# Patient Record
Sex: Female | Born: 1970 | Race: Black or African American | Hispanic: No | Marital: Married | Smoking: Former smoker
Health system: Southern US, Community
[De-identification: ages and names within clinical notes are randomized; demographics above are authoritative.]

## PROBLEM LIST (undated history)

## (undated) ENCOUNTER — Encounter

## (undated) ENCOUNTER — Ambulatory Visit

## (undated) ENCOUNTER — Encounter: Attending: Infectious Disease | Primary: Infectious Disease

## (undated) ENCOUNTER — Ambulatory Visit: Payer: MEDICARE

## (undated) ENCOUNTER — Ambulatory Visit: Payer: Medicare (Managed Care) | Attending: Registered" | Primary: Registered"

## (undated) ENCOUNTER — Encounter: Attending: Registered" | Primary: Registered"

## (undated) ENCOUNTER — Encounter
Attending: Pharmacist Clinician (PhC)/ Clinical Pharmacy Specialist | Primary: Pharmacist Clinician (PhC)/ Clinical Pharmacy Specialist

## (undated) ENCOUNTER — Ambulatory Visit: Payer: Medicare (Managed Care)

## (undated) ENCOUNTER — Telehealth

## (undated) ENCOUNTER — Encounter: Payer: Medicare (Managed Care) | Attending: Neurology | Primary: Neurology

## (undated) ENCOUNTER — Ambulatory Visit: Payer: BLUE CROSS/BLUE SHIELD

## (undated) ENCOUNTER — Inpatient Hospital Stay: Payer: Medicare (Managed Care)

## (undated) ENCOUNTER — Encounter: Attending: Neurology | Primary: Neurology

## (undated) ENCOUNTER — Ambulatory Visit: Payer: Medicare (Managed Care) | Attending: Sports Medicine | Primary: Sports Medicine

## (undated) ENCOUNTER — Ambulatory Visit: Attending: Sports Medicine | Primary: Sports Medicine

## (undated) ENCOUNTER — Ambulatory Visit: Payer: Medicare (Managed Care) | Attending: Infectious Disease | Primary: Infectious Disease

## (undated) ENCOUNTER — Ambulatory Visit: Payer: BLUE CROSS/BLUE SHIELD | Attending: Family Medicine | Primary: Family Medicine

## (undated) ENCOUNTER — Encounter: Attending: Sports Medicine | Primary: Sports Medicine

## (undated) ENCOUNTER — Ambulatory Visit
Payer: Medicare (Managed Care) | Attending: Pharmacist Clinician (PhC)/ Clinical Pharmacy Specialist | Primary: Pharmacist Clinician (PhC)/ Clinical Pharmacy Specialist

## (undated) ENCOUNTER — Telehealth: Attending: Registered" | Primary: Registered"

## (undated) ENCOUNTER — Ambulatory Visit: Payer: BLUE CROSS/BLUE SHIELD | Attending: Family | Primary: Family

## (undated) ENCOUNTER — Ambulatory Visit: Attending: Family | Primary: Family

## (undated) DIAGNOSIS — Z21 Asymptomatic human immunodeficiency virus [HIV] infection status: Secondary | ICD-10-CM

## (undated) DIAGNOSIS — K859 Acute pancreatitis without necrosis or infection, unspecified: Secondary | ICD-10-CM

## (undated) DIAGNOSIS — F319 Bipolar disorder, unspecified: Secondary | ICD-10-CM

## (undated) DIAGNOSIS — C801 Malignant (primary) neoplasm, unspecified: Secondary | ICD-10-CM

## (undated) DIAGNOSIS — J302 Other seasonal allergic rhinitis: Secondary | ICD-10-CM

## (undated) DIAGNOSIS — F419 Anxiety disorder, unspecified: Secondary | ICD-10-CM

## (undated) DIAGNOSIS — K219 Gastro-esophageal reflux disease without esophagitis: Secondary | ICD-10-CM

## (undated) DIAGNOSIS — R519 Headache, unspecified: Secondary | ICD-10-CM

## (undated) DIAGNOSIS — M1990 Unspecified osteoarthritis, unspecified site: Secondary | ICD-10-CM

## (undated) DIAGNOSIS — D649 Anemia, unspecified: Secondary | ICD-10-CM

## (undated) DIAGNOSIS — K759 Inflammatory liver disease, unspecified: Secondary | ICD-10-CM

## (undated) DIAGNOSIS — G709 Myoneural disorder, unspecified: Secondary | ICD-10-CM

## (undated) DIAGNOSIS — G43909 Migraine, unspecified, not intractable, without status migrainosus: Secondary | ICD-10-CM

## (undated) DIAGNOSIS — R0600 Dyspnea, unspecified: Secondary | ICD-10-CM

## (undated) DIAGNOSIS — L309 Dermatitis, unspecified: Secondary | ICD-10-CM

## (undated) DIAGNOSIS — R51 Headache: Secondary | ICD-10-CM

## (undated) DIAGNOSIS — N809 Endometriosis, unspecified: Secondary | ICD-10-CM

## (undated) DIAGNOSIS — D219 Benign neoplasm of connective and other soft tissue, unspecified: Secondary | ICD-10-CM

## (undated) DIAGNOSIS — F329 Major depressive disorder, single episode, unspecified: Secondary | ICD-10-CM

## (undated) DIAGNOSIS — F32A Depression, unspecified: Secondary | ICD-10-CM

## (undated) DIAGNOSIS — J45909 Unspecified asthma, uncomplicated: Secondary | ICD-10-CM

## (undated) DIAGNOSIS — I10 Essential (primary) hypertension: Secondary | ICD-10-CM

## (undated) DIAGNOSIS — B20 Human immunodeficiency virus [HIV] disease: Secondary | ICD-10-CM

## (undated) HISTORY — PX: DILATION AND CURETTAGE OF UTERUS: SHX78

## (undated) HISTORY — DX: Dermatitis, unspecified: L30.9

## (undated) HISTORY — PX: OTHER SURGICAL HISTORY: SHX169

## (undated) HISTORY — PX: ABDOMINAL HYSTERECTOMY: SHX81

## (undated) HISTORY — DX: Asymptomatic human immunodeficiency virus (hiv) infection status: Z21

## (undated) HISTORY — PX: HAND SURGERY: SHX662

## (undated) HISTORY — PX: WISDOM TOOTH EXTRACTION: SHX21

## (undated) HISTORY — PX: MOUTH SURGERY: SHX715

## (undated) HISTORY — PX: TONSILLECTOMY: SUR1361

## (undated) HISTORY — DX: Human immunodeficiency virus (HIV) disease: B20

## (undated) SURGERY — Surgical Case
Anesthesia: *Unknown

---

## 2007-04-27 DIAGNOSIS — K759 Inflammatory liver disease, unspecified: Secondary | ICD-10-CM

## 2007-04-27 HISTORY — DX: Inflammatory liver disease, unspecified: K75.9

## 2011-03-10 DIAGNOSIS — B1920 Unspecified viral hepatitis C without hepatic coma: Secondary | ICD-10-CM | POA: Insufficient documentation

## 2011-03-10 DIAGNOSIS — B20 Human immunodeficiency virus [HIV] disease: Secondary | ICD-10-CM | POA: Insufficient documentation

## 2012-04-13 DIAGNOSIS — F319 Bipolar disorder, unspecified: Secondary | ICD-10-CM | POA: Insufficient documentation

## 2012-04-13 DIAGNOSIS — N809 Endometriosis, unspecified: Secondary | ICD-10-CM | POA: Insufficient documentation

## 2012-04-13 DIAGNOSIS — G43909 Migraine, unspecified, not intractable, without status migrainosus: Secondary | ICD-10-CM | POA: Insufficient documentation

## 2014-03-12 DIAGNOSIS — N939 Abnormal uterine and vaginal bleeding, unspecified: Secondary | ICD-10-CM | POA: Insufficient documentation

## 2014-03-25 DIAGNOSIS — N711 Chronic inflammatory disease of uterus: Secondary | ICD-10-CM | POA: Insufficient documentation

## 2014-03-25 DIAGNOSIS — D500 Iron deficiency anemia secondary to blood loss (chronic): Secondary | ICD-10-CM | POA: Insufficient documentation

## 2014-11-12 ENCOUNTER — Encounter: Payer: Self-pay | Admitting: Medical Oncology

## 2014-11-12 ENCOUNTER — Emergency Department
Admission: EM | Admit: 2014-11-12 | Discharge: 2014-11-12 | Disposition: A | Discharge status: Home / Self Care | Payer: Medicaid Other | Attending: Emergency Medicine | Admitting: Emergency Medicine

## 2014-11-12 DIAGNOSIS — D649 Anemia, unspecified: Secondary | ICD-10-CM | POA: Diagnosis not present

## 2014-11-12 DIAGNOSIS — Z3202 Encounter for pregnancy test, result negative: Secondary | ICD-10-CM | POA: Diagnosis not present

## 2014-11-12 DIAGNOSIS — N946 Dysmenorrhea, unspecified: Secondary | ICD-10-CM | POA: Insufficient documentation

## 2014-11-12 DIAGNOSIS — Z720 Tobacco use: Secondary | ICD-10-CM | POA: Insufficient documentation

## 2014-11-12 DIAGNOSIS — N938 Other specified abnormal uterine and vaginal bleeding: Secondary | ICD-10-CM | POA: Diagnosis present

## 2014-11-12 DIAGNOSIS — N390 Urinary tract infection, site not specified: Secondary | ICD-10-CM | POA: Insufficient documentation

## 2014-11-12 HISTORY — DX: Benign neoplasm of connective and other soft tissue, unspecified: D21.9

## 2014-11-12 LAB — URINALYSIS COMPLETE WITH MICROSCOPIC (ARMC ONLY)
Bacteria, UA: NONE SEEN
Specific Gravity, Urine: 1.008 (ref 1.005–1.030)
Squamous Epithelial / LPF: NONE SEEN
pH: 0 — ABNORMAL LOW (ref 5.0–8.0)

## 2014-11-12 LAB — COMPREHENSIVE METABOLIC PANEL
ALT: 42 U/L (ref 14–54)
AST: 69 U/L — ABNORMAL HIGH (ref 15–41)
Albumin: 3.6 g/dL (ref 3.5–5.0)
Alkaline Phosphatase: 60 U/L (ref 38–126)
Anion gap: 5 (ref 5–15)
BUN: 9 mg/dL (ref 6–20)
CO2: 26 mmol/L (ref 22–32)
Calcium: 9.1 mg/dL (ref 8.9–10.3)
Chloride: 105 mmol/L (ref 101–111)
Creatinine, Ser: 0.81 mg/dL (ref 0.44–1.00)
GFR calc Af Amer: >60 mL/min (ref >60)
GFR calc non Af Amer: >60 mL/min (ref >60)
Glucose, Bld: 98 mg/dL (ref 65–99)
Potassium: 3.5 mmol/L (ref 3.5–5.1)
Sodium: 136 mmol/L (ref 135–145)
Total Bilirubin: 0.4 mg/dL (ref 0.3–1.2)
Total Protein: 7.9 g/dL (ref 6.5–8.1)

## 2014-11-12 LAB — CBC WITH DIFFERENTIAL/PLATELET
Basophils Absolute: 0 10*3/uL (ref 0–0.1)
Basophils Relative: 1 %
Eosinophils Absolute: 0 10*3/uL (ref 0–0.7)
Eosinophils Relative: 1 %
HCT: 29.1 % — ABNORMAL LOW (ref 35.0–47.0)
Hemoglobin: 9 g/dL — ABNORMAL LOW (ref 12.0–16.0)
Lymphocytes Relative: 34 %
Lymphs Abs: 1.2 10*3/uL (ref 1.0–3.6)
MCH: 22.2 pg — ABNORMAL LOW (ref 26.0–34.0)
MCHC: 31 g/dL — ABNORMAL LOW (ref 32.0–36.0)
MCV: 71.5 fL — ABNORMAL LOW (ref 80.0–100.0)
Monocytes Absolute: 0.3 10*3/uL (ref 0.2–0.9)
Monocytes Relative: 10 %
Neutro Abs: 1.9 10*3/uL (ref 1.4–6.5)
Neutrophils Relative %: 54 %
Platelets: 174 10*3/uL (ref 150–440)
RBC: 4.08 MIL/uL (ref 3.80–5.20)
RDW: 16.7 % — ABNORMAL HIGH (ref 11.5–14.5)
WBC: 3.4 10*3/uL — ABNORMAL LOW (ref 3.6–11.0)

## 2014-11-12 LAB — PREGNANCY, URINE: Preg Test, Ur: NEGATIVE

## 2014-11-12 MED ORDER — MORPHINE SULFATE 4 MG/ML IJ SOLN
4.0000 mg | Freq: Once | INTRAMUSCULAR | Status: AC
  Administered 2014-11-12: 4 mg via INTRAVENOUS
  Filled 2014-11-12: qty 1

## 2014-11-12 MED ORDER — TRAMADOL HCL 50 MG PO TABS: 50.0000 mg | ORAL_TABLET | Freq: Four times a day (QID) | ORAL | Status: DC | PRN

## 2014-11-12 MED ORDER — SODIUM CHLORIDE 0.9 % IV SOLN
Freq: Once | INTRAVENOUS | Status: AC
  Administered 2014-11-12: 11:00:00 via INTRAVENOUS

## 2014-11-12 MED ORDER — KETOROLAC TROMETHAMINE 30 MG/ML IJ SOLN
30.0000 mg | Freq: Once | INTRAMUSCULAR | Status: AC
  Administered 2014-11-12: 30 mg via INTRAVENOUS
  Filled 2014-11-12: qty 1

## 2014-11-12 MED ORDER — SULFAMETHOXAZOLE-TRIMETHOPRIM 800-160 MG PO TABS: 1.0000 | ORAL_TABLET | Freq: Two times a day (BID) | ORAL | Status: DC

## 2014-11-12 MED ORDER — FERROUS SULFATE DRIED ER 160 (50 FE) MG PO TBCR: 160.0000 mg | EXTENDED_RELEASE_TABLET | Freq: Every day | ORAL | Status: DC

## 2014-11-12 MED ORDER — MEDROXYPROGESTERONE ACETATE 10 MG PO TABS: 10.0000 mg | ORAL_TABLET | Freq: Every day | ORAL | Status: DC

## 2014-11-12 NOTE — Discharge Instructions (Signed)
Urinary Tract Infection Urinary tract infections (UTIs) can develop anywhere along your urinary tract. Your urinary tract is your body's drainage system for removing wastes and extra water. Your urinary tract includes two kidneys, two ureters, a bladder, and a urethra. Your kidneys are a pair of bean-shaped organs. Each kidney is about the size of your fist. They are located below your ribs, one on each side of your spine. CAUSES Infections are caused by microbes, which are microscopic organisms, including fungi, viruses, and bacteria. These organisms are so small that they can only be seen through a microscope. Bacteria are the microbes that most commonly cause UTIs. SYMPTOMS  Symptoms of UTIs may vary by age and gender of the patient and by the location of the infection. Symptoms in young women typically include a frequent and intense urge to urinate and a painful, burning feeling in the bladder or urethra during urination. Older women and men are more likely to be tired, shaky, and weak and have muscle aches and abdominal pain. A fever may mean the infection is in your kidneys. Other symptoms of a kidney infection include pain in your back or sides below the ribs, nausea, and vomiting. DIAGNOSIS To diagnose a UTI, your caregiver will ask you about your symptoms. Your caregiver also will ask to provide a urine sample. The urine sample will be tested for bacteria and white blood cells. White blood cells are made by your body to help fight infection. TREATMENT  Typically, UTIs can be treated with medication. Because most UTIs are caused by a bacterial infection, they usually can be treated with the use of antibiotics. The choice of antibiotic and length of treatment depend on your symptoms and the type of bacteria causing your infection. HOME CARE INSTRUCTIONS  If you were prescribed antibiotics, take them exactly as your caregiver instructs you. Finish the medication even if you feel better after you  have only taken some of the medication.  Drink enough water and fluids to keep your urine clear or pale yellow.  Avoid caffeine, tea, and carbonated beverages. They tend to irritate your bladder.  Empty your bladder often. Avoid holding urine for long periods of time.  Empty your bladder before and after sexual intercourse.  After a bowel movement, women should cleanse from front to back. Use each tissue only once. SEEK MEDICAL CARE IF:   You have back pain.  You develop a fever.  Your symptoms do not begin to resolve within 3 days. SEEK IMMEDIATE MEDICAL CARE IF:   You have severe back pain or lower abdominal pain.  You develop chills.  You have nausea or vomiting.  You have continued burning or discomfort with urination. MAKE SURE YOU:   Understand these instructions.  Will watch your condition.  Will get help right away if you are not doing well or get worse. Document Released: 01/20/2005 Document Revised: 10/12/2011 Document Reviewed: 05/21/2011 [facility] Patient Information 2015 [place], [place]. This information is not intended to replace advice given to you by your health care provider. Make sure you discuss any questions you have with your health care provider. Anemia, Nonspecific Anemia is a condition in which the concentration of red blood cells or hemoglobin in the blood is below normal. Hemoglobin is a substance in red blood cells that carries oxygen to the tissues of the body. Anemia results in not enough oxygen reaching these tissues.  CAUSES  Common causes of anemia include:   Excessive bleeding. Bleeding may be internal or external. This  includes excessive bleeding from periods (in women) or from the intestine.   Poor nutrition.   Chronic kidney, thyroid, and liver disease.  Bone marrow disorders that decrease red blood cell production.  Cancer and treatments for cancer.  HIV, AIDS, and their treatments.  Spleen problems that increase red blood  cell destruction.  Blood disorders.  Excess destruction of red blood cells due to infection, medicines, and autoimmune disorders. SIGNS AND SYMPTOMS   Minor weakness.   Dizziness.   Headache.  Palpitations.   Shortness of breath, especially with exercise.   Paleness.  Cold sensitivity.  Indigestion.  Nausea.  Difficulty sleeping.  Difficulty concentrating. Symptoms may occur suddenly or they may develop slowly.  DIAGNOSIS  Additional blood tests are often needed. These help your health care provider determine the best treatment. Your health care provider will check your stool for blood and look for other causes of blood loss.  TREATMENT  Treatment varies depending on the cause of the anemia. Treatment can include:   Supplements of iron, vitamin J67, or folic acid.   Hormone medicines.   A blood transfusion. This may be needed if blood loss is severe.   Hospitalization. This may be needed if there is significant continual blood loss.   Dietary changes.  Spleen removal. HOME CARE INSTRUCTIONS Keep all follow-up appointments. It often takes many weeks to correct anemia, and having your health care provider check on your condition and your response to treatment is very important. SEEK IMMEDIATE MEDICAL CARE IF:   You develop extreme weakness, shortness of breath, or chest pain.   You become dizzy or have trouble concentrating.  You develop heavy vaginal bleeding.   You develop a rash.   You have bloody or black, tarry stools.   You faint.   You vomit up blood.   You vomit repeatedly.   You have abdominal pain.  You have a fever or persistent symptoms for more than 2-3 days.   You have a fever and your symptoms suddenly get worse.   You are dehydrated.  MAKE SURE YOU:  Understand these instructions.  Will watch your condition.  Will get help right away if you are not doing well or get worse. Document Released: 05/20/2004  Document Revised: 12/13/2012 Document Reviewed: 10/06/2012 [facility] Patient Information 2015 [place], [place]. This information is not intended to replace advice given to you by your health care provider. Make sure you discuss any questions you have with your health care provider. Dysmenorrhea Menstrual cramps (dysmenorrhea) are caused by the muscles of the uterus tightening (contracting) during a menstrual period. For some women, this discomfort is merely bothersome. For others, dysmenorrhea can be severe enough to interfere with everyday activities for a few days each month. Primary dysmenorrhea is menstrual cramps that last a couple of days when you start having menstrual periods or soon after. This often begins after a teenager starts having her period. As a woman gets older or has a baby, the cramps will usually lessen or disappear. Secondary dysmenorrhea begins later in life, lasts longer, and the pain may be stronger than primary dysmenorrhea. The pain may start before the period and last a few days after the period.  CAUSES  Dysmenorrhea is usually caused by an underlying problem, such as:  The tissue lining the uterus grows outside of the uterus in other areas of the body (endometriosis).  The endometrial tissue, which normally lines the uterus, is found in or grows into the muscular walls of the uterus (adenomyosis).  The pelvic blood vessels are engorged with blood just before the menstrual period (pelvic congestive syndrome).  Overgrowth of cells (polyps) in the lining of the uterus or cervix.  Falling down of the uterus (prolapse) because of loose or stretched ligaments.  Depression.  Bladder problems, infection, or inflammation.  Problems with the intestine, a tumor, or irritable bowel syndrome.  Cancer of the female organs or bladder.  A severely tipped uterus.  A very tight opening or closed cervix.  Noncancerous tumors of the uterus (fibroids).  Pelvic inflammatory  disease (PID).  Pelvic scarring (adhesions) from a previous surgery.  Ovarian cyst.  An intrauterine device (IUD) used for birth control. RISK FACTORS You may be at greater risk of dysmenorrhea if:  You are younger than age 57.  You started puberty early.  You have irregular or heavy bleeding.  You have never given birth.  You have a family history of this problem.  You are a smoker. SIGNS AND SYMPTOMS   Cramping or throbbing pain in your lower abdomen.  Headaches.  Lower back pain.  Nausea or vomiting.  Diarrhea.  Sweating or dizziness.  Loose stools. DIAGNOSIS  A diagnosis is based on your history, symptoms, physical exam, diagnostic tests, or procedures. Diagnostic tests or procedures may include:  Blood tests.  Ultrasonography.  An examination of the lining of the uterus (dilation and curettage, D&C).  An examination inside your abdomen or pelvis with a scope (laparoscopy).  X-rays.  CT scan.  MRI.  An examination inside the bladder with a scope (cystoscopy).  An examination inside the intestine or stomach with a scope (colonoscopy, gastroscopy). TREATMENT  Treatment depends on the cause of the dysmenorrhea. Treatment may include:  Pain medicine prescribed by your health care provider.  Birth control pills or an IUD with progesterone hormone in it.  Hormone replacement therapy.  Nonsteroidal anti-inflammatory drugs (NSAIDs). These may help stop the production of prostaglandins.  Surgery to remove adhesions, endometriosis, ovarian cyst, or fibroids.  Removal of the uterus (hysterectomy).  Progesterone shots to stop the menstrual period.  Cutting the nerves on the sacrum that go to the female organs (presacral neurectomy).  Electric current to the sacral nerves (sacral nerve stimulation).  Antidepressant medicine.  Psychiatric therapy, counseling, or group therapy.  Exercise and physical therapy.  Meditation and yoga  therapy.  Acupuncture. HOME CARE INSTRUCTIONS   Only take over-the-counter or prescription medicines as directed by your health care provider.  Place a heating pad or hot water bottle on your lower back or abdomen. Do not sleep with the heating pad.  Use aerobic exercises, walking, swimming, biking, and other exercises to help lessen the cramping.  Massage to the lower back or abdomen may help.  Stop smoking.  Avoid alcohol and caffeine. SEEK MEDICAL CARE IF:   Your pain does not get better with medicine.  You have pain with sexual intercourse.  Your pain increases and is not controlled with medicines.  You have abnormal vaginal bleeding with your period.  You develop nausea or vomiting with your period that is not controlled with medicine. SEEK IMMEDIATE MEDICAL CARE IF:  You pass out.  Document Released: 04/12/2005 Document Revised: 12/13/2012 Document Reviewed: 09/28/2012 [facility] Patient Information 2015 [place], [place]. This information is not intended to replace advice given to you by your health care provider. Make sure you discuss any questions you have with your health care provider.

## 2014-11-12 NOTE — ED Notes (Signed)
Pt ambulatory to triage with reports that she has a history of fibroids, 2 weeks ago pt began having lower abd cramping and vaginal bleeding that has continued to worsen.

## 2014-11-12 NOTE — ED Provider Notes (Signed)
[facility] Emergency Department Provider Note     Time seen: ----------------------------------------- 9:37 AM on 11/12/2014 -----------------------------------------    I have reviewed the triage vital signs and the nursing notes.   HISTORY  Chief Complaint Abdominal Pain and Vaginal Bleeding    HPI Teresa [name] is a 44 y.o. female who presents to ER for heavy vaginal bleeding and cramping for the last 11 days. Patient reports he doesn't history of uterine fibroids, every few months has severe cramping and bleeding. She was post of a hysterectomy but she lost her insurance. She reports she is anemic and is taking iron, denies any fevers chills other complaints. She presents today due to worsening pain in the pelvic area, worse on the right.   Past Medical History  Diagnosis Date  . Fibroids     There are no active problems to display for this patient.   Past Surgical History  Procedure Laterality Date  . Cesarean section      Allergies Naproxen sodium  Social History History  Substance Use Topics  . Smoking status: Current Every Day Smoker  . Smokeless tobacco: Not on file  . Alcohol Use: Yes     Comment: occ    Review of Systems Constitutional: Negative for fever. Eyes: Negative for visual changes. ENT: Negative for sore throat. Cardiovascular: Negative for chest pain. Respiratory: Negative for shortness of breath. Gastrointestinal: Positive for pelvic cramping Genitourinary: Negative for dysuria. Positive for heavy vaginal bleeding Musculoskeletal: Negative for back pain. Skin: Negative for rash. Neurological: Negative for headaches, focal weakness or numbness.  10-point ROS otherwise negative.  ____________________________________________   PHYSICAL EXAM:  VITAL SIGNS: ED Triage Vitals  Enc Vitals Group     BP 11/12/14 0932 136/87 mmHg     Pulse Rate 11/12/14 0932 84     Resp 11/12/14 0932 18     Temp 11/12/14 0932  97.8 F (36.6 C)     Temp Source 11/12/14 0932 Oral     SpO2 11/12/14 0932 99 %     Weight 11/12/14 0932 150 lb (68.04 kg)     Height 11/12/14 0932 5\' 6"  (1.676 m)     Head Cir --      Peak Flow --      Pain Score 11/12/14 0933 8     Pain Loc --      Pain Edu? --      Excl. in [place]? --     Constitutional: Alert and oriented. Mild distress due to pain Eyes: Conjunctivae are normal. PERRL. Normal extraocular movements. ENT   Head: Normocephalic and atraumatic.   Nose: No congestion/rhinnorhea.   Mouth/Throat: Mucous membranes are moist.   Neck: No stridor. Hematological/Lymphatic/Immunilogical: No cervical lymphadenopathy. Cardiovascular: Normal rate, regular rhythm. Normal and symmetric distal pulses are present in all extremities. No murmurs, rubs, or gallops. Respiratory: Normal respiratory effort without tachypnea nor retractions. Breath sounds are clear and equal bilaterally. No wheezes/rales/rhonchi. Gastrointestinal: Soft and nontender. No distention. No abdominal bruits. There is no CVA tenderness. Musculoskeletal: Nontender with normal range of motion in all extremities. No joint effusions.  No lower extremity tenderness nor edema. Neurologic:  Normal speech and language. No gross focal neurologic deficits are appreciated. Speech is normal. No gait instability. Skin:  Skin is warm, dry and intact. No rash noted. Psychiatric: Mood and affect are normal. Speech and behavior are normal. Patient exhibits appropriate insight and judgment.  ED COURSE:  Pertinent labs & imaging results that were available during my care  of the patient were reviewed by me and considered in my medical decision making (see chart for details). Patient with dysmenorrhea, likely secondary to uterine fibroids. She'll receive IV pain medicine, fluids and reevaluation. ____________________________________________    LABS (pertinent positives/negatives)  Labs Reviewed  CBC WITH  DIFFERENTIAL/PLATELET - Abnormal; Notable for the following:    WBC 3.4 (*)    Hemoglobin 9.0 (*)    HCT 29.1 (*)    MCV 71.5 (*)    MCH 22.2 (*)    MCHC 31.0 (*)    RDW 16.7 (*)    All other components within normal limits  COMPREHENSIVE METABOLIC PANEL - Abnormal; Notable for the following:    AST 69 (*)    All other components within normal limits  URINALYSIS COMPLETEWITH MICROSCOPIC ([facility] ONLY) - Abnormal; Notable for the following:    Color, Urine RED (*)    APPearance CLOUDY (*)    Glucose, UA   (*)    Value: TEST NOT REPORTED DUE TO COLOR INTERFERENCE OF URINE PIGMENT   Bilirubin Urine   (*)    Value: TEST NOT REPORTED DUE TO COLOR INTERFERENCE OF URINE PIGMENT   Ketones, ur   (*)    Value: TEST NOT REPORTED DUE TO COLOR INTERFERENCE OF URINE PIGMENT   Hgb urine dipstick   (*)    Value: TEST NOT REPORTED DUE TO COLOR INTERFERENCE OF URINE PIGMENT   pH 0.0 (*)    Protein, ur   (*)    Value: TEST NOT REPORTED DUE TO COLOR INTERFERENCE OF URINE PIGMENT   Nitrite   (*)    Value: TEST NOT REPORTED DUE TO COLOR INTERFERENCE OF URINE PIGMENT   Leukocytes, UA   (*)    Value: TEST NOT REPORTED DUE TO COLOR INTERFERENCE OF URINE PIGMENT   All other components within normal limits  PREGNANCY, URINE  HCG, QUANTITATIVE, PREGNANCY   ____________________________________________  FINAL ASSESSMENT AND PLAN  Dysmenorrhea, anemia, UTI  Plan: Patient with labs and imaging as dictated above. Patient is feeling better, she does have evidence of a mild UTI. She's can be discharged with slow iron, Septra for 3 days and Provera. She is encouraged to return for worsening or worrisome symptoms.   Earleen Newport, MD   Earleen Newport, MD 11/12/14 [phone]

## 2014-11-12 NOTE — ED Notes (Signed)
C/o low abd pain vag bleeding past several days, worse now

## 2015-04-27 DIAGNOSIS — I10 Essential (primary) hypertension: Secondary | ICD-10-CM

## 2015-04-27 HISTORY — DX: Essential (primary) hypertension: I10

## 2015-05-15 ENCOUNTER — Emergency Department (HOSPITAL_COMMUNITY): Payer: Medicaid Other

## 2015-05-15 ENCOUNTER — Encounter (HOSPITAL_COMMUNITY): Payer: Self-pay | Admitting: Emergency Medicine

## 2015-05-15 ENCOUNTER — Emergency Department (HOSPITAL_COMMUNITY)
Admission: EM | Admit: 2015-05-15 | Discharge: 2015-05-15 | Disposition: A | Discharge status: Home / Self Care | Payer: Medicaid Other | Attending: Emergency Medicine | Admitting: Emergency Medicine

## 2015-05-15 DIAGNOSIS — Z3202 Encounter for pregnancy test, result negative: Secondary | ICD-10-CM | POA: Diagnosis not present

## 2015-05-15 DIAGNOSIS — N939 Abnormal uterine and vaginal bleeding, unspecified: Secondary | ICD-10-CM

## 2015-05-15 DIAGNOSIS — N809 Endometriosis, unspecified: Secondary | ICD-10-CM | POA: Insufficient documentation

## 2015-05-15 DIAGNOSIS — Z79899 Other long term (current) drug therapy: Secondary | ICD-10-CM | POA: Diagnosis not present

## 2015-05-15 DIAGNOSIS — F17200 Nicotine dependence, unspecified, uncomplicated: Secondary | ICD-10-CM | POA: Insufficient documentation

## 2015-05-15 DIAGNOSIS — D259 Leiomyoma of uterus, unspecified: Secondary | ICD-10-CM | POA: Insufficient documentation

## 2015-05-15 DIAGNOSIS — R109 Unspecified abdominal pain: Secondary | ICD-10-CM | POA: Diagnosis present

## 2015-05-15 HISTORY — DX: Endometriosis, unspecified: N80.9

## 2015-05-15 LAB — CBC WITH DIFFERENTIAL/PLATELET
Basophils Absolute: 0 10*3/uL (ref 0.0–0.1)
Basophils Relative: 2 %
Eosinophils Absolute: 0 10*3/uL (ref 0.0–0.7)
Eosinophils Relative: 1 %
HCT: 33.6 % — ABNORMAL LOW (ref 36.0–46.0)
Hemoglobin: 10.5 g/dL — ABNORMAL LOW (ref 12.0–15.0)
Lymphocytes Relative: 59 %
Lymphs Abs: 1.5 10*3/uL (ref 0.7–4.0)
MCH: 21.9 pg — ABNORMAL LOW (ref 26.0–34.0)
MCHC: 31.3 g/dL (ref 30.0–36.0)
MCV: 70.1 fL — ABNORMAL LOW (ref 78.0–100.0)
Monocytes Absolute: 0.2 10*3/uL (ref 0.1–1.0)
Monocytes Relative: 10 %
Neutro Abs: 0.7 10*3/uL — ABNORMAL LOW (ref 1.7–7.7)
Neutrophils Relative %: 28 %
Platelets: 200 10*3/uL (ref 150–400)
RBC: 4.79 MIL/uL (ref 3.87–5.11)
RDW: 22.5 % — ABNORMAL HIGH (ref 11.5–15.5)
WBC: 2.4 10*3/uL — ABNORMAL LOW (ref 4.0–10.5)

## 2015-05-15 LAB — WET PREP, GENITAL
Sperm: NONE SEEN
Trich, Wet Prep: NONE SEEN
Yeast Wet Prep HPF POC: NONE SEEN

## 2015-05-15 LAB — BASIC METABOLIC PANEL
Anion gap: 7 (ref 5–15)
BUN: <5 mg/dL — ABNORMAL LOW (ref 6–20)
CO2: 24 mmol/L (ref 22–32)
Calcium: 8.7 mg/dL — ABNORMAL LOW (ref 8.9–10.3)
Chloride: 108 mmol/L (ref 101–111)
Creatinine, Ser: 0.67 mg/dL (ref 0.44–1.00)
GFR calc Af Amer: >60 mL/min (ref >60)
GFR calc non Af Amer: >60 mL/min (ref >60)
Glucose, Bld: 98 mg/dL (ref 65–99)
Potassium: 3.8 mmol/L (ref 3.5–5.1)
Sodium: 139 mmol/L (ref 135–145)

## 2015-05-15 LAB — URINALYSIS, ROUTINE W REFLEX MICROSCOPIC
Bilirubin Urine: NEGATIVE
Glucose, UA: NEGATIVE mg/dL
Ketones, ur: NEGATIVE mg/dL
Leukocytes, UA: NEGATIVE
Nitrite: NEGATIVE
Protein, ur: NEGATIVE mg/dL
Specific Gravity, Urine: 1.014 (ref 1.005–1.030)
pH: 6.5 (ref 5.0–8.0)

## 2015-05-15 LAB — URINE MICROSCOPIC-ADD ON: RBC / HPF: NONE SEEN RBC/hpf (ref 0–5)

## 2015-05-15 LAB — I-STAT BETA HCG BLOOD, ED (MC, WL, AP ONLY): I-stat hCG, quantitative: <5 m[IU]/mL (ref <5)

## 2015-05-15 LAB — PATHOLOGIST SMEAR REVIEW

## 2015-05-15 MED ORDER — OXYCODONE-ACETAMINOPHEN 5-325 MG PO TABS
ORAL_TABLET | ORAL | Status: AC
  Filled 2015-05-15: qty 1

## 2015-05-15 MED ORDER — IBUPROFEN 600 MG PO TABS: 600.0000 mg | ORAL_TABLET | Freq: Three times a day (TID) | ORAL | Status: DC | PRN

## 2015-05-15 MED ORDER — OXYCODONE-ACETAMINOPHEN 5-325 MG PO TABS
1.0000 | ORAL_TABLET | Freq: Once | ORAL | Status: AC
  Administered 2015-05-15: 1 via ORAL

## 2015-05-15 NOTE — ED Notes (Signed)
[place] offered, pt refused.

## 2015-05-15 NOTE — ED Notes (Signed)
Pt sts lower abd pain with cramping; pt sts hx of same due to endometriosis; pt sts diarrhea today as well

## 2015-05-15 NOTE — Discharge Instructions (Signed)

## 2015-05-15 NOTE — ED Notes (Signed)
Pelvic exam performed by Dr. Tomi Bamberger and Lavella Lemons - EMT assisted

## 2015-05-15 NOTE — ED Provider Notes (Signed)
CSN: [id number]     Arrival date & time 05/15/15  0805 History   First MD Initiated Contact with Patient 05/15/15 [phone]     Chief Complaint  Patient presents with  . Abdominal Pain    HPI Pt has a history of endometriosis.  She started her menstrual period 11 days ago.  This morning she started passing heavy clots and experienced increased pain and cramping.  She had a fever 11 days ago none since.  No vomiting.  No trouble urinating.  Some nausea.  Appetite has not been as good the last few days.    She has sen a GYN doctor in the past for this but she recently moved to this area and does not have a GYN.  No prior appendectomy or cholecystectomy. Past Medical History  Diagnosis Date  . Fibroids   . Endometriosis    Past Surgical History  Procedure Laterality Date  . Cesarean section     History reviewed. No pertinent family history. Social History  Substance Use Topics  . Smoking status: Current Every Day Smoker  . Smokeless tobacco: None  . Alcohol Use: Yes     Comment: occ   OB History    No data available    No PCP in this area Review of Systems  All other systems reviewed and are negative.     Allergies  Naproxen sodium  Home Medications   Prior to Admission medications   Medication Sig Start Date End Date Taking? Authorizing Provider  ferrous sulfate (EQL SLOW RELEASE IRON) 160 (50 FE) MG TBCR SR tablet Take 1 tablet (160 mg total) by mouth daily. 11/12/14  Yes Earleen Newport, MD  ibuprofen (ADVIL,MOTRIN) 600 MG tablet Take 1 tablet (600 mg total) by mouth every 8 (eight) hours as needed. 05/15/15   Dorie Rank, MD   BP 162/103 mmHg  Pulse 63  Temp(Src) 98.1 F (36.7 C) (Oral)  Resp 20  SpO2 99% Physical Exam  Constitutional: She appears well-developed and well-nourished. No distress.  HENT:  Head: Normocephalic and atraumatic.  Right Ear: External ear normal.  Left Ear: External ear normal.  Eyes: Conjunctivae are normal. Right eye exhibits no  discharge. Left eye exhibits no discharge. No scleral icterus.  Neck: Neck supple. No tracheal deviation present.  Cardiovascular: Normal rate, regular rhythm and intact distal pulses.   Pulmonary/Chest: Effort normal and breath sounds normal. No stridor. No respiratory distress. She has no wheezes. She has no rales.  Abdominal: Soft. Bowel sounds are normal. She exhibits no distension. There is no tenderness. There is no rebound and no guarding.  Genitourinary: Uterus is enlarged and tender. Cervix exhibits no motion tenderness. Left adnexum displays mass and tenderness. There is bleeding in the vagina. No signs of injury around the vagina.  Musculoskeletal: She exhibits no edema or tenderness.  Neurological: She is alert. She has normal strength. No cranial nerve deficit (no facial droop, extraocular movements intact, no slurred speech) or sensory deficit. She exhibits normal muscle tone. She displays no seizure activity. Coordination normal.  Skin: Skin is warm and dry. No rash noted.  Psychiatric: She has a normal mood and affect.  Nursing note and vitals reviewed.   ED Course  Procedures (including critical care time) Labs Review Labs Reviewed  WET PREP, GENITAL - Abnormal; Notable for the following:    Clue Cells Wet Prep HPF POC PRESENT (*)    WBC, Wet Prep HPF POC MODERATE (*)    All other components within  normal limits  CBC WITH DIFFERENTIAL/PLATELET - Abnormal; Notable for the following:    WBC 2.4 (*)    Hemoglobin 10.5 (*)    HCT 33.6 (*)    MCV 70.1 (*)    MCH 21.9 (*)    RDW 22.5 (*)    Neutro Abs 0.7 (*)    All other components within normal limits  BASIC METABOLIC PANEL - Abnormal; Notable for the following:    BUN <5 (*)    Calcium 8.7 (*)    All other components within normal limits  URINALYSIS, ROUTINE W REFLEX MICROSCOPIC (NOT AT [facility]) - Abnormal; Notable for the following:    APPearance CLOUDY (*)    Hgb urine dipstick LARGE (*)    All other components  within normal limits  URINE MICROSCOPIC-ADD ON - Abnormal; Notable for the following:    Squamous Epithelial / LPF 0-5 (*)    Bacteria, UA FEW (*)    All other components within normal limits  RPR  HIV ANTIBODY (ROUTINE TESTING)  I-STAT BETA HCG BLOOD, ED ([facility], [facility], [facility] ONLY)  GC/CHLAMYDIA PROBE AMP ([place]) NOT AT [facility]    Imaging Review US Transvaginal Non-ob  05/15/2015  CLINICAL DATA:  Mid pelvic pain for the past 2 days with heavy vaginal bleeding. History of endometriosis. EXAM: TRANSABDOMINAL AND TRANSVAGINAL ULTRASOUND OF PELVIS TECHNIQUE: Both transabdominal and transvaginal ultrasound examinations of the pelvis were performed. Transabdominal technique was performed for global imaging of the pelvis including uterus, ovaries, adnexal regions, and pelvic cul-de-sac. It was necessary to proceed with endovaginal exam following the transabdominal exam to visualize the uterus and ovaries in better detail. COMPARISON:  None FINDINGS: Uterus Measurements: 9.4 x 6.9 x 6.4 cm. The uterus is heterogeneous with mildly lobulated contours. There is at least 1 distinct, poorly visualized mass, measuring 1.9 x 1.6 x 1.6 cm in the posterior aspect of the myometrium, without a submucosal component. There are probable other, poorly defined masses in the uterus. Endometrium Thickness: 14.3 mm.  Mildly diffusely hypoechoic. Right ovary Measurements: 2.6 x 2.0 x 1.7 cm. 1.5 x 1.5 x 1.4 cm cyst containing mildly thickened internal septations no associated Doppler images are included. Left ovary Measurements: 3.2 x 1.8 x 1.6 cm. Possible 1.4 x 1.3 x 0.9 cm oval, solid appearing mass without internal blood flow with color Doppler. Other findings Small amount of free peritoneal fluid. IMPRESSION: 1. Fibroid uterus with no visible submucosal fibroids. 2. Prominent, hypoechoic endometrium. This could be related to the current vaginal bleeding or endometrial polyp. 3. 1.4 x 1.3 x 0.9 cm left ovarian solid mass or nodular  component of the ovary. An endometrioma is a possibility. This could be further evaluated with a repeat pelvic ultrasound in 3 months. 4. 1.5 cm mildly complicated right ovarian cyst. This is most likely a hemorrhagic cyst or endometrioma. 5. Small amount of free peritoneal fluid. Electronically Signed   By: Claudie Revering M.D.   On: 05/15/2015 12:39   US Pelvis Complete  05/15/2015  CLINICAL DATA:  Mid pelvic pain for the past 2 days with heavy vaginal bleeding. History of endometriosis. EXAM: TRANSABDOMINAL AND TRANSVAGINAL ULTRASOUND OF PELVIS TECHNIQUE: Both transabdominal and transvaginal ultrasound examinations of the pelvis were performed. Transabdominal technique was performed for global imaging of the pelvis including uterus, ovaries, adnexal regions, and pelvic cul-de-sac. It was necessary to proceed with endovaginal exam following the transabdominal exam to visualize the uterus and ovaries in better detail. COMPARISON:  None FINDINGS: Uterus Measurements: 9.4 x  6.9 x 6.4 cm. The uterus is heterogeneous with mildly lobulated contours. There is at least 1 distinct, poorly visualized mass, measuring 1.9 x 1.6 x 1.6 cm in the posterior aspect of the myometrium, without a submucosal component. There are probable other, poorly defined masses in the uterus. Endometrium Thickness: 14.3 mm.  Mildly diffusely hypoechoic. Right ovary Measurements: 2.6 x 2.0 x 1.7 cm. 1.5 x 1.5 x 1.4 cm cyst containing mildly thickened internal septations no associated Doppler images are included. Left ovary Measurements: 3.2 x 1.8 x 1.6 cm. Possible 1.4 x 1.3 x 0.9 cm oval, solid appearing mass without internal blood flow with color Doppler. Other findings Small amount of free peritoneal fluid. IMPRESSION: 1. Fibroid uterus with no visible submucosal fibroids. 2. Prominent, hypoechoic endometrium. This could be related to the current vaginal bleeding or endometrial polyp. 3. 1.4 x 1.3 x 0.9 cm left ovarian solid mass or nodular  component of the ovary. An endometrioma is a possibility. This could be further evaluated with a repeat pelvic ultrasound in 3 months. 4. 1.5 cm mildly complicated right ovarian cyst. This is most likely a hemorrhagic cyst or endometrioma. 5. Small amount of free peritoneal fluid. Electronically Signed   By: Claudie Revering M.D.   On: 05/15/2015 12:39   I have personally reviewed and evaluated these images and lab results as part of my medical decision-making.   EKG Interpretation None      MDM   Final diagnoses:  Endometriosis  Uterine leiomyoma, unspecified location   Stable anemia.  Electrolytes normal.  Clue cells anc WBC on wet prep however pt is asymptomatic, no odor or discharge.  Doubt bacterial vaginosis.  Pelvic cx sent off for analysis but I suspect her sx are related to the ovarian mass and fibroids.  The patient has been told in the past to consider surgery. Patient has never done because of financial and insurance reasons. I think she is stable to follow up with a gynecologist. I will give her a referral to the [facility] clinic. Patient will be prescribed NSAIDs. Naprosyn allergy but she can take ibuprofen.    Dorie Rank, MD 05/15/15 [phone]

## 2015-05-16 LAB — HIV 1/2 AB DIFFERENTIATION
HIV 1 Ab: POSITIVE — AB
HIV 2 Ab: NEGATIVE

## 2015-05-16 LAB — GC/CHLAMYDIA PROBE AMP (~~LOC~~) NOT AT ARMC
Chlamydia: NEGATIVE
Neisseria Gonorrhea: NEGATIVE

## 2015-05-16 LAB — HIV ANTIBODY (ROUTINE TESTING W REFLEX)

## 2015-05-16 LAB — RPR: RPR Ser Ql: NONREACTIVE

## 2015-05-19 ENCOUNTER — Telehealth: Payer: Self-pay | Admitting: Infectious Diseases

## 2015-05-19 DIAGNOSIS — B20 Human immunodeficiency virus [HIV] disease: Secondary | ICD-10-CM

## 2015-05-19 DIAGNOSIS — Z113 Encounter for screening for infections with a predominantly sexual mode of transmission: Secondary | ICD-10-CM

## 2015-05-19 DIAGNOSIS — Z79899 Other long term (current) drug therapy: Secondary | ICD-10-CM

## 2015-05-19 NOTE — Telephone Encounter (Signed)
Called pt and asked her to come to to [facility] for repeat testing.  She will come 1-24

## 2015-05-20 ENCOUNTER — Ambulatory Visit: Payer: Medicaid Other

## 2015-05-23 ENCOUNTER — Ambulatory Visit: Payer: Medicaid Other | Admitting: Infectious Diseases

## 2015-05-23 ENCOUNTER — Telehealth: Payer: Self-pay | Admitting: *Deleted

## 2015-05-23 NOTE — Telephone Encounter (Signed)
Per Dr Johnnye Sima information given to DIS as the patient No Showed her visit and needs to be contacted for care.

## 2015-10-31 ENCOUNTER — Encounter (HOSPITAL_COMMUNITY): Payer: Self-pay | Admitting: Emergency Medicine

## 2015-10-31 ENCOUNTER — Emergency Department (HOSPITAL_COMMUNITY)
Admission: EM | Admit: 2015-10-31 | Discharge: 2015-10-31 | Disposition: A | Discharge status: Home / Self Care | Payer: Medicaid Other | Attending: Emergency Medicine | Admitting: Emergency Medicine

## 2015-10-31 DIAGNOSIS — Z7951 Long term (current) use of inhaled steroids: Secondary | ICD-10-CM | POA: Insufficient documentation

## 2015-10-31 DIAGNOSIS — K859 Acute pancreatitis without necrosis or infection, unspecified: Secondary | ICD-10-CM | POA: Diagnosis not present

## 2015-10-31 DIAGNOSIS — N938 Other specified abnormal uterine and vaginal bleeding: Secondary | ICD-10-CM

## 2015-10-31 DIAGNOSIS — F17210 Nicotine dependence, cigarettes, uncomplicated: Secondary | ICD-10-CM | POA: Insufficient documentation

## 2015-10-31 DIAGNOSIS — N939 Abnormal uterine and vaginal bleeding, unspecified: Secondary | ICD-10-CM | POA: Diagnosis present

## 2015-10-31 DIAGNOSIS — Z79899 Other long term (current) drug therapy: Secondary | ICD-10-CM | POA: Diagnosis not present

## 2015-10-31 LAB — CBC
HCT: 31.3 % — ABNORMAL LOW (ref 36.0–46.0)
Hemoglobin: 9.7 g/dL — ABNORMAL LOW (ref 12.0–15.0)
MCH: 20.3 pg — ABNORMAL LOW (ref 26.0–34.0)
MCHC: 31 g/dL (ref 30.0–36.0)
MCV: 65.5 fL — ABNORMAL LOW (ref 78.0–100.0)
Platelets: 203 10*3/uL (ref 150–400)
RBC: 4.78 MIL/uL (ref 3.87–5.11)
RDW: 18.6 % — ABNORMAL HIGH (ref 11.5–15.5)
WBC: 2.4 10*3/uL — ABNORMAL LOW (ref 4.0–10.5)

## 2015-10-31 LAB — BASIC METABOLIC PANEL
Anion gap: 6 (ref 5–15)
BUN: 7 mg/dL (ref 6–20)
CO2: 24 mmol/L (ref 22–32)
Calcium: 8.3 mg/dL — ABNORMAL LOW (ref 8.9–10.3)
Chloride: 106 mmol/L (ref 101–111)
Creatinine, Ser: 0.77 mg/dL (ref 0.44–1.00)
GFR calc Af Amer: >60 mL/min (ref >60)
GFR calc non Af Amer: >60 mL/min (ref >60)
Glucose, Bld: 104 mg/dL — ABNORMAL HIGH (ref 65–99)
Potassium: 3.8 mmol/L (ref 3.5–5.1)
Sodium: 136 mmol/L (ref 135–145)

## 2015-10-31 LAB — WET PREP, GENITAL
Sperm: NONE SEEN
Trich, Wet Prep: NONE SEEN
Yeast Wet Prep HPF POC: NONE SEEN

## 2015-10-31 LAB — I-STAT BETA HCG BLOOD, ED (MC, WL, AP ONLY): I-stat hCG, quantitative: <5 m[IU]/mL (ref <5)

## 2015-10-31 LAB — URINALYSIS, ROUTINE W REFLEX MICROSCOPIC
Bilirubin Urine: NEGATIVE
Glucose, UA: NEGATIVE mg/dL
Ketones, ur: NEGATIVE mg/dL
Nitrite: NEGATIVE
Protein, ur: 100 mg/dL — AB
Specific Gravity, Urine: 1.021 (ref 1.005–1.030)
pH: 6 (ref 5.0–8.0)

## 2015-10-31 LAB — LIPASE, BLOOD: Lipase: 105 U/L — ABNORMAL HIGH (ref 11–51)

## 2015-10-31 LAB — URINE MICROSCOPIC-ADD ON

## 2015-10-31 LAB — CBG MONITORING, ED: Glucose-Capillary: 97 mg/dL (ref 65–99)

## 2015-10-31 MED ORDER — SODIUM CHLORIDE 0.9 % IV BOLUS (SEPSIS)
1000.0000 mL | Freq: Once | INTRAVENOUS | Status: AC
  Administered 2015-10-31: 1000 mL via INTRAVENOUS

## 2015-10-31 MED ORDER — ONDANSETRON 4 MG PO TBDP: ORAL_TABLET | ORAL | Status: DC

## 2015-10-31 MED ORDER — KETOROLAC TROMETHAMINE 30 MG/ML IJ SOLN
30.0000 mg | Freq: Once | INTRAMUSCULAR | Status: AC
  Administered 2015-10-31: 30 mg via INTRAVENOUS
  Filled 2015-10-31: qty 1

## 2015-10-31 MED ORDER — ACETAMINOPHEN 500 MG PO TABS
1000.0000 mg | ORAL_TABLET | Freq: Once | ORAL | Status: AC
  Administered 2015-10-31: 1000 mg via ORAL
  Filled 2015-10-31: qty 2

## 2015-10-31 MED ORDER — MORPHINE SULFATE 15 MG PO TABS: 15.0000 mg | ORAL_TABLET | ORAL | Status: DC | PRN

## 2015-10-31 NOTE — Discharge Instructions (Signed)

## 2015-10-31 NOTE — ED Provider Notes (Signed)
CSN: [id number]     Arrival date & time 10/31/15  0915 History   First MD Initiated Contact with Patient 10/31/15 [phone]     Chief Complaint  Patient presents with  . Dizziness  . Abdominal Cramping     (Consider location/radiation/quality/duration/timing/severity/associated sxs/prior Treatment) Patient is a 45 y.o. female presenting with dizziness and cramps. The history is provided by the patient.  Dizziness Quality:  Lightheadedness Onset quality:  Sudden Duration:  2 hours Timing:  Constant Progression:  Partially resolved Chronicity:  New Relieved by:  Nothing Worsened by:  Nothing Ineffective treatments:  None tried Associated symptoms: no chest pain, no headaches, no nausea, no palpitations, no shortness of breath and no vomiting   Abdominal Cramping Pertinent negatives include no chest pain, no headaches and no shortness of breath.    45 yo F With a chief complaint of vaginal bleeding. Patient is on her current mental cycle however feels that it's worse than her previous. Patient feels that she is feeling about 9 pads a day. Today felt bad and went to the bathroom and vomited after which felt a bit lightheaded like she might pass out. She then decided to come to the emergency department. Took some over-the-counter medicines morning that she is unsure of the exact formulation. This felt a little more tired than normal. Has a history of endometriosis but has not sought further therapy due to insurance reasons.  Past Medical History  Diagnosis Date  . Fibroids   . Endometriosis    Past Surgical History  Procedure Laterality Date  . Cesarean section     No family history on file. Social History  Substance Use Topics  . Smoking status: Current Every Day Smoker -- 0.25 packs/day    Types: Cigarettes  . Smokeless tobacco: None  . Alcohol Use: Yes   OB History    No data available     Review of Systems  Constitutional: Negative for fever and chills.  HENT: Negative  for congestion and rhinorrhea.   Eyes: Negative for redness and visual disturbance.  Respiratory: Negative for shortness of breath and wheezing.   Cardiovascular: Negative for chest pain and palpitations.  Gastrointestinal: Negative for nausea and vomiting.  Genitourinary: Positive for vaginal bleeding and pelvic pain. Negative for dysuria and urgency.  Musculoskeletal: Negative for myalgias and arthralgias.  Skin: Negative for pallor and wound.  Neurological: Positive for light-headedness. Negative for dizziness and headaches.      Allergies  Naproxen sodium  Home Medications   Prior to Admission medications   Medication Sig Start Date End Date Taking? Authorizing Provider  Acetaminophen (PAIN RELIEF PO) Take 2-4 tablets by mouth every 6 (six) hours as needed (pain).   Yes Historical Provider, MD  cetirizine (ZYRTEC) 10 MG tablet Take 10 mg by mouth daily as needed for allergies.   Yes Historical Provider, MD  ferrous sulfate (EQL SLOW RELEASE IRON) 160 (50 FE) MG TBCR SR tablet Take 1 tablet (160 mg total) by mouth daily. 11/12/14  Yes Earleen Newport, MD  fluticasone (FLONASE) 50 MCG/ACT nasal spray Place 1 spray into both nostrils daily as needed for allergies or rhinitis.   Yes Historical Provider, MD  guaiFENesin (MUCINEX) 600 MG 12 hr tablet Take 1,200 mg by mouth 2 (two) times daily as needed for cough or to loosen phlegm.   Yes Historical Provider, MD  ibuprofen (ADVIL,MOTRIN) 200 MG tablet Take 200-1,000 mg by mouth every 6 (six) hours as needed for moderate pain.   Yes  Historical Provider, MD  ibuprofen (ADVIL,MOTRIN) 600 MG tablet Take 1 tablet (600 mg total) by mouth every 8 (eight) hours as needed. Patient not taking: Reported on 10/31/2015 05/15/15   Dorie Rank, MD  morphine (MSIR) 15 MG tablet Take 1 tablet (15 mg total) by mouth every 4 (four) hours as needed for severe pain. 10/31/15   Deno Etienne, DO  ondansetron (ZOFRAN ODT) 4 MG disintegrating tablet 4mg  ODT q4 hours prn  nausea/vomit 10/31/15   Deno Etienne, DO   BP 169/92 mmHg  Pulse 78  Temp(Src) 97.8 F (36.6 C) (Oral)  Resp 16  SpO2 100%  LMP 10/30/2015 Physical Exam  Constitutional: She is oriented to person, place, and time. She appears well-developed and well-nourished. No distress.  HENT:  Head: Normocephalic and atraumatic.  Eyes: EOM are normal. Pupils are equal, round, and reactive to light.  Neck: Normal range of motion. Neck supple.  Cardiovascular: Normal rate and regular rhythm.  Exam reveals no gallop and no friction rub.   No murmur heard. Pulmonary/Chest: Effort normal. She has no wheezes. She has no rales.  Abdominal: Soft. She exhibits no distension. There is no tenderness. There is no rebound and no guarding.  Genitourinary: Right adnexum displays no mass and no fullness. Left adnexum displays no mass, no tenderness and no fullness.  Musculoskeletal: She exhibits no edema or tenderness.  Neurological: She is alert and oriented to person, place, and time.  Skin: Skin is warm and dry. She is not diaphoretic.  Psychiatric: She has a normal mood and affect. Her behavior is normal.  Nursing note and vitals reviewed.   ED Course  Procedures (including critical care time) Labs Review Labs Reviewed  WET PREP, GENITAL - Abnormal; Notable for the following:    Clue Cells Wet Prep HPF POC PRESENT (*)    WBC, Wet Prep HPF POC MODERATE (*)    All other components within normal limits  BASIC METABOLIC PANEL - Abnormal; Notable for the following:    Glucose, Bld 104 (*)    Calcium 8.3 (*)    All other components within normal limits  CBC - Abnormal; Notable for the following:    WBC 2.4 (*)    Hemoglobin 9.7 (*)    HCT 31.3 (*)    MCV 65.5 (*)    MCH 20.3 (*)    RDW 18.6 (*)    All other components within normal limits  URINALYSIS, ROUTINE W REFLEX MICROSCOPIC (NOT AT [facility]) - Abnormal; Notable for the following:    Color, Urine RED (*)    APPearance CLOUDY (*)    Hgb urine dipstick  LARGE (*)    Protein, ur 100 (*)    Leukocytes, UA SMALL (*)    All other components within normal limits  LIPASE, BLOOD - Abnormal; Notable for the following:    Lipase 105 (*)    All other components within normal limits  URINE MICROSCOPIC-ADD ON - Abnormal; Notable for the following:    Squamous Epithelial / LPF 0-5 (*)    Bacteria, UA FEW (*)    All other components within normal limits  CBG MONITORING, ED  I-STAT BETA HCG BLOOD, ED ([facility], [facility], [facility] ONLY)  GC/CHLAMYDIA PROBE AMP ([place]) NOT AT [facility]    Imaging Review No results found. I have personally reviewed and evaluated these images and lab results as part of my medical decision-making.   EKG Interpretation   Date/Time:  Friday October 31 2015 09:23:45 EDT Ventricular Rate:  29  PR Interval:    QRS Duration: 97 QT Interval:  433 QTC Calculation: 471 R Axis:   36 Text Interpretation:  Sinus rhythm No old tracing to compare Confirmed by  Daun Rens MD, DANIEL [id number]) on 10/31/2015 10:28:01 AM      MDM   Final diagnoses:  Dysfunctional uterine bleeding  Acute pancreatitis, unspecified pancreatitis type    45 yo F with a chief complaint of vaginal bleeding and pelvic cramping. She feels this is worse than her normal menstrual cycle. Had one episode of vomiting today that made her feel lightheaded like she may pass out. Will give IV fluids NSAIDs pelvic exam.  Feeling much better on reassessment, tolerating PO.  Patient with pancreatitis, discussed inpatient vs outpatient therapy, will d/c.  PCP follow up.  12:27 PM:  I have discussed the diagnosis/risks/treatment options with the patient and family and believe the pt to be eligible for discharge home to follow-up with PCP. We also discussed returning to the ED immediately if new or worsening sx occur. We discussed the sx which are most concerning (e.g., sudden worsening pain, fever, inability to tolerate by mouth) that necessitate immediate return. Medications administered to  the patient during their visit and any new prescriptions provided to the patient are listed below.  Medications given during this visit Medications  sodium chloride 0.9 % bolus 1,000 mL (0 mLs Intravenous Stopped 10/31/15 1139)  ketorolac (TORADOL) 30 MG/ML injection 30 mg (30 mg Intravenous Given 10/31/15 1004)  acetaminophen (TYLENOL) tablet 1,000 mg (1,000 mg Oral Given 10/31/15 1004)    Discharge Medication List as of 10/31/2015 11:16 AM    START taking these medications   Details  morphine (MSIR) 15 MG tablet Take 1 tablet (15 mg total) by mouth every 4 (four) hours as needed for severe pain., Starting 10/31/2015, Until Discontinued, Print    ondansetron (ZOFRAN ODT) 4 MG disintegrating tablet 4mg  ODT q4 hours prn nausea/vomit, Print        The patient appears reasonably screen and/or stabilized for discharge and I doubt any other medical condition or other [facility] requiring further screening, evaluation, or treatment in the ED at this time prior to discharge.    Deno Etienne, DO 10/31/15 1227

## 2015-10-31 NOTE — ED Notes (Signed)
Pt complaint of dizziness onset 0830 and abdominal cramping. Pt reports symptoms consistent with menstrual cycle but worse today.

## 2015-11-03 LAB — GC/CHLAMYDIA PROBE AMP (~~LOC~~) NOT AT ARMC
Chlamydia: NEGATIVE
Neisseria Gonorrhea: NEGATIVE

## 2015-11-24 ENCOUNTER — Other Ambulatory Visit: Payer: Self-pay | Admitting: *Deleted

## 2015-11-24 DIAGNOSIS — Z1231 Encounter for screening mammogram for malignant neoplasm of breast: Secondary | ICD-10-CM

## 2015-12-01 ENCOUNTER — Ambulatory Visit
Admission: RE | Admit: 2015-12-01 | Discharge: 2015-12-01 | Disposition: A | Discharge status: Home / Self Care | Payer: Medicaid Other | Source: Ambulatory Visit | Attending: Family Medicine | Admitting: Family Medicine

## 2015-12-01 DIAGNOSIS — Z1231 Encounter for screening mammogram for malignant neoplasm of breast: Secondary | ICD-10-CM

## 2015-12-02 ENCOUNTER — Other Ambulatory Visit: Payer: Self-pay | Admitting: Family Medicine

## 2015-12-02 DIAGNOSIS — R928 Other abnormal and inconclusive findings on diagnostic imaging of breast: Secondary | ICD-10-CM

## 2015-12-09 ENCOUNTER — Ambulatory Visit
Admission: RE | Admit: 2015-12-09 | Discharge: 2015-12-09 | Disposition: A | Discharge status: Home / Self Care | Payer: Medicaid Other | Source: Ambulatory Visit | Attending: Family Medicine | Admitting: Family Medicine

## 2015-12-09 DIAGNOSIS — R928 Other abnormal and inconclusive findings on diagnostic imaging of breast: Secondary | ICD-10-CM

## 2016-02-21 ENCOUNTER — Emergency Department (HOSPITAL_COMMUNITY): Payer: Medicaid Other

## 2016-02-21 ENCOUNTER — Encounter (HOSPITAL_COMMUNITY): Payer: Self-pay | Admitting: *Deleted

## 2016-02-21 ENCOUNTER — Emergency Department (HOSPITAL_COMMUNITY)
Admission: EM | Admit: 2016-02-21 | Discharge: 2016-02-21 | Disposition: A | Discharge status: Home / Self Care | Payer: Medicaid Other | Attending: Emergency Medicine | Admitting: Emergency Medicine

## 2016-02-21 DIAGNOSIS — N939 Abnormal uterine and vaginal bleeding, unspecified: Secondary | ICD-10-CM

## 2016-02-21 DIAGNOSIS — N809 Endometriosis, unspecified: Secondary | ICD-10-CM | POA: Diagnosis not present

## 2016-02-21 DIAGNOSIS — F17210 Nicotine dependence, cigarettes, uncomplicated: Secondary | ICD-10-CM | POA: Insufficient documentation

## 2016-02-21 DIAGNOSIS — R102 Pelvic and perineal pain: Secondary | ICD-10-CM

## 2016-02-21 DIAGNOSIS — R109 Unspecified abdominal pain: Secondary | ICD-10-CM | POA: Diagnosis present

## 2016-02-21 LAB — CBC WITH DIFFERENTIAL/PLATELET
Basophils Absolute: 0 10*3/uL (ref 0.0–0.1)
Basophils Relative: 0 %
Eosinophils Absolute: 0 10*3/uL (ref 0.0–0.7)
Eosinophils Relative: 0 %
HCT: 37.3 % (ref 36.0–46.0)
Hemoglobin: 11.8 g/dL — ABNORMAL LOW (ref 12.0–15.0)
Lymphocytes Relative: 36 %
Lymphs Abs: 1.1 10*3/uL (ref 0.7–4.0)
MCH: 23.3 pg — ABNORMAL LOW (ref 26.0–34.0)
MCHC: 31.6 g/dL (ref 30.0–36.0)
MCV: 73.7 fL — ABNORMAL LOW (ref 78.0–100.0)
Monocytes Absolute: 0.2 10*3/uL (ref 0.1–1.0)
Monocytes Relative: 7 %
Neutro Abs: 1.8 10*3/uL (ref 1.7–7.7)
Neutrophils Relative %: 57 %
Platelets: 209 10*3/uL (ref 150–400)
RBC: 5.06 MIL/uL (ref 3.87–5.11)
RDW: 22.6 % — ABNORMAL HIGH (ref 11.5–15.5)
WBC: 3.1 10*3/uL — ABNORMAL LOW (ref 4.0–10.5)

## 2016-02-21 LAB — COMPREHENSIVE METABOLIC PANEL
ALT: 50 U/L (ref 14–54)
AST: 62 U/L — ABNORMAL HIGH (ref 15–41)
Albumin: 3.6 g/dL (ref 3.5–5.0)
Alkaline Phosphatase: 61 U/L (ref 38–126)
Anion gap: 8 (ref 5–15)
BUN: 8 mg/dL (ref 6–20)
CO2: 27 mmol/L (ref 22–32)
Calcium: 9.8 mg/dL (ref 8.9–10.3)
Chloride: 102 mmol/L (ref 101–111)
Creatinine, Ser: 0.88 mg/dL (ref 0.44–1.00)
GFR calc Af Amer: >60 mL/min (ref >60)
GFR calc non Af Amer: >60 mL/min (ref >60)
Glucose, Bld: 123 mg/dL — ABNORMAL HIGH (ref 65–99)
Potassium: 3.6 mmol/L (ref 3.5–5.1)
Sodium: 137 mmol/L (ref 135–145)
Total Bilirubin: 0.6 mg/dL (ref 0.3–1.2)
Total Protein: 8.1 g/dL (ref 6.5–8.1)

## 2016-02-21 LAB — I-STAT BETA HCG BLOOD, ED (MC, WL, AP ONLY): I-stat hCG, quantitative: <5 m[IU]/mL (ref <5)

## 2016-02-21 MED ORDER — HYDROMORPHONE HCL 2 MG/ML IJ SOLN
1.0000 mg | Freq: Once | INTRAMUSCULAR | Status: AC
  Administered 2016-02-21: 1 mg via INTRAVENOUS
  Filled 2016-02-21: qty 1

## 2016-02-21 MED ORDER — OXYCODONE-ACETAMINOPHEN 5-325 MG PO TABS: 2.0000 | ORAL_TABLET | ORAL | 0 refills | Status: DC | PRN

## 2016-02-21 MED ORDER — ONDANSETRON HCL 4 MG PO TABS: 4.0000 mg | ORAL_TABLET | Freq: Four times a day (QID) | ORAL | 0 refills | Status: DC

## 2016-02-21 NOTE — ED Triage Notes (Addendum)
Patient states she has had 2nd period this month.  She states she is having abd pain but is worse.  States the pain is very 5 min and feels like it is cramping.  States it lasts 1 min.  Patient was to see her GYN but had to change the appointment.  Patient is bending over in pain.  Patient states she is having to change her pad every 45 min due to heavy bleeding.

## 2016-02-21 NOTE — ED Notes (Signed)
Patient transported to Ultrasound 

## 2016-02-21 NOTE — ED Provider Notes (Signed)
[place] DEPT Provider Note   CSN: [id number] Arrival date & time: 02/21/16  1258   History   Chief Complaint Chief Complaint  Patient presents with  . Abdominal Pain  . Vaginal Bleeding    2nd period.       HPI Teresa [name] is a 45 y.o. female.  HPI     45 year old female presents today with complaints of abdominal pain. Patient reports history of the same in the past. She was seen by OB/GYN in [facility] with plan for total hysterectomy. She reports that she did not follow through with this. She notes most recent episode of vaginal bleeding started 2 weeks ago, passing large clots than normal. She reports this feels like her normal period but is having more severe abdominal cramps. She denies any history of bleeding disorders, or any abnormal bruising or bleeding now other than the vaginal bleeding. Patient reports this is due to her endometriosis. She has been placed on hormones several years ago for this. Patient reports she has already consult at [facility] family OB/GYN and has a follow-up appointment on November 6. Patient denies any dizziness, or signs of significant blood loss.   Past Medical History:  Diagnosis Date  . Endometriosis   . Fibroids     There are no active problems to display for this patient.   Past Surgical History:  Procedure Laterality Date  . CESAREAN SECTION      OB History    No data available       Home Medications    Prior to Admission medications   Medication Sig Start Date End Date Taking? Authorizing Provider  Acetaminophen (PAIN RELIEF PO) Take 2-4 tablets by mouth every 6 (six) hours as needed (pain).    Historical Provider, MD  cetirizine (ZYRTEC) 10 MG tablet Take 10 mg by mouth daily as needed for allergies.    Historical Provider, MD  ferrous sulfate (EQL SLOW RELEASE IRON) 160 (50 FE) MG TBCR SR tablet Take 1 tablet (160 mg total) by mouth daily. 11/12/14   Earleen Newport, MD  fluticasone (FLONASE) 50 MCG/ACT nasal  spray Place 1 spray into both nostrils daily as needed for allergies or rhinitis.    Historical Provider, MD  guaiFENesin (MUCINEX) 600 MG 12 hr tablet Take 1,200 mg by mouth 2 (two) times daily as needed for cough or to loosen phlegm.    Historical Provider, MD  ibuprofen (ADVIL,MOTRIN) 200 MG tablet Take 200-1,000 mg by mouth every 6 (six) hours as needed for moderate pain.    Historical Provider, MD  ibuprofen (ADVIL,MOTRIN) 600 MG tablet Take 1 tablet (600 mg total) by mouth every 8 (eight) hours as needed. Patient not taking: Reported on 10/31/2015 05/15/15   Dorie Rank, MD  morphine (MSIR) 15 MG tablet Take 1 tablet (15 mg total) by mouth every 4 (four) hours as needed for severe pain. 10/31/15   Deno Etienne, DO  ondansetron (ZOFRAN ODT) 4 MG disintegrating tablet 4mg  ODT q4 hours prn nausea/vomit 10/31/15   Deno Etienne, DO  ondansetron (ZOFRAN) 4 MG tablet Take 1 tablet (4 mg total) by mouth every 6 (six) hours. 02/21/16   Okey Regal, PA-C  oxyCODONE-acetaminophen (PERCOCET/ROXICET) 5-325 MG tablet Take 2 tablets by mouth every 4 (four) hours as needed for severe pain. 02/21/16   Okey Regal, PA-C    Family History No family history on file.  Social History Social History  Substance Use Topics  . Smoking status: Current Every Day Smoker  Packs/day: 0.25    Types: Cigarettes  . Smokeless tobacco: Never Used  . Alcohol use Yes     Allergies   Naproxen sodium   Review of Systems Review of Systems  All other systems reviewed and are negative.   Physical Exam Updated Vital Signs BP 154/87   Pulse 60   Temp 97.7 F (36.5 C) (Oral)   Resp (!) 32   Wt 69.1 kg   SpO2 100%   BMI 24.60 kg/m   Physical Exam  Constitutional: She is oriented to person, place, and time. She appears well-developed and well-nourished.  HENT:  Head: Normocephalic and atraumatic.  Eyes: Conjunctivae are normal. Pupils are equal, round, and reactive to light. Right eye exhibits no discharge. Left  eye exhibits no discharge. No scleral icterus.  Neck: Normal range of motion. No JVD present. No tracheal deviation present.  Pulmonary/Chest: Effort normal. No stridor.  Abdominal: Soft. She exhibits no distension.  Minor TTP of bilateral pelvis   Neurological: She is alert and oriented to person, place, and time. Coordination normal.  Skin: Skin is warm.  Psychiatric: She has a normal mood and affect. Her behavior is normal. Judgment and thought content normal.  Nursing note and vitals reviewed.    ED Treatments / Results  Labs (all labs ordered are listed, but only abnormal results are displayed) Labs Reviewed  CBC WITH DIFFERENTIAL/PLATELET - Abnormal; Notable for the following:       Result Value   WBC 3.1 (*)    Hemoglobin 11.8 (*)    MCV 73.7 (*)    MCH 23.3 (*)    RDW 22.6 (*)    All other components within normal limits  COMPREHENSIVE METABOLIC PANEL - Abnormal; Notable for the following:    Glucose, Bld 123 (*)    AST 62 (*)    All other components within normal limits  I-STAT BETA HCG BLOOD, ED ([facility], [facility], [facility] ONLY)    EKG  EKG Interpretation None       Radiology US Transvaginal Non-ob  Result Date: 02/21/2016 CLINICAL DATA:  Bilateral pelvic pain for 1 month. EXAM: TRANSABDOMINAL AND TRANSVAGINAL ULTRASOUND OF PELVIS DOPPLER ULTRASOUND OF OVARIES TECHNIQUE: Both transabdominal and transvaginal ultrasound examinations of the pelvis were performed. Transabdominal technique was performed for global imaging of the pelvis including uterus, ovaries, adnexal regions, and pelvic cul-de-sac. It was necessary to proceed with endovaginal exam following the transabdominal exam to visualize the endometrium and ovaries. Color and duplex Doppler ultrasound was utilized to evaluate blood flow to the ovaries. COMPARISON:  None. FINDINGS: Uterus Measurements: 11.1 x 6.4 x 6.6 cm. 1.9 x 2 x 2.4 cm hypoechoic uterine mass near the uterine fundus most consistent with a small uterine  fibroid. Endometrium Thickness: 16 mm.  No focal abnormality visualized. Right ovary Measurements: 2.8 x 1.7 x 2.3 cm. Normal appearance/no adnexal mass. Left ovary Measurements: 3.1 x 2.2 x 3.1 cm. 2.1 x 1.5 x 1.5 cm hypoechoic avascular left ovarian mass with echogenic material along the wall and a thin internal septation. Pulsed Doppler evaluation of both ovaries demonstrates normal low-resistance arterial and venous waveforms. Other findings Small amount of pelvic free fluid likely physiologic. IMPRESSION: 1. No ovarian torsion. 2. Complex cystic left ovarian mass without internal Doppler flow. Differential considerations include hemorrhagic cyst versus endometrioma. Short-interval follow up ultrasound in 6-12 weeks is recommended, preferably during the week following the patient's normal menses. 3. Small uterine fibroid measuring 2.1 x 1.5 x 1.5 cm. Electronically Signed  By: Kathreen Devoid   On: 02/21/2016 16:00   US Pelvis Complete  Result Date: 02/21/2016 CLINICAL DATA:  Bilateral pelvic pain for 1 month. EXAM: TRANSABDOMINAL AND TRANSVAGINAL ULTRASOUND OF PELVIS DOPPLER ULTRASOUND OF OVARIES TECHNIQUE: Both transabdominal and transvaginal ultrasound examinations of the pelvis were performed. Transabdominal technique was performed for global imaging of the pelvis including uterus, ovaries, adnexal regions, and pelvic cul-de-sac. It was necessary to proceed with endovaginal exam following the transabdominal exam to visualize the endometrium and ovaries. Color and duplex Doppler ultrasound was utilized to evaluate blood flow to the ovaries. COMPARISON:  None. FINDINGS: Uterus Measurements: 11.1 x 6.4 x 6.6 cm. 1.9 x 2 x 2.4 cm hypoechoic uterine mass near the uterine fundus most consistent with a small uterine fibroid. Endometrium Thickness: 16 mm.  No focal abnormality visualized. Right ovary Measurements: 2.8 x 1.7 x 2.3 cm. Normal appearance/no adnexal mass. Left ovary Measurements: 3.1 x 2.2 x 3.1 cm.  2.1 x 1.5 x 1.5 cm hypoechoic avascular left ovarian mass with echogenic material along the wall and a thin internal septation. Pulsed Doppler evaluation of both ovaries demonstrates normal low-resistance arterial and venous waveforms. Other findings Small amount of pelvic free fluid likely physiologic. IMPRESSION: 1. No ovarian torsion. 2. Complex cystic left ovarian mass without internal Doppler flow. Differential considerations include hemorrhagic cyst versus endometrioma. Short-interval follow up ultrasound in 6-12 weeks is recommended, preferably during the week following the patient's normal menses. 3. Small uterine fibroid measuring 2.1 x 1.5 x 1.5 cm. Electronically Signed   By: Kathreen Devoid   On: 02/21/2016 16:00   [place] Art/ven Flow Abd Pelv Doppler  Result Date: 02/21/2016 CLINICAL DATA:  Bilateral pelvic pain for 1 month. EXAM: TRANSABDOMINAL AND TRANSVAGINAL ULTRASOUND OF PELVIS DOPPLER ULTRASOUND OF OVARIES TECHNIQUE: Both transabdominal and transvaginal ultrasound examinations of the pelvis were performed. Transabdominal technique was performed for global imaging of the pelvis including uterus, ovaries, adnexal regions, and pelvic cul-de-sac. It was necessary to proceed with endovaginal exam following the transabdominal exam to visualize the endometrium and ovaries. Color and duplex Doppler ultrasound was utilized to evaluate blood flow to the ovaries. COMPARISON:  None. FINDINGS: Uterus Measurements: 11.1 x 6.4 x 6.6 cm. 1.9 x 2 x 2.4 cm hypoechoic uterine mass near the uterine fundus most consistent with a small uterine fibroid. Endometrium Thickness: 16 mm.  No focal abnormality visualized. Right ovary Measurements: 2.8 x 1.7 x 2.3 cm. Normal appearance/no adnexal mass. Left ovary Measurements: 3.1 x 2.2 x 3.1 cm. 2.1 x 1.5 x 1.5 cm hypoechoic avascular left ovarian mass with echogenic material along the wall and a thin internal septation. Pulsed Doppler evaluation of both ovaries demonstrates  normal low-resistance arterial and venous waveforms. Other findings Small amount of pelvic free fluid likely physiologic. IMPRESSION: 1. No ovarian torsion. 2. Complex cystic left ovarian mass without internal Doppler flow. Differential considerations include hemorrhagic cyst versus endometrioma. Short-interval follow up ultrasound in 6-12 weeks is recommended, preferably during the week following the patient's normal menses. 3. Small uterine fibroid measuring 2.1 x 1.5 x 1.5 cm. Electronically Signed   By: Kathreen Devoid   On: 02/21/2016 16:00    Procedures Procedures (including critical care time)  Medications Ordered in ED Medications  HYDROmorphone (DILAUDID) injection 1 mg (1 mg Intravenous Given 02/21/16 1452)     Initial Impression / Assessment and Plan / ED Course  I have reviewed the triage vital signs and the nursing notes.  Pertinent labs & imaging results that were  available during my care of the patient were reviewed by me and considered in my medical decision making (see chart for details).  Clinical Course    Final Clinical Impressions(s) / ED Diagnoses   Final diagnoses:  Pelvic pain  Endometriosis  Vaginal bleeding    Labs:  Imaging:  Consults:  Therapeutics:  Discharge Meds:   Assessment/Plan:  Patient presents with vaginal bleeding. Patient has a history of endometriosis, similar symptoms in the past. Patient's hemoglobin 11.8 and signs of vital sign abnormalities. Ultrasound shows ovarian mass requiring follow-up ultrasound. Patient's ultrasound results were shared with her. Patient reports this is identical to previous, no need for pelvic exam at this time, she would follow-up with OB/GYN. She is given strict return precautions, she verbalized understanding and agreement to today's plan had no further questions or concerns at time of discharge.   New Prescriptions Discharge Medication List as of 02/21/2016  4:23 PM    START taking these medications    Details  ondansetron (ZOFRAN) 4 MG tablet Take 1 tablet (4 mg total) by mouth every 6 (six) hours., Starting Sat 02/21/2016, Print    oxyCODONE-acetaminophen (PERCOCET/ROXICET) 5-325 MG tablet Take 2 tablets by mouth every 4 (four) hours as needed for severe pain., Starting Sat 02/21/2016, Print         [employer], PA-C 02/22/16 1543    Veryl Speak, MD 02/26/16 1318

## 2016-02-21 NOTE — Discharge Instructions (Signed)
Please read attached information. If you experience any new or worsening signs or symptoms please return to the emergency room for evaluation. Please follow-up with your primary care provider or specialist as discussed. Please use medication prescribed only as directed and discontinue taking if you have any concerning signs or symptoms.   °

## 2016-05-25 ENCOUNTER — Other Ambulatory Visit: Payer: Self-pay | Admitting: Family Medicine

## 2016-05-25 DIAGNOSIS — R921 Mammographic calcification found on diagnostic imaging of breast: Secondary | ICD-10-CM

## 2016-06-11 ENCOUNTER — Ambulatory Visit
Admission: RE | Admit: 2016-06-11 | Discharge: 2016-06-11 | Disposition: A | Discharge status: Home / Self Care | Payer: Medicaid Other | Source: Ambulatory Visit | Attending: Family Medicine | Admitting: Family Medicine

## 2016-06-11 DIAGNOSIS — R921 Mammographic calcification found on diagnostic imaging of breast: Secondary | ICD-10-CM

## 2016-06-18 ENCOUNTER — Emergency Department (HOSPITAL_COMMUNITY): Payer: Medicaid Other

## 2016-06-18 ENCOUNTER — Encounter (HOSPITAL_COMMUNITY): Payer: Self-pay

## 2016-06-18 ENCOUNTER — Emergency Department (HOSPITAL_COMMUNITY)
Admission: EM | Admit: 2016-06-18 | Discharge: 2016-06-18 | Disposition: A | Discharge status: Home / Self Care | Payer: Medicaid Other | Attending: Neurology | Admitting: Neurology

## 2016-06-18 DIAGNOSIS — N760 Acute vaginitis: Secondary | ICD-10-CM | POA: Insufficient documentation

## 2016-06-18 DIAGNOSIS — R1031 Right lower quadrant pain: Secondary | ICD-10-CM | POA: Diagnosis present

## 2016-06-18 DIAGNOSIS — F17210 Nicotine dependence, cigarettes, uncomplicated: Secondary | ICD-10-CM | POA: Insufficient documentation

## 2016-06-18 DIAGNOSIS — Z79899 Other long term (current) drug therapy: Secondary | ICD-10-CM | POA: Diagnosis not present

## 2016-06-18 DIAGNOSIS — J45909 Unspecified asthma, uncomplicated: Secondary | ICD-10-CM | POA: Insufficient documentation

## 2016-06-18 DIAGNOSIS — B9689 Other specified bacterial agents as the cause of diseases classified elsewhere: Secondary | ICD-10-CM | POA: Insufficient documentation

## 2016-06-18 DIAGNOSIS — R109 Unspecified abdominal pain: Secondary | ICD-10-CM

## 2016-06-18 HISTORY — DX: Unspecified asthma, uncomplicated: J45.909

## 2016-06-18 HISTORY — DX: Acute pancreatitis without necrosis or infection, unspecified: K85.90

## 2016-06-18 LAB — CBC WITH DIFFERENTIAL/PLATELET
Basophils Absolute: 0 10*3/uL (ref 0.0–0.1)
Basophils Relative: 0 %
Eosinophils Absolute: 0 10*3/uL (ref 0.0–0.7)
Eosinophils Relative: 0 %
HCT: 36.9 % (ref 36.0–46.0)
Hemoglobin: 12.3 g/dL (ref 12.0–15.0)
Lymphocytes Relative: 40 %
Lymphs Abs: 1 10*3/uL (ref 0.7–4.0)
MCH: 28.7 pg (ref 26.0–34.0)
MCHC: 33.3 g/dL (ref 30.0–36.0)
MCV: 86.2 fL (ref 78.0–100.0)
Monocytes Absolute: 0.3 10*3/uL (ref 0.1–1.0)
Monocytes Relative: 11 %
Neutro Abs: 1.2 10*3/uL — ABNORMAL LOW (ref 1.7–7.7)
Neutrophils Relative %: 49 %
Platelets: 181 10*3/uL (ref 150–400)
RBC: 4.28 MIL/uL (ref 3.87–5.11)
RDW: 12.6 % (ref 11.5–15.5)
WBC: 2.5 10*3/uL — ABNORMAL LOW (ref 4.0–10.5)

## 2016-06-18 LAB — URINALYSIS, ROUTINE W REFLEX MICROSCOPIC
Bilirubin Urine: NEGATIVE
Glucose, UA: NEGATIVE mg/dL
Hgb urine dipstick: NEGATIVE
Ketones, ur: NEGATIVE mg/dL
Leukocytes, UA: NEGATIVE
Nitrite: NEGATIVE
Protein, ur: NEGATIVE mg/dL
Specific Gravity, Urine: 1.005 (ref 1.005–1.030)
pH: 8 (ref 5.0–8.0)

## 2016-06-18 LAB — COMPREHENSIVE METABOLIC PANEL
ALT: 64 U/L — ABNORMAL HIGH (ref 14–54)
AST: 93 U/L — ABNORMAL HIGH (ref 15–41)
Albumin: 3.7 g/dL (ref 3.5–5.0)
Alkaline Phosphatase: 93 U/L (ref 38–126)
Anion gap: 11 (ref 5–15)
BUN: 10 mg/dL (ref 6–20)
CO2: 25 mmol/L (ref 22–32)
Calcium: 9.2 mg/dL (ref 8.9–10.3)
Chloride: 101 mmol/L (ref 101–111)
Creatinine, Ser: 0.77 mg/dL (ref 0.44–1.00)
GFR calc Af Amer: >60 mL/min (ref >60)
GFR calc non Af Amer: >60 mL/min (ref >60)
Glucose, Bld: 96 mg/dL (ref 65–99)
Potassium: 3.8 mmol/L (ref 3.5–5.1)
Sodium: 137 mmol/L (ref 135–145)
Total Bilirubin: 0.4 mg/dL (ref 0.3–1.2)
Total Protein: 8 g/dL (ref 6.5–8.1)

## 2016-06-18 LAB — WET PREP, GENITAL
Sperm: NONE SEEN
Trich, Wet Prep: NONE SEEN
Yeast Wet Prep HPF POC: NONE SEEN

## 2016-06-18 LAB — I-STAT CG4 LACTIC ACID, ED: Lactic Acid, Venous: 0.89 mmol/L (ref 0.5–1.9)

## 2016-06-18 LAB — LIPASE, BLOOD: Lipase: 26 U/L (ref 11–51)

## 2016-06-18 LAB — POC URINE PREG, ED: Preg Test, Ur: NEGATIVE

## 2016-06-18 MED ORDER — OXYCODONE HCL 5 MG PO TABS: 5.0000 mg | ORAL_TABLET | ORAL | 0 refills | Status: DC | PRN

## 2016-06-18 MED ORDER — SODIUM CHLORIDE 0.9 % IV BOLUS (SEPSIS)
1000.0000 mL | Freq: Once | INTRAVENOUS | Status: AC
  Administered 2016-06-18: 1000 mL via INTRAVENOUS

## 2016-06-18 MED ORDER — METRONIDAZOLE 500 MG PO TABS: 500.0000 mg | ORAL_TABLET | Freq: Two times a day (BID) | ORAL | 0 refills | Status: AC

## 2016-06-18 MED ORDER — ONDANSETRON HCL 4 MG/2ML IJ SOLN
4.0000 mg | Freq: Once | INTRAMUSCULAR | Status: AC
  Administered 2016-06-18: 4 mg via INTRAVENOUS
  Filled 2016-06-18: qty 2

## 2016-06-18 MED ORDER — ONDANSETRON HCL 4 MG PO TABS: 4.0000 mg | ORAL_TABLET | Freq: Three times a day (TID) | ORAL | 0 refills | Status: DC | PRN

## 2016-06-18 MED ORDER — HYDROMORPHONE HCL 2 MG/ML IJ SOLN
1.0000 mg | Freq: Once | INTRAMUSCULAR | Status: AC
  Administered 2016-06-18: 1 mg via INTRAVENOUS
  Filled 2016-06-18: qty 1

## 2016-06-18 MED ORDER — IOPAMIDOL (ISOVUE-300) INJECTION 61%
INTRAVENOUS | Status: AC
  Administered 2016-06-18: 100 mL
  Filled 2016-06-18: qty 100

## 2016-06-18 MED ORDER — METOCLOPRAMIDE HCL 5 MG/ML IJ SOLN
10.0000 mg | Freq: Once | INTRAMUSCULAR | Status: AC
  Administered 2016-06-18: 10 mg via INTRAVENOUS
  Filled 2016-06-18: qty 2

## 2016-06-18 MED ORDER — KETOROLAC TROMETHAMINE 30 MG/ML IJ SOLN: 30.0000 mg | Freq: Once | INTRAMUSCULAR | Status: DC

## 2016-06-18 MED ORDER — METRONIDAZOLE 500 MG PO TABS
500.0000 mg | ORAL_TABLET | Freq: Once | ORAL | Status: AC
  Administered 2016-06-18: 500 mg via ORAL
  Filled 2016-06-18: qty 1

## 2016-06-18 MED ORDER — LORAZEPAM 2 MG/ML IJ SOLN
1.0000 mg | Freq: Once | INTRAMUSCULAR | Status: AC
  Administered 2016-06-18: 1 mg via INTRAVENOUS
  Filled 2016-06-18: qty 1

## 2016-06-18 NOTE — ED Notes (Signed)
Pt states that she is feeling much better.

## 2016-06-18 NOTE — ED Notes (Signed)
abd pain rt side after having bm, got hot , denies dysuria or vag d/c, no n/v/d,  States has fribroids and cysts,  Has endo metriosis and were supossed to surgery but lost her  insurance

## 2016-06-18 NOTE — ED Notes (Signed)
Pt states feels so much better and that she slept,

## 2016-06-18 NOTE — ED Notes (Signed)
Pt was feeling good then she asked for something to drink and then felt worse

## 2016-06-18 NOTE — Discharge Instructions (Signed)
Please take your pain medicine and nausea medicine as needed for symptoms. Please schedule a follow-up appointment with the OB/GYN team for further management of your uterine and ovarian problems. If any symptoms return or worsen, please return to the nearest emergency department. He contacted if any of your results are positive from the pelvic swabs. Please stay hydrated.

## 2016-06-18 NOTE — ED Provider Notes (Signed)
[place] DEPT Provider Note   CSN: [id number] Arrival date & time: 06/18/16  [id number]     History   Chief Complaint Chief Complaint  Patient presents with  . Abdominal Pain    HPI Teresa [name] is a 46 y.o. female with a past medical history significant for pancreatitis, asthma, endometriosis, and uterine fibroids who presents with right lower quadrant abdominal pain. Patient reports that she had a slightly loose bowel movement this morning with no blood in it and, during the bowel movement, had gradual onset of right lower quadrant abdominal pain. She says this pain "feels different" prior episodes of endometriosis pain. Her prior endometriosis pain which usually in the center and across her lower abdomen. She describes his pain is located primarily in the right lower quadrant. She says that when she curls up in a ball, it makes it feel slightly better. She denies nausea or vomiting, constipation, and denies recent abdominal trauma. She denies fevers, chills, congestion, cough, rhinorrhea. Reports that she has no dysuria, hematuria, or other urinary symptoms.  Patient prescription for pain as greater than 10 out of 10 severity, nonradiating, and constant. She says it is a punching/sharp type pain. Palpation makes the pain worse.  Patient does report that her last menstrual period was 13 days ago on February 10 however, she reports that her period has "been acting up the last 2 months. She elaborates by saying it has been slightly irregular. She reports that she chronically has vaginal spotting but nothing has changed recently.  HPI  Past Medical History:  Diagnosis Date  . Asthma   . Endometriosis   . Fibroids   . Pancreatitis     There are no active problems to display for this patient.   Past Surgical History:  Procedure Laterality Date  . CESAREAN SECTION      OB History    No data available       Home Medications    Prior to Admission medications   Medication  Sig Start Date End Date Taking? Authorizing Provider  Acetaminophen (PAIN RELIEF PO) Take 2-4 tablets by mouth every 6 (six) hours as needed (pain).    Historical Provider, MD  cetirizine (ZYRTEC) 10 MG tablet Take 10 mg by mouth daily as needed for allergies.    Historical Provider, MD  ferrous sulfate (EQL SLOW RELEASE IRON) 160 (50 FE) MG TBCR SR tablet Take 1 tablet (160 mg total) by mouth daily. 11/12/14   Earleen Newport, MD  fluticasone (FLONASE) 50 MCG/ACT nasal spray Place 1 spray into both nostrils daily as needed for allergies or rhinitis.    Historical Provider, MD  guaiFENesin (MUCINEX) 600 MG 12 hr tablet Take 1,200 mg by mouth 2 (two) times daily as needed for cough or to loosen phlegm.    Historical Provider, MD  ibuprofen (ADVIL,MOTRIN) 200 MG tablet Take 200-1,000 mg by mouth every 6 (six) hours as needed for moderate pain.    Historical Provider, MD  ibuprofen (ADVIL,MOTRIN) 600 MG tablet Take 1 tablet (600 mg total) by mouth every 8 (eight) hours as needed. Patient not taking: Reported on 10/31/2015 05/15/15   Dorie Rank, MD  morphine (MSIR) 15 MG tablet Take 1 tablet (15 mg total) by mouth every 4 (four) hours as needed for severe pain. 10/31/15   Deno Etienne, DO  ondansetron (ZOFRAN ODT) 4 MG disintegrating tablet 4mg  ODT q4 hours prn nausea/vomit 10/31/15   Deno Etienne, DO  ondansetron (ZOFRAN) 4 MG tablet Take 1 tablet (  4 mg total) by mouth every 6 (six) hours. 02/21/16   Okey Regal, PA-C  oxyCODONE-acetaminophen (PERCOCET/ROXICET) 5-325 MG tablet Take 2 tablets by mouth every 4 (four) hours as needed for severe pain. 02/21/16   Okey Regal, PA-C    Family History Family History  Problem Relation Age of Onset  . Breast cancer Paternal Grandmother   . Breast cancer Cousin     Social History Social History  Substance Use Topics  . Smoking status: Current Every Day Smoker    Packs/day: 0.25    Types: Cigarettes  . Smokeless tobacco: Never Used  . Alcohol use Yes      Allergies   Naproxen sodium   Review of Systems Review of Systems  Constitutional: Negative for activity change, chills, diaphoresis, fatigue and fever.  HENT: Negative for congestion and rhinorrhea.   Eyes: Negative for visual disturbance.  Respiratory: Negative for cough, chest tightness, shortness of breath and stridor.   Cardiovascular: Negative for chest pain, palpitations and leg swelling.  Gastrointestinal: Positive for abdominal pain. Negative for abdominal distention, anal bleeding, blood in stool, constipation, diarrhea, nausea, rectal pain and vomiting.  Genitourinary: Positive for vaginal bleeding (chronic spotting that is the "same as always"). Negative for decreased urine volume, difficulty urinating, dysuria, flank pain, frequency, hematuria, menstrual problem, pelvic pain, urgency, vaginal discharge and vaginal pain.  Musculoskeletal: Negative for back pain and neck pain.  Skin: Negative for rash and wound.  Neurological: Negative for dizziness, weakness, light-headedness, numbness and headaches.  Psychiatric/Behavioral: Negative for agitation and confusion.  All other systems reviewed and are negative.    Physical Exam Updated Vital Signs BP (!) 156/103 (BP Location: Right Arm)   Pulse 73   Temp 98.2 F (36.8 C) (Oral)   Resp 18   Ht 5\' 7"  (1.702 m)   Wt 160 lb (72.6 kg)   LMP 06/05/2016   SpO2 100%   BMI 25.06 kg/m   Physical Exam  Constitutional: She is oriented to person, place, and time. She appears well-developed and well-nourished. No distress.  HENT:  Head: Normocephalic and atraumatic.  Right Ear: External ear normal.  Left Ear: External ear normal.  Nose: Nose normal.  Mouth/Throat: Oropharynx is clear and moist. No oropharyngeal exudate.  Eyes: Conjunctivae and EOM are normal. Pupils are equal, round, and reactive to light. No scleral icterus.  Neck: Normal range of motion. Neck supple.  Cardiovascular: Normal rate, regular rhythm,  normal heart sounds and intact distal pulses.   No murmur heard. Pulmonary/Chest: Effort normal. No stridor. No respiratory distress. She has no wheezes. She has no rales.  Abdominal: Bowel sounds are normal. She exhibits no distension. There is tenderness. There is no rebound.  Musculoskeletal: She exhibits no tenderness.  Neurological: She is alert and oriented to person, place, and time. She has normal reflexes. She exhibits normal muscle tone. Coordination normal.  Skin: Skin is warm. Capillary refill takes less than 2 seconds. No rash noted. She is not diaphoretic. No erythema.  Psychiatric: She has a normal mood and affect.  Nursing note and vitals reviewed.    ED Treatments / Results  Labs (all labs ordered are listed, but only abnormal results are displayed) Labs Reviewed  WET PREP, GENITAL - Abnormal; Notable for the following:       Result Value   Clue Cells Wet Prep HPF POC PRESENT (*)    WBC, Wet Prep HPF POC FEW (*)    All other components within normal limits  CBC WITH DIFFERENTIAL/PLATELET -  Abnormal; Notable for the following:    WBC 2.5 (*)    Neutro Abs 1.2 (*)    All other components within normal limits  COMPREHENSIVE METABOLIC PANEL - Abnormal; Notable for the following:    AST 93 (*)    ALT 64 (*)    All other components within normal limits  URINALYSIS, ROUTINE W REFLEX MICROSCOPIC - Abnormal; Notable for the following:    Color, Urine STRAW (*)    All other components within normal limits  LIPASE, BLOOD  I-STAT CG4 LACTIC ACID, ED  POC URINE PREG, ED  I-STAT CG4 LACTIC ACID, ED  GC/CHLAMYDIA PROBE AMP ([place]) NOT AT [facility]    EKG  EKG Interpretation None       Radiology US Transvaginal Non-ob  Result Date: 06/18/2016 CLINICAL DATA:  Pelvic pain for 1 day.  Endometriosis. EXAM: TRANSABDOMINAL AND TRANSVAGINAL ULTRASOUND OF PELVIS TECHNIQUE: Both transabdominal and transvaginal ultrasound examinations of the pelvis were performed.  Transabdominal technique was performed for global imaging of the pelvis including uterus, ovaries, adnexal regions, and pelvic cul-de-sac. It was necessary to proceed with endovaginal exam following the transabdominal exam to visualize the endometrium and ovaries. COMPARISON:  None FINDINGS: Uterus Measurements: 9.7 x 6.3 by 7.3 cm. Diffusely heterogeneous myometrial echotexture, without distinct fibroids. Endometrium Thickness: Indistinct endometrial-myometrial junction, with heterogeneous appearance of junctional zone and "venetian blind" appearance, suspicious for adenomyosis. Endometrial thickness could not be accurately measured. Right ovary Measurements: 4.5 x 2.3 x 2.7 cm. Normal appearance/no adnexal mass. Left ovary Measurements: 2.3 x 1.8 x 2.1 cm. Normal appearance of left ovary. 1.4 cm simple left ovarian or paraovarian cyst noted. Other findings No abnormal free fluid. IMPRESSION: Probable uterine adenomyosis.  No definite fibroids identified. 1.4 cm benign-appearing left ovarian or parovarian cyst. Electronically Signed   By: Earle Gell M.D.   On: 06/18/2016 17:58   US Pelvis Complete  Result Date: 06/18/2016 CLINICAL DATA:  Pelvic pain for 1 day.  Endometriosis. EXAM: TRANSABDOMINAL AND TRANSVAGINAL ULTRASOUND OF PELVIS TECHNIQUE: Both transabdominal and transvaginal ultrasound examinations of the pelvis were performed. Transabdominal technique was performed for global imaging of the pelvis including uterus, ovaries, adnexal regions, and pelvic cul-de-sac. It was necessary to proceed with endovaginal exam following the transabdominal exam to visualize the endometrium and ovaries. COMPARISON:  None FINDINGS: Uterus Measurements: 9.7 x 6.3 by 7.3 cm. Diffusely heterogeneous myometrial echotexture, without distinct fibroids. Endometrium Thickness: Indistinct endometrial-myometrial junction, with heterogeneous appearance of junctional zone and "venetian blind" appearance, suspicious for adenomyosis.  Endometrial thickness could not be accurately measured. Right ovary Measurements: 4.5 x 2.3 x 2.7 cm. Normal appearance/no adnexal mass. Left ovary Measurements: 2.3 x 1.8 x 2.1 cm. Normal appearance of left ovary. 1.4 cm simple left ovarian or paraovarian cyst noted. Other findings No abnormal free fluid. IMPRESSION: Probable uterine adenomyosis.  No definite fibroids identified. 1.4 cm benign-appearing left ovarian or parovarian cyst. Electronically Signed   By: Earle Gell M.D.   On: 06/18/2016 17:58   Ct Abdomen Pelvis W Contrast  Result Date: 06/18/2016 CLINICAL DATA:  46 year old female with history of right lower quadrant abdominal pain since 8:30 a.m. Pressure sensation in the pelvis. Nausea. History of endometriosis. EXAM: CT ABDOMEN AND PELVIS WITH CONTRAST TECHNIQUE: Multidetector CT imaging of the abdomen and pelvis was performed using the standard protocol following bolus administration of intravenous contrast. CONTRAST:  129mL ISOVUE-300 IOPAMIDOL (ISOVUE-300) INJECTION 61% COMPARISON:  No priors. FINDINGS: Lower chest: Areas of subsegmental atelectasis in the dependent portions of  the lower lobes bilaterally. Hepatobiliary: No cystic or solid hepatic lesions. No intra or extrahepatic biliary ductal dilatation. Gallbladder is normal in appearance. Pancreas: No pancreatic mass. No pancreatic ductal dilatation. No pancreatic or peripancreatic fluid or inflammatory changes. Spleen: Unremarkable. Adrenals/Urinary Tract: Bilateral kidneys and bilateral adrenal glands are normal in appearance. There is no hydroureteronephrosis. Urinary bladder is normal in appearance. Stomach/Bowel: The appearance of the stomach is normal. There is no pathologic dilatation of small bowel or colon. Normal appendix. Vascular/Lymphatic: Aortic atherosclerosis (mild), without evidence of aneurysm or dissection in the abdominal or pelvic vasculature. No lymphadenopathy noted in the abdomen or pelvis. Reproductive:  Incompletely visualized well-circumscribed low-attenuation lesion associated with the right site of the labia (image 64 of series 2), likely to represent a Bartholin's gland cyst. Uterus is retroflexed. Left ovary is unremarkable in appearance. Probable degenerating corpus luteum cyst in the right ovary. Other: Tiny umbilical hernia containing only omental fat. Trace volume of free fluid in the cul-de-sac, presumably physiologic in this young female patient. No larger volume of ascites. No pneumoperitoneum. Musculoskeletal: There are no aggressive appearing lytic or blastic lesions noted in the visualized portions of the skeleton. IMPRESSION: 1. No acute findings in the abdomen or pelvis to account for the patient's symptoms. 2. Specifically, the appendix is normal. 3. Right-sided Bartholin's gland cyst incompletely imaged. 4. Probable degenerating corpus luteum cyst incidentally noted in the right ovary. 5. Aortic atherosclerosis (mild). 6. Additional incidental findings, as above. Electronically Signed   By: Vinnie Langton M.D.   On: 06/18/2016 14:29    Procedures Procedures (including critical care time)  Medications Ordered in ED Medications  metroNIDAZOLE (FLAGYL) tablet 500 mg (not administered)  HYDROmorphone (DILAUDID) injection 1 mg (1 mg Intravenous Given 06/18/16 0957)  ondansetron (ZOFRAN) injection 4 mg (4 mg Intravenous Given 06/18/16 0956)  sodium chloride 0.9 % bolus 1,000 mL (0 mLs Intravenous Stopped 06/18/16 1606)  iopamidol (ISOVUE-300) 61 % injection (100 mLs  Contrast Given 06/18/16 1230)  HYDROmorphone (DILAUDID) injection 1 mg (1 mg Intravenous Given 06/18/16 1613)  ondansetron (ZOFRAN) injection 4 mg (4 mg Intravenous Given 06/18/16 1735)     Initial Impression / Assessment and Plan / ED Course  I have reviewed the triage vital signs and the nursing notes.  Pertinent labs & imaging results that were available during my care of the patient were reviewed by me and considered  in my medical decision making (see chart for details).     Teresa [name] is a 46 y.o. female with a past medical history significant for pancreatitis, asthma, endometriosis, and uterine fibroids who presents with right lower quadrant abdominal pain.  Straight and exam are seen above.   On exam, patient has severe tenderness in her right lower quadrant. Patient is tearful. She has no CVA tenderness or flank tenderness. Patient's lungs are clear. Patient has no abnormalities on leg exam. Patient had pain with any movement.   Based on patient's description that this is "different than her endometriosis pain" and affect she is pointing solely to her right lower quadrant as the area of discomfort, patient will have workup to rule out appendicitis initially. Patient pain is, nausea medicine, and fluids provided. Patient will have laboratory testing. Patient will have pre-and CT chest, urinalysis, and CT scan to look for appendicitis. If CT is negative, anticipate pelvic exam and possible ultrasound to further evaluate if this is related to ovarian cysts, fibroids, or endometriosis.  Patient's improved after pain medications. Patient had no evidence of  UTI, normal lactic acid, negative pregnancy test, CBC showed no anemia and no evidence of leukocytosis, CMP showed slightly elevated LFTs but otherwise no acute kidney injury or other electrode problems. Lipase not elevated.  CT of the abdomen and pelvis showed no acute findings aside from concern for possible corpus luteum cyst in right ovary. Appendix was normal.  Patient had return of pain after return from imaging. Patient given more pain medication and, due to continued pain, patient will have ultrasound to rule out ovarian torsion or other cysts and also have a pelvic exam to rule out PID or infection causing the pain.  Pelvic exam showed no cervical motion tenderness. No ovarian tenderness. No significant discharge. Wet prep showed evidence of BV  with clue cells. Patient given dose of Flagyl. Patient will be on to affected if the other swabs returned positive. Do not feel patient has PID given reassuring exam.  Patient's look ultrasound showed no evidence of abscess or torsion. Patient has ovarian cysts.   Patient will be discharged with prescription for pain medication, nausea medication, and Flagyl for BV. Patient given referral for OB/GYN follow-up for further management. Given strict return precautions for any new or worsening symptoms. Patient understood plan of care and was discharged in good condition.    Final Clinical Impressions(s) / ED Diagnoses   Final diagnoses:  Abdominal pain  Right lower quadrant abdominal pain  BV (bacterial vaginosis)    New Prescriptions New Prescriptions   METRONIDAZOLE (FLAGYL) 500 MG TABLET    Take 1 tablet (500 mg total) by mouth 2 (two) times daily.   ONDANSETRON (ZOFRAN) 4 MG TABLET    Take 1 tablet (4 mg total) by mouth every 8 (eight) hours as needed for nausea or vomiting.   OXYCODONE (ROXICODONE) 5 MG IMMEDIATE RELEASE TABLET    Take 1 tablet (5 mg total) by mouth every 4 (four) hours as needed for severe pain.    Clinical Impression: 1. Right lower quadrant abdominal pain   2. Abdominal pain   3. BV (bacterial vaginosis)     Disposition: Discharge  Condition: Good  I have discussed the results, Dx and Tx plan with the pt(& family if present). He/she/they expressed understanding and agree(s) with the plan. Discharge instructions discussed at great length. Strict return precautions discussed and pt &/or family have verbalized understanding of the instructions. No further questions at time of discharge.    New Prescriptions   METRONIDAZOLE (FLAGYL) 500 MG TABLET    Take 1 tablet (500 mg total) by mouth 2 (two) times daily.   ONDANSETRON (ZOFRAN) 4 MG TABLET    Take 1 tablet (4 mg total) by mouth every 8 (eight) hours as needed for nausea or vomiting.   OXYCODONE (ROXICODONE) 5  MG IMMEDIATE RELEASE TABLET    Take 1 tablet (5 mg total) by mouth every 4 (four) hours as needed for severe pain.    Follow Up: [facility] for [facility] [place] [phone] Schedule an appointment as soon as possible for a visit    [facility] [id number] [place] [phone]  If symptoms worsen     Courtney Paris, MD 06/18/16 [phone]

## 2016-06-18 NOTE — ED Triage Notes (Signed)
Pt. Reports having lower abdominal pain beginning this morning. Pt. Denies any n/v/d.  Pt. Denies any vaginal bleeding or vaginal discharge.     Pt. Is tearful.

## 2016-06-18 NOTE — ED Notes (Signed)
Pt transported to US pelvic

## 2016-06-18 NOTE — ED Notes (Signed)
Patient transported to CT 

## 2016-06-21 LAB — GC/CHLAMYDIA PROBE AMP (~~LOC~~) NOT AT ARMC
Chlamydia: NEGATIVE
Neisseria Gonorrhea: NEGATIVE

## 2016-07-07 ENCOUNTER — Ambulatory Visit (INDEPENDENT_AMBULATORY_CARE_PROVIDER_SITE_OTHER): Payer: Medicaid Other | Admitting: Family Medicine

## 2016-07-07 ENCOUNTER — Encounter: Payer: Self-pay | Admitting: Family Medicine

## 2016-07-07 DIAGNOSIS — N809 Endometriosis, unspecified: Secondary | ICD-10-CM

## 2016-07-07 DIAGNOSIS — N939 Abnormal uterine and vaginal bleeding, unspecified: Secondary | ICD-10-CM | POA: Diagnosis not present

## 2016-07-07 NOTE — Patient Instructions (Addendum)

## 2016-07-07 NOTE — Progress Notes (Signed)
Pt states that she was referred to [place] for endometriosis. She is not able to work when she is on her cycle.

## 2016-07-07 NOTE — Assessment & Plan Note (Signed)
She has not had a formal diagnosis with laparoscopy

## 2016-07-07 NOTE — Assessment & Plan Note (Addendum)
Discussed endometrial ablation, IUD etc, and other options short of hysterectomy. U/s appears to show adenomyosis. Risks of hysterectomy and 2 prior C-sections discussed. Needs EMB, but bleeding today, will return for biopsy in 2 wks.

## 2016-07-07 NOTE — Progress Notes (Signed)
   Subjective:    Patient ID: Teresa [name] is a 46 y.o. female presenting with Endometriosis and Ovarian Cyst  on 07/07/2016  HPI: Patient is referred here for treatment of endometriosis. She states found on CT. She is a [id number] with 2 prior C-sections. Noted to have fibroid prior to pregnancy. Notes that she has had increasingly heavy cycles. Scheduled for surgery with Dr. Idolina Primer for hysterectomy. Lost her insurance and did not have any surgery. She is on her cycle today. Notes that week prior to surgery, she has increasing pain, nausea. She bled 45 days last cycle. Cycles are usually 5-7 days and are heavy with clots. Notes she is bleeding through the biggest pads. Notes she is bleeding and missing work. Has tried OC's in the past but is no longer a candidate. She has tried Nexplanon and failed and stated initially she had failed IUD, but states it was the one in her arm. Reports recent pap and TFT's with PCP-[facility]. Is seeing ID for HIV.  Review of Systems  Constitutional: Negative for chills and fever.  Respiratory: Negative for shortness of breath.   Cardiovascular: Negative for chest pain.  Gastrointestinal: Negative for abdominal pain, nausea and vomiting.  Genitourinary: Negative for dysuria.  Skin: Negative for rash.      Objective:    BP (!) 133/98   Pulse 84   Ht 5\' 7"  (1.702 m)   Wt 160 lb (72.6 kg)   LMP 06/30/2016 (Exact Date)   BMI 25.06 kg/m  Physical Exam  Constitutional: She is oriented to person, place, and time. She appears well-developed and well-nourished. No distress.  HENT:  Head: Normocephalic and atraumatic.  Eyes: No scleral icterus.  Neck: Neck supple.  Cardiovascular: Normal rate.   Pulmonary/Chest: Effort normal.  Abdominal: Soft.  Neurological: She is alert and oriented to person, place, and time.  Skin: Skin is warm and dry.  Psychiatric: She has a normal mood and affect.   Ultrasound 06/18/16 Uterus Measurements:  9.7 x 6.3 by 7.3 cm. Diffusely heterogeneous myometrial echotexture, without distinct fibroids.  Endometrium Thickness: Indistinct endometrial-myometrial junction, with heterogeneous appearance of junctional zone and "venetian blind" appearance, suspicious for adenomyosis. Endometrial thickness could not be accurately measured.  Right ovary Measurements: 4.5 x 2.3 x 2.7 cm. Normal appearance/no adnexal mass.  Left ovary Measurements: 2.3 x 1.8 x 2.1 cm. Normal appearance of left ovary. 1.4 cm simple left ovarian or paraovarian cyst noted.  IMPRESSION: Probable uterine adenomyosis.  No definite fibroids identified.  1.4 cm benign-appearing left ovarian or parovarian cyst.     Assessment & Plan:   Problem List Items Addressed This Visit      Unprioritized   Abnormal uterine bleeding (AUB)    Discussed endometrial ablation, IUD etc, and other options short of hysterectomy. U/s appears to show adenomyosis. Risks of hysterectomy and 2 prior C-sections discussed. Needs EMB, but bleeding today, will return for biopsy in 2 wks.      Endometriosis    She has not had a formal diagnosis with laparoscopy        Needs ROI for pap, TSH Char[facility] care application   Total face-to-face time with patient: 30 minutes. Over 50% of encounter was spent on counseling and coordination of care.  Donnamae Jude 07/07/2016 1:39 PM

## 2016-07-21 ENCOUNTER — Ambulatory Visit (INDEPENDENT_AMBULATORY_CARE_PROVIDER_SITE_OTHER): Payer: Medicaid Other | Admitting: Family Medicine

## 2016-07-21 ENCOUNTER — Other Ambulatory Visit (HOSPITAL_COMMUNITY)
Admission: RE | Admit: 2016-07-21 | Discharge: 2016-07-21 | Disposition: A | Discharge status: Home / Self Care | Payer: Medicaid Other | Source: Ambulatory Visit | Attending: Family Medicine | Admitting: Family Medicine

## 2016-07-21 ENCOUNTER — Encounter: Payer: Self-pay | Admitting: *Deleted

## 2016-07-21 ENCOUNTER — Encounter: Payer: Self-pay | Admitting: Family Medicine

## 2016-07-21 VITALS — BP 138/105 | HR 76 | Ht 67.0 in | Wt 156.8 lb

## 2016-07-21 DIAGNOSIS — Z302 Encounter for sterilization: Secondary | ICD-10-CM | POA: Insufficient documentation

## 2016-07-21 DIAGNOSIS — Z21 Asymptomatic human immunodeficiency virus [HIV] infection status: Secondary | ICD-10-CM | POA: Diagnosis not present

## 2016-07-21 DIAGNOSIS — Z3202 Encounter for pregnancy test, result negative: Secondary | ICD-10-CM

## 2016-07-21 DIAGNOSIS — Z3009 Encounter for other general counseling and advice on contraception: Secondary | ICD-10-CM | POA: Diagnosis not present

## 2016-07-21 DIAGNOSIS — N939 Abnormal uterine and vaginal bleeding, unspecified: Secondary | ICD-10-CM

## 2016-07-21 DIAGNOSIS — B20 Human immunodeficiency virus [HIV] disease: Secondary | ICD-10-CM

## 2016-07-21 DIAGNOSIS — D500 Iron deficiency anemia secondary to blood loss (chronic): Secondary | ICD-10-CM

## 2016-07-21 LAB — POCT PREGNANCY, URINE: Preg Test, Ur: NEGATIVE

## 2016-07-21 MED ORDER — ABACAVIR-DOLUTEGRAVIR-LAMIVUD 600-50-300 MG PO TABS: 1.0000 | ORAL_TABLET | Freq: Every day | ORAL | Status: DC

## 2016-07-21 MED ORDER — MEGESTROL ACETATE 40 MG PO TABS: 40.0000 mg | ORAL_TABLET | Freq: Two times a day (BID) | ORAL | 3 refills | Status: DC

## 2016-07-21 NOTE — Progress Notes (Addendum)
   Subjective:    Patient ID: Teresa [name] is a 46 y.o. female presenting with Follow-up  on 07/21/2016  HPI: Patient is no longer bleeding. Has adenomyosis and 2 prior sections. Here for EMB due to heavy cycles.  Review of Systems  Constitutional: Negative for chills and fever.  Respiratory: Negative for shortness of breath.   Cardiovascular: Negative for chest pain.  Gastrointestinal: Negative for abdominal pain, nausea and vomiting.  Genitourinary: Negative for dysuria.  Skin: Negative for rash.      Objective:    BP (!) 138/105   Pulse 76   Ht 5\' 7"  (1.702 m)   Wt 156 lb 12.8 oz (71.1 kg)   LMP 07/18/2016 (Exact Date)   BMI 24.56 kg/m  Physical Exam  Constitutional: She is oriented to person, place, and time. She appears well-developed and well-nourished. No distress.  HENT:  Head: Normocephalic and atraumatic.  Eyes: No scleral icterus.  Neck: Neck supple.  Cardiovascular: Normal rate.   Pulmonary/Chest: Effort normal.  Abdominal: Soft.  Genitourinary:  Genitourinary Comments: BUS normal, vagina is pink and rugated, cervix is multiparous without lesion   Neurological: She is alert and oriented to person, place, and time.  Skin: Skin is warm and dry.  Psychiatric: She has a normal mood and affect.   Procedure: Patient given informed consent, signed copy in the chart, time out was performed. Appropriate time out taken. . The patient was placed in the lithotomy position and the cervix brought into view with sterile speculum.  Portio of cervix cleansed x 2 with betadine swabs.  A tenaculum was placed in the anterior lip of the cervix.  The uterus was sounded for depth of 9. A pipelle was introduced to into the uterus, suction created,  and an endometrial sample was obtained. All equipment was removed and accounted for.  The patient tolerated the procedure well.      Assessment & Plan:   Problem List Items Addressed This Visit      Unprioritized   Abnormal uterine  bleeding (AUB) - Primary    Place on Megace and desires endometrial ablation.      Relevant Medications   megestrol (MEGACE) 40 MG tablet   Other Relevant Orders   Surgical pathology   HIV disease ([place])   Relevant Medications   abacavir-dolutegravir-lamiVUDine (TRIUMEQ) 600-50-300 MG tablet   Iron deficiency anemia due to chronic blood loss   Sterilization consult    She has completed childbearing and with endometrial ablation needs BTL--sign papers and book for surgery.       Surgical treatment options discussed at length. She has tried and failed many medical options and desires surgical treatment. Risks include but are not limited to bleeding, infection, injury to surrounding structures, including bowel, bladder and ureters, blood clots, and death.  Likelihood of success is high.   Total face-to-face time with patient: 25 minutes. Over 50% of encounter was spent on counseling and coordination of care. Return in about 3 months (around 10/21/2016) for postop check.  Donnamae Jude 07/21/2016 11:17 AM

## 2016-07-21 NOTE — Assessment & Plan Note (Signed)
Place on Megace and desires endometrial ablation.

## 2016-07-21 NOTE — Patient Instructions (Signed)

## 2016-07-21 NOTE — Assessment & Plan Note (Signed)
She has completed childbearing and with endometrial ablation needs BTL--sign papers and book for surgery.

## 2016-07-21 NOTE — Addendum Note (Signed)
Addended by: Donnamae Jude on: 07/21/2016 04:08 PM   Modules accepted: Level of Service

## 2016-07-22 ENCOUNTER — Encounter (HOSPITAL_COMMUNITY): Payer: Self-pay

## 2016-07-27 ENCOUNTER — Telehealth: Payer: Self-pay | Admitting: *Deleted

## 2016-07-27 NOTE — Telephone Encounter (Signed)
Called pt and informed her of normal endometrial biopsy result.  Pt voiced understanding and had no questions.

## 2016-08-13 ENCOUNTER — Encounter (HOSPITAL_COMMUNITY)
Admission: RE | Admit: 2016-08-13 | Discharge: 2016-08-13 | Disposition: A | Discharge status: Home / Self Care | Payer: Medicaid Other | Source: Ambulatory Visit | Attending: Family Medicine | Admitting: Family Medicine

## 2016-08-13 ENCOUNTER — Encounter (HOSPITAL_COMMUNITY): Payer: Self-pay

## 2016-08-13 DIAGNOSIS — N939 Abnormal uterine and vaginal bleeding, unspecified: Secondary | ICD-10-CM | POA: Diagnosis not present

## 2016-08-13 DIAGNOSIS — D500 Iron deficiency anemia secondary to blood loss (chronic): Secondary | ICD-10-CM | POA: Diagnosis not present

## 2016-08-13 DIAGNOSIS — B20 Human immunodeficiency virus [HIV] disease: Secondary | ICD-10-CM | POA: Diagnosis not present

## 2016-08-13 DIAGNOSIS — Z01818 Encounter for other preprocedural examination: Secondary | ICD-10-CM | POA: Insufficient documentation

## 2016-08-13 HISTORY — DX: Anemia, unspecified: D64.9

## 2016-08-13 HISTORY — DX: Headache, unspecified: R51.9

## 2016-08-13 HISTORY — DX: Anxiety disorder, unspecified: F41.9

## 2016-08-13 HISTORY — DX: Other seasonal allergic rhinitis: J30.2

## 2016-08-13 HISTORY — DX: Major depressive disorder, single episode, unspecified: F32.9

## 2016-08-13 HISTORY — DX: Headache: R51

## 2016-08-13 HISTORY — DX: Inflammatory liver disease, unspecified: K75.9

## 2016-08-13 HISTORY — DX: Bipolar disorder, unspecified: F31.9

## 2016-08-13 HISTORY — DX: Depression, unspecified: F32.A

## 2016-08-13 HISTORY — DX: Gastro-esophageal reflux disease without esophagitis: K21.9

## 2016-08-13 LAB — CBC
HCT: 35.6 % — ABNORMAL LOW (ref 36.0–46.0)
Hemoglobin: 11.7 g/dL — ABNORMAL LOW (ref 12.0–15.0)
MCH: 28.1 pg (ref 26.0–34.0)
MCHC: 32.9 g/dL (ref 30.0–36.0)
MCV: 85.4 fL (ref 78.0–100.0)
Platelets: 213 10*3/uL (ref 150–400)
RBC: 4.17 MIL/uL (ref 3.87–5.11)
RDW: 13.4 % (ref 11.5–15.5)
WBC: 3.2 10*3/uL — ABNORMAL LOW (ref 4.0–10.5)

## 2016-08-13 NOTE — Patient Instructions (Addendum)
Your procedure is scheduled on:  Wednesday, May 2  Enter through the Main Entrance of [facility] at: 11:30 am  Pick up the phone at the desk and dial [phone].  Call this number if you have problems the morning of surgery:[phone].  Remember: Do NOT eat food after midnight Tuesday. Do NOT drink clear liquids after: 9 am Wednesday, day of surgery  Take these medicines the morning of surgery with a SIP OF WATER:  Triumeq, buspar, meloxicam, protonix.  May use flonase nasal spray and claritin if needed.   Bring albuterol inhaler with you on day of surgery.  Do Not smoke on day of surgery.  Do NOT wear jewelry (body piercing), metal hair clips/bobby pins, make-up, or nail polish. Do NOT wear lotions, powders, or perfumes.  You may wear deoderant. Do NOT shave for 48 hours prior to surgery. Do NOT bring valuables to the hospital.  . Have a responsible adult drive you home and stay with you for 24 hours after your procedure.  Home with Gerald St[employer] cell - [phone].

## 2016-08-23 ENCOUNTER — Telehealth (HOSPITAL_COMMUNITY): Payer: Self-pay

## 2016-08-23 ENCOUNTER — Telehealth: Payer: Self-pay | Admitting: General Practice

## 2016-08-23 NOTE — Telephone Encounter (Signed)
Called patient on 08/23/2016 @1238  to let her know that her surgery scheduled for 08/25/2016 has been cancelled due to invalid BTL papers. No answer left voicemail instructing patient to return my call at the office.

## 2016-08-23 NOTE — Telephone Encounter (Signed)
Called patient regarding incorrectly completed BTL papers. Patient verbalized understanding and states she will come tomorrow to sign papers. Patient asked why this wasn't caught sooner. Discussed with patient that a few days before surgery someone is usually looking at the financial aspect making sure everything is in place and they noticed it. Patient verbalized understanding and had no questions

## 2016-08-24 ENCOUNTER — Encounter (HOSPITAL_COMMUNITY): Payer: Self-pay

## 2016-09-28 NOTE — H&P (Signed)
Teresa [name] is an 46 y.o. [id number] female.   Chief Complaint: abnormal bleeding and desires sterilization HPI: Has h/o abnormal bleeding and has tried and failed Nexplanon and OC's. She desires definitive treatment with ablation. She has h/o C-section x 2. Needs sterilization with endometrial ablation.  Past Medical History:  Diagnosis Date  . Anemia   . Anxiety   . Asthma    uses inhaler 2-3 x week  . Bipolar disorder ([place])   . Depression   . Endometriosis   . Fibroids   . GERD (gastroesophageal reflux disease)   . Headache   . Hepatitis 2009   Hep C  . HIV infection ([place])   . Pancreatitis   . Seasonal allergies   . SVD (spontaneous vaginal delivery)    x 1    Past Surgical History:  Procedure Laterality Date  . CESAREAN SECTION     x2  . DILATION AND CURETTAGE OF UTERUS     x 2 - TAB  . HAND SURGERY Right    2 pins in wrist  . WISDOM TOOTH EXTRACTION      Family History  Problem Relation Age of Onset  . Breast cancer Paternal Grandmother   . Breast cancer Cousin    Social History:  reports that she has been smoking Cigarettes.  She has a 5.00 pack-year smoking history. She has never used smokeless tobacco. She reports that she drinks alcohol. She reports that she uses drugs, including Marijuana.  Allergies:  Allergies  Allergen Reactions  . Naproxen Sodium Hives    Can take ibuprofen without causing any hives    No current facility-administered medications on file prior to encounter.    Current Outpatient Prescriptions on File Prior to Encounter  Medication Sig Dispense Refill  . abacavir-dolutegravir-lamiVUDine (TRIUMEQ) 600-50-300 MG tablet Take 1 tablet by mouth daily. 30 tablet   . megestrol (MEGACE) 40 MG tablet Take 1 tablet (40 mg total) by mouth 2 (two) times daily. 60 tablet 3  . meloxicam (MOBIC) 7.5 MG tablet Take 7.5 mg by mouth 2 (two) times daily.    . Vitamin D, Ergocalciferol, (DRISDOL) 50000 units CAPS capsule Take 50,000 Units by mouth  every 7 (seven) days.    Marland Kitchen albuterol (PROAIR HFA) 108 (90 Base) MCG/ACT inhaler Inhale 2 puffs into the lungs every 6 (six) hours as needed for wheezing or shortness of breath.    . cetirizine (ZYRTEC) 10 MG tablet Take 10 mg by mouth daily as needed for allergies.    . fluticasone (FLONASE) 50 MCG/ACT nasal spray Place 1 spray into both nostrils daily as needed for allergies or rhinitis.    Marland Kitchen oxyCODONE (ROXICODONE) 5 MG immediate release tablet Take 1 tablet (5 mg total) by mouth every 4 (four) hours as needed for severe pain. (Patient not taking: Reported on 07/21/2016) 18 tablet 0  . pantoprazole (PROTONIX) 40 MG tablet Take 40 mg by mouth 2 (two) times daily.      A comprehensive review of systems was negative.  Height 5\' 5"  (1.651 m), weight 160 lb (72.6 kg). General appearance: alert, cooperative and appears stated age Head: Normocephalic, without obvious abnormality, atraumatic Neck: supple, symmetrical, trachea midline Lungs: normal effort Heart: regular rate and rhythm Abdomen: soft, non-tender; bowel sounds normal; no masses,  no organomegaly Extremities: Homans sign is negative, no sign of DVT Skin: Skin color, texture, turgor normal. No rashes or lesions Neurologic: Grossly normal   Lab Results  Component Value Date   WBC 3.2 (  L) 08/13/2016   HGB 11.7 (L) 08/13/2016   HCT 35.6 (L) 08/13/2016   MCV 85.4 08/13/2016   PLT 213 08/13/2016   Lab Results  Component Value Date   PREGTESTUR NEGATIVE 07/21/2016   HCG <5.0 02/21/2016     Assessment/Plan Principal Problem:   Abnormal uterine bleeding (AUB) Active Problems:   Encounter for sterilization  For laparoscopic BTL with endometrial ablation using HTA. Risks include but are not limited to bleeding, infection, injury to surrounding structures, including bowel, bladder and ureters, blood clots, and death.  Likelihood of success is high.    Donnamae Jude 09/28/2016, 9:22 AM

## 2016-09-30 NOTE — Patient Instructions (Addendum)
Your procedure is scheduled on:  Monday, October 11, 2016  Enter through the Main Entrance of [facility] at:  11:15 AM  Pick up the phone at the desk and dial [phone].  Call this number if you have problems the morning of surgery: [phone].  Remember: Do NOT eat food:  After Midnight Sunday  Do NOT drink clear liquids after:  8:30 AM day of surgery  Take these medicines the morning of surgery with a SIP OF WATER:  Triumeq, Buspirone, Protonix  Bring Asthma Inhaler day of surgery  Stop ALL herbal medications at this time  Do NOT smoke the day of surgery.  Do NOT wear jewelry (body piercing), metal hair clips/bobby pins, make-up, or nail polish. Do NOT wear lotions, powders, or perfumes.  You may wear deodorant. Do NOT shave for 48 hours prior to surgery. Do NOT bring valuables to the hospital. Contacts, dentures, or bridgework may not be worn into surgery.  Have a responsible adult drive you home and stay with you for 24 hours after your procedure  Bring a copy of your healthcare power of attorney and living will documents.

## 2016-10-04 ENCOUNTER — Telehealth: Payer: Self-pay | Admitting: General Practice

## 2016-10-04 ENCOUNTER — Encounter (HOSPITAL_COMMUNITY)
Admission: RE | Admit: 2016-10-04 | Discharge: 2016-10-04 | Disposition: A | Discharge status: Home / Self Care | Payer: Medicaid Other | Source: Ambulatory Visit | Attending: Family Medicine | Admitting: Family Medicine

## 2016-10-04 ENCOUNTER — Other Ambulatory Visit: Payer: Self-pay | Admitting: Family Medicine

## 2016-10-04 ENCOUNTER — Encounter (HOSPITAL_COMMUNITY): Payer: Self-pay

## 2016-10-04 DIAGNOSIS — B20 Human immunodeficiency virus [HIV] disease: Secondary | ICD-10-CM | POA: Diagnosis not present

## 2016-10-04 DIAGNOSIS — N809 Endometriosis, unspecified: Secondary | ICD-10-CM | POA: Insufficient documentation

## 2016-10-04 DIAGNOSIS — D500 Iron deficiency anemia secondary to blood loss (chronic): Secondary | ICD-10-CM | POA: Insufficient documentation

## 2016-10-04 DIAGNOSIS — Z01812 Encounter for preprocedural laboratory examination: Secondary | ICD-10-CM | POA: Insufficient documentation

## 2016-10-04 DIAGNOSIS — G43909 Migraine, unspecified, not intractable, without status migrainosus: Secondary | ICD-10-CM | POA: Insufficient documentation

## 2016-10-04 DIAGNOSIS — B1920 Unspecified viral hepatitis C without hepatic coma: Secondary | ICD-10-CM | POA: Insufficient documentation

## 2016-10-04 DIAGNOSIS — N939 Abnormal uterine and vaginal bleeding, unspecified: Secondary | ICD-10-CM | POA: Insufficient documentation

## 2016-10-04 HISTORY — DX: Unspecified osteoarthritis, unspecified site: M19.90

## 2016-10-04 LAB — CBC
HCT: 40.7 % (ref 36.0–46.0)
Hemoglobin: 13.6 g/dL (ref 12.0–15.0)
MCH: 27.4 pg (ref 26.0–34.0)
MCHC: 33.4 g/dL (ref 30.0–36.0)
MCV: 81.9 fL (ref 78.0–100.0)
Platelets: 188 10*3/uL (ref 150–400)
RBC: 4.97 MIL/uL (ref 3.87–5.11)
RDW: 13.8 % (ref 11.5–15.5)
WBC: 3.7 10*3/uL — ABNORMAL LOW (ref 4.0–10.5)

## 2016-10-04 LAB — TYPE AND SCREEN
ABO/RH(D): O POS
Antibody Screen: NEGATIVE

## 2016-10-04 LAB — ABO/RH: ABO/RH(D): O POS

## 2016-10-04 MED ORDER — HYDROCHLOROTHIAZIDE 25 MG PO TABS: 25.0000 mg | ORAL_TABLET | Freq: Every day | ORAL | 3 refills | Status: DC

## 2016-10-04 NOTE — Pre-Procedure Instructions (Signed)
Called Ms. Lilia Pro to make her aware that Dr. Kennon Rounds had called her in a prescription.

## 2016-10-04 NOTE — Pre-Procedure Instructions (Signed)
I sent Dr. Kennon Rounds an inbox message in reference to Ms. Morgan's elevated blood pressure.  Three unsuccessful attempts  to call pharmacy for medication reconciliation during pre op appointment.

## 2016-10-04 NOTE — Telephone Encounter (Signed)
Called patient and informed her of BP medication at pharmacy that she should pick up and start today per dr Kennon Rounds due to her BP being elevated at pre op appt. Patient verbalized understanding to all & had no questions

## 2016-10-04 NOTE — Progress Notes (Unsigned)
BP elevated needs to come down prior to surgery. Please call patient and let her know I have sent in an rx for her BP. She will need to f/u with PCP after surgery to see how it is working.

## 2016-10-05 NOTE — Progress Notes (Signed)
Spoke with patient informed her she would need to come down prior to having surgery 1-2 days before to make sure her blood pressure has come down.

## 2016-10-08 ENCOUNTER — Ambulatory Visit: Payer: Medicaid Other | Admitting: *Deleted

## 2016-10-08 VITALS — BP 170/113 | HR 68

## 2016-10-08 DIAGNOSIS — Z0130 Encounter for examination of blood pressure without abnormal findings: Secondary | ICD-10-CM

## 2016-10-08 MED ORDER — HYDROCHLOROTHIAZIDE 25 MG PO TABS: 50.0000 mg | ORAL_TABLET | Freq: Every day | ORAL | 3 refills | Status: DC

## 2016-10-08 NOTE — Progress Notes (Signed)
Pr presented for BP check as scheduled.  She stated that she has stopped smoking cigarettes x3 days. Pt is scheduled for surgery on 6/18.  BP checked twice and values reported to Dr. Hulan Fray. Per Dr. Hulan Fray, pt advised to increase dose of HCTZ to 50 mg once daily. She should arrive to hospital on 6/18 for scheduled surgery. Pt also informed that there is a possibility her surgery could get postponed and this will be decided after evaluation on 6/18.  Pt voiced understanding of all instructions and information given.

## 2016-10-11 ENCOUNTER — Ambulatory Visit (HOSPITAL_COMMUNITY): Payer: Medicaid Other | Admitting: Anesthesiology

## 2016-10-11 ENCOUNTER — Ambulatory Visit (HOSPITAL_COMMUNITY)
Admission: RE | Admit: 2016-10-11 | Discharge: 2016-10-11 | Disposition: A | Discharge status: Home / Self Care | Payer: Medicaid Other | Source: Ambulatory Visit | Attending: Family Medicine | Admitting: Family Medicine

## 2016-10-11 ENCOUNTER — Encounter (HOSPITAL_COMMUNITY): Payer: Self-pay

## 2016-10-11 ENCOUNTER — Encounter (HOSPITAL_COMMUNITY)
Admission: RE | Disposition: A | Discharge status: Home / Self Care | Payer: Self-pay | Source: Ambulatory Visit | Attending: Family Medicine

## 2016-10-11 DIAGNOSIS — Z79899 Other long term (current) drug therapy: Secondary | ICD-10-CM | POA: Diagnosis not present

## 2016-10-11 DIAGNOSIS — K219 Gastro-esophageal reflux disease without esophagitis: Secondary | ICD-10-CM | POA: Diagnosis not present

## 2016-10-11 DIAGNOSIS — D649 Anemia, unspecified: Secondary | ICD-10-CM | POA: Insufficient documentation

## 2016-10-11 DIAGNOSIS — F17210 Nicotine dependence, cigarettes, uncomplicated: Secondary | ICD-10-CM | POA: Insufficient documentation

## 2016-10-11 DIAGNOSIS — B1920 Unspecified viral hepatitis C without hepatic coma: Secondary | ICD-10-CM | POA: Diagnosis not present

## 2016-10-11 DIAGNOSIS — N939 Abnormal uterine and vaginal bleeding, unspecified: Secondary | ICD-10-CM | POA: Diagnosis present

## 2016-10-11 DIAGNOSIS — J45909 Unspecified asthma, uncomplicated: Secondary | ICD-10-CM | POA: Insufficient documentation

## 2016-10-11 DIAGNOSIS — Z641 Problems related to multiparity: Secondary | ICD-10-CM | POA: Diagnosis not present

## 2016-10-11 DIAGNOSIS — N920 Excessive and frequent menstruation with regular cycle: Secondary | ICD-10-CM | POA: Insufficient documentation

## 2016-10-11 DIAGNOSIS — F419 Anxiety disorder, unspecified: Secondary | ICD-10-CM | POA: Insufficient documentation

## 2016-10-11 DIAGNOSIS — Z302 Encounter for sterilization: Secondary | ICD-10-CM | POA: Insufficient documentation

## 2016-10-11 DIAGNOSIS — F319 Bipolar disorder, unspecified: Secondary | ICD-10-CM | POA: Insufficient documentation

## 2016-10-11 HISTORY — PX: LAPAROSCOPIC TUBAL LIGATION: SHX1937

## 2016-10-11 HISTORY — PX: HYSTEROSCOPY: SHX211

## 2016-10-11 LAB — PREGNANCY, URINE: Preg Test, Ur: NEGATIVE

## 2016-10-11 SURGERY — ABLATION, ENDOMETRIUM, HYSTEROSCOPIC
Anesthesia: General | Site: Vagina

## 2016-10-11 MED ORDER — LABETALOL HCL 5 MG/ML IV SOLN
INTRAVENOUS | Status: DC | PRN
  Administered 2016-10-11: 10 mg via INTRAVENOUS
  Administered 2016-10-11: 20 mg via INTRAVENOUS
  Administered 2016-10-11 (×3): 10 mg via INTRAVENOUS

## 2016-10-11 MED ORDER — LABETALOL HCL 5 MG/ML IV SOLN
INTRAVENOUS | Status: AC
  Filled 2016-10-11: qty 4

## 2016-10-11 MED ORDER — SODIUM CHLORIDE 0.9 % IR SOLN
Status: DC | PRN
  Administered 2016-10-11: 3000 mL

## 2016-10-11 MED ORDER — OXYCODONE HCL 5 MG PO TABS
5.0000 mg | ORAL_TABLET | Freq: Once | ORAL | Status: AC | PRN
  Administered 2016-10-11: 5 mg via ORAL

## 2016-10-11 MED ORDER — BUPIVACAINE-EPINEPHRINE 0.25% -1:200000 IJ SOLN
INTRAMUSCULAR | Status: DC | PRN
  Administered 2016-10-11: 20 mL

## 2016-10-11 MED ORDER — LACTATED RINGERS IV SOLN
INTRAVENOUS | Status: DC
  Administered 2016-10-11 (×2): via INTRAVENOUS

## 2016-10-11 MED ORDER — LIDOCAINE HCL (CARDIAC) 20 MG/ML IV SOLN
INTRAVENOUS | Status: DC | PRN
  Administered 2016-10-11: 50 mg via INTRAVENOUS

## 2016-10-11 MED ORDER — BUPIVACAINE HCL (PF) 0.25 % IJ SOLN
INTRAMUSCULAR | Status: AC
  Filled 2016-10-11: qty 30

## 2016-10-11 MED ORDER — FENTANYL CITRATE (PF) 250 MCG/5ML IJ SOLN
INTRAMUSCULAR | Status: AC
Start: 2016-10-11 — End: ?
  Filled 2016-10-11: qty 5

## 2016-10-11 MED ORDER — ROCURONIUM BROMIDE 100 MG/10ML IV SOLN
INTRAVENOUS | Status: DC | PRN
  Administered 2016-10-11: 20 mg via INTRAVENOUS
  Administered 2016-10-11: 30 mg via INTRAVENOUS

## 2016-10-11 MED ORDER — ACETAMINOPHEN 160 MG/5ML PO SOLN: 325.0000 mg | ORAL | Status: DC | PRN

## 2016-10-11 MED ORDER — BUPIVACAINE HCL (PF) 0.25 % IJ SOLN
INTRAMUSCULAR | Status: DC | PRN
  Administered 2016-10-11: 10 mL

## 2016-10-11 MED ORDER — FENTANYL CITRATE (PF) 100 MCG/2ML IJ SOLN
INTRAMUSCULAR | Status: DC | PRN
  Administered 2016-10-11 (×2): 100 ug via INTRAVENOUS
  Administered 2016-10-11: 150 ug via INTRAVENOUS

## 2016-10-11 MED ORDER — FENTANYL CITRATE (PF) 100 MCG/2ML IJ SOLN
25.0000 ug | INTRAMUSCULAR | Status: DC | PRN
  Administered 2016-10-11 (×2): 50 ug via INTRAVENOUS

## 2016-10-11 MED ORDER — ACETAMINOPHEN 325 MG PO TABS: 325.0000 mg | ORAL_TABLET | ORAL | Status: DC | PRN

## 2016-10-11 MED ORDER — MIDAZOLAM HCL 2 MG/2ML IJ SOLN
INTRAMUSCULAR | Status: AC
  Filled 2016-10-11: qty 2

## 2016-10-11 MED ORDER — MEPERIDINE HCL 25 MG/ML IJ SOLN: 6.2500 mg | INTRAMUSCULAR | Status: DC | PRN

## 2016-10-11 MED ORDER — DEXAMETHASONE SODIUM PHOSPHATE 4 MG/ML IJ SOLN
INTRAMUSCULAR | Status: AC
  Filled 2016-10-11: qty 1

## 2016-10-11 MED ORDER — SCOPOLAMINE 1 MG/3DAYS TD PT72
1.0000 | MEDICATED_PATCH | Freq: Once | TRANSDERMAL | Status: DC
  Administered 2016-10-11: 1.5 mg via TRANSDERMAL

## 2016-10-11 MED ORDER — ONDANSETRON HCL 4 MG/2ML IJ SOLN
INTRAMUSCULAR | Status: AC
  Filled 2016-10-11: qty 2

## 2016-10-11 MED ORDER — LIDOCAINE HCL (CARDIAC) 20 MG/ML IV SOLN
INTRAVENOUS | Status: AC
  Filled 2016-10-11: qty 5

## 2016-10-11 MED ORDER — PROPOFOL 10 MG/ML IV BOLUS
INTRAVENOUS | Status: DC | PRN
  Administered 2016-10-11: 200 mg via INTRAVENOUS

## 2016-10-11 MED ORDER — PROPOFOL 10 MG/ML IV BOLUS
INTRAVENOUS | Status: AC
  Filled 2016-10-11: qty 20

## 2016-10-11 MED ORDER — PROMETHAZINE HCL 25 MG/ML IJ SOLN
INTRAMUSCULAR | Status: AC
  Filled 2016-10-11: qty 1

## 2016-10-11 MED ORDER — OXYCODONE-ACETAMINOPHEN 5-325 MG PO TABS: 1.0000 | ORAL_TABLET | Freq: Four times a day (QID) | ORAL | 0 refills | Status: DC | PRN

## 2016-10-11 MED ORDER — SCOPOLAMINE 1 MG/3DAYS TD PT72
MEDICATED_PATCH | TRANSDERMAL | Status: AC
  Administered 2016-10-11: 1.5 mg via TRANSDERMAL
  Filled 2016-10-11: qty 1

## 2016-10-11 MED ORDER — HYDRALAZINE HCL 20 MG/ML IJ SOLN
INTRAMUSCULAR | Status: AC
  Filled 2016-10-11: qty 1

## 2016-10-11 MED ORDER — HYDRALAZINE HCL 20 MG/ML IJ SOLN
INTRAMUSCULAR | Status: DC | PRN
  Administered 2016-10-11: 5 mg via INTRAVENOUS

## 2016-10-11 MED ORDER — LABETALOL HCL 5 MG/ML IV SOLN
INTRAVENOUS | Status: AC
  Filled 2016-10-11: qty 8

## 2016-10-11 MED ORDER — SUGAMMADEX SODIUM 500 MG/5ML IV SOLN
INTRAVENOUS | Status: DC | PRN
Start: 2016-10-11 — End: 2016-10-11
  Administered 2016-10-11: 250 mg via INTRAVENOUS

## 2016-10-11 MED ORDER — OXYCODONE HCL 5 MG PO TABS
ORAL_TABLET | ORAL | Status: AC
  Filled 2016-10-11: qty 1

## 2016-10-11 MED ORDER — PROMETHAZINE HCL 25 MG/ML IJ SOLN
12.5000 mg | Freq: Once | INTRAMUSCULAR | Status: AC
  Administered 2016-10-11: 12.5 mg via INTRAVENOUS

## 2016-10-11 MED ORDER — OXYCODONE HCL 5 MG/5ML PO SOLN: 5.0000 mg | Freq: Once | ORAL | Status: AC | PRN

## 2016-10-11 MED ORDER — SUGAMMADEX SODIUM 500 MG/5ML IV SOLN
INTRAVENOUS | Status: AC
  Filled 2016-10-11: qty 5

## 2016-10-11 MED ORDER — DEXAMETHASONE SODIUM PHOSPHATE 10 MG/ML IJ SOLN
INTRAMUSCULAR | Status: DC | PRN
  Administered 2016-10-11: 4 mg via INTRAVENOUS

## 2016-10-11 MED ORDER — PANTOPRAZOLE SODIUM 40 MG PO TBEC: 40.0000 mg | DELAYED_RELEASE_TABLET | Freq: Two times a day (BID) | ORAL | 2 refills | Status: DC

## 2016-10-11 MED ORDER — ONDANSETRON HCL 4 MG/2ML IJ SOLN
INTRAMUSCULAR | Status: DC | PRN
  Administered 2016-10-11: 4 mg via INTRAVENOUS

## 2016-10-11 MED ORDER — ONDANSETRON HCL 4 MG/2ML IJ SOLN
4.0000 mg | Freq: Once | INTRAMUSCULAR | Status: AC | PRN
  Administered 2016-10-11: 4 mg via INTRAVENOUS

## 2016-10-11 MED ORDER — FENTANYL CITRATE (PF) 100 MCG/2ML IJ SOLN
INTRAMUSCULAR | Status: AC
  Filled 2016-10-11: qty 2

## 2016-10-11 MED ORDER — MIDAZOLAM HCL 2 MG/2ML IJ SOLN
INTRAMUSCULAR | Status: DC | PRN
  Administered 2016-10-11: 2 mg via INTRAVENOUS

## 2016-10-11 MED ORDER — BUPIVACAINE-EPINEPHRINE (PF) 0.25% -1:200000 IJ SOLN
INTRAMUSCULAR | Status: AC
  Filled 2016-10-11: qty 30

## 2016-10-11 MED ORDER — LACTATED RINGERS IV SOLN
INTRAVENOUS | Status: DC
  Administered 2016-10-11: 1000 mL via INTRAVENOUS

## 2016-10-11 SURGICAL SUPPLY — 27 items
CANISTER SUCT 3000ML PPV (MISCELLANEOUS) ×3 IMPLANT
CATH ROBINSON RED A/P 16FR (CATHETERS) ×3 IMPLANT
CLIP FILSHIE TUBAL LIGA STRL (Clip) ×9 IMPLANT
CLOTH BEACON ORANGE TIMEOUT ST (SAFETY) ×3 IMPLANT
CONTAINER PREFILL 10% NBF 60ML (FORM) ×6 IMPLANT
DRSG OPSITE POSTOP 3X4 (GAUZE/BANDAGES/DRESSINGS) ×3 IMPLANT
DURAPREP 26ML APPLICATOR (WOUND CARE) ×3 IMPLANT
GLOVE BIOGEL PI IND STRL 7.0 (GLOVE) ×6 IMPLANT
GLOVE BIOGEL PI INDICATOR 7.0 (GLOVE) ×3
GLOVE ECLIPSE 7.0 STRL STRAW (GLOVE) ×3 IMPLANT
GOWN STRL REUS W/TWL LRG LVL3 (GOWN DISPOSABLE) ×9 IMPLANT
PACK LAPAROSCOPY BASIN (CUSTOM PROCEDURE TRAY) ×3 IMPLANT
PACK TRENDGUARD 450 HYBRID PRO (MISCELLANEOUS) IMPLANT
PACK TRENDGUARD 600 HYBRD PROC (MISCELLANEOUS) IMPLANT
PACK VAGINAL MINOR WOMEN LF (CUSTOM PROCEDURE TRAY) ×3 IMPLANT
PAD OB MATERNITY 4.3X12.25 (PERSONAL CARE ITEMS) ×6 IMPLANT
PROTECTOR NERVE ULNAR (MISCELLANEOUS) ×6 IMPLANT
SET GENESYS HTA PROCERVA (MISCELLANEOUS) ×3 IMPLANT
SLEEVE XCEL OPT CAN 5 100 (ENDOMECHANICALS) ×3 IMPLANT
SUT VIC AB 3-0 X1 27 (SUTURE) ×3 IMPLANT
SUT VICRYL 0 UR6 27IN ABS (SUTURE) ×6 IMPLANT
TOWEL OR 17X24 6PK STRL BLUE (TOWEL DISPOSABLE) ×6 IMPLANT
TRENDGUARD 450 HYBRID PRO PACK (MISCELLANEOUS)
TRENDGUARD 600 HYBRID PROC PK (MISCELLANEOUS)
TROCAR BALLN 12MMX100 BLUNT (TROCAR) ×3 IMPLANT
TROCAR XCEL NON-BLD 5MMX100MML (ENDOMECHANICALS) ×3 IMPLANT
WARMER LAPAROSCOPE (MISCELLANEOUS) ×3 IMPLANT

## 2016-10-11 NOTE — Interval H&P Note (Signed)
History and Physical Interval Note:  10/11/2016 12:25 PM  [name]  has presented today for surgery, with the diagnosis of AUB Undesired Fertility  The various methods of treatment have been discussed with the patient and family. After consideration of risks, benefits and other options for treatment, the patient has consented to  Procedure(s) with comments: HYSTEROSCOPY WITH HYDROTHERMAL ABLATION (N/A) - [name] the HTA rep will be here.  Confirmed on 09/02/16.  LAPAROSCOPIC TUBAL LIGATION (Bilateral) as a surgical intervention .  The patient's history has been reviewed, patient examined, no change in status, stable for surgery.  I have reviewed the patient's chart and labs.  Questions were answered to the patient's satisfaction.  BP remains elevated and she quit smoking and she started HCTZ and she is having a lot salt.    Teresa [name]

## 2016-10-11 NOTE — Transfer of Care (Signed)
Immediate Anesthesia Transfer of Care Note  Patient: Teresa [name]  Procedure(s) Performed: Procedure(s) with comments: HYSTEROSCOPY WITH HYDROTHERMAL ABLATION (N/A) - [name] the HTA rep will be here.  Confirmed on 09/02/16.  LAPAROSCOPIC TUBAL LIGATION WITH FILSHIE CLIPS (Bilateral)  Patient Location: PACU  Anesthesia Type:General  Level of Consciousness: awake, alert  and oriented  Airway & Oxygen Therapy: Patient Spontanous Breathing and Patient connected to nasal cannula oxygen  Post-op Assessment: Report given to RN  Post vital signs: Reviewed Patient Hypertensive, given labetalol 20 mg IV and 5 mg hydralazine IV as per Dr. Jillyn Hidden. Last Vitals:  Vitals:   10/11/16 1115  BP: (!) 173/115  Pulse: 79  Resp: 16  Temp: 36.7 C    Last Pain: There were no vitals filed for this visit.    Patients Stated Pain Goal: 4 (02/89/02 2840)  Complications: No apparent anesthesia complications

## 2016-10-11 NOTE — Discharge Instructions (Addendum)
Laparoscopic Tubal Ligation, Care After Refer to this sheet in the next few weeks. These instructions provide you with information about caring for yourself after your procedure. Your health care provider may also give you more specific instructions. Your treatment has been planned according to current medical practices, but problems sometimes occur. Call your health care provider if you have any problems or questions after your procedure. What can I expect after the procedure? After the procedure, it is common to have:  A sore throat.  Discomfort in your shoulder.  Mild discomfort or cramping in your abdomen.  Gas pains.  Pain or soreness in the area where the surgical cut (incision) was made.  A bloated feeling.  Tiredness.  Nausea.  Vomiting.  Follow these instructions at home: Medicines  Take over-the-counter and prescription medicines only as told by your health care provider.  Do not take aspirin because it can cause bleeding.  Do not drive or operate heavy machinery while taking prescription pain medicine. Activity  Rest for the rest of the day.  Return to your normal activities as told by your health care provider. Ask your health care provider what activities are safe for you. Incision care   Follow instructions from your health care provider about how to take care of your incision. Make sure you: ? Wash your hands with soap and water before you change your bandage (dressing). If soap and water are not available, use hand sanitizer. ? Change your dressing as told by your health care provider. ? Leave stitches (sutures) in place. They may need to stay in place for 2 weeks or longer.  Check your incision area every day for signs of infection. Check for: ? More redness, swelling, or pain. ? More fluid or blood. ? Warmth. ? Pus or a bad smell. Other Instructions  Do not take baths, swim, or use a hot tub until your health care provider approves. You may take  showers.  Keep all follow-up visits as told by your health care provider. This is important.  Have someone help you with your daily household tasks for the first few days. Contact a health care provider if:  You have more redness, swelling, or pain around your incision.  Your incision feels warm to the touch.  You have pus or a bad smell coming from your incision.  The edges of your incision break open after the sutures have been removed.  Your pain does not improve after 2-3 days.  You have a rash.  You repeatedly become dizzy or light-headed.  Your pain medicine is not helping.  You are constipated. Get help right away if:  You have a fever.  You faint.  You have increasing pain in your abdomen.  You have severe pain in one or both of your shoulders.  You have fluid or blood coming from your sutures or from your vagina.  You have shortness of breath or difficulty breathing.  You have chest pain or leg pain.  You have ongoing nausea, vomiting, or diarrhea. This information is not intended to replace advice given to you by your health care provider. Make sure you discuss any questions you have with your health care provider. Document Released: 10/30/2004 Document Revised: 09/15/2015 Document Reviewed: 03/23/2015 Elsevier Interactive Patient Education  2018 [place]. Endometrial Ablation Endometrial ablation is a procedure that destroys the thin inner layer of the lining of the uterus (endometrium). This procedure may be done:  To stop heavy periods.  To stop bleeding that  is causing anemia.  To control irregular bleeding.  To treat bleeding caused by small tumors (fibroids) in the endometrium.  This procedure is often an alternative to major surgery, such as removal of the uterus and cervix (hysterectomy). As a result of this procedure:  You may not be able to have children. However, if you are premenopausal (you have not gone through menopause): ? You  may still have a small chance of getting pregnant. ? You will need to use a reliable method of birth control after the procedure to prevent pregnancy.  You may stop having a menstrual period, or you may have only a small amount of bleeding during your period. Menstruation may return several years after the procedure.  Tell a health care provider about:  Any allergies you have.  All medicines you are taking, including vitamins, herbs, eye drops, creams, and over-the-counter medicines.  Any problems you or family members have had with the use of anesthetic medicines.  Any blood disorders you have.  Any surgeries you have had.  Any medical conditions you have. What are the risks? Generally, this is a safe procedure. However, problems may occur, including:  A hole (perforation) in the uterus or bowel.  Infection of the uterus, bladder, or vagina.  Bleeding.  Damage to other structures or organs.  An air bubble in the lung (air embolus).  Problems with pregnancy after the procedure.  Failure of the procedure.  Decreased ability to diagnose cancer in the endometrium.  What happens before the procedure?  You will have tests of your endometrium to make sure there are no pre-cancerous cells or cancer cells present.  You may have an ultrasound of the uterus.  You may be given medicines to thin the endometrium.  Ask your health care provider about: ? Changing or stopping your regular medicines. This is especially important if you take diabetes medicines or blood thinners. ? Taking medicines such as aspirin and ibuprofen. These medicines can thin your blood. Do not take these medicines before your procedure if your doctor tells you not to.  Plan to have someone take you home from the hospital or clinic. What happens during the procedure?  You will lie on an exam table with your feet and legs supported as in a pelvic exam.  To lower your risk of infection: ? Your health care  team will wash or sanitize their hands and put on germ-free (sterile) gloves. ? Your genital area will be washed with soap.  An IV tube will be inserted into one of your veins.  You will be given a medicine to help you relax (sedative).  A surgical instrument with a light and camera (resectoscope) will be inserted into your vagina and moved into your uterus. This allows your surgeon to see inside your uterus.  Endometrial tissue will be removed using one of the following methods: ? Radiofrequency. This method uses a radiofrequency-alternating electric current to remove the endometrium. ? Cryotherapy. This method uses extreme cold to freeze the endometrium. ? Heated-free liquid. This method uses a heated saltwater (saline) solution to remove the endometrium. ? Microwave. This method uses high-energy microwaves to heat up the endometrium and remove it. ? Thermal balloon. This method involves inserting a catheter with a balloon tip into the uterus. The balloon tip is filled with heated fluid to remove the endometrium. The procedure may vary among health care providers and hospitals. What happens after the procedure?  Your blood pressure, heart rate, breathing rate, and blood oxygen  level will be monitored until the medicines you were given have worn off.  As tissue healing occurs, you may notice vaginal bleeding for 4-6 weeks after the procedure. You may also experience: ? Cramps. ? Thin, watery vaginal discharge that is light pink or brown in color. ? A need to urinate more frequently than usual. ? Nausea.  Do not drive for 24 hours if you were given a sedative.  Do not have sex or insert anything into your vagina until your health care provider approves. Summary  Endometrial ablation is done to treat the many causes of heavy menstrual bleeding.  The procedure may be done only after medications have been tried to control the bleeding.  Plan to have someone take you home from the  hospital or clinic. This information is not intended to replace advice given to you by your health care provider. Make sure you discuss any questions you have with your health care provider. Document Released: 02/20/2004 Document Revised: 04/29/2016 Document Reviewed: 04/29/2016 Elsevier Interactive Patient Education  2017 [employer] [facility] Instructions  Activity: Get plenty of rest for the remainder of the day. A responsible individual must stay with you for 24 hours following the procedure.  For the next 24 hours, DO NOT: -Drive a car -Paediatric nurse -Drink alcoholic beverages -Take any medication unless instructed by your physician -Make any legal decisions or sign important papers.  Meals: Start with liquid foods such as gelatin or soup. Progress to regular foods as tolerated. Avoid greasy, spicy, heavy foods. If nausea and/or vomiting occur, drink only clear liquids until the nausea and/or vomiting subsides. Call your physician if vomiting continues.  Special Instructions/Symptoms: Your throat may feel dry or sore from the anesthesia or the breathing tube placed in your throat during surgery. If this causes discomfort, gargle with warm salt water. The discomfort should disappear within 24 hours.  If you had a scopolamine patch placed behind your ear for the management of post- operative nausea and/or vomiting:  1. The medication in the patch is effective for 72 hours, after which it should be removed.  Wrap patch in a tissue and discard in the trash. Wash hands thoroughly with soap and water. 2. You may remove the patch earlier than 72 hours if you experience unpleasant side effects which may include dry mouth, dizziness or visual disturbances. 3. Avoid touching the patch. Wash your hands with soap and water after contact with the patch.

## 2016-10-11 NOTE — Op Note (Signed)
Preoperative diagnosis: Menorrhagia, Multiparity and Undesired fertility  Postoperative diagnosis: Same  Procedure: Laparoscopic BTL, Dilation and Curettage with HTA ablation  Surgeon: Darron Doom, MD  Anesthesia: Daphene Calamity, MD  Findings: Umbilical hernia above vertical midline incision, enlarged uterus, normal tubes and ovaries  Estimated blood loss: Minimal  Complications: None known  Specimens: Endometrial curettings  Reason for procedure: 46 y.o. [id number]  who has h/o multiple C-sections, heavy menstrual bleeding who has failed multiple other treatment modalities and who desires more definitive treatment. Has h/o anemia and that has improved with Megace. Patient has gained 15 pounds due to this medicine. Patient has maintained fertility and needs sterility. Patient counseled, r.e. Risks benefits of BTL, including permanency of procedure, risk of failure(1:100), increased risk of ectopic.  Patient verbalized understanding and desires to proceed.  Procedure: Patient was taken to the operating room was placed in dorsal lithotomy in [name]. She was prepped and draped in the usual sterile fashion. A timeout was performed. SCDs were in place. Red rubber catheter is used to drain bladder. Speculum was placed inside the vagina. The cervix was visualized and grasped anteriorly with an acorn tenaculum. A Hulka tenaculum was placed through the cervix for uterine manipulation. The single tooth tenaculum and speculum were removed from the vagina.  Attention was then turned to the abdomen. Four cc of 0.25% Marcaine was injected at the umbilicus. Two Allis clamps were used to tent up the skin of the umbilicus a vertical one half centimeter incision was made here. The fascia was incised with the knife  And the peritoneum was entered sharply with this incision. A purse string suture of 0-Vicryl on a UR-6 was placed in the fascia. A 10 mm Hassan trocar was placed through this incision and  a pneumoperitoneum was created. The patient was then placed in Trendelenburg. The uterus was identified.  The right and left tubes were identified and followed to their fimbriated ends.  Filshie clips placed across these at 1.5 cm from cornu bilaterally.  A second one placed on the right, due to inability to elevate the uterus and ensure proper placement. Pneumoperitoneum let down and instruments removed from abdomen. Previous mentioned purse string suture tied down.  Skin closed with 3-0 Vicryl on an X-1 in a subcuticular fashion.  Sterile dressing applied.  Attention turned to the vagina. The acorn and single tooth removed. A speculum was placed inside the vagina and the cervix visualized. The cervix was grasped anteriorly with a single-tooth tenaculum. 20 cc of quarter percent Marcaine were injected for paracervical block. The uterus sounded to 7 cm. Sequential dilation was done to a #23 dilator, and the HTA with hysteroscope was introduced into the uterine cavity. The cervix and endometrial lining appeared normal both ostia were seen there was some deformity of the cavity. A seal test was done and was passed. The uterine cavity was heated to between 80 and 90C and HTA was performed for 10 minutes. Cooling was then performed and the instrument removed. Sharp curettage was then performed and sample sent to pathology. All instrument, needle, and lap counts were correct x 2. The patient was awakened taken to recovery room in stable condition.   Donnamae Jude 10/11/2016 2:31 PM

## 2016-10-11 NOTE — Anesthesia Postprocedure Evaluation (Signed)
Anesthesia Post Note  Patient: Teresa [name]  Procedure(s) Performed: Procedure(s) (LRB): HYSTEROSCOPY WITH HYDROTHERMAL ABLATION (N/A) LAPAROSCOPIC TUBAL LIGATION WITH FILSHIE CLIPS (Bilateral)     Patient location during evaluation: PACU Anesthesia Type: General Level of consciousness: awake Pain management: pain level controlled Vital Signs Assessment: post-procedure vital signs reviewed and stable Respiratory status: spontaneous breathing Cardiovascular status: stable Postop Assessment: adequate PO intake Anesthetic complications: no    Last Vitals:  Vitals:   10/11/16 1600 10/11/16 1615  BP: (!) 198/109 (!) 180/113  Pulse: 64 63  Resp: 14 16  Temp:  36.6 C    Last Pain:  Vitals:   10/11/16 1615  PainSc: 5    Pain Goal: Patients Stated Pain Goal: 4 (10/11/16 1615)               [place]

## 2016-10-11 NOTE — Anesthesia Preprocedure Evaluation (Signed)
Anesthesia Evaluation  Patient identified by MRN, date of birth, ID band Patient awake    Reviewed: Allergy & Precautions, H&P , NPO status , Patient's Chart, lab work & pertinent test results, reviewed documented beta blocker date and time   Airway Mallampati: I  TM Distance: >3 FB Neck ROM: full    Dental no notable dental hx. (+) Teeth Intact   Pulmonary Current Smoker,  Used inhaler this am   breath sounds clear to auscultation       Cardiovascular hypertension, Pt. on medications Normal cardiovascular exam Rhythm:regular Rate:Normal     Neuro/Psych PSYCHIATRIC DISORDERS Anxiety Depression Bipolar Disorder negative psych ROS   GI/Hepatic GERD  Medicated and Controlled,  Endo/Other  negative endocrine ROS  Renal/GU negative Renal ROS     Musculoskeletal   Abdominal Normal abdominal exam  (+)   Peds  Hematology  (+) HIV,   Anesthesia Other Findings   Reproductive/Obstetrics negative OB ROS                             Anesthesia Physical Anesthesia Plan  ASA: III  Anesthesia Plan: General   Post-op Pain Management:    Induction: Intravenous  PONV Risk Score and Plan: 4 or greater and Ondansetron, Dexamethasone, Propofol, Midazolam and Scopolamine patch - Pre-op  Airway Management Planned: Oral ETT  Additional Equipment:   Intra-op Plan:   Post-operative Plan: Extubation in OR  Informed Consent: I have reviewed the patients History and Physical, chart, labs and discussed the procedure including the risks, benefits and alternatives for the proposed anesthesia with the patient or authorized representative who has indicated his/her understanding and acceptance.   Dental Advisory Given  Plan Discussed with: CRNA and Surgeon  Anesthesia Plan Comments:         Anesthesia Quick Evaluation

## 2016-10-11 NOTE — Anesthesia Procedure Notes (Signed)
Procedure Name: Intubation Date/Time: 10/11/2016 1:26 PM Performed by: Jonna Munro Pre-anesthesia Checklist: Patient identified, Emergency Drugs available, Suction available, Patient being monitored and Timeout performed Patient Re-evaluated:Patient Re-evaluated prior to inductionOxygen Delivery Method: Circle system utilized Preoxygenation: Pre-oxygenation with 100% oxygen Intubation Type: IV induction Ventilation: Mask ventilation without difficulty Laryngoscope Size: Mac and 3 Grade View: Grade I Tube type: Oral Number of attempts: 1 Airway Equipment and Method: Stylet Placement Confirmation: ETT inserted through vocal cords under direct vision,  positive ETCO2 and breath sounds checked- equal and bilateral Secured at: 22 cm Tube secured with: Tape Dental Injury: Teeth and Oropharynx as per pre-operative assessment

## 2016-10-12 ENCOUNTER — Encounter (HOSPITAL_COMMUNITY): Payer: Self-pay | Admitting: Family Medicine

## 2016-10-12 ENCOUNTER — Telehealth: Payer: Self-pay

## 2016-10-12 NOTE — Telephone Encounter (Signed)
Pt called and states that she had a procedure yesterday. The discharge instructions stated that she would have a clear discharge but she has noticed a bloody discharge. Would like a call back to know if she needs to be seen.

## 2016-10-13 NOTE — Telephone Encounter (Signed)
Called patient regarding her bleeding on yesterday. Patient stated she is doing better today and the bleeding has stop at this time.

## 2016-10-15 ENCOUNTER — Telehealth: Payer: Self-pay | Admitting: *Deleted

## 2016-10-15 NOTE — Telephone Encounter (Signed)
Patient left message, had procedure on the 18th, ran out her med, can we call in a refill or recommend something OTC for her? Please return call.

## 2016-10-19 NOTE — Telephone Encounter (Signed)
I have spoken with patient who stated she is feeling dizzy and her blood pressure has been running high. [facility] and it was elevated. After reviewing her chart I noticed her medications were increase however this is not working for patient. I advised patient to follow up with her pcp or [facility] regarding her symptoms. Patient voice understanding and plans to seek medical attention today.

## 2016-10-20 ENCOUNTER — Other Ambulatory Visit (HOSPITAL_COMMUNITY): Payer: Self-pay | Admitting: Family Medicine

## 2016-10-21 ENCOUNTER — Telehealth: Payer: Self-pay | Admitting: General Practice

## 2016-10-21 ENCOUNTER — Encounter: Payer: Self-pay | Admitting: Family Medicine

## 2016-10-21 MED ORDER — PANTOPRAZOLE SODIUM 40 MG PO TBEC: 40.0000 mg | DELAYED_RELEASE_TABLET | Freq: Two times a day (BID) | ORAL | 2 refills | Status: DC

## 2016-10-21 NOTE — Telephone Encounter (Signed)
Called patient regarding mychart message. Patient states she is still having LLQ pain. Asked patient if she was still having gas/constipation. Patient states she is. Patient states she has taken some miralax which has helped. Recommended she continue miralax & may also try gas-x or simethicone. Patient verbalized understanding & states her protonix was sent to the wrong pharamacy. Told patient I will correct that and send it to the correct pharmacy. Patient verbalized understanding. Asked patient about her incision. Patient states it has been a little red lately but she has been messing with it & putting peroxide on it to clean it. Told patient she doesn't need to do that, just keep it clean and dry. Patient verbalized understanding & had no questions

## 2016-10-22 ENCOUNTER — Encounter: Payer: Self-pay | Admitting: Family Medicine

## 2016-10-23 ENCOUNTER — Encounter: Payer: Self-pay | Admitting: Family Medicine

## 2016-11-04 ENCOUNTER — Ambulatory Visit: Payer: Medicaid Other | Admitting: Family Medicine

## 2016-11-22 ENCOUNTER — Ambulatory Visit (INDEPENDENT_AMBULATORY_CARE_PROVIDER_SITE_OTHER): Payer: Medicaid Other | Admitting: Family Medicine

## 2016-11-22 ENCOUNTER — Encounter: Payer: Self-pay | Admitting: Family Medicine

## 2016-11-22 ENCOUNTER — Ambulatory Visit (INDEPENDENT_AMBULATORY_CARE_PROVIDER_SITE_OTHER): Payer: Medicaid Other | Admitting: Clinical

## 2016-11-22 VITALS — BP 152/98 | HR 76 | Wt 164.5 lb

## 2016-11-22 DIAGNOSIS — F3112 Bipolar disorder, current episode manic without psychotic features, moderate: Secondary | ICD-10-CM

## 2016-11-22 DIAGNOSIS — N938 Other specified abnormal uterine and vaginal bleeding: Secondary | ICD-10-CM

## 2016-11-22 NOTE — BH Specialist Note (Signed)
Integrated Behavioral Health Initial Visit  MRN: [id number] Name: Teresa [name]   Session Start time: 12:05 Session End time: 12:30 Total time: 30 minutes  Type of Service: [place] Interpretor:No. Interpretor Name and Language: n/a   Warm Hand Off Completed.       SUBJECTIVE: SHAWNIKA [name] is a 46 y.o. female accompanied by patient. Patient was referred by Dr Kennon Rounds for anxiety related to . Patient reports the following symptoms/concerns: Pt states that her primary concern today is feeling anxious and irritable, restless,not getting quality sleep(waking up after two hours) after profuse sweating. Pt says she has coped in the past with a therapist; open to establishing psychiatry care locally, and learning coping strategy to use today, for current escalating anxiety. No SI, no HI.  Duration of problem: Years, most current concern in past month; Severity of problem: moderate  OBJECTIVE: Mood: Anxious and Irritable and Affect: Appropriate Risk of harm to self or others: No plan to harm self or others   LIFE CONTEXT: Family and Social: Lives with 86yo daughter; also has 35yo son. Supportive family School/Work: Currently unemployed Self-Care: Recognizes need for greater self-care; quit smoking and drinking alcohol for improved health, prior to surgery Life Changes: New insurance, unemployed, surgery  GOALS ADDRESSED: Patient will reduce symptoms of: anxiety, depression, mood instability and stress and increase knowledge and/or ability of: self-management skills and also: Increase healthy adjustment to current life circumstances   INTERVENTIONS: Mindfulness or Relaxation Training and Psychoeducation and/or Health Education  Standardized Assessments completed: GAD-7 and PHQ 9  ASSESSMENT: Patient currently experiencing Bipolar affective disorder, current episode manic, moderate. Patient may benefit from psychoeducation and brief therapeutic  interventions regarding coping with symptoms of anxiety and depression related to bipolar affective disorder, along with referral to psychiatry for [facility] med management.  PLAN: 1. Follow up with behavioral health clinician on : One week phone f/u by [facility] 2. Behavioral recommendations:  -Establish care with psychiatry, for [facility] med management and ongoing therapy([facility] [place], [place] clinics, or  [facility] [place] or [place] walk-in clinics) 3. Referral(s): [place] (In Clinic), Psychiatrist and Counselor 4. "From scale of 1-10, how likely are you to follow plan?": 8  Teresa [name], LCSWA    Depression screen [facility] 2/9 11/22/2016 07/21/2016  Decreased Interest 1 0  Down, Depressed, Hopeless 1 1  PHQ - 2 Score 2 1  Altered sleeping 2 2  Tired, decreased energy 2 2  Change in appetite 1 0  Feeling bad or failure about yourself  0 0  Trouble concentrating 1 0  Moving slowly or fidgety/restless 0 2  Suicidal thoughts 0 0  PHQ-9 Score 8 7   GAD 7 : Generalized Anxiety Score 11/22/2016 07/21/2016  Nervous, Anxious, on Edge 2 2  Control/stop worrying 2 1  Worry too much - different things 1 0  Trouble relaxing 2 2  Restless 1 0  Easily annoyed or irritable 2 2  Afraid - awful might happen 0 0  Total GAD 7 Score 10 7

## 2016-11-22 NOTE — Progress Notes (Signed)
Obstetrics and Gynecology Visit Return Patient Evaluation  Appointment Date: 11/22/2016    History of Present Illness:  Teresa [name] is a 46 y.o. presenting for s/p laproscopic BTL with HTA endometrial ablation on 10/11/16 for abnormal uterine bleeding. After her surgery, she was having bleeding that required use of multiple pads per day. 3 weeks ago, the bleeding stopped and she began having red/brown discharge with odor. The discharge resolved one week ago and since then, she has had no bleeding or discharge. No abdominal pain, or concerns about her incision. No fever or chills.   Patient has been feeling very anxious and depressed recently. Admits to profuse sweating, requiring her to change clothes multiple times a day. She wakes up multiple times a night because she is sweating. These symptoms were present prior to surgery but have gotten worsen. Also feels down. She cries in her room alone multiple times a day. She thought surgery would help improve her quality of life but she still feels upset. No SI/HI. She has a history of depression, anxiety, and bipolar. She sees Dr. Ola Spurr, her PCP for management and is on Buspar. She doesn't think the medication is helping her.    Review of Systems: Review of Systems  Constitutional: Positive for diaphoresis and malaise/fatigue. Negative for chills, fever and weight loss.       Increased appetite  Respiratory: Negative.   Cardiovascular: Negative.   Gastrointestinal: Negative.   Genitourinary: Negative.   Musculoskeletal: Negative.   Neurological: Negative.  Negative for weakness.  Psychiatric/Behavioral: Positive for depression. Negative for hallucinations, memory loss, substance abuse and suicidal ideas. The patient is nervous/anxious and has insomnia.     The following portions of the patient's history were reviewed and updated as appropriate: allergies, current medications, past family history, past medical history, past social history,  past surgical history and problem list.   Patient Active Problem List   Diagnosis Date Noted  . Iron deficiency anemia due to chronic blood loss 03/25/2014  . Abnormal uterine bleeding (AUB) 03/12/2014  . Bipolar affective disorder ([place]) 04/13/2012  . Endometriosis 04/13/2012  . Migraine headache 04/13/2012  . Hepatitis C infection 03/10/2011  . HIV disease ([place]) 03/10/2011   Medications:  Current Outpatient Prescriptions on File Prior to Visit  Medication Sig Dispense Refill  . abacavir-dolutegravir-lamiVUDine (TRIUMEQ) 600-50-300 MG tablet Take 1 tablet by mouth daily. 30 tablet   . albuterol (PROAIR HFA) 108 (90 Base) MCG/ACT inhaler Inhale 2 puffs into the lungs every 6 (six) hours as needed for wheezing or shortness of breath.    . busPIRone (BUSPAR) 5 MG tablet Take 5 mg by mouth 2 (two) times daily.    . fluticasone (FLONASE) 50 MCG/ACT nasal spray Place 1 spray into both nostrils daily as needed for allergies or rhinitis.    . hydrochlorothiazide (HYDRODIURIL) 25 MG tablet Take 2 tablets (50 mg total) by mouth daily. 30 tablet 3  . meloxicam (MOBIC) 7.5 MG tablet Take 7.5 mg by mouth 2 (two) times daily.    . pantoprazole (PROTONIX) 40 MG tablet Take 1 tablet (40 mg total) by mouth 2 (two) times daily. 60 tablet 2  . Vitamin D, Ergocalciferol, (DRISDOL) 50000 units CAPS capsule Take 50,000 Units by mouth every 7 (seven) days.    . cetirizine (ZYRTEC) 10 MG tablet Take 10 mg by mouth daily as needed for allergies.     No current facility-administered medications on file prior to visit.    Allergies: is allergic to naproxen sodium.  Physical Exam:  BP (!) 152/98   Pulse 76   Wt 164 lb 8 oz (74.6 kg)   LMP 10/13/2016 (Approximate)   BMI 26.55 kg/m  Body mass index is 26.55 kg/m. General appearance: Well nourished, well developed female in no acute distress.  Abdomen: diffusely non tender to palpation, non distended, and no masses, hernias. Abdominal incision without  erythema or discharge. Neuro/Psych:  Anxious and depressed mood. No SI/HI.      Assessment and Plan:  1. Abnormal uterine bleeding S/p Laproscopic BTL with HTA endometrial ablation Improved bleeding, incision healing well. -Counseling provided, patient aware that bleeding may recur. -Return precautions given  2. Anxiety and Depression History of bipolar disorder listed on problem list but no manic episodes recalled. Not being treated by provider. Taking Buspar without improvement. - Ambulatory referral to [facility], Medical Student

## 2016-11-22 NOTE — Patient Instructions (Signed)

## 2016-11-29 ENCOUNTER — Telehealth: Payer: Self-pay | Admitting: Clinical

## 2016-11-29 NOTE — Telephone Encounter (Signed)
Follow-up with patient;Pt is currently waiting for [facility] to send her initial paperwork, so she can begin psychiatric services with their agency. Pt is aware that if she needs any additional information from [facility],or needs to be seen by [facility] prior to her first appointment with [facility], she may call [facility] [place] [facility] at [phone].

## 2016-12-03 ENCOUNTER — Emergency Department (HOSPITAL_COMMUNITY): Payer: Medicaid Other

## 2016-12-03 ENCOUNTER — Encounter (HOSPITAL_COMMUNITY): Payer: Self-pay | Admitting: *Deleted

## 2016-12-03 ENCOUNTER — Emergency Department (HOSPITAL_COMMUNITY)
Admission: EM | Admit: 2016-12-03 | Discharge: 2016-12-03 | Disposition: A | Discharge status: Home / Self Care | Payer: Medicaid Other | Attending: Emergency Medicine | Admitting: Emergency Medicine

## 2016-12-03 DIAGNOSIS — J45909 Unspecified asthma, uncomplicated: Secondary | ICD-10-CM | POA: Diagnosis not present

## 2016-12-03 DIAGNOSIS — Z79899 Other long term (current) drug therapy: Secondary | ICD-10-CM | POA: Insufficient documentation

## 2016-12-03 DIAGNOSIS — N939 Abnormal uterine and vaginal bleeding, unspecified: Secondary | ICD-10-CM

## 2016-12-03 DIAGNOSIS — N938 Other specified abnormal uterine and vaginal bleeding: Secondary | ICD-10-CM | POA: Diagnosis not present

## 2016-12-03 DIAGNOSIS — B20 Human immunodeficiency virus [HIV] disease: Secondary | ICD-10-CM | POA: Diagnosis not present

## 2016-12-03 DIAGNOSIS — F17210 Nicotine dependence, cigarettes, uncomplicated: Secondary | ICD-10-CM | POA: Insufficient documentation

## 2016-12-03 DIAGNOSIS — R1031 Right lower quadrant pain: Secondary | ICD-10-CM

## 2016-12-03 LAB — URINALYSIS, ROUTINE W REFLEX MICROSCOPIC
Bacteria, UA: NONE SEEN
Bilirubin Urine: NEGATIVE
Glucose, UA: NEGATIVE mg/dL
Ketones, ur: NEGATIVE mg/dL
Leukocytes, UA: NEGATIVE
Nitrite: NEGATIVE
Protein, ur: 30 mg/dL — AB
Specific Gravity, Urine: 1.015 (ref 1.005–1.030)
pH: 8 (ref 5.0–8.0)

## 2016-12-03 LAB — COMPREHENSIVE METABOLIC PANEL
ALT: 73 U/L — ABNORMAL HIGH (ref 14–54)
AST: 75 U/L — ABNORMAL HIGH (ref 15–41)
Albumin: 3.7 g/dL (ref 3.5–5.0)
Alkaline Phosphatase: 115 U/L (ref 38–126)
Anion gap: 9 (ref 5–15)
BUN: 8 mg/dL (ref 6–20)
CO2: 22 mmol/L (ref 22–32)
Calcium: 9.2 mg/dL (ref 8.9–10.3)
Chloride: 105 mmol/L (ref 101–111)
Creatinine, Ser: 0.77 mg/dL (ref 0.44–1.00)
GFR calc Af Amer: >60 mL/min (ref >60)
GFR calc non Af Amer: >60 mL/min (ref >60)
Glucose, Bld: 124 mg/dL — ABNORMAL HIGH (ref 65–99)
Potassium: 3.3 mmol/L — ABNORMAL LOW (ref 3.5–5.1)
Sodium: 136 mmol/L (ref 135–145)
Total Bilirubin: 0.7 mg/dL (ref 0.3–1.2)
Total Protein: 7.7 g/dL (ref 6.5–8.1)

## 2016-12-03 LAB — CBC
HCT: 40.9 % (ref 36.0–46.0)
Hemoglobin: 13.6 g/dL (ref 12.0–15.0)
MCH: 27 pg (ref 26.0–34.0)
MCHC: 33.3 g/dL (ref 30.0–36.0)
MCV: 81.3 fL (ref 78.0–100.0)
Platelets: 191 10*3/uL (ref 150–400)
RBC: 5.03 MIL/uL (ref 3.87–5.11)
RDW: 15.2 % (ref 11.5–15.5)
WBC: 3.1 10*3/uL — ABNORMAL LOW (ref 4.0–10.5)

## 2016-12-03 LAB — WET PREP, GENITAL
Sperm: NONE SEEN
Trich, Wet Prep: NONE SEEN
Yeast Wet Prep HPF POC: NONE SEEN

## 2016-12-03 LAB — I-STAT BETA HCG BLOOD, ED (MC, WL, AP ONLY): I-stat hCG, quantitative: <5 m[IU]/mL (ref <5)

## 2016-12-03 LAB — LIPASE, BLOOD: Lipase: 29 U/L (ref 11–51)

## 2016-12-03 MED ORDER — IOPAMIDOL (ISOVUE-300) INJECTION 61%
INTRAVENOUS | Status: AC
  Administered 2016-12-03: 100 mL
  Filled 2016-12-03: qty 100

## 2016-12-03 MED ORDER — ONDANSETRON 4 MG PO TBDP
4.0000 mg | ORAL_TABLET | Freq: Once | ORAL | Status: AC | PRN
  Administered 2016-12-03: 4 mg via ORAL

## 2016-12-03 MED ORDER — OXYCODONE-ACETAMINOPHEN 5-325 MG PO TABS
1.0000 | ORAL_TABLET | Freq: Once | ORAL | Status: AC
  Administered 2016-12-03: 1 via ORAL
  Filled 2016-12-03: qty 1

## 2016-12-03 MED ORDER — ONDANSETRON HCL 4 MG PO TABS: 4.0000 mg | ORAL_TABLET | Freq: Three times a day (TID) | ORAL | 0 refills | Status: DC | PRN

## 2016-12-03 MED ORDER — ONDANSETRON 4 MG PO TBDP
ORAL_TABLET | ORAL | Status: AC
  Filled 2016-12-03: qty 1

## 2016-12-03 MED ORDER — NORGESTIM-ETH ESTRAD TRIPHASIC 0.18/0.215/0.25 MG-25 MCG PO TABS: 3.0000 | ORAL_TABLET | Freq: Every day | ORAL | 11 refills | Status: DC

## 2016-12-03 NOTE — ED Notes (Signed)
Patient transported to CT 

## 2016-12-03 NOTE — Discharge Instructions (Signed)
You have received a prescription for a medication called Sprintec. This is an oral contraceptive. Take 3 pills a day for one week then throw the rest away. This should help with your uterine bleeding. It should cease during this time. After this week take one week off and during this time and will likely have a regular period. This may cause a little bit of nausea when you're taking 3 a day if so you may back down to twice a day. Apply ice or heat to the affected area for comfort. Alternate 600 mg of ibuprofen and [phone] mg of Tylenol every 3 hours as needed for pain. Do not exceed 4000 mg of Tylenol daily. Please follow-up with your gynecologist in 1-2 weeks for further evaluation and management of your vaginal bleeding. Return to the ED immediately if any concerning signs or symptoms develop such as fevers, passing out, or blood in your urine or stool.

## 2016-12-03 NOTE — ED Notes (Addendum)
Pt reports having lower right abd pains for the past month or so. Pt reports having a cervical ablation done a month ago. Pt reports she started her cycle again last night and the pains came back on similar to before her ablation. Pt reports the pain is middle and then comes on with a vengeance.

## 2016-12-03 NOTE — ED Triage Notes (Signed)
Pt reports onset yesterday of RLQ pain with n/v. Denies urinary symptoms but did have recent irregular vaginal bleeding/discharge. Denies diarrhea.

## 2016-12-03 NOTE — ED Provider Notes (Signed)
[facility] DEPT Provider Note   CSN: [id number] Arrival date & time: 12/03/16  1311     History   Chief Complaint Chief Complaint  Patient presents with  . Abdominal Pain  . Emesis    HPI Teresa [name] is a 46 y.o. female with history of Anemia, arthritis, bipolar disorder, GERD, endometriosis and uterine fibroids, hepatitis, HIV infection who presents today with complaint of acute onset, waxing and waning abdominal pain as well as vaginal bleeding. She states that she had a uterine ablation 1.5 months ago and since then has had intermittent spotting. Yesterday she developed heavy menstrual bleeding similar to prior to the ablation as well as right lower quadrant abdominal pain. Pain is constant and throbbing with occasional sharp stabbing pains. Pain improved somewhat on palpation of the right lower quadrant and rotation to the right side.   The history is provided by the patient.    Past Medical History:  Diagnosis Date  . Anemia    iron infusions x 2  . Anxiety   . Arthritis    Neck, left shoulder  . Asthma    uses inhaler 2-3 x week  . Bipolar disorder ([place])   . Depression   . Endometriosis   . Fibroids   . GERD (gastroesophageal reflux disease)   . Headache    Migraines  . Hepatitis 2009   Hep C  . HIV infection ([place])   . Pancreatitis   . Seasonal allergies   . SVD (spontaneous vaginal delivery)    x 1    Patient Active Problem List   Diagnosis Date Noted  . Iron deficiency anemia due to chronic blood loss 03/25/2014  . Abnormal uterine bleeding (AUB) 03/12/2014  . Bipolar affective disorder ([place]) 04/13/2012  . Endometriosis 04/13/2012  . Migraine headache 04/13/2012  . Hepatitis C infection 03/10/2011  . HIV disease ([place]) 03/10/2011    Past Surgical History:  Procedure Laterality Date  . CESAREAN SECTION     x2  . DILATION AND CURETTAGE OF UTERUS     x 2 - TAB  . HAND SURGERY Right    2 pins in wrist  . HYSTEROSCOPY N/A 10/11/2016   Procedure: HYSTEROSCOPY WITH HYDROTHERMAL ABLATION;  Surgeon: Donnamae Jude, MD;  Location: [facility];  Service: Gynecology;  Laterality: N/A;  Caryl Pina the HTA rep will be here.  Confirmed on 09/02/16.   Marland Kitchen LAPAROSCOPIC TUBAL LIGATION Bilateral 10/11/2016   Procedure: LAPAROSCOPIC TUBAL LIGATION WITH FILSHIE CLIPS;  Surgeon: Donnamae Jude, MD;  Location: [facility];  Service: Gynecology;  Laterality: Bilateral;  . MOUTH SURGERY    . WISDOM TOOTH EXTRACTION      OB History    Gravida Para Term Preterm AB Living   5 3 3   2      SAB TAB Ectopic Multiple Live Births           3       Home Medications    Prior to Admission medications   Medication Sig Start Date End Date Taking? Authorizing Provider  abacavir-dolutegravir-lamiVUDine (TRIUMEQ) 600-50-300 MG tablet Take 1 tablet by mouth daily. 07/21/16   Donnamae Jude, MD  albuterol [facility] HFA) 108 (90 Base) MCG/ACT inhaler Inhale 2 puffs into the lungs every 6 (six) hours as needed for wheezing or shortness of breath.    [provider]  busPIRone (BUSPAR) 5 MG tablet Take 5 mg by mouth 2 (two) times daily.    [provider]  cetirizine (ZYRTEC) 10  MG tablet Take 10 mg by mouth daily as needed for allergies.    [provider]  fluticasone (FLONASE) 50 MCG/ACT nasal spray Place 1 spray into both nostrils daily as needed for allergies or rhinitis.    [provider]  hydrochlorothiazide (HYDRODIURIL) 25 MG tablet Take 2 tablets (50 mg total) by mouth daily. 10/08/16   Emily Filbert, MD  meloxicam (MOBIC) 7.5 MG tablet Take 7.5 mg by mouth 2 (two) times daily.    [provider]  Norgestimate-Ethinyl Estradiol Triphasic 0.18/0.215/0.25 MG-25 MCG tab Take 3 tablets by mouth daily. 12/03/16 12/10/16  Rodell Perna A, PA-C  ondansetron (ZOFRAN) 4 MG tablet Take 1 tablet (4 mg total) by mouth every 8 (eight) hours as needed for nausea or vomiting. 12/03/16   Nils Flack, Deshante Cassell A, PA-C  pantoprazole (PROTONIX) 40 MG tablet  Take 1 tablet (40 mg total) by mouth 2 (two) times daily. 10/21/16   Donnamae Jude, MD  Vitamin D, Ergocalciferol, (DRISDOL) 50000 units CAPS capsule Take 50,000 Units by mouth every 7 (seven) days.    [provider]    Family History Family History  Problem Relation Age of Onset  . Breast cancer Paternal Grandmother   . Breast cancer Cousin     Social History Social History  Substance Use Topics  . Smoking status: Current Every Day Smoker    Packs/day: 0.25    Years: 25.00    Types: Cigarettes  . Smokeless tobacco: Never Used     Comment: uses vapor occasionally  . Alcohol use Yes     Comment: wine occ     Allergies   Naproxen sodium   Review of Systems Review of Systems  Constitutional: Negative for chills and fever.  Respiratory: Negative for shortness of breath.   Cardiovascular: Negative for chest pain.  Gastrointestinal: Positive for abdominal pain, nausea and vomiting. Negative for blood in stool, constipation and diarrhea.  Genitourinary: Positive for vaginal bleeding. Negative for dysuria, frequency, hematuria, vaginal discharge and vaginal pain.  Musculoskeletal: Negative for back pain.  All other systems reviewed and are negative.    Physical Exam Updated Vital Signs BP (!) 142/70 (BP Location: Left Arm)   Pulse 84   Temp 97.9 F (36.6 C) (Oral)   Resp 14   SpO2 100%   Physical Exam  Constitutional: She appears well-developed and well-nourished. No distress.  HENT:  Head: Normocephalic and atraumatic.  Eyes: Conjunctivae are normal. Right eye exhibits no discharge. Left eye exhibits no discharge.  Neck: Normal range of motion. Neck supple. No JVD present. No tracheal deviation present.  Cardiovascular: Normal rate and regular rhythm.   Pulmonary/Chest: Effort normal and breath sounds normal.  Abdominal: Soft. Bowel sounds are normal. She exhibits no distension and no mass. There is tenderness. There is no rebound and no guarding.  RLQ  ttp. Murphy sign absent, Rovsing sign absent, no tenderness to palpation at McBurney's point, psoas sign absent, no CVA tenderness  Genitourinary:  Genitourinary Comments: Examination performed in the presence of a chaperone. No lesions to the external genitalia. Moderate amount of blood in the vaginal vault. Cervical os is multiparous in appearance with no petechia. No masses or lesions to the vaginal wall. There is cervical motion tenderness and right adnexal tenderness. Uterus is soft and symmetrical  Musculoskeletal: She exhibits no edema.  Neurological: She is alert.  Skin: Skin is warm and dry. No erythema.  Psychiatric: She has a normal mood and affect. Her behavior is normal.  Nursing note and vitals reviewed.    ED Treatments / Results  Labs (all labs ordered are listed, but only abnormal results are displayed) Labs Reviewed  WET PREP, GENITAL - Abnormal; Notable for the following:       Result Value   Clue Cells Wet Prep HPF POC PRESENT (*)    WBC, Wet Prep HPF POC MANY (*)    All other components within normal limits  COMPREHENSIVE METABOLIC PANEL - Abnormal; Notable for the following:    Potassium 3.3 (*)    Glucose, Bld 124 (*)    AST 75 (*)    ALT 73 (*)    All other components within normal limits  CBC - Abnormal; Notable for the following:    WBC 3.1 (*)    All other components within normal limits  URINALYSIS, ROUTINE W REFLEX MICROSCOPIC - Abnormal; Notable for the following:    Hgb urine dipstick LARGE (*)    Protein, ur 30 (*)    Squamous Epithelial / LPF 0-5 (*)    All other components within normal limits  LIPASE, BLOOD  I-STAT BETA HCG BLOOD, ED ([facility], [facility], [facility] ONLY)  GC/CHLAMYDIA PROBE AMP ([place]) NOT AT [facility]    EKG  EKG Interpretation None       Radiology Ct Abdomen Pelvis W Contrast  Result Date: 12/03/2016 CLINICAL DATA:  46 year old female with history of right lower quadrant abdominal pain, nausea and vomiting since yesterday evening.  EXAM: CT ABDOMEN AND PELVIS WITH CONTRAST TECHNIQUE: Multidetector CT imaging of the abdomen and pelvis was performed using the standard protocol following bolus administration of intravenous contrast. CONTRAST:  170mL ISOVUE-300 IOPAMIDOL (ISOVUE-300) INJECTION 61% COMPARISON:  CT the abdomen and pelvis 06/18/2016. FINDINGS: Lower chest: Unremarkable. Hepatobiliary: No cystic or solid hepatic lesions. No intra or extrahepatic biliary ductal dilatation. Gallbladder is normal in appearance. Pancreas: No pancreatic mass. No pancreatic ductal dilatation. No pancreatic or peripancreatic fluid or inflammatory changes. Spleen: Unremarkable. Adrenals/Urinary Tract: Subcentimeter low-attenuation lesion in the upper pole the left kidney is too small to characterize, but statistically likely to represent a tiny cyst. Small areas of cortical thinning in the right kidney likely reflect areas of scarring. No suspicious renal lesions. No hydroureteronephrosis. Urinary bladder is normal in appearance. Bilateral adrenal glands are normal in appearance. Stomach/Bowel: Normal appearance of the stomach. No pathologic dilatation of small bowel or colon. The appendix is not confidently identified and may be surgically absent. Regardless, there are no inflammatory changes noted adjacent to the cecum to suggest the presence of an acute appendicitis at this time. Vascular/Lymphatic: Aortic atherosclerosis, without evidence of aneurysm or dissection in the abdominal or pelvic vasculature. No lymphadenopathy noted in the abdomen or pelvis. Reproductive: Fluid in the endometrial canal. Bilateral tubal ligation clips. Ovaries are unremarkable in appearance. 1.4 x 2.3 cm low-attenuation lesion again noted along the right side of the labia (axial image 85 of series 3), presumably a Bartholin gland cyst. Other: No significant volume of ascites.  No pneumoperitoneum. Musculoskeletal: There are no aggressive appearing lytic or blastic lesions noted  in the visualized portions of the skeleton. IMPRESSION: 1. No acute findings in the abdomen or pelvis to account for the patient's symptoms. 2. Incidental findings, as above, similar to prior studies. Electronically Signed   By: Vinnie Langton M.D.   On: 12/03/2016 19:37    Procedures Procedures (including critical care time)  Medications Ordered in ED Medications  ondansetron (ZOFRAN-ODT) disintegrating tablet 4 mg (4 mg Oral Given 12/03/16  1344)  iopamidol (ISOVUE-300) 61 % injection (100 mLs  Contrast Given 12/03/16 1902)  oxyCODONE-acetaminophen (PERCOCET/ROXICET) 5-325 MG per tablet 1 tablet (1 tablet Oral Given 12/03/16 1954)     Initial Impression / Assessment and Plan / ED Course  I have reviewed the triage vital signs and the nursing notes.  Pertinent labs & imaging results that were available during my care of the patient were reviewed by me and considered in my medical decision making (see chart for details).     Patient with abnormal vaginal bleeding 1.5 months after ablation procedure. Afebrile, vital signs are at patient's baseline. No leukocytosis and no evidence of anemia. Wet prep shows clue cells and WBCs the patient is not complaining of abnormal vaginal discharge. Remainder of lab work is reassuring. CT of the abdomen and pelvis shows no acute findings but does show what appears to be a Bartholin's gland cyst which is similar to prior studies. No evidence of ectopic pregnancy, TOA, PID, or ovarian torsion. Doubt appendicitis. She stable for discharge home with medication for bleeding cessation and follow up with her OB/GYN for reevaluation. Discussed indications for return to the ED. Pain management on the ED. Pt verbalized understanding of and agreement with plan and is safe for discharge home at this time.   Final Clinical Impressions(s) / ED Diagnoses   Final diagnoses:  Right lower quadrant abdominal pain  Abnormal vaginal bleeding    New Prescriptions Discharge  Medication List as of 12/03/2016  8:07 PM    START taking these medications   Details  Norgestimate-Ethinyl Estradiol Triphasic 0.18/0.215/0.25 MG-25 MCG tab Take 3 tablets by mouth daily., Starting Fri 12/03/2016, Until Fri 12/10/2016, Print    ondansetron (ZOFRAN) 4 MG tablet Take 1 tablet (4 mg total) by mouth every 8 (eight) hours as needed for nausea or vomiting., Starting Fri 12/03/2016, Print         Nils Flack, [place] A, PA-C 12/04/16 1619    Nat Christen, MD 12/04/16 2136

## 2016-12-06 LAB — GC/CHLAMYDIA PROBE AMP (~~LOC~~) NOT AT ARMC
Chlamydia: NEGATIVE
Neisseria Gonorrhea: NEGATIVE

## 2017-01-01 ENCOUNTER — Other Ambulatory Visit (HOSPITAL_COMMUNITY): Payer: Self-pay | Admitting: Obstetrics & Gynecology

## 2017-01-18 ENCOUNTER — Telehealth: Payer: Self-pay | Admitting: *Deleted

## 2017-01-18 MED ORDER — HYDROCHLOROTHIAZIDE 25 MG PO TABS: 50.0000 mg | ORAL_TABLET | Freq: Every day | ORAL | 0 refills | Status: DC

## 2017-01-18 NOTE — Telephone Encounter (Signed)
Fax received from pt's pharmacy for refill request of HCTZ. Message was sent to Dr. Kennon Rounds and after receiving her response I called the pt. There was no answer or voice mail and call disconnected after about 8 rings on 2 tries. Refill e-prescribed to pharmacy for 30 Teresa [name] supply per order from Dr. Kennon Rounds. No additional refills will be given from this office as pt needs to seek care and management of hypertension from PCP. This information was added as a note to pharmacist in hopes that they will deliver the information.

## 2017-02-07 ENCOUNTER — Other Ambulatory Visit: Payer: Self-pay | Admitting: Infectious Diseases

## 2017-02-07 DIAGNOSIS — R921 Mammographic calcification found on diagnostic imaging of breast: Secondary | ICD-10-CM

## 2017-02-14 ENCOUNTER — Ambulatory Visit
Admission: RE | Admit: 2017-02-14 | Discharge: 2017-02-14 | Disposition: A | Discharge status: Home / Self Care | Payer: Medicaid Other | Source: Ambulatory Visit | Attending: Infectious Diseases | Admitting: Infectious Diseases

## 2017-02-14 DIAGNOSIS — R921 Mammographic calcification found on diagnostic imaging of breast: Secondary | ICD-10-CM

## 2017-02-22 ENCOUNTER — Encounter: Payer: Self-pay | Admitting: Family Medicine

## 2017-03-10 ENCOUNTER — Emergency Department
Admission: EM | Admit: 2017-03-10 | Discharge: 2017-03-10 | Disposition: A | Discharge status: Home / Self Care | Payer: Medicaid Other | Attending: Emergency Medicine | Admitting: Emergency Medicine

## 2017-03-10 ENCOUNTER — Encounter: Payer: Self-pay | Admitting: Emergency Medicine

## 2017-03-10 ENCOUNTER — Emergency Department: Payer: Medicaid Other

## 2017-03-10 DIAGNOSIS — B20 Human immunodeficiency virus [HIV] disease: Secondary | ICD-10-CM | POA: Insufficient documentation

## 2017-03-10 DIAGNOSIS — N939 Abnormal uterine and vaginal bleeding, unspecified: Secondary | ICD-10-CM

## 2017-03-10 DIAGNOSIS — J45909 Unspecified asthma, uncomplicated: Secondary | ICD-10-CM | POA: Diagnosis not present

## 2017-03-10 DIAGNOSIS — R102 Pelvic and perineal pain: Secondary | ICD-10-CM | POA: Diagnosis not present

## 2017-03-10 DIAGNOSIS — Z87891 Personal history of nicotine dependence: Secondary | ICD-10-CM | POA: Diagnosis not present

## 2017-03-10 DIAGNOSIS — R1030 Lower abdominal pain, unspecified: Secondary | ICD-10-CM | POA: Diagnosis present

## 2017-03-10 LAB — CBC
HCT: 45.6 % (ref 35.0–47.0)
Hemoglobin: 14.7 g/dL (ref 12.0–16.0)
MCH: 28.3 pg (ref 26.0–34.0)
MCHC: 32.2 g/dL (ref 32.0–36.0)
MCV: 88 fL (ref 80.0–100.0)
Platelets: 192 10*3/uL (ref 150–440)
RBC: 5.18 MIL/uL (ref 3.80–5.20)
RDW: 14 % (ref 11.5–14.5)
WBC: 4.2 10*3/uL (ref 3.6–11.0)

## 2017-03-10 LAB — POCT PREGNANCY, URINE: Preg Test, Ur: NEGATIVE

## 2017-03-10 LAB — URINALYSIS, COMPLETE (UACMP) WITH MICROSCOPIC
Bilirubin Urine: NEGATIVE
Glucose, UA: NEGATIVE mg/dL
Ketones, ur: 5 mg/dL — AB
Nitrite: NEGATIVE
Protein, ur: 30 mg/dL — AB
Specific Gravity, Urine: 1.005 (ref 1.005–1.030)
pH: 7 (ref 5.0–8.0)

## 2017-03-10 LAB — COMPREHENSIVE METABOLIC PANEL
ALT: 78 U/L — ABNORMAL HIGH (ref 14–54)
AST: 83 U/L — ABNORMAL HIGH (ref 15–41)
Albumin: 4 g/dL (ref 3.5–5.0)
Alkaline Phosphatase: 98 U/L (ref 38–126)
Anion gap: 11 (ref 5–15)
BUN: 8 mg/dL (ref 6–20)
CO2: 24 mmol/L (ref 22–32)
Calcium: 9.4 mg/dL (ref 8.9–10.3)
Chloride: 101 mmol/L (ref 101–111)
Creatinine, Ser: 0.73 mg/dL (ref 0.44–1.00)
GFR calc Af Amer: >60 mL/min (ref >60)
GFR calc non Af Amer: >60 mL/min (ref >60)
Glucose, Bld: 127 mg/dL — ABNORMAL HIGH (ref 65–99)
Potassium: 3.5 mmol/L (ref 3.5–5.1)
Sodium: 136 mmol/L (ref 135–145)
Total Bilirubin: 1 mg/dL (ref 0.3–1.2)
Total Protein: 8.1 g/dL (ref 6.5–8.1)

## 2017-03-10 LAB — TROPONIN I: Troponin I: <0.03 ng/mL (ref <0.03)

## 2017-03-10 LAB — LIPASE, BLOOD: Lipase: 30 U/L (ref 11–51)

## 2017-03-10 MED ORDER — SODIUM CHLORIDE 0.9 % IV SOLN
Freq: Once | INTRAVENOUS | Status: AC
  Administered 2017-03-10: 08:00:00 via INTRAVENOUS

## 2017-03-10 MED ORDER — MORPHINE SULFATE (PF) 4 MG/ML IV SOLN
4.0000 mg | Freq: Once | INTRAVENOUS | Status: AC
  Administered 2017-03-10: 4 mg via INTRAVENOUS
  Filled 2017-03-10: qty 1

## 2017-03-10 MED ORDER — OXYCODONE-ACETAMINOPHEN 5-325 MG PO TABS: 1.0000 | ORAL_TABLET | Freq: Three times a day (TID) | ORAL | 0 refills | Status: DC | PRN

## 2017-03-10 MED ORDER — ONDANSETRON HCL 4 MG/2ML IJ SOLN
4.0000 mg | Freq: Once | INTRAMUSCULAR | Status: AC
  Administered 2017-03-10: 4 mg via INTRAVENOUS
  Filled 2017-03-10: qty 2

## 2017-03-10 NOTE — ED Provider Notes (Signed)
[facility] Emergency Department Provider Note       Time seen: ----------------------------------------- 7:39 AM on 03/10/2017 -----------------------------------------    I have reviewed the triage vital signs and the nursing notes.  HISTORY   Chief Complaint Abdominal Pain    HPI Teresa [name] is a 46 y.o. female with a history of HIV, pancreatitis, anemia, depression who presents to the ED for lower abdominal pain becoming more severe yesterday morning.  She does report nausea and vomiting but denies any diarrhea.  Pain is 10 out of 10 in the right lower abdomen.  She reports pain and vaginal bleeding have persisted for the past 3 weeks  Past Medical History:  Diagnosis Date  . Anemia    iron infusions x 2  . Anxiety   . Arthritis    Neck, left shoulder  . Asthma    uses inhaler 2-3 x week  . Bipolar disorder ([place])   . Depression   . Endometriosis   . Fibroids   . GERD (gastroesophageal reflux disease)   . Headache    Migraines  . Hepatitis 2009   Hep C  . HIV infection ([place])   . Pancreatitis   . Seasonal allergies   . SVD (spontaneous vaginal delivery)    x 1    Patient Active Problem List   Diagnosis Date Noted  . Iron deficiency anemia due to chronic blood loss 03/25/2014  . Abnormal uterine bleeding (AUB) 03/12/2014  . Bipolar affective disorder ([place]) 04/13/2012  . Endometriosis 04/13/2012  . Migraine headache 04/13/2012  . Hepatitis C infection 03/10/2011  . HIV disease ([place]) 03/10/2011    Past Surgical History:  Procedure Laterality Date  . CESAREAN SECTION     x2  . DILATION AND CURETTAGE OF UTERUS     x 2 - TAB  . HAND SURGERY Right    2 pins in wrist  . HYSTEROSCOPY N/A 10/11/2016   Procedure: HYSTEROSCOPY WITH HYDROTHERMAL ABLATION;  Surgeon: Donnamae Jude, MD;  Location: [facility];  Service: Gynecology;  Laterality: N/A;  Caryl Pina the HTA rep will be here.  Confirmed on 09/02/16.   Marland Kitchen LAPAROSCOPIC TUBAL LIGATION  Bilateral 10/11/2016   Procedure: LAPAROSCOPIC TUBAL LIGATION WITH FILSHIE CLIPS;  Surgeon: Donnamae Jude, MD;  Location: [facility];  Service: Gynecology;  Laterality: Bilateral;  . MOUTH SURGERY    . WISDOM TOOTH EXTRACTION      Allergies Naproxen sodium  Social History Social History   Tobacco Use  . Smoking status: Former Smoker    Packs/day: 0.25    Years: 25.00    Pack years: 6.25    Types: Cigarettes  . Smokeless tobacco: Never Used  . Tobacco comment: uses vapor occasionally  Substance Use Topics  . Alcohol use: Yes    Comment: wine occ  . Drug use: Yes    Types: Marijuana    Comment: 2-3 x per month    Review of Systems Constitutional: Negative for fever. Cardiovascular: Negative for chest pain. Respiratory: Negative for shortness of breath. Gastrointestinal: Negative for abdominal pain, vomiting and diarrhea. Genitourinary: Positive for pelvic pain, vaginal bleeding Musculoskeletal: Negative for back pain. Skin: Negative for rash. Neurological: Negative for headaches, focal weakness or numbness.  All systems negative/normal/unremarkable except as stated in the HPI  ____________________________________________   PHYSICAL EXAM:  VITAL SIGNS: ED Triage Vitals  Enc Vitals Group     BP 03/10/17 0727 (!) 142/105     Pulse Rate 03/10/17 0727 81  Resp 03/10/17 0727 16     Temp 03/10/17 0727 98 F (36.7 C)     Temp Source 03/10/17 0727 Oral     SpO2 03/10/17 0727 100 %     Weight 03/10/17 0728 165 lb (74.8 kg)     Height 03/10/17 0728 5\' 6"  (1.676 m)     Head Circumference --      Peak Flow --      Pain Score 03/10/17 0727 10     Pain Loc --      Pain Edu? --      Excl. in [place]? --     Constitutional: Alert and oriented.  Mild distress Eyes: Conjunctivae are normal. Normal extraocular movements. ENT   Head: Normocephalic and atraumatic.   Nose: No congestion/rhinnorhea.   Mouth/Throat: Mucous membranes are moist.   Neck: No  stridor. Cardiovascular: Normal rate, regular rhythm. No murmurs, rubs, or gallops. Respiratory: Normal respiratory effort without tachypnea nor retractions. Breath sounds are clear and equal bilaterally. No wheezes/rales/rhonchi. Gastrointestinal: Soft and nontender. Normal bowel sounds Musculoskeletal: Nontender with normal range of motion in extremities. No lower extremity tenderness nor edema. Neurologic:  Normal speech and language. No gross focal neurologic deficits are appreciated.  Skin:  Skin is warm, dry and intact. No rash noted. Psychiatric: Mood and affect are normal. Speech and behavior are normal.  ____________________________________________  ED COURSE:  Pertinent labs & imaging results that were available during my care of the patient were reviewed by me and considered in my medical decision making (see chart for details). Patient presents for abdominal pain and vaginal bleeding, we will assess with labs and imaging as indicated.   Procedures ____________________________________________   LABS (pertinent positives/negatives)  Labs Reviewed  COMPREHENSIVE METABOLIC PANEL - Abnormal; Notable for the following components:      Result Value   Glucose, Bld 127 (*)    AST 83 (*)    ALT 78 (*)    All other components within normal limits  URINALYSIS, COMPLETE (UACMP) WITH MICROSCOPIC - Abnormal; Notable for the following components:   Color, Urine YELLOW (*)    APPearance CLEAR (*)    Hgb urine dipstick LARGE (*)    Ketones, ur 5 (*)    Protein, ur 30 (*)    Leukocytes, UA TRACE (*)    Bacteria, UA RARE (*)    Squamous Epithelial / LPF 0-5 (*)    All other components within normal limits  URINE CULTURE  LIPASE, BLOOD  CBC  TROPONIN I  POC URINE PREG, ED  POCT PREGNANCY, URINE  Pelvic ultrasound IMPRESSION: Small bilateral ovarian simple cysts, most likely follicular. Small amount of free pelvic fluid. Exam otherwise unremarkable.  Chronic pelvic  pain. ____________________________________________  DIFFERENTIAL DIAGNOSIS   Abnormal vaginal bleeding, UTI, pyelonephritis, PID, ovarian cyst, ovarian torsion, endometriosis  FINAL ASSESSMENT AND PLAN  Pelvic pain, vaginal bleeding   Plan: Patient had presented for abnormal vaginal bleeding and pain. Patient's labs are within normal limits for her. Patient's imaging was reassuring.  She will be discharged with pain medicine and is encouraged to have close outpatient follow-up with her GYN doctor.  Her pelvic pain appears to be acute on chronic.   Earleen Newport, MD   Note: This note was generated in part or whole with voice recognition software. Voice recognition is usually quite accurate but there are transcription errors that can and very often do occur. I apologize for any typographical errors that were not detected and corrected.  Earleen Newport, MD 03/10/17 [phone]

## 2017-03-10 NOTE — ED Triage Notes (Signed)
Pt in via POV with complaints of lower abdominal pain, becoming more severe yesterday morning.  Pt reports N/V, denies diarrhea.  Pt with recent tubal ablation x 4-5 months ago, reports pain and bleeding have continued since then.  Pt tearful in triage.

## 2017-03-11 LAB — URINE CULTURE
Culture: 80000 — AB
Special Requests: NORMAL

## 2017-03-12 NOTE — Progress Notes (Signed)
ED Antimicrobial Stewardship Positive Culture Follow Up   Teresa [name] is an 46 y.o. female who presented to [facility] on 03/10/2017 with a chief complaint of abdominal pain Chief Complaint  Patient presents with  . Abdominal Pain    Recent Results (from the past 720 hour(s))  Urine culture     Status: Abnormal   Collection Time: 03/10/17  7:26 AM  Result Value Ref Range Status   Specimen Description URINE, RANDOM  Final   Special Requests Normal  Final   Culture (A)  Final    80,000 COLONIES/mL CORYNEBACTERIUM SPECIES Standardized susceptibility testing for this organism is not available. Performed at [facility], [address], [place], [place] [address]    Report Status 03/11/2017 FINAL  Final    []  Treated with , organism resistant to prescribed antimicrobial [x]  Patient discharged originally without antimicrobial agent and treatment is now indicated.   New antibiotic prescription: Ciprofloxacin 500 mg bid x 7 days, no refills called to [facility] on [address] in [place].   ED Provider: Dr. Donald Siva, [name] 03/12/2017, 4:35 PM

## 2017-04-05 ENCOUNTER — Emergency Department
Admission: EM | Admit: 2017-04-05 | Discharge: 2017-04-05 | Disposition: A | Discharge status: Home / Self Care | Payer: Medicaid Other | Attending: Student in an Organized Health Care Education/Training Program | Admitting: Student in an Organized Health Care Education/Training Program

## 2017-04-05 ENCOUNTER — Other Ambulatory Visit: Payer: Self-pay

## 2017-04-05 ENCOUNTER — Encounter: Payer: Self-pay | Admitting: Emergency Medicine

## 2017-04-05 DIAGNOSIS — Z87891 Personal history of nicotine dependence: Secondary | ICD-10-CM | POA: Insufficient documentation

## 2017-04-05 DIAGNOSIS — Z79899 Other long term (current) drug therapy: Secondary | ICD-10-CM | POA: Insufficient documentation

## 2017-04-05 DIAGNOSIS — J45909 Unspecified asthma, uncomplicated: Secondary | ICD-10-CM | POA: Insufficient documentation

## 2017-04-05 DIAGNOSIS — R0981 Nasal congestion: Secondary | ICD-10-CM | POA: Diagnosis present

## 2017-04-05 DIAGNOSIS — B20 Human immunodeficiency virus [HIV] disease: Secondary | ICD-10-CM | POA: Diagnosis not present

## 2017-04-05 DIAGNOSIS — J069 Acute upper respiratory infection, unspecified: Secondary | ICD-10-CM | POA: Insufficient documentation

## 2017-04-05 LAB — INFLUENZA PANEL BY PCR (TYPE A & B)
Influenza A By PCR: NEGATIVE
Influenza B By PCR: NEGATIVE

## 2017-04-05 MED ORDER — PREDNISONE 50 MG PO TABS: ORAL_TABLET | ORAL | 0 refills | Status: DC

## 2017-04-05 NOTE — ED Provider Notes (Signed)
[facility] Emergency Department Provider Note  ____________________________________________  Time seen: Approximately 11:41 PM  I have reviewed the triage vital signs and the nursing notes.   HISTORY  Chief Complaint Nasal Congestion    HPI Teresa [name] is a 46 y.o. female presents to the emergency department with nasal congestion, rhinorrhea, nonproductive cough, malaise, body aches and bilateral ear pain for the past 3 days.  Patient has been tolerating fluids and food by mouth with no major changes in stooling or urinary habits.  No recent travel.  No alleviating measures have been attempted.   Past Medical History:  Diagnosis Date  . Anemia    iron infusions x 2  . Anxiety   . Arthritis    Neck, left shoulder  . Asthma    uses inhaler 2-3 x week  . Bipolar disorder ([place])   . Depression   . Endometriosis   . Fibroids   . GERD (gastroesophageal reflux disease)   . Headache    Migraines  . Hepatitis 2009   Hep C  . HIV infection ([place])   . Pancreatitis   . Seasonal allergies   . SVD (spontaneous vaginal delivery)    x 1    Patient Active Problem List   Diagnosis Date Noted  . Iron deficiency anemia due to chronic blood loss 03/25/2014  . Abnormal uterine bleeding (AUB) 03/12/2014  . Bipolar affective disorder ([place]) 04/13/2012  . Endometriosis 04/13/2012  . Migraine headache 04/13/2012  . Hepatitis C infection 03/10/2011  . HIV disease ([place]) 03/10/2011    Past Surgical History:  Procedure Laterality Date  . CESAREAN SECTION     x2  . DILATION AND CURETTAGE OF UTERUS     x 2 - TAB  . HAND SURGERY Right    2 pins in wrist  . HYSTEROSCOPY N/A 10/11/2016   Procedure: HYSTEROSCOPY WITH HYDROTHERMAL ABLATION;  Surgeon: Donnamae Jude, MD;  Location: [facility];  Service: Gynecology;  Laterality: N/A;  Caryl Pina the HTA rep will be here.  Confirmed on 09/02/16.   Marland Kitchen LAPAROSCOPIC TUBAL LIGATION Bilateral 10/11/2016   Procedure: LAPAROSCOPIC  TUBAL LIGATION WITH FILSHIE CLIPS;  Surgeon: Donnamae Jude, MD;  Location: [facility];  Service: Gynecology;  Laterality: Bilateral;  . MOUTH SURGERY    . WISDOM TOOTH EXTRACTION      Prior to Admission medications   Medication Sig Start Date End Date Taking? Authorizing Provider  abacavir-dolutegravir-lamiVUDine (TRIUMEQ) 600-50-300 MG tablet Take 1 tablet by mouth daily. 07/21/16   Donnamae Jude, MD  albuterol [facility] HFA) 108 (90 Base) MCG/ACT inhaler Inhale 2 puffs into the lungs every 6 (six) hours as needed for wheezing or shortness of breath.    [provider]  busPIRone (BUSPAR) 5 MG tablet Take 5 mg by mouth 2 (two) times daily.    [provider]  cetirizine (ZYRTEC) 10 MG tablet Take 10 mg by mouth daily as needed for allergies.    [provider]  fluticasone (FLONASE) 50 MCG/ACT nasal spray Place 1 spray into both nostrils daily as needed for allergies or rhinitis.    [provider]  hydrochlorothiazide (HYDRODIURIL) 25 MG tablet Take 2 tablets (50 mg total) by mouth daily. 01/18/17   Donnamae Jude, MD  meloxicam (MOBIC) 7.5 MG tablet Take 7.5 mg by mouth 2 (two) times daily.    [provider]  Norgestimate-Ethinyl Estradiol Triphasic 0.18/0.215/0.25 MG-25 MCG tab Take 3 tablets by mouth daily. 12/03/16 12/10/16  Nils Flack,  Mina A, PA-C  ondansetron (ZOFRAN) 4 MG tablet Take 1 tablet (4 mg total) by mouth every 8 (eight) hours as needed for nausea or vomiting. 12/03/16   Nils Flack, Mina A, PA-C  oxyCODONE-acetaminophen (PERCOCET) 5-325 MG tablet Take 1-2 tablets every 8 (eight) hours as needed by mouth. 03/10/17   Earleen Newport, MD  pantoprazole (PROTONIX) 40 MG tablet Take 1 tablet (40 mg total) by mouth 2 (two) times daily. 10/21/16   Donnamae Jude, MD  predniSONE (DELTASONE) 50 MG tablet Take one tablet daily for the next five days. 04/05/17   Lannie Fields, PA-C  Vitamin D, Ergocalciferol, (DRISDOL) 50000 units CAPS capsule Take 50,000  Units by mouth every 7 (seven) days.    [provider]    Allergies Naproxen sodium  Family History  Problem Relation Age of Onset  . Breast cancer Paternal Grandmother   . Breast cancer Cousin     Social History Social History   Tobacco Use  . Smoking status: Former Smoker    Packs/day: 0.25    Years: 25.00    Pack years: 6.25    Types: Cigarettes  . Smokeless tobacco: Never Used  . Tobacco comment: uses vapor occasionally  Substance Use Topics  . Alcohol use: Yes    Comment: wine occ  . Drug use: Yes    Types: Marijuana    Comment: 2-3 x per month      Review of Systems  Constitutional: Patient has fever.  Eyes: No visual changes. No discharge ENT: Patient has congestion.  Cardiovascular: no chest pain. Respiratory: Patient has cough.  Gastrointestinal: No abdominal pain.  No nausea, no vomiting.  No diarrhea. Genitourinary: Negative for dysuria. No hematuria Musculoskeletal: Patient has myalgias.  Skin: Negative for rash, abrasions, lacerations, ecchymosis. Neurological: Patient has headache, no focal weakness or numbness.     ____________________________________________   PHYSICAL EXAM:  VITAL SIGNS: ED Triage Vitals  Enc Vitals Group     BP 04/05/17 1911 (!) 143/100     Pulse Rate 04/05/17 1911 79     Resp 04/05/17 1911 18     Temp 04/05/17 1911 98.5 F (36.9 C)     Temp Source 04/05/17 1911 Oral     SpO2 04/05/17 1911 100 %     Weight 04/05/17 1911 160 lb (72.6 kg)     Height 04/05/17 1911 5\' 7"  (1.702 m)     Head Circumference --      Peak Flow --      Pain Score 04/05/17 2120 8     Pain Loc --      Pain Edu? --      Excl. in [place]? --     Constitutional: Alert and oriented. Patient is lying supine. Eyes: Conjunctivae are normal. PERRL. EOMI. Head: Atraumatic. ENT:      Ears: Patient's right tympanic membrane is hemorrhagic with vesicular formation.  Patient's left tympanic membrane is less hemorrhagic than right, with early  evidence of vesicular formation.      Nose: No congestion/rhinnorhea.      Mouth/Throat: Mucous membranes are moist. Posterior pharynx is mildly erythematous.  Hematological/Lymphatic/Immunilogical: No cervical lymphadenopathy.  Cardiovascular: Normal rate, regular rhythm. Normal S1 and S2.  Good peripheral circulation. Respiratory: Normal respiratory effort without tachypnea or retractions. Lungs CTAB. Good air entry to the bases with no decreased or absent breath sounds. Gastrointestinal: Bowel sounds 4 quadrants. Soft and nontender to palpation. No guarding or rigidity. No palpable masses. No distention. No CVA tenderness.  Musculoskeletal: Full range of motion to all extremities. No gross deformities appreciated. Neurologic:  Normal speech and language. No gross focal neurologic deficits are appreciated.  Skin:  Skin is warm, dry and intact. No rash noted. Psychiatric: Mood and affect are normal. Speech and behavior are normal. Patient exhibits appropriate insight and judgement.    ____________________________________________   LABS (all labs ordered are listed, but only abnormal results are displayed)  Labs Reviewed  INFLUENZA PANEL BY PCR (TYPE A & B)   ____________________________________________  EKG   ____________________________________________  RADIOLOGY   No results found.  ____________________________________________    PROCEDURES  Procedure(s) performed:    Procedures    Medications - No data to display   ____________________________________________   INITIAL IMPRESSION / ASSESSMENT AND PLAN / ED COURSE  Pertinent labs & imaging results that were available during my care of the patient were reviewed by me and considered in my medical decision making (see chart for details).  Review of the [place] CSRS was performed in accordance of the [place] prior to dispensing any controlled drugs.    Assessment and plan Viral URI  Patient presents to the  emergency department with rhinorrhea, congestion, nonproductive cough and bilateral ear pain.  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  Patient was treated empirically with prednisone in an attempt to reduce significant ear pain and bilateral ear effusions.  Vital signs are reassuring prior to discharge.  All patient questions were answered.     ____________________________________________  FINAL CLINICAL IMPRESSION(S) / ED DIAGNOSES  Final diagnoses:  Viral upper respiratory tract infection      NEW MEDICATIONS STARTED DURING THIS VISIT:  ED Discharge Orders        Ordered    predniSONE (DELTASONE) 50 MG tablet     04/05/17 2109          This chart was dictated using voice recognition software/Dragon. Despite best efforts to proofread, errors can occur which can change the meaning. Any change was purely unintentional.    Lannie Fields, PA-C 04/05/17 2345    Merlyn Lot, MD 04/05/17 2350

## 2017-04-05 NOTE — ED Triage Notes (Signed)
Patient ambulatory to triage with steady gait, without difficulty or distress noted; pt reports recent cold symptoms and sinus pressure, right ear hearing muffled x 2 days

## 2017-04-11 ENCOUNTER — Other Ambulatory Visit: Payer: Self-pay

## 2017-04-11 ENCOUNTER — Encounter: Payer: Self-pay | Admitting: Emergency Medicine

## 2017-04-11 ENCOUNTER — Emergency Department
Admission: EM | Admit: 2017-04-11 | Discharge: 2017-04-11 | Disposition: A | Discharge status: Home / Self Care | Payer: Medicaid Other | Attending: Emergency Medicine | Admitting: Emergency Medicine

## 2017-04-11 DIAGNOSIS — Z87891 Personal history of nicotine dependence: Secondary | ICD-10-CM | POA: Diagnosis not present

## 2017-04-11 DIAGNOSIS — F319 Bipolar disorder, unspecified: Secondary | ICD-10-CM | POA: Diagnosis not present

## 2017-04-11 DIAGNOSIS — Z21 Asymptomatic human immunodeficiency virus [HIV] infection status: Secondary | ICD-10-CM | POA: Insufficient documentation

## 2017-04-11 DIAGNOSIS — Z79899 Other long term (current) drug therapy: Secondary | ICD-10-CM | POA: Diagnosis not present

## 2017-04-11 DIAGNOSIS — J45909 Unspecified asthma, uncomplicated: Secondary | ICD-10-CM | POA: Diagnosis not present

## 2017-04-11 DIAGNOSIS — J0100 Acute maxillary sinusitis, unspecified: Secondary | ICD-10-CM | POA: Diagnosis not present

## 2017-04-11 DIAGNOSIS — F419 Anxiety disorder, unspecified: Secondary | ICD-10-CM | POA: Insufficient documentation

## 2017-04-11 DIAGNOSIS — H9203 Otalgia, bilateral: Secondary | ICD-10-CM

## 2017-04-11 MED ORDER — TRAMADOL HCL 50 MG PO TABS: 50.0000 mg | ORAL_TABLET | Freq: Four times a day (QID) | ORAL | 0 refills | Status: DC | PRN

## 2017-04-11 MED ORDER — AMOXICILLIN 500 MG PO CAPS: 500.0000 mg | ORAL_CAPSULE | Freq: Three times a day (TID) | ORAL | 0 refills | Status: DC

## 2017-04-11 MED ORDER — BENZONATATE 100 MG PO CAPS: 200.0000 mg | ORAL_CAPSULE | Freq: Three times a day (TID) | ORAL | 0 refills | Status: AC | PRN

## 2017-04-11 MED ORDER — FEXOFENADINE-PSEUDOEPHED ER 60-120 MG PO TB12: 1.0000 | ORAL_TABLET | Freq: Two times a day (BID) | ORAL | 0 refills | Status: DC

## 2017-04-11 NOTE — ED Triage Notes (Signed)
Bilateral earache, began 8 days ago. Some drainage. Finished prednisone yesterday.

## 2017-04-11 NOTE — ED Provider Notes (Signed)
[facility] Emergency Department Provider Note   ____________________________________________   None    (approximate)  I have reviewed the triage vital signs and the nursing notes.   HISTORY  Chief Complaint Otalgia    HPI Teresa [name] is a 46 y.o. female patient complaining of bilateral earache with nasal congestion frontal headache and intermittent rhinorrhea. Patient also complaining of decreased hearing in the left ear. Patient denies vertigo. Patient was diagnosed rhinitis 1 week ago and given 6 days of prednisone which she finished yesterday. Patient denies fever or chills with this complaint. Patient denies nausea, vomiting, diarrhea. Patient rates the pain as 8/10. Patient describes pain as "pressure". No palliative measures for complaint.   Past Medical History:  Diagnosis Date  . Anemia    iron infusions x 2  . Anxiety   . Arthritis    Neck, left shoulder  . Asthma    uses inhaler 2-3 x week  . Bipolar disorder ([place])   . Depression   . Endometriosis   . Fibroids   . GERD (gastroesophageal reflux disease)   . Headache    Migraines  . Hepatitis 2009   Hep C  . HIV infection ([place])   . Pancreatitis   . Seasonal allergies   . SVD (spontaneous vaginal delivery)    x 1    Patient Active Problem List   Diagnosis Date Noted  . Iron deficiency anemia due to chronic blood loss 03/25/2014  . Abnormal uterine bleeding (AUB) 03/12/2014  . Bipolar affective disorder ([place]) 04/13/2012  . Endometriosis 04/13/2012  . Migraine headache 04/13/2012  . Hepatitis C infection 03/10/2011  . HIV disease ([place]) 03/10/2011    Past Surgical History:  Procedure Laterality Date  . CESAREAN SECTION     x2  . DILATION AND CURETTAGE OF UTERUS     x 2 - TAB  . HAND SURGERY Right    2 pins in wrist  . HYSTEROSCOPY N/A 10/11/2016   Procedure: HYSTEROSCOPY WITH HYDROTHERMAL ABLATION;  Surgeon: Donnamae Jude, MD;  Location: [facility];  Service: Gynecology;   Laterality: N/A;  Caryl Pina the HTA rep will be here.  Confirmed on 09/02/16.   Marland Kitchen LAPAROSCOPIC TUBAL LIGATION Bilateral 10/11/2016   Procedure: LAPAROSCOPIC TUBAL LIGATION WITH FILSHIE CLIPS;  Surgeon: Donnamae Jude, MD;  Location: [facility];  Service: Gynecology;  Laterality: Bilateral;  . MOUTH SURGERY    . WISDOM TOOTH EXTRACTION      Prior to Admission medications   Medication Sig Start Date End Date Taking? Authorizing Provider  abacavir-dolutegravir-lamiVUDine (TRIUMEQ) 600-50-300 MG tablet Take 1 tablet by mouth daily. 07/21/16   Donnamae Jude, MD  albuterol [facility] HFA) 108 (90 Base) MCG/ACT inhaler Inhale 2 puffs into the lungs every 6 (six) hours as needed for wheezing or shortness of breath.    [provider]  amoxicillin (AMOXIL) 500 MG capsule Take 1 capsule (500 mg total) by mouth 3 (three) times daily. 04/11/17   Sable Feil, PA-C  benzonatate (TESSALON PERLES) 100 MG capsule Take 2 capsules (200 mg total) by mouth 3 (three) times daily as needed for cough. 04/11/17 04/11/18  Sable Feil, PA-C  busPIRone (BUSPAR) 5 MG tablet Take 5 mg by mouth 2 (two) times daily.    [provider]  cetirizine (ZYRTEC) 10 MG tablet Take 10 mg by mouth daily as needed for allergies.    [provider]  fexofenadine-pseudoephedrine (ALLEGRA-D) 60-120 MG 12 hr tablet Take 1 tablet  by mouth 2 (two) times daily. 04/11/17   Sable Feil, PA-C  fluticasone (FLONASE) 50 MCG/ACT nasal spray Place 1 spray into both nostrils daily as needed for allergies or rhinitis.    [provider]  hydrochlorothiazide (HYDRODIURIL) 25 MG tablet Take 2 tablets (50 mg total) by mouth daily. 01/18/17   Donnamae Jude, MD  meloxicam (MOBIC) 7.5 MG tablet Take 7.5 mg by mouth 2 (two) times daily.    [provider]  Norgestimate-Ethinyl Estradiol Triphasic 0.18/0.215/0.25 MG-25 MCG tab Take 3 tablets by mouth daily. 12/03/16 12/10/16  Rodell Perna A, PA-C  ondansetron (ZOFRAN)  4 MG tablet Take 1 tablet (4 mg total) by mouth every 8 (eight) hours as needed for nausea or vomiting. 12/03/16   Nils Flack, Mina A, PA-C  oxyCODONE-acetaminophen (PERCOCET) 5-325 MG tablet Take 1-2 tablets every 8 (eight) hours as needed by mouth. 03/10/17   Earleen Newport, MD  pantoprazole (PROTONIX) 40 MG tablet Take 1 tablet (40 mg total) by mouth 2 (two) times daily. 10/21/16   Donnamae Jude, MD  predniSONE (DELTASONE) 50 MG tablet Take one tablet daily for the next five days. 04/05/17   Lannie Fields, PA-C  traMADol (ULTRAM) 50 MG tablet Take 1 tablet (50 mg total) by mouth every 6 (six) hours as needed for moderate pain. 04/11/17   Sable Feil, PA-C  Vitamin D, Ergocalciferol, (DRISDOL) 50000 units CAPS capsule Take 50,000 Units by mouth every 7 (seven) days.    [provider]    Allergies Naproxen sodium  Family History  Problem Relation Age of Onset  . Breast cancer Paternal Grandmother   . Breast cancer Cousin     Social History Social History   Tobacco Use  . Smoking status: Former Smoker    Packs/day: 0.25    Years: 25.00    Pack years: 6.25    Types: Cigarettes  . Smokeless tobacco: Never Used  . Tobacco comment: uses vapor occasionally  Substance Use Topics  . Alcohol use: Yes    Comment: wine occ  . Drug use: Yes    Types: Marijuana    Comment: 2-3 x per month    Review of Systems  Constitutional: No fever/chills Eyes: No visual changes. ENT: Nasal congestion, right lateral hip pain, and decreased hearing Cardiovascular: Denies chest pain. Respiratory: Denies shortness of breath. Nonproductive cough Gastrointestinal: No abdominal pain.  No nausea, no vomiting.  No diarrhea.  No constipation. Genitourinary: Negative for dysuria. Musculoskeletal: Negative for back pain. Skin: Negative for rash. Neurological: Negative for headaches, focal weakness or numbness. Psychiatric:Anxiety, bipolar, and  depression. Endocrine:Naproxen Hematological/Lymphatic: Allergic/Immunilogical: HIV and hepatitis ____________________________________________   PHYSICAL EXAM:  VITAL SIGNS: ED Triage Vitals  Enc Vitals Group     BP 04/11/17 1106 (!) 173/108     Pulse Rate 04/11/17 1106 77     Resp 04/11/17 1106 18     Temp 04/11/17 1106 98.5 F (36.9 C)     Temp Source 04/11/17 1106 Oral     SpO2 04/11/17 1106 100 %     Weight 04/11/17 1107 160 lb (72.6 kg)     Height 04/11/17 1107 5\' 7"  (1.702 m)     Head Circumference --      Peak Flow --      Pain Score 04/11/17 1106 8     Pain Loc --      Pain Edu? --      Excl. in [place]? --     Constitutional:  Alert and oriented. Well appearing and in no acute distress. Eyes: Conjunctivae are normal. PERRL. EOMI. Head: Atraumatic. Nose: Nasal congestion and thick greenish rhinorrhea. Edematous bilateral TMs.  Mouth/Throat: Mucous membranes are moist.  Oropharynx non-erythematous. Neck: No stridor. Hematological/Lymphatic/Immunilogical: No cervical lymphadenopathy. Cardiovascular: Normal rate, regular rhythm. Grossly normal heart sounds.  Good peripheral circulation. Elevated blood pressure Respiratory: Normal respiratory effort.  No retractions. Lungs CTAB. Nonproductive cough Neurologic:  Normal speech and language. No gross focal neurologic deficits are appreciated. No gait instability. Skin:  Skin is warm, dry and intact. No rash noted. Psychiatric: Mood and affect are normal. Speech and behavior are normal.  ____________________________________________   LABS (all labs ordered are listed, but only abnormal results are displayed)  Labs Reviewed - No data to display ____________________________________________  EKG   ____________________________________________  RADIOLOGY  No results found.  ____________________________________________   PROCEDURES  Procedure(s) performed: None  Procedures  Critical Care performed:  No  ____________________________________________   INITIAL IMPRESSION / ASSESSMENT AND PLAN / ED COURSE  As part of my medical decision making, I reviewed the following data within the electronic MEDICAL RECORD NUMBER    Sinusitis and otalgia secondary Eustachian tube dysfunction. Sheet given discharge care instructions and advised take medication as directed. Patient [facility].    ____________________________________________   FINAL CLINICAL IMPRESSION(S) / ED DIAGNOSES  Final diagnoses:  Subacute maxillary sinusitis  Otalgia of both ears     ED Discharge Orders        Ordered    amoxicillin (AMOXIL) 500 MG capsule  3 times daily     04/11/17 1224    fexofenadine-pseudoephedrine (ALLEGRA-D) 60-120 MG 12 hr tablet  2 times daily     04/11/17 1224    traMADol (ULTRAM) 50 MG tablet  Every 6 hours PRN     04/11/17 1224    benzonatate (TESSALON PERLES) 100 MG capsule  3 times daily PRN     04/11/17 1224       Note:  This document was prepared using Dragon voice recognition software and may include unintentional dictation errors.    Sable Feil, PA-C 04/11/17 1232    Lavonia Drafts, MD 04/11/17 [phone]

## 2017-04-11 NOTE — ED Triage Notes (Signed)
First nurse note-pt reports ears popped and cannot hear as well. ambulatory without difficulty.

## 2017-04-17 ENCOUNTER — Emergency Department
Admission: EM | Admit: 2017-04-17 | Discharge: 2017-04-17 | Disposition: A | Discharge status: Home / Self Care | Payer: Medicaid Other | Attending: Emergency Medicine | Admitting: Emergency Medicine

## 2017-04-17 ENCOUNTER — Encounter: Payer: Self-pay | Admitting: Emergency Medicine

## 2017-04-17 ENCOUNTER — Other Ambulatory Visit: Payer: Self-pay

## 2017-04-17 DIAGNOSIS — H6692 Otitis media, unspecified, left ear: Secondary | ICD-10-CM | POA: Diagnosis not present

## 2017-04-17 DIAGNOSIS — Z791 Long term (current) use of non-steroidal anti-inflammatories (NSAID): Secondary | ICD-10-CM | POA: Diagnosis not present

## 2017-04-17 DIAGNOSIS — Z79899 Other long term (current) drug therapy: Secondary | ICD-10-CM | POA: Insufficient documentation

## 2017-04-17 DIAGNOSIS — F17290 Nicotine dependence, other tobacco product, uncomplicated: Secondary | ICD-10-CM | POA: Diagnosis not present

## 2017-04-17 DIAGNOSIS — T700XXD Otitic barotrauma, subsequent encounter: Secondary | ICD-10-CM | POA: Diagnosis not present

## 2017-04-17 DIAGNOSIS — H9203 Otalgia, bilateral: Secondary | ICD-10-CM | POA: Diagnosis present

## 2017-04-17 DIAGNOSIS — J45909 Unspecified asthma, uncomplicated: Secondary | ICD-10-CM | POA: Diagnosis not present

## 2017-04-17 DIAGNOSIS — B20 Human immunodeficiency virus [HIV] disease: Secondary | ICD-10-CM | POA: Insufficient documentation

## 2017-04-17 MED ORDER — AMOXICILLIN-POT CLAVULANATE 875-125 MG PO TABS: 1.0000 | ORAL_TABLET | Freq: Two times a day (BID) | ORAL | 0 refills | Status: DC

## 2017-04-17 MED ORDER — CIPROFLOXACIN HCL 0.3 % OP SOLN: 2.0000 [drp] | OPHTHALMIC | 0 refills | Status: DC

## 2017-04-17 NOTE — ED Triage Notes (Signed)
Pt c/o bilateral ear pain and decreased hearing. Pt states that she was seen recently for same. Pt states that nasal congestion is better but the ear pain has continued.

## 2017-04-17 NOTE — ED Notes (Signed)
First nurse note  Presents with bilateral ear pain and pressure  States her ears are clogged up

## 2017-04-17 NOTE — ED Provider Notes (Signed)
[facility] Emergency Department Provider Note ____________________________________________  Time seen: Approximately 4:07 PM  I have reviewed the triage vital signs and the nursing notes.   HISTORY  Chief Complaint Otalgia    HPI Teresa [name] is a 46 y.o. female who presents to the emergency department for evaluation and treatment of bilateral ear pain.  She has been evaluated here approximately a week ago for the same thing.  She states that on her first visit, she had nasal congestion and facial pain and pressure which has since resolved.  She states that the pain in her left ear has continued to get worse even though she is taken her medications as prescribed including the antibiotic. Past Medical History:  Diagnosis Date  . Anemia    iron infusions x 2  . Anxiety   . Arthritis    Neck, left shoulder  . Asthma    uses inhaler 2-3 x week  . Bipolar disorder ([place])   . Depression   . Endometriosis   . Fibroids   . GERD (gastroesophageal reflux disease)   . Headache    Migraines  . Hepatitis 2009   Hep C  . HIV infection ([place])   . Pancreatitis   . Seasonal allergies   . SVD (spontaneous vaginal delivery)    x 1    Patient Active Problem List   Diagnosis Date Noted  . Iron deficiency anemia due to chronic blood loss 03/25/2014  . Abnormal uterine bleeding (AUB) 03/12/2014  . Bipolar affective disorder ([place]) 04/13/2012  . Endometriosis 04/13/2012  . Migraine headache 04/13/2012  . Hepatitis C infection 03/10/2011  . HIV disease ([place]) 03/10/2011    Past Surgical History:  Procedure Laterality Date  . CESAREAN SECTION     x2  . DILATION AND CURETTAGE OF UTERUS     x 2 - TAB  . HAND SURGERY Right    2 pins in wrist  . HYSTEROSCOPY N/A 10/11/2016   Procedure: HYSTEROSCOPY WITH HYDROTHERMAL ABLATION;  Surgeon: Donnamae Jude, MD;  Location: [facility];  Service: Gynecology;  Laterality: N/A;  Caryl Pina the HTA rep will be here.  Confirmed on  09/02/16.   Marland Kitchen LAPAROSCOPIC TUBAL LIGATION Bilateral 10/11/2016   Procedure: LAPAROSCOPIC TUBAL LIGATION WITH FILSHIE CLIPS;  Surgeon: Donnamae Jude, MD;  Location: [facility];  Service: Gynecology;  Laterality: Bilateral;  . MOUTH SURGERY    . WISDOM TOOTH EXTRACTION      Prior to Admission medications   Medication Sig Start Date End Date Taking? Authorizing Provider  abacavir-dolutegravir-lamiVUDine (TRIUMEQ) 600-50-300 MG tablet Take 1 tablet by mouth daily. 07/21/16   Donnamae Jude, MD  albuterol [facility] HFA) 108 (90 Base) MCG/ACT inhaler Inhale 2 puffs into the lungs every 6 (six) hours as needed for wheezing or shortness of breath.    [provider]  amoxicillin-clavulanate (AUGMENTIN) 875-125 MG tablet Take 1 tablet by mouth 2 (two) times daily. 04/17/17   Camille Dragan B, FNP  benzonatate (TESSALON PERLES) 100 MG capsule Take 2 capsules (200 mg total) by mouth 3 (three) times daily as needed for cough. 04/11/17 04/11/18  Sable Feil, PA-C  busPIRone (BUSPAR) 5 MG tablet Take 5 mg by mouth 2 (two) times daily.    [provider]  cetirizine (ZYRTEC) 10 MG tablet Take 10 mg by mouth daily as needed for allergies.    [provider]  ciprofloxacin (CILOXAN) 0.3 % ophthalmic solution Place 2 drops into the left ear every 4 (  four) hours while awake. 04/17/17   Kerilyn Cortner B, FNP  fexofenadine-pseudoephedrine (ALLEGRA-D) 60-120 MG 12 hr tablet Take 1 tablet by mouth 2 (two) times daily. 04/11/17   Sable Feil, PA-C  fluticasone (FLONASE) 50 MCG/ACT nasal spray Place 1 spray into both nostrils daily as needed for allergies or rhinitis.    [provider]  hydrochlorothiazide (HYDRODIURIL) 25 MG tablet Take 2 tablets (50 mg total) by mouth daily. 01/18/17   Donnamae Jude, MD  meloxicam (MOBIC) 7.5 MG tablet Take 7.5 mg by mouth 2 (two) times daily.    [provider]  Norgestimate-Ethinyl Estradiol Triphasic 0.18/0.215/0.25 MG-25 MCG tab Take 3  tablets by mouth daily. 12/03/16 12/10/16  Rodell Perna A, PA-C  ondansetron (ZOFRAN) 4 MG tablet Take 1 tablet (4 mg total) by mouth every 8 (eight) hours as needed for nausea or vomiting. 12/03/16   Nils Flack, Mina A, PA-C  oxyCODONE-acetaminophen (PERCOCET) 5-325 MG tablet Take 1-2 tablets every 8 (eight) hours as needed by mouth. 03/10/17   Earleen Newport, MD  pantoprazole (PROTONIX) 40 MG tablet Take 1 tablet (40 mg total) by mouth 2 (two) times daily. 10/21/16   Donnamae Jude, MD  predniSONE (DELTASONE) 50 MG tablet Take one tablet daily for the next five days. 04/05/17   Lannie Fields, PA-C  traMADol (ULTRAM) 50 MG tablet Take 1 tablet (50 mg total) by mouth every 6 (six) hours as needed for moderate pain. 04/11/17   Sable Feil, PA-C  Vitamin D, Ergocalciferol, (DRISDOL) 50000 units CAPS capsule Take 50,000 Units by mouth every 7 (seven) days.    [provider]    Allergies Naproxen sodium  Family History  Problem Relation Age of Onset  . Breast cancer Paternal Grandmother   . Breast cancer Cousin     Social History Social History   Tobacco Use  . Smoking status: Former Smoker    Packs/day: 0.25    Years: 25.00    Pack years: 6.25    Types: Cigarettes  . Smokeless tobacco: Never Used  . Tobacco comment: uses vapor occasionally  Substance Use Topics  . Alcohol use: Yes    Comment: wine occ  . Drug use: Yes    Types: Marijuana    Comment: 2-3 x per month    Review of Systems Constitutional: Negative for fever.  Positive for decreased ability to hear from bilateral ear(s). Eyes: Negative for discharge or drainage. ENT:       Positive for otalgia in both ear(s).      Negative for rhinorrhea or congestion.      Negative for sore throat. Gastrointestinal: Negative for nausea, vomiting, or diarrhea. Musculoskeletal: Negative for myalgias. Skin: Negative for rash, lesions, or wounds. Neurological: Negative for  paresthesias. ____________________________________________   PHYSICAL EXAM:  VITAL SIGNS: ED Triage Vitals  Enc Vitals Group     BP 04/17/17 1032 (!) 146/92     Pulse Rate 04/17/17 1032 90     Resp 04/17/17 1032 16     Temp 04/17/17 1032 98.5 F (36.9 C)     Temp Source 04/17/17 1032 Oral     SpO2 04/17/17 1032 100 %     Weight 04/17/17 1033 160 lb (72.6 kg)     Height 04/17/17 1033 5\' 7"  (1.702 m)     Head Circumference --      Peak Flow --      Pain Score 04/17/17 1032 8     Pain Loc --  Pain Edu? --      Excl. in [place]? --     Constitutional: Well appearing. Eyes: Conjunctivae are clear without discharge or drainage. Ears:       Right TM appears normal and intact.      Left TM appears dull, erythematous, intact.  Pain with movement of the pinna Head: Atraumatic. Nose: No rhinorrhea or sinus pain on percussion. Mouth/Throat: Oropharynx appears normal. Tonsils normal without exudate. Hematological/Lymphatic/Immunilogical: No palpable anterior cervical lymphadenopathy. Cardiovascular: Heart rate and rhythm are regular without murmur, gallop, or rub appreciated. Respiratory: Breath sounds are clear throughout to auscultation.  Neurologic:  Alert and oriented x 4. Skin: Intact and without rash, lesion, or wound on exposed skin surfaces. ____________________________________________   LABS (all labs ordered are listed, but only abnormal results are displayed)  Labs Reviewed - No data to display ____________________________________________   RADIOLOGY  Not indicated ____________________________________________   PROCEDURES  Procedure(s) performed: Not indicated  ____________________________________________   INITIAL IMPRESSION / ASSESSMENT AND PLAN / ED COURSE  46 year old female presenting to the emergency department for a second evaluation of ear ache and decreased ability to hear.  She will be treated with Cipro drops and Augmentin.  She was advised to  stop the amoxicillin.  She was advised that she will need to follow-up with the ENT specialist if these medications have not improved her symptoms over the next week or so.  She was encouraged to return to the emergency department for symptoms of change or worsen if she is unable to schedule an appointment.  Pertinent labs & imaging results that were available during my care of the patient were reviewed by me and considered in my medical decision making (see chart for details). ____________________________________________   FINAL CLINICAL IMPRESSION(S) / ED DIAGNOSES  Final diagnoses:  Barotitis media, subsequent encounter  Left otitis media, unspecified otitis media type    This SmartLink is deprecated. Use AVSMEDLIST instead to display the medication list for a patient.  If controlled substance prescribed during this visit, 12 month history viewed on the [place] prior to issuing an initial prescription for Schedule II or III opiod.   Note:  This document was prepared using Dragon voice recognition software and may include unintentional dictation errors.     Victorino Dike, FNP 04/17/17 1610    Nena Polio, MD 04/17/17 2100

## 2017-07-31 ENCOUNTER — Other Ambulatory Visit: Payer: Self-pay

## 2017-07-31 ENCOUNTER — Emergency Department
Admission: EM | Admit: 2017-07-31 | Discharge: 2017-07-31 | Disposition: A | Discharge status: Home / Self Care | Payer: Medicaid Other | Attending: Emergency Medicine | Admitting: Emergency Medicine

## 2017-07-31 ENCOUNTER — Encounter: Payer: Self-pay | Admitting: *Deleted

## 2017-07-31 DIAGNOSIS — Z79899 Other long term (current) drug therapy: Secondary | ICD-10-CM | POA: Insufficient documentation

## 2017-07-31 DIAGNOSIS — N809 Endometriosis, unspecified: Secondary | ICD-10-CM | POA: Insufficient documentation

## 2017-07-31 DIAGNOSIS — R102 Pelvic and perineal pain: Secondary | ICD-10-CM | POA: Diagnosis present

## 2017-07-31 DIAGNOSIS — J45909 Unspecified asthma, uncomplicated: Secondary | ICD-10-CM | POA: Diagnosis not present

## 2017-07-31 DIAGNOSIS — Z87891 Personal history of nicotine dependence: Secondary | ICD-10-CM | POA: Diagnosis not present

## 2017-07-31 LAB — POCT PREGNANCY, URINE: Preg Test, Ur: NEGATIVE

## 2017-07-31 LAB — CBC
HCT: 43.9 % (ref 35.0–47.0)
Hemoglobin: 14.5 g/dL (ref 12.0–16.0)
MCH: 27.6 pg (ref 26.0–34.0)
MCHC: 33 g/dL (ref 32.0–36.0)
MCV: 83.7 fL (ref 80.0–100.0)
Platelets: 197 10*3/uL (ref 150–440)
RBC: 5.25 MIL/uL — ABNORMAL HIGH (ref 3.80–5.20)
RDW: 13.1 % (ref 11.5–14.5)
WBC: 4.2 10*3/uL (ref 3.6–11.0)

## 2017-07-31 LAB — URINALYSIS, COMPLETE (UACMP) WITH MICROSCOPIC
Bacteria, UA: NONE SEEN
Bilirubin Urine: NEGATIVE
Glucose, UA: NEGATIVE mg/dL
Ketones, ur: 5 mg/dL — AB
Leukocytes, UA: NEGATIVE
Nitrite: NEGATIVE
Protein, ur: NEGATIVE mg/dL
Specific Gravity, Urine: 1.011 (ref 1.005–1.030)
pH: 6 (ref 5.0–8.0)

## 2017-07-31 LAB — COMPREHENSIVE METABOLIC PANEL
ALT: 100 U/L — ABNORMAL HIGH (ref 14–54)
AST: 135 U/L — ABNORMAL HIGH (ref 15–41)
Albumin: 4.2 g/dL (ref 3.5–5.0)
Alkaline Phosphatase: 109 U/L (ref 38–126)
Anion gap: 11 (ref 5–15)
BUN: 6 mg/dL (ref 6–20)
CO2: 21 mmol/L — ABNORMAL LOW (ref 22–32)
Calcium: 8.9 mg/dL (ref 8.9–10.3)
Chloride: 109 mmol/L (ref 101–111)
Creatinine, Ser: 0.6 mg/dL (ref 0.44–1.00)
GFR calc Af Amer: >60 mL/min (ref >60)
GFR calc non Af Amer: >60 mL/min (ref >60)
Glucose, Bld: 144 mg/dL — ABNORMAL HIGH (ref 65–99)
Potassium: 3.2 mmol/L — ABNORMAL LOW (ref 3.5–5.1)
Sodium: 141 mmol/L (ref 135–145)
Total Bilirubin: 0.9 mg/dL (ref 0.3–1.2)
Total Protein: 8.6 g/dL — ABNORMAL HIGH (ref 6.5–8.1)

## 2017-07-31 LAB — LIPASE, BLOOD: Lipase: 31 U/L (ref 11–51)

## 2017-07-31 MED ORDER — KETOROLAC TROMETHAMINE 30 MG/ML IJ SOLN
30.0000 mg | Freq: Once | INTRAMUSCULAR | Status: AC
  Administered 2017-07-31: 30 mg via INTRAVENOUS

## 2017-07-31 MED ORDER — ONDANSETRON HCL 4 MG/2ML IJ SOLN
4.0000 mg | Freq: Once | INTRAMUSCULAR | Status: AC | PRN
  Administered 2017-07-31: 4 mg via INTRAVENOUS
  Filled 2017-07-31: qty 2

## 2017-07-31 MED ORDER — KETOROLAC TROMETHAMINE 30 MG/ML IJ SOLN
INTRAMUSCULAR | Status: AC
  Administered 2017-07-31: 15:00:00
  Filled 2017-07-31: qty 1

## 2017-07-31 MED ORDER — MORPHINE SULFATE (PF) 4 MG/ML IV SOLN
4.0000 mg | Freq: Once | INTRAVENOUS | Status: AC
  Administered 2017-07-31: 4 mg via INTRAVENOUS
  Filled 2017-07-31: qty 1

## 2017-07-31 MED ORDER — SODIUM CHLORIDE 0.9 % IV BOLUS
1000.0000 mL | Freq: Once | INTRAVENOUS | Status: AC
  Administered 2017-07-31: 1000 mL via INTRAVENOUS

## 2017-07-31 MED ORDER — KETOROLAC TROMETHAMINE 10 MG PO TABS: 10.0000 mg | ORAL_TABLET | Freq: Four times a day (QID) | ORAL | 0 refills | Status: DC | PRN

## 2017-07-31 MED ORDER — HYDROCHLOROTHIAZIDE 25 MG PO TABS: 50.0000 mg | ORAL_TABLET | Freq: Every day | ORAL | 0 refills | Status: DC

## 2017-07-31 NOTE — ED Triage Notes (Signed)
Patient c/o vomiting and diarrhea since 0500 this am.

## 2017-07-31 NOTE — ED Notes (Signed)
Report to Rachel RN

## 2017-07-31 NOTE — ED Provider Notes (Signed)
[facility] Emergency Department Provider Note   ____________________________________________    I have reviewed the triage vital signs and the nursing notes.   HISTORY  Chief Complaint Emesis     HPI Teresa [name] is a 47 y.o. female who presents with complaints of pelvic pain which she reports she has quite frequently and is caused by endometriosis.  Patient reports she had an ablation 5 months ago which was unsuccessful in relieving her pain.  She reports she has this quite frequently.  she describes it as a cramping and sharp pain in her lower pelvis more on the right than left.  She has not taken anything for this.  No fevers or chills.  No dysuria.  No nausea or vomiting.  History as described below  Past Medical History:  Diagnosis Date  . Anemia    iron infusions x 2  . Anxiety   . Arthritis    Neck, left shoulder  . Asthma    uses inhaler 2-3 x week  . Bipolar disorder ([place])   . Depression   . Endometriosis   . Fibroids   . GERD (gastroesophageal reflux disease)   . Headache    Migraines  . Hepatitis 2009   Hep C  . HIV infection ([place])   . Pancreatitis   . Seasonal allergies   . SVD (spontaneous vaginal delivery)    x 1    Patient Active Problem List   Diagnosis Date Noted  . Iron deficiency anemia due to chronic blood loss 03/25/2014  . Abnormal uterine bleeding (AUB) 03/12/2014  . Bipolar affective disorder ([place]) 04/13/2012  . Endometriosis 04/13/2012  . Migraine headache 04/13/2012  . Hepatitis C infection 03/10/2011  . HIV disease ([place]) 03/10/2011    Past Surgical History:  Procedure Laterality Date  . CESAREAN SECTION     x2  . DILATION AND CURETTAGE OF UTERUS     x 2 - TAB  . HAND SURGERY Right    2 pins in wrist  . HYSTEROSCOPY N/A 10/11/2016   Procedure: HYSTEROSCOPY WITH HYDROTHERMAL ABLATION;  Surgeon: Donnamae Jude, MD;  Location: [facility];  Service: Gynecology;  Laterality: N/A;  Caryl Pina the HTA rep will  be here.  Confirmed on 09/02/16.   Marland Kitchen LAPAROSCOPIC TUBAL LIGATION Bilateral 10/11/2016   Procedure: LAPAROSCOPIC TUBAL LIGATION WITH FILSHIE CLIPS;  Surgeon: Donnamae Jude, MD;  Location: [facility];  Service: Gynecology;  Laterality: Bilateral;  . MOUTH SURGERY    . WISDOM TOOTH EXTRACTION      Prior to Admission medications   Medication Sig Start Date End Date Taking? Authorizing Provider  abacavir-dolutegravir-lamiVUDine (TRIUMEQ) 600-50-300 MG tablet Take 1 tablet by mouth daily. 07/21/16   Donnamae Jude, MD  albuterol [facility] HFA) 108 (90 Base) MCG/ACT inhaler Inhale 2 puffs into the lungs every 6 (six) hours as needed for wheezing or shortness of breath.    [provider]  amoxicillin-clavulanate (AUGMENTIN) 875-125 MG tablet Take 1 tablet by mouth 2 (two) times daily. 04/17/17   Triplett, Cari B, FNP  benzonatate (TESSALON PERLES) 100 MG capsule Take 2 capsules (200 mg total) by mouth 3 (three) times daily as needed for cough. 04/11/17 04/11/18  Sable Feil, PA-C  busPIRone (BUSPAR) 5 MG tablet Take 5 mg by mouth 2 (two) times daily.    [provider]  cetirizine (ZYRTEC) 10 MG tablet Take 10 mg by mouth daily as needed for allergies.    [provider]  ciprofloxacin (CILOXAN) 0.3 % ophthalmic solution Place 2 drops into the left ear every 4 (four) hours while awake. 04/17/17   Triplett, Cari B, FNP  fexofenadine-pseudoephedrine (ALLEGRA-D) 60-120 MG 12 hr tablet Take 1 tablet by mouth 2 (two) times daily. 04/11/17   Sable Feil, PA-C  fluticasone (FLONASE) 50 MCG/ACT nasal spray Place 1 spray into both nostrils daily as needed for allergies or rhinitis.    [provider]  hydrochlorothiazide (HYDRODIURIL) 25 MG tablet Take 2 tablets (50 mg total) by mouth daily. 01/18/17   Donnamae Jude, MD  ketorolac (TORADOL) 10 MG tablet Take 1 tablet (10 mg total) by mouth every 6 (six) hours as needed. 07/31/17   Lavonia Drafts, MD  meloxicam (MOBIC) 7.5 MG  tablet Take 7.5 mg by mouth 2 (two) times daily.    [provider]  Norgestimate-Ethinyl Estradiol Triphasic 0.18/0.215/0.25 MG-25 MCG tab Take 3 tablets by mouth daily. 12/03/16 12/10/16  Rodell Perna A, PA-C  ondansetron (ZOFRAN) 4 MG tablet Take 1 tablet (4 mg total) by mouth every 8 (eight) hours as needed for nausea or vomiting. 12/03/16   Nils Flack, Mina A, PA-C  oxyCODONE-acetaminophen (PERCOCET) 5-325 MG tablet Take 1-2 tablets every 8 (eight) hours as needed by mouth. 03/10/17   Earleen Newport, MD  pantoprazole (PROTONIX) 40 MG tablet Take 1 tablet (40 mg total) by mouth 2 (two) times daily. 10/21/16   Donnamae Jude, MD  predniSONE (DELTASONE) 50 MG tablet Take one tablet daily for the next five days. 04/05/17   Lannie Fields, PA-C  traMADol (ULTRAM) 50 MG tablet Take 1 tablet (50 mg total) by mouth every 6 (six) hours as needed for moderate pain. 04/11/17   Sable Feil, PA-C  Vitamin D, Ergocalciferol, (DRISDOL) 50000 units CAPS capsule Take 50,000 Units by mouth every 7 (seven) days.    [provider]     Allergies Naproxen sodium  Family History  Problem Relation Age of Onset  . Breast cancer Paternal Grandmother   . Breast cancer Cousin     Social History Social History   Tobacco Use  . Smoking status: Former Smoker    Packs/day: 0.25    Years: 25.00    Pack years: 6.25    Types: Cigarettes  . Smokeless tobacco: Never Used  . Tobacco comment: uses vapor occasionally  Substance Use Topics  . Alcohol use: Yes    Comment: wine occ  . Drug use: Yes    Types: Marijuana    Comment: 2-3 x per month    Review of Systems  Constitutional: No fever/chills Eyes: No visual changes.  ENT: No sore throat. Cardiovascular: Denies chest pain. Respiratory: Denies shortness of breath. Gastrointestinal: As above.  No nausea, no vomiting.   Genitourinary: Negative for dysuria. Musculoskeletal: Negative for back pain. Skin: Negative for  rash. Neurological: Negative for headaches    ____________________________________________   PHYSICAL EXAM:  VITAL SIGNS: ED Triage Vitals  Enc Vitals Group     BP 07/31/17 1117 (!) 187/116     Pulse Rate 07/31/17 1117 65     Resp 07/31/17 1117 18     Temp 07/31/17 1117 97.7 F (36.5 C)     Temp Source 07/31/17 1117 Oral     SpO2 07/31/17 1117 99 %     Weight 07/31/17 1120 72.6 kg (160 lb)     Height 07/31/17 1120 1.702 m (5\' 7" )     Head Circumference --      Peak  Flow --      Pain Score 07/31/17 1119 10     Pain Loc --      Pain Edu? --      Excl. in [place]? --     Constitutional: Alert and oriented. No acute distress. Pleasant and interactive Eyes: Conjunctivae are normal.  Head: Atraumatic. Nose: No congestion/rhinnorhea. Mouth/Throat: Mucous membranes are moist.    Cardiovascular: Normal rate, regular rhythm. Grossly normal heart sounds.  Good peripheral circulation. Respiratory: Normal respiratory effort.  No retractions. Lungs CTAB. Gastrointestinal: Soft and nontender. No distention.  No CVA tenderness. Genitourinary: deferred Musculoskeletal: Warm and well perfused Neurologic:  Normal speech and language. No gross focal neurologic deficits are appreciated.  Skin:  Skin is warm, dry and intact. No rash noted. Psychiatric: Mood and affect are normal. Speech and behavior are normal.  ____________________________________________   LABS (all labs ordered are listed, but only abnormal results are displayed)  Labs Reviewed  COMPREHENSIVE METABOLIC PANEL - Abnormal; Notable for the following components:      Result Value   Potassium 3.2 (*)    CO2 21 (*)    Glucose, Bld 144 (*)    Total Protein 8.6 (*)    AST 135 (*)    ALT 100 (*)    All other components within normal limits  CBC - Abnormal; Notable for the following components:   RBC 5.25 (*)    All other components within normal limits  URINALYSIS, COMPLETE (UACMP) WITH MICROSCOPIC - Abnormal; Notable for  the following components:   Color, Urine YELLOW (*)    APPearance HAZY (*)    Hgb urine dipstick LARGE (*)    Ketones, ur 5 (*)    Squamous Epithelial / LPF 0-5 (*)    All other components within normal limits  LIPASE, BLOOD  POC URINE PREG, ED  POCT PREGNANCY, URINE   ____________________________________________  EKG  None ____________________________________________  RADIOLOGY  None ____________________________________________   PROCEDURES  Procedure(s) performed: No  Procedures   Critical Care performed: No ____________________________________________   INITIAL IMPRESSION / ASSESSMENT AND PLAN / ED COURSE  Pertinent labs & imaging results that were available during my care of the patient were reviewed by me and considered in my medical decision making (see chart for details).  Patient well-appearing, mild tenderness in the right lower quadrant.  She reports this is consistent with her endometriosis and typically she requires IV pain medication for this.  Blood work is overall reassuring, chronically elevated AST ALT.  Urinalysis unremarkable  Morphine and Zofran given with some improvement, patient reports an approximate allergy but is able to tolerate Motrin.  She is willing to try Toradol.  ----------------------------------------- 3:03 PM on 07/31/2017 -----------------------------------------  Patient reports her pain has resolved after Toradol.  Will discharge home with ketorolac, outpatient follow-up with GYN   ____________________________________________   FINAL CLINICAL IMPRESSION(S) / ED DIAGNOSES  Final diagnoses:  Endometriosis        Note:  This document was prepared using Dragon voice recognition software and may include unintentional dictation errors.    Lavonia Drafts, MD 07/31/17 1504

## 2018-03-10 ENCOUNTER — Ambulatory Visit: Payer: Medicaid Other | Admitting: Family Medicine

## 2018-04-26 DIAGNOSIS — D649 Anemia, unspecified: Secondary | ICD-10-CM

## 2018-04-26 HISTORY — DX: Anemia, unspecified: D64.9

## 2018-05-25 ENCOUNTER — Ambulatory Visit: Payer: Medicaid Other | Admitting: Infectious Diseases

## 2018-06-01 ENCOUNTER — Other Ambulatory Visit
Admission: RE | Admit: 2018-06-01 | Discharge: 2018-06-01 | Disposition: A | Discharge status: Home / Self Care | Payer: Medicaid Other | Source: Ambulatory Visit | Attending: Infectious Diseases | Admitting: Infectious Diseases

## 2018-06-01 ENCOUNTER — Ambulatory Visit: Payer: Medicaid Other | Attending: Infectious Diseases | Admitting: Infectious Diseases

## 2018-06-01 ENCOUNTER — Encounter: Payer: Self-pay | Admitting: Infectious Diseases

## 2018-06-01 VITALS — BP 185/107 | HR 71 | Temp 98.1°F | Wt 174.0 lb

## 2018-06-01 DIAGNOSIS — I10 Essential (primary) hypertension: Secondary | ICD-10-CM

## 2018-06-01 DIAGNOSIS — N920 Excessive and frequent menstruation with regular cycle: Secondary | ICD-10-CM

## 2018-06-01 DIAGNOSIS — Z886 Allergy status to analgesic agent status: Secondary | ICD-10-CM

## 2018-06-01 DIAGNOSIS — Z791 Long term (current) use of non-steroidal anti-inflammatories (NSAID): Secondary | ICD-10-CM

## 2018-06-01 DIAGNOSIS — B20 Human immunodeficiency virus [HIV] disease: Secondary | ICD-10-CM | POA: Insufficient documentation

## 2018-06-01 DIAGNOSIS — F329 Major depressive disorder, single episode, unspecified: Secondary | ICD-10-CM

## 2018-06-01 DIAGNOSIS — B1920 Unspecified viral hepatitis C without hepatic coma: Secondary | ICD-10-CM | POA: Diagnosis not present

## 2018-06-01 DIAGNOSIS — F419 Anxiety disorder, unspecified: Secondary | ICD-10-CM

## 2018-06-01 DIAGNOSIS — Z9114 Patient's other noncompliance with medication regimen: Secondary | ICD-10-CM

## 2018-06-01 DIAGNOSIS — Z87891 Personal history of nicotine dependence: Secondary | ICD-10-CM

## 2018-06-01 DIAGNOSIS — R1031 Right lower quadrant pain: Secondary | ICD-10-CM | POA: Diagnosis not present

## 2018-06-01 DIAGNOSIS — N809 Endometriosis, unspecified: Secondary | ICD-10-CM

## 2018-06-01 DIAGNOSIS — N946 Dysmenorrhea, unspecified: Secondary | ICD-10-CM

## 2018-06-01 DIAGNOSIS — D219 Benign neoplasm of connective and other soft tissue, unspecified: Secondary | ICD-10-CM

## 2018-06-01 LAB — COMPREHENSIVE METABOLIC PANEL
ALT: 49 U/L — ABNORMAL HIGH (ref 0–44)
AST: 70 U/L — ABNORMAL HIGH (ref 15–41)
Albumin: 3.7 g/dL (ref 3.5–5.0)
Alkaline Phosphatase: 97 U/L (ref 38–126)
Anion gap: 5 (ref 5–15)
BUN: 10 mg/dL (ref 6–20)
CO2: 26 mmol/L (ref 22–32)
Calcium: 9.1 mg/dL (ref 8.9–10.3)
Chloride: 105 mmol/L (ref 98–111)
Creatinine, Ser: 0.61 mg/dL (ref 0.44–1.00)
GFR calc Af Amer: >60 mL/min (ref >60)
GFR calc non Af Amer: >60 mL/min (ref >60)
Glucose, Bld: 99 mg/dL (ref 70–99)
Potassium: 3.9 mmol/L (ref 3.5–5.1)
Sodium: 136 mmol/L (ref 135–145)
Total Bilirubin: 0.8 mg/dL (ref 0.3–1.2)
Total Protein: 8.2 g/dL — ABNORMAL HIGH (ref 6.5–8.1)

## 2018-06-01 MED ORDER — HYDROCHLOROTHIAZIDE 25 MG PO TABS: 25.0000 mg | ORAL_TABLET | Freq: Every day | ORAL | 0 refills | Status: DC

## 2018-06-01 MED ORDER — ABACAVIR-DOLUTEGRAVIR-LAMIVUD 600-50-300 MG PO TABS: 1.0000 | ORAL_TABLET | Freq: Every day | ORAL | 1 refills | Status: DC

## 2018-06-01 NOTE — Patient Instructions (Addendum)
You are here to engage in HIV care- You have been off meds for nearly 1 yr- you used to be on triumeq and will be restarted today- will send labs- your BP was 174/102 You take too many motrins a day and that can cause BP to be high0 discontinue or reduce motrin to 2-3 a day and then stop it  Talk to your PCP- given appt for next Monday- meanwhile will send a prescription for hydrochlorthiazide

## 2018-06-01 NOTE — Progress Notes (Signed)
NAME: Teresa [name]  DOB: 27-Dec-1970  MRN: [id number]  Date/Time: 06/01/2018 10:02 AM Subjective:  REASON FOR CONSULT: to engage in HIV care ? Teresa [name] is a 48 y.o. female with a history of HIV/AIDS diagnosed during her pregnancy in 2009 is here to engage in care- She also ahs [facility] , She was followed by Dr.Fitzgerald but was non compliant due to insurance issues.she used to take triumeq but has not been on any meds since 2018. Labs were last done in 2018.april 2018 Vl < 20 ad cd4 was 568 ( 47%). She ha snot taken treatment for harvoni. Recently she has had severe Vaginal bleed and had seen gyn and hysterectomy is being discussed. They wanted her to re-engage in HIV care and restart HAART before surgery She has no specific complaints- says she has ignored her health- also noted to have high BP -one time was prescribed HCTZ and lisinopril but not taking any meds  HIV diagnosed 2009 Nadir Cd4 Unknown -says it was slow OI ? HAARt history Truvada  isentress Triumeq Acquired thru-heterosexual contact Genotype-06/01/16 NRTI AbacavirZiagen Sensitive  RAMs*: None DidanosineVidexSensitive  RAMs*: None Emtricitabine EmtrivaSensitive  RAMs*: None LamivudineEpivir Sensitive  RAMs*: None Stavudine ZeritSensitive  RAMs*: None Tenofovir Viread Sensitive  RAMs*: None ZidovudineRetrovir Sensitive  RAMs*: None NNRTI Efavirenz SustivaSensitive  RAMs*: None EtravirineIntelenceSensitive  RAMs*: None NevirapineViramune Sensitive  RAMs*: None Rilpivirine EdurantSensitive  RAMs*: None INI DolutegravirTivicaySensitive  RAMs*: L74M, T97A ElvitegravirVitekta Resistance Possible1  RAMs*: L74M, T97A Raltegravir Isentress Resistance Possible1  RAMs*: L74M,  T97A PI AtazanavirReyatazSensitive  RAMs*: I62V Atazanavir/rReyataz / rSensitive  RAMs*: I62V Darunavir/r Prezista / r Sensitive  RAMs*: None Fosamprenavir/r Lexiva / r Sensitive  RAMs*: None Indinavir/r Crixivan / r Sensitive  RAMs*: None Lopinavir KaletraSensitive  RAMs*: None NelfinavirViracept Sensitive  RAMs*: None Ritonavir Norvir Sensitive  RAMs*: None Saquinavir/rInvirase / r Sensitive  RAMs*: I62V Tipranavir/rAptivus / rSensitive  RAMs*: None *RAMs = Resistance Associated Mutations observed Summary of Mutations Observed: RT: V35E, T39K, Q102K, D123E, C162S, E169D, D177E, G196E, I202V, F214L, A272P, R277K, V293I, E297K IN: S17N, V72I, L74M, T97A, V113I, T125V, T206S, V234L, D256E PR: R41K, I62V, L63P, I64M Genotype Comments (clinical significance may vary)  1 - The resistance impact of a single T97A substitution can be different in treatment naive and treatment experienced populations. Phenotypic testing may provide additional information for samples containing T97A. Assessment of drug susceptibility is based upon detected mutations and interpreted using an advanced proprietary algorithm (version 16).    ?PMH . Hepatitis C infection  . Contraception  . Endometriosis  . Migraine headache  . Bipolar affective disorder (CMS-HCC)  . Abnormal uterine bleeding (AUB), unspecified  . Iron deficiency anemia due to chronic blood loss  . Chronic endometritis  Pancreatitis Fibroid    Past Surgical History:  Procedure Laterality Date  . CESAREAN SECTION     x2  . DILATION AND CURETTAGE OF UTERUS     x 2 - TAB  . HAND SURGERY Right    2 pins in wrist  . HYSTEROSCOPY N/A 10/11/2016   Procedure: HYSTEROSCOPY WITH HYDROTHERMAL ABLATION;  Surgeon: Donnamae Jude, MD;  Location: [facility];  Service: Gynecology;  Laterality: N/A;  Caryl Pina the HTA rep will be here.  Confirmed  on 09/02/16.   Marland Kitchen LAPAROSCOPIC TUBAL LIGATION Bilateral 10/11/2016   Procedure: LAPAROSCOPIC TUBAL LIGATION WITH FILSHIE CLIPS;  Surgeon: Donnamae Jude, MD;  Location: [facility];  Service: Gynecology;  Laterality: Bilateral;  . MOUTH SURGERY    . WISDOM  TOOTH EXTRACTION      SH Former smoker Former drug use Lives with her daughter   FH High blood pressure (Hypertension) Mother  . Diabetes type II Mother  . Lung cancer Father  . Lung cancer Maternal Grandmother  . Stomach cancer Paternal Grandmother    Allergies  Allergen Reactions  . Naproxen Sodium Hives    Can take ibuprofen without causing any hives   ? Current Outpatient Medications  Medication Sig Dispense Refill  . abacavir-dolutegravir-lamiVUDine (TRIUMEQ) 600-50-300 MG tablet Take 1 tablet by mouth daily. (Patient not taking: Reported on 06/01/2018) 30 tablet   . albuterol (PROAIR HFA) 108 (90 Base) MCG/ACT inhaler Inhale 2 puffs into the lungs every 6 (six) hours as needed for wheezing or shortness of breath.    Marland Kitchen amoxicillin-clavulanate (AUGMENTIN) 875-125 MG tablet Take 1 tablet by mouth 2 (two) times daily. (Patient not taking: Reported on 06/01/2018) 20 tablet 0  . busPIRone (BUSPAR) 5 MG tablet Take 5 mg by mouth 2 (two) times daily.    . cetirizine (ZYRTEC) 10 MG tablet Take 10 mg by mouth daily as needed for allergies.    . ciprofloxacin (CILOXAN) 0.3 % ophthalmic solution Place 2 drops into the left ear every 4 (four) hours while awake. (Patient not taking: Reported on 06/01/2018) 5 mL 0  . fexofenadine-pseudoephedrine (ALLEGRA-D) 60-120 MG 12 hr tablet Take 1 tablet by mouth 2 (two) times daily. (Patient not taking: Reported on 06/01/2018) 20 tablet 0  . fluticasone (FLONASE) 50 MCG/ACT nasal spray Place 1 spray into both nostrils daily as needed for allergies or rhinitis.    . hydrochlorothiazide (HYDRODIURIL) 25 MG tablet Take 2 tablets (50 mg total) by mouth daily. (Patient not taking: Reported on 06/01/2018) 60 tablet 0  .  ketorolac (TORADOL) 10 MG tablet Take 1 tablet (10 mg total) by mouth every 6 (six) hours as needed. (Patient not taking: Reported on 06/01/2018) 20 tablet 0  . meloxicam (MOBIC) 7.5 MG tablet Take 7.5 mg by mouth 2 (two) times daily.    . Norgestimate-Ethinyl Estradiol Triphasic 0.18/0.215/0.25 MG-25 MCG tab Take 3 tablets by mouth daily. 1 Package 11  . ondansetron (ZOFRAN) 4 MG tablet Take 1 tablet (4 mg total) by mouth every 8 (eight) hours as needed for nausea or vomiting. (Patient not taking: Reported on 06/01/2018) 10 tablet 0  . oxyCODONE-acetaminophen (PERCOCET) 5-325 MG tablet Take 1-2 tablets every 8 (eight) hours as needed by mouth. (Patient not taking: Reported on 06/01/2018) 20 tablet 0  . pantoprazole (PROTONIX) 40 MG tablet Take 1 tablet (40 mg total) by mouth 2 (two) times daily. (Patient not taking: Reported on 06/01/2018) 60 tablet 2  . predniSONE (DELTASONE) 50 MG tablet Take one tablet daily for the next five days. (Patient not taking: Reported on 06/01/2018) 5 tablet 0  . traMADol (ULTRAM) 50 MG tablet Take 1 tablet (50 mg total) by mouth every 6 (six) hours as needed for moderate pain. (Patient not taking: Reported on 06/01/2018) 12 tablet 0  . Vitamin D, Ergocalciferol, (DRISDOL) 50000 units CAPS capsule Take 50,000 Units by mouth every 7 (seven) days.     No current facility-administered medications for this visit.     REVIEW OF SYSTEMS:  Const: negative fever, negative chills, negative weight loss Eyes: negative diplopia or visual changes, negative eye pain ENT: negative coryza, negative sore throat Resp: negative cough, hemoptysis, dyspnea Cards: negative for chest pain, palpitations, lower extremity edema GU: negative for frequency, dysuria and hematuria GI- rt lower quadrant  abdominal pain- takes 9 tablets of motrin 200mg /every day and also BC powder Skin: negative for rash and pruritus Heme: negative for easy bruising and gum/nose bleeding MS: , back pain  Neurolo: headaches,  NO dizziness, vertigo, memory problems  Psych: feelings of anxiety, depression   Objective:  VITALS:  BP (!) 185/107 (BP Location: Left Arm, Patient Position: Sitting, Cuff Size: Normal)   Pulse 71   Temp 98.1 F (36.7 C) (Oral)   Wt 174 lb (78.9 kg)   BMI 27.25 kg/m  PHYSICAL EXAM:  General: Alert, cooperative, no distress, appears stated age.  Head: Normocephalic, without obvious abnormality, atraumatic. Eyes: Conjunctivae clear, anicteric sclerae. Pupils are equal Nose: Nares normal. No drainage or sinus tenderness. Throat: Lips, mucosa, and tongue normal. No Thrush Neck: Supple, symmetrical, no adenopathy, thyroid: non tender no carotid bruit and no JVD. Back: No CVA tenderness. Lungs: Clear to auscultation bilaterally. No Wheezing or Rhonchi. No rales. Heart: Regular rate and rhythm, no murmur, rub or gallop. Abdomen: Soft, non-tender,not distended. Bowel sounds normal. No masses csection scar Extremities: Extremities normal, atraumatic, no cyanosis. No edema. No clubbing Skin: No rashes or lesions. Not Jaundiced Lymph: Cervical, supraclavicular normal. Neurologic: Grossly non-focal Pertinent Labs none  Health maintenance Vaccination pneumovac- 23 Prevnar-13 HepB HepA TdaP Flu Herpes zoster- HPV ______________________ labs RPR HEPC ab Lipid CMv TOXO -IGG/IgM HIV VL Cd4 quantiferon Gold GC/CHL [id number] Genotype HIV antibody  Preventive  Dental Colonoscopy Opthal Cervical pap mammogram  Impression/Recommendation 48 yr female here to engage in HIV care HIV /AIDS- not on treatment for the past year or more was on Triumeq before- willget labs today and restart triumeq today. Reinforced adherence  Genotype - resistance to raltegravir and elvitegravir  HTN- not on any meds- likey NSAID contributing to this- asked her to discontinue motrin -appt made for her PCP next week- will give HCTZ for a week until she sees her PCP  HEPC- not been on  treatment- will consider Rx in the future   ?Dysmenorrhea, menorrhagia- endometriosis and fibroids- followed by GYN-hysterctomy in the future  Health maintenance need to be updated Vaccination status need to be updated  Follow 3-4 weeks

## 2018-06-02 LAB — T-HELPER CELLS CD4/CD8 %
% CD 4 Pos. Lymph.: 38.7 % (ref 30.8–58.5)
Absolute CD 4 Helper: 464 /uL (ref 359–1519)
Basophils Absolute: 0 10*3/uL (ref 0.0–0.2)
Basos: 1 %
CD3+CD4+ Cells/CD3+CD8+ Cells Bld: 1.16 (ref 0.92–3.72)
CD3+CD8+ Cells # Bld: 400 /uL (ref 109–897)
CD3+CD8+ Cells NFr Bld: 33.3 % (ref 12.0–35.5)
EOS (ABSOLUTE): 0 10*3/uL (ref 0.0–0.4)
Eos: 0 %
Hematocrit: 41.3 % (ref 34.0–46.6)
Hemoglobin: 13.4 g/dL (ref 11.1–15.9)
Immature Grans (Abs): 0 10*3/uL (ref 0.0–0.1)
Immature Granulocytes: 0 %
Lymphocytes Absolute: 1.2 10*3/uL (ref 0.7–3.1)
Lymphs: 45 %
MCH: 26.1 pg — ABNORMAL LOW (ref 26.6–33.0)
MCHC: 32.4 g/dL (ref 31.5–35.7)
MCV: 81 fL (ref 79–97)
Monocytes Absolute: 0.3 10*3/uL (ref 0.1–0.9)
Monocytes: 12 %
Neutrophils Absolute: 1.2 10*3/uL — ABNORMAL LOW (ref 1.4–7.0)
Neutrophils: 42 %
Platelets: 174 10*3/uL (ref 150–450)
RBC: 5.13 x10E6/uL (ref 3.77–5.28)
RDW: 13.2 % (ref 11.7–15.4)
WBC: 2.8 10*3/uL — ABNORMAL LOW (ref 3.4–10.8)

## 2018-06-02 LAB — HCV RNA QUANT

## 2018-06-02 LAB — HIV-1 RNA QUANT-NO REFLEX-BLD
HIV 1 RNA Quant: 5030 copies/mL
LOG10 HIV-1 RNA: 3.702 log10copy/mL

## 2018-06-02 LAB — HCV RNA (INTERNATIONAL UNITS)
HCV RNA (International Units): 10500000 IU/mL
HCV log10: 7.021 log10 IU/mL

## 2018-06-02 LAB — RPR: RPR Ser Ql: NONREACTIVE

## 2018-06-04 LAB — QUANTIFERON-TB GOLD PLUS: QuantiFERON-TB Gold Plus: NEGATIVE

## 2018-06-04 LAB — QUANTIFERON-TB GOLD PLUS (RQFGPL)
QuantiFERON Mitogen Value: >10 IU/mL
QuantiFERON Nil Value: 0.02 IU/mL
QuantiFERON TB1 Ag Value: 0.03 IU/mL
QuantiFERON TB2 Ag Value: 0.03 IU/mL

## 2018-06-05 DIAGNOSIS — I10 Essential (primary) hypertension: Secondary | ICD-10-CM | POA: Insufficient documentation

## 2018-06-12 ENCOUNTER — Emergency Department
Admission: EM | Admit: 2018-06-12 | Discharge: 2018-06-13 | Disposition: A | Discharge status: Home / Self Care | Payer: Medicaid Other | Attending: Student in an Organized Health Care Education/Training Program | Admitting: Student in an Organized Health Care Education/Training Program

## 2018-06-12 ENCOUNTER — Emergency Department: Payer: Medicaid Other

## 2018-06-12 ENCOUNTER — Encounter: Payer: Self-pay | Admitting: Emergency Medicine

## 2018-06-12 ENCOUNTER — Other Ambulatory Visit: Payer: Self-pay

## 2018-06-12 DIAGNOSIS — Z79899 Other long term (current) drug therapy: Secondary | ICD-10-CM | POA: Insufficient documentation

## 2018-06-12 DIAGNOSIS — N809 Endometriosis, unspecified: Secondary | ICD-10-CM | POA: Insufficient documentation

## 2018-06-12 DIAGNOSIS — B20 Human immunodeficiency virus [HIV] disease: Secondary | ICD-10-CM | POA: Insufficient documentation

## 2018-06-12 DIAGNOSIS — J45909 Unspecified asthma, uncomplicated: Secondary | ICD-10-CM | POA: Diagnosis not present

## 2018-06-12 DIAGNOSIS — Z87891 Personal history of nicotine dependence: Secondary | ICD-10-CM | POA: Insufficient documentation

## 2018-06-12 DIAGNOSIS — F1210 Cannabis abuse, uncomplicated: Secondary | ICD-10-CM | POA: Diagnosis not present

## 2018-06-12 DIAGNOSIS — R102 Pelvic and perineal pain: Secondary | ICD-10-CM

## 2018-06-12 DIAGNOSIS — N939 Abnormal uterine and vaginal bleeding, unspecified: Secondary | ICD-10-CM | POA: Diagnosis not present

## 2018-06-12 LAB — CBC
HCT: 40 % (ref 36.0–46.0)
Hemoglobin: 12.9 g/dL (ref 12.0–15.0)
MCH: 26.1 pg (ref 26.0–34.0)
MCHC: 32.3 g/dL (ref 30.0–36.0)
MCV: 81 fL (ref 80.0–100.0)
Platelets: 197 10*3/uL (ref 150–400)
RBC: 4.94 MIL/uL (ref 3.87–5.11)
RDW: 14 % (ref 11.5–15.5)
WBC: 4.9 10*3/uL (ref 4.0–10.5)
nRBC: 0 % (ref 0.0–0.2)

## 2018-06-12 LAB — URINALYSIS, COMPLETE (UACMP) WITH MICROSCOPIC
Bacteria, UA: NONE SEEN
Bilirubin Urine: NEGATIVE
Glucose, UA: NEGATIVE mg/dL
Ketones, ur: NEGATIVE mg/dL
Nitrite: NEGATIVE
Protein, ur: 100 mg/dL — AB
RBC / HPF: >50 RBC/hpf — ABNORMAL HIGH (ref 0–5)
Specific Gravity, Urine: 1.027 (ref 1.005–1.030)
Squamous Epithelial / HPF: NONE SEEN (ref 0–5)
pH: 6 (ref 5.0–8.0)

## 2018-06-12 LAB — COMPREHENSIVE METABOLIC PANEL
ALT: 48 U/L — ABNORMAL HIGH (ref 0–44)
AST: 62 U/L — ABNORMAL HIGH (ref 15–41)
Albumin: 3.8 g/dL (ref 3.5–5.0)
Alkaline Phosphatase: 98 U/L (ref 38–126)
Anion gap: 5 (ref 5–15)
BUN: 9 mg/dL (ref 6–20)
CO2: 25 mmol/L (ref 22–32)
Calcium: 8.9 mg/dL (ref 8.9–10.3)
Chloride: 105 mmol/L (ref 98–111)
Creatinine, Ser: 0.82 mg/dL (ref 0.44–1.00)
GFR calc Af Amer: >60 mL/min (ref >60)
GFR calc non Af Amer: >60 mL/min (ref >60)
Glucose, Bld: 109 mg/dL — ABNORMAL HIGH (ref 70–99)
Potassium: 3.6 mmol/L (ref 3.5–5.1)
Sodium: 135 mmol/L (ref 135–145)
Total Bilirubin: 0.8 mg/dL (ref 0.3–1.2)
Total Protein: 8.3 g/dL — ABNORMAL HIGH (ref 6.5–8.1)

## 2018-06-12 LAB — POCT PREGNANCY, URINE: Preg Test, Ur: NEGATIVE

## 2018-06-12 MED ORDER — SODIUM CHLORIDE 0.9 % IV BOLUS
500.0000 mL | Freq: Once | INTRAVENOUS | Status: AC
  Administered 2018-06-12: 500 mL via INTRAVENOUS

## 2018-06-12 MED ORDER — HYDROCODONE-ACETAMINOPHEN 5-325 MG PO TABS: 1.0000 | ORAL_TABLET | ORAL | 0 refills | Status: DC | PRN

## 2018-06-12 MED ORDER — MORPHINE SULFATE (PF) 4 MG/ML IV SOLN
4.0000 mg | INTRAVENOUS | Status: DC | PRN
  Administered 2018-06-12: 4 mg via INTRAVENOUS
  Filled 2018-06-12: qty 1

## 2018-06-12 MED ORDER — PROMETHAZINE HCL 25 MG/ML IJ SOLN
12.5000 mg | Freq: Four times a day (QID) | INTRAMUSCULAR | Status: DC | PRN
  Administered 2018-06-12: 12.5 mg via INTRAVENOUS
  Filled 2018-06-12: qty 1

## 2018-06-12 NOTE — ED Provider Notes (Signed)
[name] Emergency Department Provider Note    First MD Initiated Contact with Patient 06/12/18 2131     (approximate)  I have reviewed the triage vital signs and the nursing notes.   HISTORY  Chief Complaint Abdominal Pain and Abdominal Cramping    HPI Teresa [name] is a 48 y.o. female with a history of endometriosis and uterine fibroids presents the ER for evaluation of severe pelvic pain feels like severe endometriosis.  States she is having vaginal bleeding and just started her menstrual cycle.  Denies any chance of being pregnant.  Denies any discharge.  States the pain is moderate to severe.  Is not improving with Motrin or Tylenol at home.  Denies any flank pain.  States the pain is sometimes radiating down her legs due to severe cramps.    Past Medical History:  Diagnosis Date  . Anemia    iron infusions x 2  . Anxiety   . Arthritis    Neck, left shoulder  . Asthma    uses inhaler 2-3 x week  . Bipolar disorder ([place])   . Depression   . Endometriosis   . Fibroids   . GERD (gastroesophageal reflux disease)   . Headache    Migraines  . Hepatitis 2009   Hep C  . HIV infection ([place])   . Pancreatitis   . Seasonal allergies   . SVD (spontaneous vaginal delivery)    x 1   Family History  Problem Relation Age of Onset  . Breast cancer Paternal Grandmother   . Breast cancer Cousin    Past Surgical History:  Procedure Laterality Date  . CESAREAN SECTION     x2  . DILATION AND CURETTAGE OF UTERUS     x 2 - TAB  . HAND SURGERY Right    2 pins in wrist  . HYSTEROSCOPY N/A 10/11/2016   Procedure: HYSTEROSCOPY WITH HYDROTHERMAL ABLATION;  Surgeon: Donnamae Jude, MD;  Location: [facility];  Service: Gynecology;  Laterality: N/A;  Caryl Pina the HTA rep will be here.  Confirmed on 09/02/16.   Marland Kitchen LAPAROSCOPIC TUBAL LIGATION Bilateral 10/11/2016   Procedure: LAPAROSCOPIC TUBAL LIGATION WITH FILSHIE CLIPS;  Surgeon: Donnamae Jude, MD;  Location: [facility];  Service: Gynecology;  Laterality: Bilateral;  . MOUTH SURGERY    . WISDOM TOOTH EXTRACTION     Patient Active Problem List   Diagnosis Date Noted  . Iron deficiency anemia due to chronic blood loss 03/25/2014  . Abnormal uterine bleeding (AUB) 03/12/2014  . Bipolar affective disorder ([place]) 04/13/2012  . Endometriosis 04/13/2012  . Migraine headache 04/13/2012  . Hepatitis C infection 03/10/2011  . HIV disease ([place]) 03/10/2011      Prior to Admission medications   Medication Sig Start Date End Date Taking? Authorizing Provider  abacavir-dolutegravir-lamiVUDine (TRIUMEQ) 600-50-300 MG tablet Take 1 tablet by mouth daily. 06/01/18   Tsosie Billing, MD  albuterol (PROAIR HFA) 108 (90 Base) MCG/ACT inhaler Inhale 2 puffs into the lungs every 6 (six) hours as needed for wheezing or shortness of breath.    [provider]  amoxicillin-clavulanate (AUGMENTIN) 875-125 MG tablet Take 1 tablet by mouth 2 (two) times daily. Patient not taking: Reported on 06/01/2018 04/17/17   Sherrie George B, FNP  busPIRone (BUSPAR) 5 MG tablet Take 5 mg by mouth 2 (two) times daily.    [provider]  cetirizine (ZYRTEC) 10 MG tablet Take 10 mg by mouth daily as needed for allergies.  [provider]  ciprofloxacin (CILOXAN) 0.3 % ophthalmic solution Place 2 drops into the left ear every 4 (four) hours while awake. Patient not taking: Reported on 06/01/2018 04/17/17   Sherrie George B, FNP  fexofenadine-pseudoephedrine (ALLEGRA-D) 60-120 MG 12 hr tablet Take 1 tablet by mouth 2 (two) times daily. Patient not taking: Reported on 06/01/2018 04/11/17   Sable Feil, PA-C  fluticasone [facility]) 50 MCG/ACT nasal spray Place 1 spray into both nostrils daily as needed for allergies or rhinitis.    [provider]  hydrochlorothiazide (HYDRODIURIL) 25 MG tablet Take 1 tablet (25 mg total) by mouth daily for 7 days. 06/01/18 06/08/18  Tsosie Billing, MD  ketorolac  (TORADOL) 10 MG tablet Take 1 tablet (10 mg total) by mouth every 6 (six) hours as needed. Patient not taking: Reported on 06/01/2018 07/31/17   Lavonia Drafts, MD  meloxicam (MOBIC) 7.5 MG tablet Take 7.5 mg by mouth 2 (two) times daily.    [provider]  Norgestimate-Ethinyl Estradiol Triphasic 0.18/0.215/0.25 MG-25 MCG tab Take 3 tablets by mouth daily. 12/03/16 12/10/16  Rodell Perna A, PA-C  ondansetron (ZOFRAN) 4 MG tablet Take 1 tablet (4 mg total) by mouth every 8 (eight) hours as needed for nausea or vomiting. Patient not taking: Reported on 06/01/2018 12/03/16   Rodell Perna A, PA-C  oxyCODONE-acetaminophen (PERCOCET) 5-325 MG tablet Take 1-2 tablets every 8 (eight) hours as needed by mouth. Patient not taking: Reported on 06/01/2018 03/10/17   Earleen Newport, MD  pantoprazole (PROTONIX) 40 MG tablet Take 1 tablet (40 mg total) by mouth 2 (two) times daily. Patient not taking: Reported on 06/01/2018 10/21/16   Donnamae Jude, MD  predniSONE (DELTASONE) 50 MG tablet Take one tablet daily for the next five days. Patient not taking: Reported on 06/01/2018 04/05/17   Lannie Fields, PA-C  traMADol (ULTRAM) 50 MG tablet Take 1 tablet (50 mg total) by mouth every 6 (six) hours as needed for moderate pain. Patient not taking: Reported on 06/01/2018 04/11/17   Sable Feil, PA-C  Vitamin D, Ergocalciferol, (DRISDOL) 50000 units CAPS capsule Take 50,000 Units by mouth every 7 (seven) days.    [provider]    Allergies Naproxen sodium    Social History Social History   Tobacco Use  . Smoking status: Former Smoker    Packs/day: 0.25    Years: 25.00    Pack years: 6.25    Types: Cigarettes  . Smokeless tobacco: Never Used  . Tobacco comment: uses vapor occasionally  Substance Use Topics  . Alcohol use: Yes    Comment: wine occ  . Drug use: Yes    Types: Marijuana    Comment: 2-3 x per month    Review of Systems Patient denies headaches, rhinorrhea, blurry vision,  numbness, shortness of breath, chest pain, edema, cough, abdominal pain, nausea, vomiting, diarrhea, dysuria, fevers, rashes or hallucinations unless otherwise stated above in HPI. ____________________________________________   PHYSICAL EXAM:  VITAL SIGNS: Vitals:   06/12/18 2200 06/12/18 2300  BP: (!) 181/131 (!) 180/124  Pulse: 80 72  Resp:  18  Temp:    SpO2: 100% 100%    Constitutional: Alert and oriented.  Eyes: Conjunctivae are normal.  Head: Atraumatic. Nose: No congestion/rhinnorhea. Mouth/Throat: Mucous membranes are moist.   Neck: No stridor. Painless ROM.  Cardiovascular: Normal rate, regular rhythm. Grossly normal heart sounds.  Good peripheral circulation. Respiratory: Normal respiratory effort.  No retractions. Lungs CTAB. Gastrointestinal: Soft and nontender. No distention.  No abdominal bruits. No CVA tenderness. Genitourinary:  Musculoskeletal: No lower extremity tenderness nor edema.  No joint effusions. Neurologic:  Normal speech and language. No gross focal neurologic deficits are appreciated. No facial droop Skin:  Skin is warm, dry and intact. No rash noted. Psychiatric: Mood and affect are normal. Speech and behavior are normal.  ____________________________________________   LABS (all labs ordered are listed, but only abnormal results are displayed)  Results for orders placed or performed during the hospital encounter of 06/12/18 (from the past 24 hour(s))  Urinalysis, Complete w Microscopic     Status: Abnormal   Collection Time: 06/12/18  8:42 PM  Result Value Ref Range   Color, Urine RED (A) YELLOW   APPearance CLOUDY (A) CLEAR   Specific Gravity, Urine 1.027 1.005 - 1.030   pH 6.0 5.0 - 8.0   Glucose, UA NEGATIVE NEGATIVE mg/dL   Hgb urine dipstick LARGE (A) NEGATIVE   Bilirubin Urine NEGATIVE NEGATIVE   Ketones, ur NEGATIVE NEGATIVE mg/dL   Protein, ur 100 (A) NEGATIVE mg/dL   Nitrite NEGATIVE NEGATIVE   Leukocytes,Ua SMALL (A) NEGATIVE    RBC / HPF >50 (H) 0 - 5 RBC/hpf   WBC, UA 11-20 0 - 5 WBC/hpf   Bacteria, UA NONE SEEN NONE SEEN   Squamous Epithelial / LPF NONE SEEN 0 - 5  CBC     Status: None   Collection Time: 06/12/18  8:42 PM  Result Value Ref Range   WBC 4.9 4.0 - 10.5 K/uL   RBC 4.94 3.87 - 5.11 MIL/uL   Hemoglobin 12.9 12.0 - 15.0 g/dL   HCT 40.0 36.0 - 46.0 %   MCV 81.0 80.0 - 100.0 fL   MCH 26.1 26.0 - 34.0 pg   MCHC 32.3 30.0 - 36.0 g/dL   RDW 14.0 11.5 - 15.5 %   Platelets 197 150 - 400 K/uL   nRBC 0.0 0.0 - 0.2 %  Comprehensive metabolic panel     Status: Abnormal   Collection Time: 06/12/18  8:42 PM  Result Value Ref Range   Sodium 135 135 - 145 mmol/L   Potassium 3.6 3.5 - 5.1 mmol/L   Chloride 105 98 - 111 mmol/L   CO2 25 22 - 32 mmol/L   Glucose, Bld 109 (H) 70 - 99 mg/dL   BUN 9 6 - 20 mg/dL   Creatinine, Ser 0.82 0.44 - 1.00 mg/dL   Calcium 8.9 8.9 - 10.3 mg/dL   Total Protein 8.3 (H) 6.5 - 8.1 g/dL   Albumin 3.8 3.5 - 5.0 g/dL   AST 62 (H) 15 - 41 U/L   ALT 48 (H) 0 - 44 U/L   Alkaline Phosphatase 98 38 - 126 U/L   Total Bilirubin 0.8 0.3 - 1.2 mg/dL   GFR calc non Af Amer >60 >60 mL/min   GFR calc Af Amer >60 >60 mL/min   Anion gap 5 5 - 15  Pregnancy, urine POC     Status: None   Collection Time: 06/12/18  8:51 PM  Result Value Ref Range   Preg Test, Ur NEGATIVE NEGATIVE   ____________________________________________ ____________________________________________  RADIOLOGY  I personally reviewed all radiographic images ordered to evaluate for the above acute complaints and reviewed radiology reports and findings.  These findings were personally discussed with the patient.  Please see medical record for radiology report.  ____________________________________________   PROCEDURES  Procedure(s) performed:  Procedures    Critical Care performed: no ____________________________________________   INITIAL IMPRESSION /  ASSESSMENT AND PLAN / ED COURSE  Pertinent labs &  imaging results that were available during my care of the patient were reviewed by me and considered in my medical decision making (see chart for details).   DDX: menstrual cramp, endometriosis, fibroids, ectopic, torsion, cyst, uti  AARYN PARRILLA is a 48 y.o. who presents to the ED with abdominal pain that she feels is related to endometriosis.  Patient nontoxic-appearing but is uncomfortable.  Will give IV pain medication and reassess.  Patient is not pregnant.  Blood work is fairly reassuring.  Not consistent with appendicitis or acute infectious process particular given absence of leukocytosis or fever.  Denies any dysuria or discharge to suggest UTI, pyelonephritis or PID.  Will order ultrasound to evaluate for torsion or cyst.  Anticipate DC home if [place] reassuring for outpatient follow up.      As part of my medical decision making, I reviewed the following data within the [place] notes reviewed and incorporated, Labs reviewed, notes from prior ED visits and [place] Controlled Substance Database   ____________________________________________   FINAL CLINICAL IMPRESSION(S) / ED DIAGNOSES  Final diagnoses:  Pelvic pain  Endometriosis      NEW MEDICATIONS STARTED DURING THIS VISIT:  New Prescriptions   No medications on file     Note:  This document was prepared using Dragon voice recognition software and may include unintentional dictation errors.    Merlyn Lot, MD 06/12/18 2352

## 2018-06-12 NOTE — ED Notes (Signed)
Patient transported to Ultrasound 

## 2018-06-12 NOTE — Discharge Instructions (Signed)

## 2018-06-12 NOTE — ED Triage Notes (Signed)
Pt arrived to the ED for complaints of abdominal pain/cramping secondary to possible endometriosis. Pt reports that she has a history of endometriosis and that this pain is very similar. Pt states that the pain is not relieved by over the counter medication. Pt is AOx4 in moderate pain during triage.

## 2018-06-13 LAB — GENOSURE PRIME (GSPRIL)

## 2018-06-13 MED ORDER — HYDROCODONE-ACETAMINOPHEN 5-325 MG PO TABS: 1.0000 | ORAL_TABLET | ORAL | 0 refills | Status: DC | PRN

## 2018-06-13 NOTE — ED Provider Notes (Signed)
-----------------------------------------   1:28 AM on 06/13/2018 -----------------------------------------  I took over care of this patient from Dr. Quentin Cornwall.  At the time of signout she was pending an ultrasound.  Ultrasound shows left ovarian cyst with no evidence of torsion and no other acute abnormalities.  Patient has a known history of this ovarian cyst.  I suspect that her pain is most likely related to endometriosis.  At this time, the patient is comfortable and feels well to go home.  She is stable for discharge.  Return precautions given, and she expressed understanding.   Arta Silence, MD 06/13/18 [phone]

## 2018-06-22 ENCOUNTER — Ambulatory Visit: Payer: Medicaid Other | Admitting: Infectious Diseases

## 2018-06-30 ENCOUNTER — Ambulatory Visit: Payer: Self-pay | Admitting: Surgery

## 2018-06-30 NOTE — H&P (Signed)
Subjective:   CC: Incisional hernia, without obstruction or gangrene [K43.2]  HPI:  Teresa [name] is a 48 y.o. female who was referred by Michelle Nasuti, MD for evaluation of above. Symptoms were first noted several weeks ago. Pain is intermittent and discomfort, confined to the umbilical area, without radiation.  Associated with lump, exacerbated by nothing specific.  Lump is reducible. Patient has no symptoms of  difficulty urinating.    Past Medical History:  has a past medical history of Abdominal pain, right lower quadrant, Abnormal uterine bleeding, Allergic rhinitis due to other allergen, Allergy (06/28/2007), Anemia, Anxiety (05/2012), Arthritis (09/2015), Bipolar disorder, unspecified (CMS-HCC) (08/01/2009), Clotting disorder (CMS-HCC) (2005), Depression, Dysmenorrhea, Endometriosis of uterus (10/12/2010), Excessive or frequent menstruation, Fibroid (5638), Follicular cyst of ovary, GERD (gastroesophageal reflux disease) (10/29/2016), Hepatitis C carrier (CMS-HCC) (09/08/2011), HIV infection (CMS-HCC), Human immunodeficiency virus (HIV) disease (CMS-HCC) (08/01/2009), Hypertension (2018), Migraine, unspecified, without mention of intractable migraine without mention of status migrainosus (07/14/2009), Pancreatitis, and Unspecified contraceptive management.  Past Surgical History:       Past Surgical History:  Procedure Laterality Date  . [place] 2010 2018  . CESAREAN DELIVERY    . CESAREAN SECTION     x3  . DILATION AND CURETTAGE OF UTERUS     x2 with TAB  . ENDOMETRIAL ABLATION  2018  . hand surgery Right   . HERNIA REPAIR  09/2016  . TUBAL LIGATION  2018    Family History: family history includes Diabetes type II in her mother; High blood pressure (Hypertension) in her mother; Lung cancer in her father and maternal grandmother; Stomach cancer in her paternal grandmother.  Social History:  reports that she quit smoking about 20 months ago. Her  smoking use included cigarettes. She started smoking about 26 years ago. She has a 10.00 pack-year smoking history. She has never used smokeless tobacco. She reports current alcohol use. She reports current drug use. Drug: Marijuana.  Current Medications: has a current medication list which includes the following prescription(s): abacavir-dolutegravir-lamivudine, acetaminophen, bupropion, diphenhydramine, etodolac, hydrochlorothiazide, loratadine, and metronidazole.  Allergies:       Allergies as of 06/27/2018 - Reviewed 06/27/2018  Allergen Reaction Noted  . Naprosyn [naproxen] Hives     ROS:  A 15 point review of systems was performed and pertinent positives and negatives noted in HPI   Objective:   BP (!) 150/98   Pulse 81   Ht 170.2 cm (5\' 7" )   Wt 77.6 kg (171 lb 1.2 oz)   LMP 06/10/2018 (Exact Date)   BMI 26.79 kg/m   Constitutional :  alert, appears stated age, cooperative and no distress  Lymphatics/Throat:  no asymmetry, masses, or scars  Respiratory:  clear to auscultation bilaterally  Cardiovascular:  regular rate and rhythm  Gastrointestinal: soft, non-tender; bowel sounds normal; no masses,  no organomegaly. umbilical hernia noted.  moderate, reducible  Musculoskeletal: Steady gait and movement  Skin: Cool and moist, visible surgical scars infraumbilical  Psychiatric: Normal affect, non-agitated, not confused       LABS:  N/a   RADS: n/a Assessment:       Incisional hernia, without obstruction or gangrene [K43.2]  Plan:   1. Incisional hernia, without obstruction or gangrene [K43.2]   Discussed the risk of surgery including recurrence, which can be up to 50% in the case of incisional or complex hernias, possible use of prosthetic materials (mesh) and the increased risk of mesh infxn if used, bleeding, chronic pain, post-op  infxn, post-op SBO or ileus, and possible re-operation to address said risks. The risks of general anesthetic, if used,  includes MI, CVA, sudden death or even reaction to anesthetic medications also discussed. Alternatives include continued observation.  Benefits include possible symptom relief, prevention of incarceration, strangulation, enlargement in size over time, and the risk of emergency surgery in the face of strangulation.   Typical post-op recovery time of 3-5 days with 4-6 weeks of activity restrictions were also discussed.  ED return precautions given for sudden increase in pain, size of hernia with accompanying fever, nausea, and/or vomiting.  The patient verbalized understanding and all questions were answered to the patient's satisfaction.   2. Plan for joint case with Dr. Leonides Schanz hysterectomy.  Discussed with patient umbilical hernia repair likely will be laparoscopic with mesh.  Will coordinate date.      Electronically signed by Benjamine Sprague, DO on 06/27/2018 9:58 AM

## 2018-07-05 ENCOUNTER — Emergency Department
Admission: EM | Admit: 2018-07-05 | Discharge: 2018-07-05 | Disposition: A | Discharge status: Home / Self Care | Payer: BLUE CROSS/BLUE SHIELD | Attending: Emergency Medicine | Admitting: Emergency Medicine

## 2018-07-05 ENCOUNTER — Other Ambulatory Visit: Payer: Self-pay

## 2018-07-05 ENCOUNTER — Encounter: Payer: Self-pay | Admitting: Emergency Medicine

## 2018-07-05 DIAGNOSIS — J45909 Unspecified asthma, uncomplicated: Secondary | ICD-10-CM | POA: Diagnosis not present

## 2018-07-05 DIAGNOSIS — Z87891 Personal history of nicotine dependence: Secondary | ICD-10-CM | POA: Diagnosis not present

## 2018-07-05 DIAGNOSIS — R102 Pelvic and perineal pain: Secondary | ICD-10-CM

## 2018-07-05 DIAGNOSIS — B20 Human immunodeficiency virus [HIV] disease: Secondary | ICD-10-CM | POA: Insufficient documentation

## 2018-07-05 LAB — COMPREHENSIVE METABOLIC PANEL
ALT: 38 U/L (ref 0–44)
AST: 49 U/L — ABNORMAL HIGH (ref 15–41)
Albumin: 3.7 g/dL (ref 3.5–5.0)
Alkaline Phosphatase: 88 U/L (ref 38–126)
Anion gap: 9 (ref 5–15)
BUN: 9 mg/dL (ref 6–20)
CO2: 24 mmol/L (ref 22–32)
Calcium: 8.7 mg/dL — ABNORMAL LOW (ref 8.9–10.3)
Chloride: 105 mmol/L (ref 98–111)
Creatinine, Ser: 0.83 mg/dL (ref 0.44–1.00)
GFR calc Af Amer: >60 mL/min (ref >60)
GFR calc non Af Amer: >60 mL/min (ref >60)
Glucose, Bld: 118 mg/dL — ABNORMAL HIGH (ref 70–99)
Potassium: 3.5 mmol/L (ref 3.5–5.1)
Sodium: 138 mmol/L (ref 135–145)
Total Bilirubin: 0.9 mg/dL (ref 0.3–1.2)
Total Protein: 7.9 g/dL (ref 6.5–8.1)

## 2018-07-05 LAB — CBC
HCT: 36.8 % (ref 36.0–46.0)
Hemoglobin: 12.1 g/dL (ref 12.0–15.0)
MCH: 26.8 pg (ref 26.0–34.0)
MCHC: 32.9 g/dL (ref 30.0–36.0)
MCV: 81.6 fL (ref 80.0–100.0)
Platelets: 189 10*3/uL (ref 150–400)
RBC: 4.51 MIL/uL (ref 3.87–5.11)
RDW: 15 % (ref 11.5–15.5)
WBC: 4 10*3/uL (ref 4.0–10.5)
nRBC: 0 % (ref 0.0–0.2)

## 2018-07-05 MED ORDER — OXYCODONE-ACETAMINOPHEN 5-325 MG PO TABS
2.0000 | ORAL_TABLET | Freq: Once | ORAL | Status: AC
  Administered 2018-07-05: 2 via ORAL
  Filled 2018-07-05: qty 2

## 2018-07-05 MED ORDER — HYDROMORPHONE HCL 1 MG/ML IJ SOLN
1.0000 mg | Freq: Once | INTRAMUSCULAR | Status: AC
  Administered 2018-07-05: 1 mg via INTRAMUSCULAR
  Filled 2018-07-05: qty 1

## 2018-07-05 MED ORDER — ONDANSETRON 4 MG PO TBDP
4.0000 mg | ORAL_TABLET | Freq: Once | ORAL | Status: AC
  Administered 2018-07-05: 4 mg via ORAL
  Filled 2018-07-05: qty 1

## 2018-07-05 MED ORDER — OXYCODONE-ACETAMINOPHEN 5-325 MG PO TABS: 1.0000 | ORAL_TABLET | Freq: Three times a day (TID) | ORAL | 0 refills | Status: DC | PRN

## 2018-07-05 MED ORDER — PROMETHAZINE HCL 25 MG PO TABS: 25.0000 mg | ORAL_TABLET | Freq: Four times a day (QID) | ORAL | 0 refills | Status: DC | PRN

## 2018-07-05 NOTE — ED Provider Notes (Signed)
[facility] Emergency Department Provider Note       Time seen: ----------------------------------------- 3:08 PM on 07/05/2018 -----------------------------------------   I have reviewed the triage vital signs and the nursing notes.  HISTORY   Chief Complaint Abdominal Pain    HPI Teresa [name] is a 48 y.o. female with a history of anemia, anxiety, asthma, depression, GERD, migraines, HIV, pancreatitis who presents to the ED for right lower quadrant pain.  Patient states she is currently on her menstrual cycle and is scheduled for hysterectomy next week due to severe endometriosis.  She also states she is passing large clots.  She is taking some pain medicine without any improvement for her pelvic pain.  Past Medical History:  Diagnosis Date  . Anemia    iron infusions x 2  . Anxiety   . Arthritis    Neck, left shoulder  . Asthma    uses inhaler 2-3 x week  . Bipolar disorder ([place])   . Depression   . Endometriosis   . Fibroids   . GERD (gastroesophageal reflux disease)   . Headache    Migraines  . Hepatitis 2009   Hep C  . HIV infection ([place])   . Pancreatitis   . Seasonal allergies   . SVD (spontaneous vaginal delivery)    x 1    Patient Active Problem List   Diagnosis Date Noted  . Iron deficiency anemia due to chronic blood loss 03/25/2014  . Abnormal uterine bleeding (AUB) 03/12/2014  . Bipolar affective disorder ([place]) 04/13/2012  . Endometriosis 04/13/2012  . Migraine headache 04/13/2012  . Hepatitis C infection 03/10/2011  . HIV disease ([place]) 03/10/2011    Past Surgical History:  Procedure Laterality Date  . CESAREAN SECTION     x2  . DILATION AND CURETTAGE OF UTERUS     x 2 - TAB  . HAND SURGERY Right    2 pins in wrist  . HYSTEROSCOPY N/A 10/11/2016   Procedure: HYSTEROSCOPY WITH HYDROTHERMAL ABLATION;  Surgeon: Donnamae Jude, MD;  Location: [facility];  Service: Gynecology;  Laterality: N/A;  Caryl Pina the [employer] rep will be  here.  Confirmed on 09/02/16.   Marland Kitchen LAPAROSCOPIC TUBAL LIGATION Bilateral 10/11/2016   Procedure: LAPAROSCOPIC TUBAL LIGATION WITH FILSHIE CLIPS;  Surgeon: Donnamae Jude, MD;  Location: [facility];  Service: Gynecology;  Laterality: Bilateral;  . MOUTH SURGERY    . WISDOM TOOTH EXTRACTION      Allergies Naproxen sodium  Social History Social History   Tobacco Use  . Smoking status: Former Smoker    Packs/day: 0.25    Years: 25.00    Pack years: 6.25    Types: Cigarettes  . Smokeless tobacco: Never Used  . Tobacco comment: uses vapor occasionally  Substance Use Topics  . Alcohol use: Yes    Comment: wine occ  . Drug use: Yes    Types: Marijuana    Comment: 2-3 x per month   Review of Systems Constitutional: Negative for fever. Cardiovascular: Negative for chest pain. Respiratory: Negative for shortness of breath. Gastrointestinal: Positive for abdominal pain Genitourinary: Positive for vaginal bleeding Musculoskeletal: Negative for back pain. Skin: Negative for rash. Neurological: Negative for headaches, focal weakness or numbness.  All systems negative/normal/unremarkable except as stated in the HPI  ____________________________________________   PHYSICAL EXAM:  VITAL SIGNS: ED Triage Vitals  Enc Vitals Group     BP 07/05/18 1427 (!) 162/104     Pulse Rate 07/05/18 1427 75  Resp 07/05/18 1427 16     Temp 07/05/18 1427 97.9 F (36.6 C)     Temp Source 07/05/18 1427 Oral     SpO2 07/05/18 1427 100 %     Weight 07/05/18 1428 175 lb 14.8 oz (79.8 kg)     Height 07/05/18 1428 5\' 7"  (1.702 m)     Head Circumference --      Peak Flow --      Pain Score 07/05/18 1428 10     Pain Loc --      Pain Edu? --      Excl. in [place]? --    Constitutional: Alert and oriented.  Mild distress from pain Eyes: Conjunctivae are normal. Normal extraocular movements. ENT      Head: Normocephalic and atraumatic.      Nose: No congestion/rhinnorhea.      Mouth/Throat: Mucous  membranes are moist.      Neck: No stridor. Cardiovascular: Normal rate, regular rhythm. No murmurs, rubs, or gallops. Respiratory: Normal respiratory effort without tachypnea nor retractions. Breath sounds are clear and equal bilaterally. No wheezes/rales/rhonchi. Gastrointestinal: Mild lower abdominal tenderness, no rebound or guarding.  Normal bowel sounds. Musculoskeletal: Nontender with normal range of motion in extremities. No lower extremity tenderness nor edema. Neurologic:  Normal speech and language. No gross focal neurologic deficits are appreciated.  Skin:  Skin is warm, dry and intact. No rash noted. Psychiatric: Mood and affect are normal. Speech and behavior are normal.  ___________________________________________  ED COURSE:  As part of my medical decision making, I reviewed the following data within the [place] History obtained from family if available, nursing notes, old chart and ekg, as well as notes from prior ED visits. Patient presented for lower abdominal pain with vaginal bleeding, we will assess with labs and imaging as indicated at this time.   Procedures ____________________________________________   LABS (pertinent positives/negatives)  Labs Reviewed  COMPREHENSIVE METABOLIC PANEL - Abnormal; Notable for the following components:      Result Value   Glucose, Bld 118 (*)    Calcium 8.7 (*)    AST 49 (*)    All other components within normal limits  CBC  URINALYSIS, COMPLETE (UACMP) WITH MICROSCOPIC  POC URINE PREG, ED   ___________________________________________   DIFFERENTIAL DIAGNOSIS   Ovarian cyst, endometriosis, dysmenorrhea  FINAL ASSESSMENT AND PLAN  Pelvic pain   Plan: The patient had presented for persistent pelvic pain. Patient's labs did not reveal any acute process.  After discussion with the patient we have decided to withhold any further imaging due to her history.  She has close outpatient follow-up for  hysterectomy in the next week.  She will be discharged with pain medicine and antiemetics.   Laurence Aly, MD    Note: This note was generated in part or whole with voice recognition software. Voice recognition is usually quite accurate but there are transcription errors that can and very often do occur. I apologize for any typographical errors that were not detected and corrected.     Earleen Newport, MD 07/05/18 [phone]

## 2018-07-05 NOTE — ED Notes (Signed)
Pt has a ride home waiting in ED lobby.

## 2018-07-05 NOTE — ED Triage Notes (Signed)
Says right lower quad pain.  Says she has endometriosis and is scheduled for hysterectomy next week, but the pain is unbearable.  Also says passing large clots.

## 2018-07-06 ENCOUNTER — Encounter
Admission: RE | Admit: 2018-07-06 | Discharge: 2018-07-06 | Disposition: A | Discharge status: Home / Self Care | Payer: Medicaid Other | Source: Ambulatory Visit | Attending: Obstetrics & Gynecology | Admitting: Obstetrics & Gynecology

## 2018-07-06 ENCOUNTER — Other Ambulatory Visit: Payer: Self-pay

## 2018-07-06 DIAGNOSIS — Z01812 Encounter for preprocedural laboratory examination: Secondary | ICD-10-CM | POA: Diagnosis present

## 2018-07-06 LAB — TYPE AND SCREEN
ABO/RH(D): O POS
Antibody Screen: NEGATIVE

## 2018-07-06 NOTE — Patient Instructions (Signed)
Your procedure is scheduled on: July 11, 2018 TUESDAY Report to Day Surgery on the 2nd floor of the [facility]. To find out your arrival time, please call [phone] between 1PM - 3PM on: Monday July 10, 2018   REMEMBER: Instructions that are not followed completely may result in serious medical risk, up to and including death; or upon the discretion of your surgeon and anesthesiologist your surgery may need to be rescheduled.  Do not eat food after midnight the night before surgery.  No gum chewing, lozengers or hard candies.  You may however, drink CLEAR liquids up to 2 hours before you are scheduled to arrive for your surgery. Do not drink anything within 2 hours of the start of your surgery.  Clear liquids include: - water  - apple juice without pulp - black coffee or tea (Do NOT add milk or creamers to the coffee or tea) Do NOT drink anything that is not on this list.  Type 1 and Type 2 diabetics should only drink water.  ENSURE PRE-SURGERY CARBOHYDRATE DRINK: 3 hours before arrival to the hospital.  No Alcohol for 24 hours before or after surgery.  No Smoking including e-cigarettes for 24 hours prior to surgery.  No chewable tobacco products for at least 6 hours prior to surgery.  No nicotine patches on the day of surgery.  On the morning of surgery brush your teeth with toothpaste and water, you may rinse your mouth with mouthwash if you wish. Do not swallow any toothpaste or mouthwash.  Notify your doctor if there is any change in your medical condition (cold, fever, infection).  Do not wear jewelry, make-up, hairpins, clips or nail polish.  Do not wear lotions, powders, or perfumes.   Do not shave 48 hours prior to surgery.   Contacts and dentures may not be worn into surgery.  Do not bring valuables to the hospital, including drivers license, insurance or credit cards.  [facility] is not responsible for any belongings or valuables.   TAKE THESE  MEDICATIONS THE MORNING OF SURGERY: TRIUMEG BUPROPION PAIN PILL IF NEEDED   Use CHG Soap  as directed on instruction sheet.   Use inhalers on the day of surgery   Stop Anti-inflammatories (NSAIDS) such as Advil, Aleve, Ibuprofen, Motrin,  Naprosyn and Aspirin based products such as Excedrin, Goodys Powder, BC Powder. (May take Tylenol or Acetaminophen if needed.)  Stop ANY OVER THE COUNTER supplements until after surgery. (May continue Vitamin D, Vitamin B, and multivitamin.)  Wear comfortable clothing (specific to your surgery type) to the hospital.  Plan for stool softeners for home use.  If you are being admitted to the hospital overnight, leave your suitcase in the car. After surgery it may be brought to your room.  If you are being discharged the day of surgery, you will not be allowed to drive home. You will need a responsible adult to drive you home and stay with you that night.   If you are taking public transportation, you will need to have a responsible adult with you. Please confirm with your physician that it is acceptable to use public transportation.   Please call [phone] if you have any questions about these instructions.

## 2018-07-10 MED ORDER — CEFAZOLIN SODIUM-DEXTROSE 2-4 GM/100ML-% IV SOLN
2.0000 g | INTRAVENOUS | Status: AC
  Administered 2018-07-11: 2 g via INTRAVENOUS

## 2018-07-11 ENCOUNTER — Ambulatory Visit: Payer: Medicaid Other | Admitting: Certified Registered"

## 2018-07-11 ENCOUNTER — Other Ambulatory Visit: Payer: Self-pay

## 2018-07-11 ENCOUNTER — Ambulatory Visit
Admission: RE | Admit: 2018-07-11 | Discharge: 2018-07-11 | Disposition: A | Discharge status: Home / Self Care | Payer: Medicaid Other | Attending: Obstetrics & Gynecology | Admitting: Obstetrics & Gynecology

## 2018-07-11 ENCOUNTER — Encounter
Admission: RE | Disposition: A | Discharge status: Home / Self Care | Payer: Self-pay | Source: Home / Self Care | Attending: Obstetrics & Gynecology

## 2018-07-11 DIAGNOSIS — R102 Pelvic and perineal pain: Secondary | ICD-10-CM | POA: Insufficient documentation

## 2018-07-11 DIAGNOSIS — K219 Gastro-esophageal reflux disease without esophagitis: Secondary | ICD-10-CM | POA: Insufficient documentation

## 2018-07-11 DIAGNOSIS — Z87891 Personal history of nicotine dependence: Secondary | ICD-10-CM | POA: Insufficient documentation

## 2018-07-11 DIAGNOSIS — D259 Leiomyoma of uterus, unspecified: Secondary | ICD-10-CM | POA: Insufficient documentation

## 2018-07-11 DIAGNOSIS — F419 Anxiety disorder, unspecified: Secondary | ICD-10-CM | POA: Diagnosis not present

## 2018-07-11 DIAGNOSIS — K432 Incisional hernia without obstruction or gangrene: Secondary | ICD-10-CM | POA: Diagnosis not present

## 2018-07-11 DIAGNOSIS — N838 Other noninflammatory disorders of ovary, fallopian tube and broad ligament: Secondary | ICD-10-CM | POA: Diagnosis not present

## 2018-07-11 DIAGNOSIS — Z21 Asymptomatic human immunodeficiency virus [HIV] infection status: Secondary | ICD-10-CM | POA: Insufficient documentation

## 2018-07-11 DIAGNOSIS — N800 Endometriosis of uterus: Secondary | ICD-10-CM | POA: Insufficient documentation

## 2018-07-11 DIAGNOSIS — I10 Essential (primary) hypertension: Secondary | ICD-10-CM | POA: Insufficient documentation

## 2018-07-11 DIAGNOSIS — Z79899 Other long term (current) drug therapy: Secondary | ICD-10-CM | POA: Diagnosis not present

## 2018-07-11 DIAGNOSIS — F329 Major depressive disorder, single episode, unspecified: Secondary | ICD-10-CM | POA: Diagnosis not present

## 2018-07-11 DIAGNOSIS — K660 Peritoneal adhesions (postprocedural) (postinfection): Secondary | ICD-10-CM | POA: Insufficient documentation

## 2018-07-11 DIAGNOSIS — G8929 Other chronic pain: Secondary | ICD-10-CM | POA: Insufficient documentation

## 2018-07-11 HISTORY — PX: LAPAROSCOPIC BILATERAL SALPINGECTOMY: SHX5889

## 2018-07-11 HISTORY — PX: UMBILICAL HERNIA REPAIR: SHX196

## 2018-07-11 HISTORY — PX: LAPAROSCOPIC HYSTERECTOMY: SHX1926

## 2018-07-11 LAB — URINE DRUG SCREEN, QUALITATIVE (ARMC ONLY)
Amphetamines, Ur Screen: NOT DETECTED
Barbiturates, Ur Screen: NOT DETECTED
Benzodiazepine, Ur Scrn: NOT DETECTED
Cannabinoid 50 Ng, Ur ~~LOC~~: POSITIVE — AB
Cocaine Metabolite,Ur ~~LOC~~: NOT DETECTED
MDMA (Ecstasy)Ur Screen: NOT DETECTED
Methadone Scn, Ur: NOT DETECTED
Opiate, Ur Screen: NOT DETECTED
Phencyclidine (PCP) Ur S: NOT DETECTED
Tricyclic, Ur Screen: NOT DETECTED

## 2018-07-11 LAB — ABO/RH: ABO/RH(D): O POS

## 2018-07-11 LAB — POCT PREGNANCY, URINE: Preg Test, Ur: NEGATIVE

## 2018-07-11 SURGERY — HYSTERECTOMY, TOTAL, LAPAROSCOPIC
Anesthesia: General

## 2018-07-11 SURGERY — REPAIR, HERNIA, UMBILICAL, LAPAROSCOPIC
Anesthesia: General

## 2018-07-11 MED ORDER — FENTANYL CITRATE (PF) 100 MCG/2ML IJ SOLN
INTRAMUSCULAR | Status: DC | PRN
  Administered 2018-07-11 (×2): 100 ug via INTRAVENOUS

## 2018-07-11 MED ORDER — DEXAMETHASONE SODIUM PHOSPHATE 4 MG/ML IJ SOLN
4.0000 mg | INTRAMUSCULAR | Status: AC
  Administered 2018-07-11: 4 mg via INTRAVENOUS
  Filled 2018-07-11: qty 1

## 2018-07-11 MED ORDER — KETAMINE HCL 50 MG/ML IJ SOLN
INTRAMUSCULAR | Status: DC | PRN
  Administered 2018-07-11: 20 mg via INTRAMUSCULAR

## 2018-07-11 MED ORDER — IBUPROFEN 800 MG PO TABS: 800.0000 mg | ORAL_TABLET | Freq: Four times a day (QID) | ORAL | 0 refills | Status: DC

## 2018-07-11 MED ORDER — OXYCODONE HCL 5 MG PO TABS
5.0000 mg | ORAL_TABLET | ORAL | Status: DC | PRN
  Administered 2018-07-11: 5 mg via ORAL

## 2018-07-11 MED ORDER — FENTANYL CITRATE (PF) 100 MCG/2ML IJ SOLN: 25.0000 ug | INTRAMUSCULAR | Status: DC | PRN

## 2018-07-11 MED ORDER — LIDOCAINE HCL (CARDIAC) PF 100 MG/5ML IV SOSY
PREFILLED_SYRINGE | INTRAVENOUS | Status: DC | PRN
  Administered 2018-07-11: 80 mg via INTRAVENOUS

## 2018-07-11 MED ORDER — ROCURONIUM BROMIDE 50 MG/5ML IV SOLN
INTRAVENOUS | Status: AC
  Filled 2018-07-11: qty 5

## 2018-07-11 MED ORDER — TRIAMCINOLONE ACETONIDE 40 MG/ML IJ SUSP
INTRAMUSCULAR | Status: AC
  Filled 2018-07-11: qty 1

## 2018-07-11 MED ORDER — GABAPENTIN 300 MG PO CAPS
600.0000 mg | ORAL_CAPSULE | ORAL | Status: AC
  Administered 2018-07-11: 600 mg via ORAL

## 2018-07-11 MED ORDER — SCOPOLAMINE 1 MG/3DAYS TD PT72
MEDICATED_PATCH | TRANSDERMAL | Status: AC
  Administered 2018-07-11: 1.5 mg via TRANSDERMAL
  Filled 2018-07-11: qty 1

## 2018-07-11 MED ORDER — SUGAMMADEX SODIUM 200 MG/2ML IV SOLN
INTRAVENOUS | Status: AC
  Filled 2018-07-11: qty 2

## 2018-07-11 MED ORDER — LIDOCAINE HCL (PF) 2 % IJ SOLN
INTRAMUSCULAR | Status: AC
  Filled 2018-07-11: qty 20

## 2018-07-11 MED ORDER — SCOPOLAMINE 1 MG/3DAYS TD PT72
1.0000 | MEDICATED_PATCH | TRANSDERMAL | Status: DC
  Administered 2018-07-11: 1.5 mg via TRANSDERMAL

## 2018-07-11 MED ORDER — FAMOTIDINE 20 MG PO TABS
ORAL_TABLET | ORAL | Status: AC
  Administered 2018-07-11: 20 mg via ORAL
  Filled 2018-07-11: qty 1

## 2018-07-11 MED ORDER — ACETAMINOPHEN 500 MG PO TABS
ORAL_TABLET | ORAL | Status: AC
  Administered 2018-07-11: 1000 mg via ORAL
  Filled 2018-07-11: qty 2

## 2018-07-11 MED ORDER — SUGAMMADEX SODIUM 500 MG/5ML IV SOLN
INTRAVENOUS | Status: AC
  Filled 2018-07-11: qty 10

## 2018-07-11 MED ORDER — MIDAZOLAM HCL 2 MG/2ML IJ SOLN
INTRAMUSCULAR | Status: DC | PRN
  Administered 2018-07-11: 2 mg via INTRAVENOUS

## 2018-07-11 MED ORDER — ESMOLOL HCL 100 MG/10ML IV SOLN
INTRAVENOUS | Status: AC
  Filled 2018-07-11: qty 20

## 2018-07-11 MED ORDER — HYDRALAZINE HCL 20 MG/ML IJ SOLN
INTRAMUSCULAR | Status: AC
  Filled 2018-07-11: qty 1

## 2018-07-11 MED ORDER — LABETALOL HCL 5 MG/ML IV SOLN
INTRAVENOUS | Status: AC
  Filled 2018-07-11: qty 4

## 2018-07-11 MED ORDER — SUCCINYLCHOLINE CHLORIDE 20 MG/ML IJ SOLN
INTRAMUSCULAR | Status: DC | PRN
  Administered 2018-07-11: 120 mg via INTRAVENOUS

## 2018-07-11 MED ORDER — PHENYLEPHRINE HCL 10 MG/ML IJ SOLN
INTRAMUSCULAR | Status: AC
  Filled 2018-07-11: qty 1

## 2018-07-11 MED ORDER — DEXAMETHASONE SODIUM PHOSPHATE 10 MG/ML IJ SOLN
INTRAMUSCULAR | Status: AC
  Filled 2018-07-11: qty 2

## 2018-07-11 MED ORDER — FENTANYL CITRATE (PF) 250 MCG/5ML IJ SOLN
INTRAMUSCULAR | Status: AC
  Filled 2018-07-11: qty 5

## 2018-07-11 MED ORDER — CELECOXIB 200 MG PO CAPS
ORAL_CAPSULE | ORAL | Status: AC
  Administered 2018-07-11: 400 mg via ORAL
  Filled 2018-07-11: qty 2

## 2018-07-11 MED ORDER — DEXMEDETOMIDINE HCL IN NACL 200 MCG/50ML IV SOLN
INTRAVENOUS | Status: AC
  Filled 2018-07-11: qty 50

## 2018-07-11 MED ORDER — MIDAZOLAM HCL 2 MG/2ML IJ SOLN
INTRAMUSCULAR | Status: AC
  Filled 2018-07-11: qty 2

## 2018-07-11 MED ORDER — HEPARIN SODIUM (PORCINE) 5000 UNIT/ML IJ SOLN
INTRAMUSCULAR | Status: AC
  Administered 2018-07-11: 5000 [IU] via SUBCUTANEOUS
  Filled 2018-07-11: qty 1

## 2018-07-11 MED ORDER — SUCCINYLCHOLINE CHLORIDE 20 MG/ML IJ SOLN
INTRAMUSCULAR | Status: AC
  Filled 2018-07-11: qty 2

## 2018-07-11 MED ORDER — ONDANSETRON HCL 4 MG/2ML IJ SOLN
INTRAMUSCULAR | Status: AC
  Filled 2018-07-11: qty 2

## 2018-07-11 MED ORDER — CEFAZOLIN SODIUM-DEXTROSE 2-4 GM/100ML-% IV SOLN
INTRAVENOUS | Status: AC
  Filled 2018-07-11: qty 100

## 2018-07-11 MED ORDER — ONDANSETRON HCL 4 MG/2ML IJ SOLN
INTRAMUSCULAR | Status: AC
  Filled 2018-07-11: qty 4

## 2018-07-11 MED ORDER — HYDROMORPHONE HCL 1 MG/ML IJ SOLN
INTRAMUSCULAR | Status: AC
  Filled 2018-07-11: qty 1

## 2018-07-11 MED ORDER — HYDROMORPHONE HCL 1 MG/ML IJ SOLN
INTRAMUSCULAR | Status: DC | PRN
  Administered 2018-07-11: 1 mg via INTRAVENOUS

## 2018-07-11 MED ORDER — ACETAMINOPHEN 500 MG PO TABS
1000.0000 mg | ORAL_TABLET | ORAL | Status: AC
  Administered 2018-07-11: 1000 mg via ORAL

## 2018-07-11 MED ORDER — PROPOFOL 10 MG/ML IV BOLUS
INTRAVENOUS | Status: AC
  Filled 2018-07-11: qty 20

## 2018-07-11 MED ORDER — CELECOXIB 200 MG PO CAPS
400.0000 mg | ORAL_CAPSULE | ORAL | Status: AC
  Administered 2018-07-11: 400 mg via ORAL

## 2018-07-11 MED ORDER — PROPOFOL 10 MG/ML IV BOLUS
INTRAVENOUS | Status: DC | PRN
  Administered 2018-07-11: 150 mg via INTRAVENOUS

## 2018-07-11 MED ORDER — GABAPENTIN 300 MG PO CAPS
ORAL_CAPSULE | ORAL | Status: AC
  Administered 2018-07-11: 600 mg via ORAL
  Filled 2018-07-11: qty 2

## 2018-07-11 MED ORDER — LABETALOL HCL 5 MG/ML IV SOLN
INTRAVENOUS | Status: DC | PRN
  Administered 2018-07-11 (×2): 5 mg via INTRAVENOUS
  Administered 2018-07-11: 10 mg via INTRAVENOUS
  Administered 2018-07-11 (×2): 5 mg via INTRAVENOUS

## 2018-07-11 MED ORDER — METOPROLOL TARTRATE 5 MG/5ML IV SOLN
INTRAVENOUS | Status: DC | PRN
  Administered 2018-07-11 (×2): 1 mg via INTRAVENOUS

## 2018-07-11 MED ORDER — OXYCODONE HCL 5 MG PO TABS: 5.0000 mg | ORAL_TABLET | ORAL | 0 refills | Status: DC | PRN

## 2018-07-11 MED ORDER — ONDANSETRON HCL 4 MG/2ML IJ SOLN
4.0000 mg | Freq: Once | INTRAMUSCULAR | Status: AC | PRN
  Administered 2018-07-11: 4 mg via INTRAVENOUS

## 2018-07-11 MED ORDER — GLYCOPYRROLATE 0.2 MG/ML IJ SOLN
INTRAMUSCULAR | Status: DC | PRN
  Administered 2018-07-11: 0.2 mg via INTRAVENOUS

## 2018-07-11 MED ORDER — GLYCOPYRROLATE 0.2 MG/ML IJ SOLN
INTRAMUSCULAR | Status: AC
  Filled 2018-07-11: qty 2

## 2018-07-11 MED ORDER — KETAMINE HCL 50 MG/ML IJ SOLN
INTRAMUSCULAR | Status: AC
  Filled 2018-07-11: qty 10

## 2018-07-11 MED ORDER — KETOROLAC TROMETHAMINE 30 MG/ML IJ SOLN
INTRAMUSCULAR | Status: AC
  Filled 2018-07-11: qty 1

## 2018-07-11 MED ORDER — DEXAMETHASONE SODIUM PHOSPHATE 10 MG/ML IJ SOLN
INTRAMUSCULAR | Status: AC
  Administered 2018-07-11: 10 mg
  Filled 2018-07-11: qty 1

## 2018-07-11 MED ORDER — ESMOLOL HCL 100 MG/10ML IV SOLN
INTRAVENOUS | Status: DC | PRN
  Administered 2018-07-11: 20 mg via INTRAVENOUS
  Administered 2018-07-11: 30 mg via INTRAVENOUS

## 2018-07-11 MED ORDER — HYDRALAZINE HCL 20 MG/ML IJ SOLN
INTRAMUSCULAR | Status: DC | PRN
  Administered 2018-07-11 (×2): 2 mg via INTRAVENOUS

## 2018-07-11 MED ORDER — EPHEDRINE SULFATE 50 MG/ML IJ SOLN
INTRAMUSCULAR | Status: AC
  Filled 2018-07-11: qty 2

## 2018-07-11 MED ORDER — LACTATED RINGERS IV SOLN
INTRAVENOUS | Status: DC
  Administered 2018-07-11 (×2): via INTRAVENOUS

## 2018-07-11 MED ORDER — ROCURONIUM BROMIDE 100 MG/10ML IV SOLN
INTRAVENOUS | Status: DC | PRN
  Administered 2018-07-11: 80 mg via INTRAVENOUS

## 2018-07-11 MED ORDER — DEXAMETHASONE SODIUM PHOSPHATE 10 MG/ML IJ SOLN
INTRAMUSCULAR | Status: DC | PRN
  Administered 2018-07-11: 10 mg via INTRAVENOUS

## 2018-07-11 MED ORDER — HEPARIN SODIUM (PORCINE) 5000 UNIT/ML IJ SOLN
5000.0000 [IU] | INTRAMUSCULAR | Status: AC
  Administered 2018-07-11: 5000 [IU] via SUBCUTANEOUS

## 2018-07-11 MED ORDER — FAMOTIDINE 20 MG PO TABS
20.0000 mg | ORAL_TABLET | Freq: Once | ORAL | Status: AC
  Administered 2018-07-11: 20 mg via ORAL

## 2018-07-11 MED ORDER — OXYCODONE HCL 5 MG PO TABS
ORAL_TABLET | ORAL | Status: AC
  Administered 2018-07-11: 5 mg via ORAL
  Filled 2018-07-11: qty 1

## 2018-07-11 MED ORDER — METOPROLOL TARTRATE 5 MG/5ML IV SOLN
INTRAVENOUS | Status: AC
  Filled 2018-07-11: qty 10

## 2018-07-11 MED ORDER — ONDANSETRON HCL 4 MG/2ML IJ SOLN
INTRAMUSCULAR | Status: DC | PRN
  Administered 2018-07-11: 4 mg via INTRAVENOUS

## 2018-07-11 MED ORDER — KETOROLAC TROMETHAMINE 30 MG/ML IJ SOLN
INTRAMUSCULAR | Status: DC | PRN
  Administered 2018-07-11: 30 mg via INTRAVENOUS

## 2018-07-11 MED ORDER — TRIAMCINOLONE ACETONIDE 40 MG/ML IJ SUSP
INTRAMUSCULAR | Status: DC | PRN
  Administered 2018-07-11: 80 mg via INTRAMUSCULAR

## 2018-07-11 SURGICAL SUPPLY — 85 items
BAG URINE DRAINAGE (UROLOGICAL SUPPLIES) ×3 IMPLANT
BLADE SURG 11 STRL SS SAFETY (MISCELLANEOUS) ×3 IMPLANT
BLADE SURG SZ11 CARB STEEL (BLADE) ×3 IMPLANT
CANISTER SUCT 1200ML W/VALVE (MISCELLANEOUS) ×6 IMPLANT
CANNULA DILATOR 10 W/SLV (CANNULA) ×3 IMPLANT
CANNULA DILATOR 5 W/SLV (CANNULA) ×3 IMPLANT
CATH FOLEY 2WAY  5CC 16FR (CATHETERS) ×1
CATH URTH 16FR FL 2W BLN LF (CATHETERS) ×2 IMPLANT
CHLORAPREP W/TINT 26 (MISCELLANEOUS) ×9 IMPLANT
COUNTER NEEDLE 20/40 LG (NEEDLE) ×3 IMPLANT
COVER WAND RF STERILE (DRAPES) IMPLANT
DEFOGGER SCOPE WARMER CLEARIFY (MISCELLANEOUS) ×6 IMPLANT
DERMABOND ADVANCED (GAUZE/BANDAGES/DRESSINGS) ×1
DERMABOND ADVANCED .7 DNX12 (GAUZE/BANDAGES/DRESSINGS) ×2 IMPLANT
DEVICE SUTURE ENDOST 10MM (ENDOMECHANICALS) IMPLANT
DRAPE LEGGINS SURG 28X43 STRL (DRAPES) ×3 IMPLANT
DRAPE SHEET LG 3/4 BI-LAMINATE (DRAPES) ×3 IMPLANT
DRAPE UNDER BUTTOCK W/FLU (DRAPES) ×3 IMPLANT
ELECT REM PT RETURN 9FT ADLT (ELECTROSURGICAL) ×6
ELECTRODE REM PT RTRN 9FT ADLT (ELECTROSURGICAL) ×4 IMPLANT
GAUZE SPONGE 4X4 12PLY STRL (GAUZE/BANDAGES/DRESSINGS) IMPLANT
GAUZE SPONGE 4X4 16PLY XRAY LF (GAUZE/BANDAGES/DRESSINGS) ×3 IMPLANT
GLOVE BIO SURGEON STRL SZ 6.5 (GLOVE) ×24 IMPLANT
GLOVE BIOGEL PI IND STRL 7.0 (GLOVE) ×20 IMPLANT
GLOVE BIOGEL PI INDICATOR 7.0 (GLOVE) ×10
GLOVE PI ORTHOPRO 6.5 (GLOVE) ×1
GLOVE PI ORTHOPRO STRL 6.5 (GLOVE) ×2 IMPLANT
GLOVE SURG SYN 6.5 ES PF (GLOVE) ×9 IMPLANT
GOWN STRL REUS W/ TWL LRG LVL3 (GOWN DISPOSABLE) ×8 IMPLANT
GOWN STRL REUS W/ TWL XL LVL3 (GOWN DISPOSABLE) ×2 IMPLANT
GOWN STRL REUS W/TWL LRG LVL3 (GOWN DISPOSABLE) ×4
GOWN STRL REUS W/TWL XL LVL3 (GOWN DISPOSABLE) ×1
GRASPER SUT TROCAR 14GX15 (MISCELLANEOUS) ×3 IMPLANT
HANDLE YANKAUER SUCT BULB TIP (MISCELLANEOUS) ×3 IMPLANT
IRRIGATION STRYKERFLOW (MISCELLANEOUS) IMPLANT
IRRIGATOR STRYKERFLOW (MISCELLANEOUS)
IV LACTATED RINGERS 1000ML (IV SOLUTION) ×3 IMPLANT
KIT PINK PAD W/HEAD ARE REST (MISCELLANEOUS) ×3
KIT PINK PAD W/HEAD ARM REST (MISCELLANEOUS) ×2 IMPLANT
KIT TURNOVER CYSTO (KITS) ×3 IMPLANT
KIT TURNOVER KIT A (KITS) ×3 IMPLANT
L-HOOK LAP DISP 36CM (ELECTROSURGICAL) ×3
LABEL OR SOLS (LABEL) ×3 IMPLANT
LHOOK LAP DISP 36CM (ELECTROSURGICAL) ×2 IMPLANT
LIGASURE VESSEL 5MM BLUNT TIP (ELECTROSURGICAL) ×3 IMPLANT
MANIPULATOR VCARE LG CRV RETR (MISCELLANEOUS) IMPLANT
MANIPULATOR VCARE SML CRV RETR (MISCELLANEOUS) IMPLANT
MANIPULATOR VCARE STD CRV RETR (MISCELLANEOUS) ×3 IMPLANT
NDL SAFETY ECLIPSE 18X1.5 (NEEDLE) IMPLANT
NEEDLE HYPO 18GX1.5 SHARP (NEEDLE)
NEEDLE HYPO 21X1.5 SAFETY (NEEDLE) IMPLANT
NEEDLE HYPO 22GX1.5 SAFETY (NEEDLE) ×3 IMPLANT
NEEDLE VERESS 14GA 120MM (NEEDLE) ×3 IMPLANT
NS IRRIG 500ML POUR BTL (IV SOLUTION) ×6 IMPLANT
PACK LAP CHOLECYSTECTOMY (MISCELLANEOUS) ×6 IMPLANT
PAD OB MATERNITY 4.3X12.25 (PERSONAL CARE ITEMS) ×3 IMPLANT
PAD PREP 24X41 OB/GYN DISP (PERSONAL CARE ITEMS) ×3 IMPLANT
PENCIL ELECTRO HAND CTR (MISCELLANEOUS) ×3 IMPLANT
SCISSORS METZENBAUM CVD 33 (INSTRUMENTS) ×3 IMPLANT
SCRUB CHG 4% DYNA-HEX 4OZ (MISCELLANEOUS) ×3 IMPLANT
SET CYSTO W/LG BORE CLAMP LF (SET/KITS/TRAYS/PACK) IMPLANT
SET TUBE SMOKE EVAC HIGH FLOW (TUBING) ×3 IMPLANT
SET YANKAUER POOLE SUCT (MISCELLANEOUS) ×3 IMPLANT
SLEEVE ENDOPATH XCEL 5M (ENDOMECHANICALS) ×9 IMPLANT
SURGILUBE 2OZ TUBE FLIPTOP (MISCELLANEOUS) ×3 IMPLANT
SUT ENDO VLOC 180-0-8IN (SUTURE) IMPLANT
SUT ETHIBOND 0 MO6 C/R (SUTURE) ×3 IMPLANT
SUT MNCRL 4-0 (SUTURE) ×3
SUT MNCRL 4-0 27XMFL (SUTURE) ×6
SUT MNCRL AB 4-0 PS2 18 (SUTURE) ×3 IMPLANT
SUT VIC AB 0 CT1 36 (SUTURE) ×6 IMPLANT
SUT VIC AB 2-0 SH 27 (SUTURE) ×1
SUT VIC AB 2-0 SH 27XBRD (SUTURE) ×2 IMPLANT
SUT VIC AB 3-0 SH 27 (SUTURE) ×1
SUT VIC AB 3-0 SH 27X BRD (SUTURE) ×2 IMPLANT
SUT VICRYL 0 AB UR-6 (SUTURE) ×3 IMPLANT
SUTURE MNCRL 4-0 27XMF (SUTURE) ×6 IMPLANT
SYR 10ML LL (SYRINGE) ×3 IMPLANT
SYR 3ML LL SCALE MARK (SYRINGE) ×3 IMPLANT
TACKER 5MM HERNIA 3.5CML NAB (ENDOMECHANICALS) IMPLANT
TOWEL OR 17X26 4PK STRL BLUE (TOWEL DISPOSABLE) ×3 IMPLANT
TROCAR XCEL BLUNT TIP 100MML (ENDOMECHANICALS) ×3 IMPLANT
TROCAR XCEL NON-BLD 11X100MML (ENDOMECHANICALS) ×3 IMPLANT
TROCAR XCEL NON-BLD 5MMX100MML (ENDOMECHANICALS) ×6 IMPLANT
TUBING EVAC SMOKE HEATED PNEUM (TUBING) ×3 IMPLANT

## 2018-07-11 NOTE — Op Note (Signed)
Total Laparoscopic Hysterectomy Operative Note Procedure Date: 07/11/2018  Patient:  Teresa [name]  48 y.o. female  PRE-OPERATIVE DIAGNOSIS:  failed endometrial ablation, fibroid uterus  POST-OPERATIVE DIAGNOSIS:  failed endometrial ablation, fibroid uterus  PROCEDURE:  Procedure(s): HYSTERECTOMY TOTAL LAPAROSCOPIC (N/A) LAPAROSCOPIC BILATERAL SALPINGECTOMY (Bilateral) LAPAROSCOPIC UMBILICAL HERNIA REPAIR (N/A)  SURGEON:  Surgeon(s) and Role: Panel 1:    * Harrold Fitchett, Honor Loh, MD - Primary    * Lysle Pearl, Isami, DO - Assisting Panel 2:    Benjamine Sprague, DO - Primary    * Doran Nestle, Honor Loh, MD - Assisting  ANESTHESIA:  General via ET  I/O  Total I/O In: 1100 [I.V.:1100] Out: 500 [Urine:350; Blood:150]  FINDINGS:  Large mobile uterus, normal ovaries and priviously ligated fallopian tubes bilaterally.  Posterior culdesac without endometriosis scarring.  Omental adhesions to anterior abdominal wall. Previously repaired umbilical hernia Keloid scar from previous surgery.  SPECIMEN: Uterus, Cervix, and bilateral fallopian tubes  COMPLICATIONS: none apparent  DISPOSITION: vital signs stable to PACU  Indication for Surgery: 48 y.o. [id number] who had had an endometrial ablation for menorrhagia and was still bleeding, with severe cramping pain and multiple visits to the ED, presented with request for definitive surgery to improve her symptoms.  She also had an umbilical hernia and requested repair by general surgery.  Risks of surgery were discussed with the patient including but not limited to: bleeding which may require transfusion or reoperation; infection which may require antibiotics; injury to bowel, bladder, ureters or other surrounding organs; need for additional procedures including laparotomy, blood clot, incisional problems and other postoperative/anesthesia complications. Written informed consent was obtained.     PROCEDURE IN DETAIL:  The patient had 5000u Heparin Sub-q and  sequential compression devices applied to her lower extremities while in the preoperative area.  She was then taken to the operating room. IV antibiotics were given. General anesthesia was administered via endotracheal route.  She was placed in the dorsal lithotomy position, and was prepped and draped in a sterile manner. A surgical time-out was performed.  A Foley catheter was inserted into her bladder and attached to constant drainage and a V-Care uterine manipulator was then advanced into the uterus and a good fit around the cervix was noted. The gloves were changed, and attention was turned to the abdomen where a palmer's point incision was made with the scalpel.  A 31mm trochar was inserted into the incision using a visiport method.Opening pressure was 57mmHg, and the abdomen was insufflated to 21mmHg carbon dioxide gas and adequate pneumoperitoneum was obtained. A survey of the patient's pelvis and abdomen revealed the findings as mentioned above. Two 81mm ports were inserted in the lower left and right quadrants under visualization.    The bilateral fallopian tubes were separated from the mesosalpinx using the Ligasure. The bilateral round ligaments were transected and anterior broad ligament divided and brought across the uterus to separate the vesicouterine peritoneum and create a bladder flap. The bladder was pushed away from the uterus. The bilateral uterine arteries were skeletonized, ligated and transected. The bilateral uterosacral and cardinal ligaments were ligated and transected. A colpotomy was made around the V-Care cervical cup and the uterus, cervix, and bilateral tubes were removed through the vagina. The vaginal cuff was closed vaginally using 0-Vicryl in a running locking stitch. This was tested for integrity using the surgeon's finger. After a change of gloves, the pneumoperitoneum was recreated and surgical site inspected, and found to be hemostatic. Bilateral ureters were  visualized  vermiuclating. No intraoperative injury to surrounding organs was noted. The ligasure was used to remove the adhesions surrounding the umbilicus, and the hernia adipose tissues were removed as well.    The decision was made to perform the remainder of the procedure externally, thus the abdomen was desufflated and all instruments were then removed. Please see Dr. Ines Bloomer surgical Op note for the details of the remainder of this part of the case.  All skin incisions were injected with kenalog, reapproximated with 4-0 prolene, and covered with surgical glue. The patient tolerated the procedures well.  All instruments, needles, and sponge counts were correct x 2. The patient was taken to the recovery room in stable condition.   ---- Larey Days, MD Attending Obstetrician and [facility]

## 2018-07-11 NOTE — Anesthesia Preprocedure Evaluation (Signed)
Anesthesia Evaluation  Patient identified by MRN, date of birth, ID band Patient awake    Reviewed: Allergy & Precautions, H&P , NPO status , Patient's Chart, lab work & pertinent test results, reviewed documented beta blocker date and time   Airway Mallampati: II  TM Distance: >3 FB Neck ROM: full    Dental no notable dental hx. (+) Teeth Intact   Pulmonary Current Smoker, former smoker,  Used inhaler this am   breath sounds clear to auscultation       Cardiovascular hypertension, Pt. on medications Normal cardiovascular exam Rhythm:regular Rate:Normal     Neuro/Psych PSYCHIATRIC DISORDERS Anxiety Depression Bipolar Disorder negative psych ROS   GI/Hepatic GERD  Medicated and Controlled,(+) Hepatitis -, C  Endo/Other  negative endocrine ROS  Renal/GU negative Renal ROS  Female GU complaint     Musculoskeletal  (+) Arthritis , Osteoarthritis,    Abdominal Normal abdominal exam  (+)   Peds negative pediatric ROS (+)  Hematology  (+) anemia , HIV,   Anesthesia Other Findings Past Medical History: No date: Anemia     Comment:  iron infusions x 2 No date: Anxiety No date: Arthritis     Comment:  Neck, left shoulder No date: Asthma     Comment:  uses inhaler 2-3 x week No date: Bipolar disorder (HCC) No date: Depression No date: Endometriosis No date: Fibroids No date: GERD (gastroesophageal reflux disease) No date: Headache     Comment:  Migraines 2009: Hepatitis     Comment:  Hep C No date: HIV infection ([place]) No date: Pancreatitis No date: Seasonal allergies No date: SVD (spontaneous vaginal delivery)     Comment:  x 1  Reproductive/Obstetrics negative OB ROS                             Anesthesia Physical  Anesthesia Plan  ASA: III  Anesthesia Plan: General   Post-op Pain Management:    Induction: Intravenous  PONV Risk Score and Plan: 4 or greater and Ondansetron,  Dexamethasone, Propofol, Midazolam and Scopolamine patch - Pre-op  Airway Management Planned: Oral ETT  Additional Equipment:   Intra-op Plan:   Post-operative Plan: Extubation in OR  Informed Consent: I have reviewed the patients History and Physical, chart, labs and discussed the procedure including the risks, benefits and alternatives for the proposed anesthesia with the patient or authorized representative who has indicated his/her understanding and acceptance.     Dental Advisory Given  Plan Discussed with: CRNA and Surgeon  Anesthesia Plan Comments:         Anesthesia Quick Evaluation

## 2018-07-11 NOTE — Discharge Instructions (Signed)
Discharge instructions:  Call office if you have any of the following: fever >101 F, chills, shortness of breath, excessive vaginal bleeding, incision drainage or problems, leg pain or redness, or any other concerns.   Activity: Do not lift > 10 lbs for 8 weeks.  No intercourse or tampons for 8 weeks.  No driving for 1-2 weeks.   You may feel some pain in your upper right abdomen/rib and right shoulder.  This is from the gas in the abdomen for surgery. This will subside over time, please be patient!  Take 800mg  Ibuprofen and 1000mg  Tylenol together around the clock, every 6 hours for at least the first 3-5 days.  After this you can take as needed.  This will help decrease inflammation and promote healing.  The narcotics you'll take just as needed, as they just trick your brain into thinking its not in pain.    Please don't limit yourself in terms of routine activity.  You will be able to do most things, although they may take longer to do or be a little painful.  You can do it!  Don't be a hero, but don't be a wimp either!    AMBULATORY SURGERY  DISCHARGE INSTRUCTIONS   1) The drugs that you were given will stay in your system until tomorrow so for the next 24 hours you should not:  A) Drive an automobile B) Make any legal decisions C) Drink any alcoholic beverage   2) You may resume regular meals tomorrow.  Today it is better to start with liquids and gradually work up to solid foods.  You may eat anything you prefer, but it is better to start with liquids, then soup and crackers, and gradually work up to solid foods.   3) Please notify your doctor immediately if you have any unusual bleeding, trouble breathing, redness and pain at the surgery site, drainage, fever, or pain not relieved by medication.    4) Additional Instructions: TAKE A STOOL SOFTENER TWICE A DAY WHILE TAKING NARCOTIC PAIN MEDICINE TO PREVENT CONSTIPATION   Please contact your physician with any problems or  Same Day Surgery at [phone], Monday through Friday 6 am to 4 pm, or [place] at [facility] number at [phone].

## 2018-07-11 NOTE — H&P (Signed)
Preoperative History and Physical  Teresa [name] is a 48 y.o. [phone] here for surgical management of chronic pelvic pain, failed endometrial ablation, and umbilical hernia. +viral load for HIV and Hep C at last check in Feb 2020.   No significant preoperative concerns.  Proposed surgery:  Joint surgery with general surgery and gynecology: Laparoscopic Total hysterectomy and bilateral salpingectomy, and umbilical hernia repair with mesh.  Past Medical History:  Diagnosis Date  . Anemia    iron infusions x 2  . Anxiety   . Arthritis    Neck, left shoulder  . Asthma    uses inhaler 2-3 x week  . Bipolar disorder ([place])   . Depression   . Endometriosis   . Fibroids   . GERD (gastroesophageal reflux disease)   . Headache    Migraines  . Hepatitis 2009   Hep C  . HIV infection ([place])   . Pancreatitis   . Seasonal allergies   . SVD (spontaneous vaginal delivery)    x 1   Past Surgical History:  Procedure Laterality Date  . CESAREAN SECTION     x2  . DILATION AND CURETTAGE OF UTERUS     x 2 - TAB  . HAND SURGERY Right    2 pins in wrist  . HYSTEROSCOPY N/A 10/11/2016   Procedure: HYSTEROSCOPY WITH HYDROTHERMAL ABLATION;  Surgeon: Donnamae Jude, MD;  Location: [facility];  Service: Gynecology;  Laterality: N/A;  Caryl Pina the [employer] rep will be here.  Confirmed on 09/02/16.   Marland Kitchen LAPAROSCOPIC TUBAL LIGATION Bilateral 10/11/2016   Procedure: LAPAROSCOPIC TUBAL LIGATION WITH FILSHIE CLIPS;  Surgeon: Donnamae Jude, MD;  Location: [facility];  Service: Gynecology;  Laterality: Bilateral;  . MOUTH SURGERY    . WISDOM TOOTH EXTRACTION     OB History  Gravida Para Term Preterm AB Living  5 3 3   2     SAB TAB Ectopic Multiple Live Births          3    # Outcome Date GA Lbr Len/2nd Weight Sex Delivery Anes PTL Lv  5 AB           4 AB           3 Term      CS-LTranv     2 Term      CS-LTranv     1 Term      Vag-Spont     Patient denies any other pertinent gynecologic issues.   No current  facility-administered medications on file prior to encounter.    Current Outpatient Medications on File Prior to Encounter  Medication Sig Dispense Refill  . abacavir-dolutegravir-lamiVUDine (TRIUMEQ) 600-50-300 MG tablet Take 1 tablet by mouth daily. 30 tablet 1  . albuterol (PROAIR HFA) 108 (90 Base) MCG/ACT inhaler Inhale 2 puffs into the lungs every 6 (six) hours as needed for wheezing or shortness of breath.    Marland Kitchen buPROPion (WELLBUTRIN) 75 MG tablet Take 75 mg by mouth 2 (two) times daily.    . cetirizine (ZYRTEC) 10 MG tablet Take 10 mg by mouth daily as needed for allergies.    Marland Kitchen etodolac (LODINE) 400 MG tablet Take 400 mg by mouth 2 (two) times daily as needed (pain during menstruation).    . busPIRone (BUSPAR) 5 MG tablet Take 5 mg by mouth 2 (two) times daily.    . fluticasone (FLONASE) 50 MCG/ACT nasal spray Place 1 spray into both nostrils daily as needed for allergies or rhinitis.  Allergies  Allergen Reactions  . Naproxen Sodium Hives    Can take ibuprofen without causing any hives    Social History:   reports that she has quit smoking. Her smoking use included cigarettes. She has a 6.25 pack-year smoking history. She has never used smokeless tobacco. She reports current alcohol use. She reports current drug use. Drug: Marijuana.  Family History  Problem Relation Age of Onset  . Breast cancer Paternal Grandmother   . Breast cancer Cousin     Review of Systems: Noncontributory  PHYSICAL EXAM: Last menstrual period 06/12/2018. General appearance - alert, well appearing, and in no distress Chest - clear to auscultation, no wheezes, rales or rhonchi, symmetric air entry Heart - normal rate and regular rhythm Abdomen - soft, nontender, nondistended, no masses or organomegaly Pelvic - examination not indicated Extremities - peripheral pulses normal, no pedal edema, no clubbing or cyanosis  Labs: Results for orders placed or performed during the hospital encounter of  07/11/18 (from the past 336 hour(s))  Pregnancy, urine POC   Collection Time: 07/11/18 10:48 AM  Result Value Ref Range   Preg Test, Ur NEGATIVE NEGATIVE  Results for orders placed or performed during the hospital encounter of 07/06/18 (from the past 336 hour(s))  Type and screen [place]   Collection Time: 07/06/18 12:31 PM  Result Value Ref Range   ABO/RH(D) O POS    Antibody Screen NEG    Sample Expiration 07/20/2018    Extend sample reason      NO TRANSFUSIONS OR PREGNANCY IN THE PAST 3 MONTHS Performed at [facility], [address] [place], [place] [address]   Results for orders placed or performed during the hospital encounter of 07/05/18 (from the past 336 hour(s))  Comprehensive metabolic panel   Collection Time: 07/05/18  2:41 PM  Result Value Ref Range   Sodium 138 135 - 145 mmol/L   Potassium 3.5 3.5 - 5.1 mmol/L   Chloride 105 98 - 111 mmol/L   CO2 24 22 - 32 mmol/L   Glucose, Bld 118 (H) 70 - 99 mg/dL   BUN 9 6 - 20 mg/dL   Creatinine, Ser 0.83 0.44 - 1.00 mg/dL   Calcium 8.7 (L) 8.9 - 10.3 mg/dL   Total Protein 7.9 6.5 - 8.1 g/dL   Albumin 3.7 3.5 - 5.0 g/dL   AST 49 (H) 15 - 41 U/L   ALT 38 0 - 44 U/L   Alkaline Phosphatase 88 38 - 126 U/L   Total Bilirubin 0.9 0.3 - 1.2 mg/dL   GFR calc non Af Amer >60 >60 mL/min   GFR calc Af Amer >60 >60 mL/min   Anion gap 9 5 - 15  CBC   Collection Time: 07/05/18  2:41 PM  Result Value Ref Range   WBC 4.0 4.0 - 10.5 K/uL   RBC 4.51 3.87 - 5.11 MIL/uL   Hemoglobin 12.1 12.0 - 15.0 g/dL   HCT 36.8 36.0 - 46.0 %   MCV 81.6 80.0 - 100.0 fL   MCH 26.8 26.0 - 34.0 pg   MCHC 32.9 30.0 - 36.0 g/dL   RDW 15.0 11.5 - 15.5 %   Platelets 189 150 - 400 K/uL   nRBC 0.0 0.0 - 0.2 %    Imaging Studies: US Pelvic Complete W Transvaginal And Torsion R/o  Result Date: 06/13/2018 CLINICAL DATA:  Initial evaluation for lower abdominal pain and cramping. EXAM: TRANSABDOMINAL AND TRANSVAGINAL  ULTRASOUND OF PELVIS DOPPLER ULTRASOUND  OF OVARIES TECHNIQUE: Both transabdominal and transvaginal ultrasound examinations of the pelvis were performed. Transabdominal technique was performed for global imaging of the pelvis including uterus, ovaries, adnexal regions, and pelvic cul-de-sac. It was necessary to proceed with endovaginal exam following the transabdominal exam to visualize the uterus, endometrium, and ovaries. Color and duplex Doppler ultrasound was utilized to evaluate blood flow to the ovaries. COMPARISON:  Prior ultrasound from 03/10/2017 FINDINGS: Uterus Measurements: 10.8 x 6.9 x 7.5 cm = volume: 293 mL. No fibroids or other mass visualized. Endometrium Thickness: 8 mm.  No focal abnormality visualized. Right ovary Measurements: 3.9 x 1.6 x 1.8 cm = volume: 5.9 mL. Normal appearance/no adnexal mass. Left ovary Measurements: 6.4 x 3.2 x 2.6 cm = volume: 27.2 mL. 3.2 x 2.4 x 2.2 cm simple anechoic cyst. No other adnexal mass. Pulsed Doppler evaluation of both ovaries demonstrates normal low-resistance arterial and venous waveforms. Other findings Trace free fluid within the pelvis, presumably physiologic. IMPRESSION: 1. 3.2 cm simple left ovarian cyst, most compatible with a normal physiologic follicular cyst. This has benign characteristics and is a common finding in premenopausal females. No imaging follow up is required. This follows consensus guidelines: Simple Adnexal Cysts: SRU Consensus Conference Update on Follow-up and Reporting. Radiology 2019; [id number]. 2. Otherwise unremarkable and normal pelvic ultrasound. No evidence for torsion. Electronically Signed   By: Jeannine Boga M.D.   On: 06/13/2018 00:31    Assessment: 47yo with endometriosis, pelvic pain, failed endometrial ablation, and umbilical hernia  Plan: Patient will undergo surgical management with total laparoscopic hysterectomy and bilateral salpingectomy, with umbilical hernia repair by Dr. Lysle Pearl.   The risks of  surgery were discussed in detail with the patient including but not limited to: bleeding which may require transfusion or reoperation; infection which may require antibiotics; injury to surrounding organs which may involve bowel, bladder, ureters ; need for additional procedures including laparoscopy or laparotomy; thromboembolic phenomenon, surgical site problems and other postoperative/anesthesia complications. Likelihood of success in alleviating the patient's condition was discussed. Routine postoperative instructions will be reviewed with the patient and her family in detail after surgery.  The patient concurred with the proposed plan, giving informed written consent for the surgery.  Patient has been NPO since last night she will remain NPO for procedure.  Anesthesia and OR aware.  Preoperative prophylactic antibiotics and SCDs ordered on call to the OR.  To OR when ready.  ----- Larey Days, MD Attending Obstetrician and Gynecologist [facility], Department of [facility] 07/11/2018 10:53 AM

## 2018-07-11 NOTE — Transfer of Care (Signed)
Immediate Anesthesia Transfer of Care Note  Patient: Teresa [name]  Procedure(s) Performed: HYSTERECTOMY TOTAL LAPAROSCOPIC (N/A ) LAPAROSCOPIC BILATERAL SALPINGECTOMY (Bilateral ) LAPAROSCOPIC UMBILICAL HERNIA REPAIR (N/A )  Patient Location: PACU  Anesthesia Type:General  Level of Consciousness: drowsy and patient cooperative  Airway & Oxygen Therapy: Patient Spontanous Breathing and Patient connected to face mask oxygen  Post-op Assessment: Report given to RN and Post -op Vital signs reviewed and stable  Post vital signs: Reviewed and stable  Last Vitals:  Vitals Value Taken Time  BP 140/96 07/11/2018  2:28 PM  Temp 36.4 C 07/11/2018  2:28 PM  Pulse 94 07/11/2018  2:33 PM  Resp 18 07/11/2018  2:33 PM  SpO2 97 % 07/11/2018  2:33 PM  Vitals shown include unvalidated device data.  Last Pain:  Vitals:   07/11/18 1428  TempSrc:   PainSc: Asleep         Complications: No apparent anesthesia complications

## 2018-07-11 NOTE — Anesthesia Post-op Follow-up Note (Signed)
Anesthesia QCDR form completed.        

## 2018-07-11 NOTE — Op Note (Addendum)
Preoperative diagnosis: Incisional hernia reducible Postoperative diagnosis: same as above, intraabdoiminal adhesions Procedure:  Open primary incisional hernia repair, lysis of adhesions  Anesthesia: GETA  Surgeon: Benjamine Sprague Assistant: Ward (for better exposure and camera manipulation during laparoscpic stage  Wound Classification: Clean Contaminated  Specimen: none  Complications: None  Estimated Blood Loss: minimal for this portion  Indications:see HPI  Findings: 1. 1cm x 0.5cm reducible inicisional hernia  Description of procedure:   This portion of the procedure started after the hysterectomy was completed.  While having in a laparoscopic view, the abdominal wall is inspected and several of the omental adhesions were taken down via LigaSure.  Once the area was cleared there was noted to be a 5 mm hernia at the former incision site, deep to umbilicus.  Small amount of hernia sac contents was easily reduced, and ligated via LigaSure.  Since the defect was so small laparoscopically, decision was made to repeat a primary repair at this time since the patient was also hoping to have her umbilical stalk reattached to the abdominal wall as well.    Laparoscope and trochars were all removed under direct visualization and abdomen desufflated.  Curvilinear incision made along the natural skin line underneath the umbilicus and dissection carried down to the fascia where previous sutures were noted.  Further dissection towards the umbilical stalk revealed a 1 cm x 5 mm hernia within the previous incision.  Preperitoneal fat was easily reduced back through this tiny opening.  After clearing the edges 0 Ethibond x1 was used to close the hole in a figure-of-eight fashion.  The umbilical stalk was reattached to the fascia using 2-0 Vicryl.  Due to history of keloid formation, Kenalog injections were performed at each incision site prior to closing the skin with 4-0 Prolene in an interrupted  fashion in the dermal layer.  All wounds were then dressed with Dermabond.  Patient was then successfully awakened and transferred to PACU in stable condition.  At the end of the procedure sponge and instrument counts were correct.

## 2018-07-11 NOTE — Anesthesia Procedure Notes (Signed)
Procedure Name: Intubation Date/Time: 07/11/2018 11:40 AM Performed by: Kelton Pillar, CRNA Pre-anesthesia Checklist: Patient identified, Emergency Drugs available, Suction available and Patient being monitored Patient Re-evaluated:Patient Re-evaluated prior to induction Oxygen Delivery Method: Circle system utilized Preoxygenation: Pre-oxygenation with 100% oxygen Induction Type: IV induction Ventilation: Mask ventilation without difficulty Laryngoscope Size: McGraph and 3 Grade View: Grade I Tube type: Oral Tube size: 6.5 mm Number of attempts: 1 Airway Equipment and Method: Stylet Placement Confirmation: ETT inserted through vocal cords under direct vision,  positive ETCO2,  CO2 detector and breath sounds checked- equal and bilateral Secured at: 21 cm Tube secured with: Tape Dental Injury: Teeth and Oropharynx as per pre-operative assessment

## 2018-07-12 ENCOUNTER — Encounter: Payer: Self-pay | Admitting: Obstetrics & Gynecology

## 2018-07-13 LAB — SURGICAL PATHOLOGY

## 2018-07-14 ENCOUNTER — Other Ambulatory Visit: Payer: Self-pay

## 2018-07-14 ENCOUNTER — Other Ambulatory Visit: Payer: Self-pay | Admitting: Obstetrics & Gynecology

## 2018-07-14 ENCOUNTER — Ambulatory Visit
Admission: RE | Admit: 2018-07-14 | Discharge: 2018-07-14 | Disposition: A | Discharge status: Home / Self Care | Payer: BLUE CROSS/BLUE SHIELD | Source: Ambulatory Visit | Attending: Obstetrics & Gynecology | Admitting: Obstetrics & Gynecology

## 2018-07-14 DIAGNOSIS — R112 Nausea with vomiting, unspecified: Secondary | ICD-10-CM

## 2018-07-14 HISTORY — DX: Essential (primary) hypertension: I10

## 2018-07-14 MED ORDER — IOHEXOL 300 MG/ML  SOLN
100.0000 mL | Freq: Once | INTRAMUSCULAR | Status: AC | PRN
  Administered 2018-07-14: 100 mL via INTRAVENOUS

## 2018-08-02 ENCOUNTER — Encounter: Payer: Self-pay | Admitting: *Deleted

## 2018-08-24 ENCOUNTER — Other Ambulatory Visit: Payer: Self-pay | Admitting: Licensed Clinical Social Worker

## 2018-08-24 DIAGNOSIS — B20 Human immunodeficiency virus [HIV] disease: Secondary | ICD-10-CM

## 2018-08-24 MED ORDER — ABACAVIR-DOLUTEGRAVIR-LAMIVUD 600-50-300 MG PO TABS: 1.0000 | ORAL_TABLET | Freq: Every day | ORAL | 3 refills | Status: DC

## 2018-09-22 NOTE — Anesthesia Postprocedure Evaluation (Signed)
Anesthesia Post Note  Patient: Teresa [name]  Procedure(s) Performed: HYSTERECTOMY TOTAL LAPAROSCOPIC (N/A ) LAPAROSCOPIC BILATERAL SALPINGECTOMY (Bilateral ) LAPAROSCOPIC UMBILICAL HERNIA REPAIR (N/A )  Patient location during evaluation: PACU Anesthesia Type: General Level of consciousness: awake and alert Pain management: pain level controlled Vital Signs Assessment: post-procedure vital signs reviewed and stable Respiratory status: spontaneous breathing, nonlabored ventilation and respiratory function stable Cardiovascular status: blood pressure returned to baseline and stable Postop Assessment: no apparent nausea or vomiting Anesthetic complications: no     Last Vitals:  Vitals:   07/11/18 1516 07/11/18 1616  BP: (!) 161/111 (!) 144/97  Pulse: 91 89  Resp: 16   Temp: (!) 36.1 C   SpO2: 100% 100%    Last Pain:  Vitals:   07/11/18 1616  TempSrc:   PainSc: 3                  Brendin Situ Harvie Heck

## 2018-10-31 ENCOUNTER — Encounter: Payer: Self-pay | Admitting: *Deleted

## 2018-12-30 ENCOUNTER — Other Ambulatory Visit: Payer: Self-pay

## 2018-12-30 ENCOUNTER — Encounter: Payer: Self-pay | Admitting: Emergency Medicine

## 2018-12-30 DIAGNOSIS — Z5321 Procedure and treatment not carried out due to patient leaving prior to being seen by health care provider: Secondary | ICD-10-CM | POA: Insufficient documentation

## 2018-12-30 DIAGNOSIS — G43909 Migraine, unspecified, not intractable, without status migrainosus: Secondary | ICD-10-CM | POA: Insufficient documentation

## 2018-12-30 NOTE — ED Triage Notes (Signed)
Patient states that she has had migraines over the past two weeks. Patient states that she saw her PCP on Wednesday and was started in immitrex. Patient states that it is not helping her headache. Patient states that she has vomited twice today.

## 2018-12-31 ENCOUNTER — Emergency Department
Admission: EM | Admit: 2018-12-31 | Discharge: 2018-12-31 | Disposition: A | Discharge status: Home / Self Care | Payer: Medicaid Other | Attending: Emergency Medicine | Admitting: Emergency Medicine

## 2019-02-08 ENCOUNTER — Ambulatory Visit: Payer: BLUE CROSS/BLUE SHIELD | Admitting: Infectious Diseases

## 2019-02-13 ENCOUNTER — Other Ambulatory Visit: Payer: Self-pay

## 2019-02-13 ENCOUNTER — Encounter: Payer: Self-pay | Admitting: Infectious Diseases

## 2019-02-13 ENCOUNTER — Ambulatory Visit: Payer: Medicaid Other | Attending: Infectious Diseases | Admitting: Infectious Diseases

## 2019-02-13 ENCOUNTER — Other Ambulatory Visit
Admission: RE | Admit: 2019-02-13 | Discharge: 2019-02-13 | Disposition: A | Discharge status: Home / Self Care | Payer: BLUE CROSS/BLUE SHIELD | Source: Ambulatory Visit | Attending: Infectious Diseases | Admitting: Infectious Diseases

## 2019-02-13 ENCOUNTER — Other Ambulatory Visit: Payer: Self-pay | Admitting: Infectious Diseases

## 2019-02-13 VITALS — BP 160/119 | HR 74 | Temp 97.9°F | Ht 67.0 in | Wt 171.0 lb

## 2019-02-13 DIAGNOSIS — Z8742 Personal history of other diseases of the female genital tract: Secondary | ICD-10-CM

## 2019-02-13 DIAGNOSIS — Z90710 Acquired absence of both cervix and uterus: Secondary | ICD-10-CM

## 2019-02-13 DIAGNOSIS — F419 Anxiety disorder, unspecified: Secondary | ICD-10-CM

## 2019-02-13 DIAGNOSIS — K429 Umbilical hernia without obstruction or gangrene: Secondary | ICD-10-CM

## 2019-02-13 DIAGNOSIS — B182 Chronic viral hepatitis C: Secondary | ICD-10-CM

## 2019-02-13 DIAGNOSIS — B1920 Unspecified viral hepatitis C without hepatic coma: Secondary | ICD-10-CM

## 2019-02-13 DIAGNOSIS — G43909 Migraine, unspecified, not intractable, without status migrainosus: Secondary | ICD-10-CM

## 2019-02-13 DIAGNOSIS — B20 Human immunodeficiency virus [HIV] disease: Secondary | ICD-10-CM

## 2019-02-13 DIAGNOSIS — F319 Bipolar disorder, unspecified: Secondary | ICD-10-CM

## 2019-02-13 DIAGNOSIS — Z79899 Other long term (current) drug therapy: Secondary | ICD-10-CM

## 2019-02-13 DIAGNOSIS — I10 Essential (primary) hypertension: Secondary | ICD-10-CM

## 2019-02-13 DIAGNOSIS — Z886 Allergy status to analgesic agent status: Secondary | ICD-10-CM

## 2019-02-13 DIAGNOSIS — Z87891 Personal history of nicotine dependence: Secondary | ICD-10-CM

## 2019-02-13 LAB — COMPREHENSIVE METABOLIC PANEL
ALT: 94 U/L — ABNORMAL HIGH (ref 0–44)
AST: 107 U/L — ABNORMAL HIGH (ref 15–41)
Albumin: 4 g/dL (ref 3.5–5.0)
Alkaline Phosphatase: 136 U/L — ABNORMAL HIGH (ref 38–126)
Anion gap: 7 (ref 5–15)
BUN: 9 mg/dL (ref 6–20)
CO2: 30 mmol/L (ref 22–32)
Calcium: 8.9 mg/dL (ref 8.9–10.3)
Chloride: 100 mmol/L (ref 98–111)
Creatinine, Ser: 0.84 mg/dL (ref 0.44–1.00)
GFR calc Af Amer: >60 mL/min (ref >60)
GFR calc non Af Amer: >60 mL/min (ref >60)
Glucose, Bld: 98 mg/dL (ref 70–99)
Potassium: 3.8 mmol/L (ref 3.5–5.1)
Sodium: 137 mmol/L (ref 135–145)
Total Bilirubin: 0.8 mg/dL (ref 0.3–1.2)
Total Protein: 7.5 g/dL (ref 6.5–8.1)

## 2019-02-13 LAB — PROTIME-INR
INR: 1 (ref 0.8–1.2)
Prothrombin Time: 13 seconds (ref 11.4–15.2)

## 2019-02-13 LAB — APTT: aPTT: 31 seconds (ref 24–36)

## 2019-02-13 MED ORDER — NYSTATIN 100000 UNIT/ML MT SUSP: 2.0000 mL | Freq: Four times a day (QID) | OROMUCOSAL | 0 refills | Status: DC

## 2019-02-13 MED ORDER — AMLODIPINE BESYLATE 5 MG PO TABS: 5.0000 mg | ORAL_TABLET | Freq: Every day | ORAL | 0 refills | Status: DC

## 2019-02-13 MED ORDER — AMLODIPINE BESYLATE 10 MG PO TABS: 10.0000 mg | ORAL_TABLET | Freq: Every day | ORAL | 0 refills | Status: DC

## 2019-02-13 NOTE — Progress Notes (Signed)
NAME: Teresa [name]  DOB: 11/16/1970  MRN: [id number]  Date/Time: 02/13/2019 10:17 AM   Subjective:  Second visit to my clinic ? Teresa [name] is a 48 y.o. female with a history of HIV diagnosed during her pregnancy in 2009 was last seen in my clinic in February 2020.  Patient was on Triumeq after a 2-year gap.  Since her last visit patient has had hysterectomy and umbilical hernia repair March 2020.  She is also being treated for migraine because of severe headaches.  She has seen the neurologist.  She initially was started on Topamax but that caused some side effects and now she is on nortriptyline at bedtime.  Patient says she has been adherent to HIV medication.  She may have missed a day or so in the past month. Patient continues to have headaches. I see that her blood pressure has been high.  In February 2020 her blood pressure was high and had given her hydrochlorothiazide as she used to take that and lisinopril in the past.  But it was never continued by her PCP.  I see since April has had consistently high blood pressure with diastolic being above [id number].    HAARt history Truvada  isentress Triumeq Acquired thru-heterosexual contact Genotype-06/01/16 NRTI AbacavirZiagen Sensitive  RAMs*: None DidanosineVidexSensitive  RAMs*: None Emtricitabine EmtrivaSensitive  RAMs*: None LamivudineEpivir Sensitive  RAMs*: None Stavudine ZeritSensitive  RAMs*: None Tenofovir Viread Sensitive  RAMs*: None ZidovudineRetrovir Sensitive  RAMs*: None NNRTI Efavirenz SustivaSensitive  RAMs*: None EtravirineIntelenceSensitive  RAMs*: None NevirapineViramune Sensitive  RAMs*: None Rilpivirine EdurantSensitive  RAMs*: None INI DolutegravirTivicaySensitive  RAMs*: L74M, T97A ElvitegravirVitekta Resistance  Possible1  RAMs*: L74M, T97A Raltegravir Isentress Resistance Possible1  RAMs*: L74M, T97A PI AtazanavirReyatazSensitive  RAMs*: I62V Atazanavir/rReyataz / rSensitive  RAMs*: I62V Darunavir/r Prezista / r Sensitive  RAMs*: None Fosamprenavir/r Lexiva / r Sensitive  RAMs*: None Indinavir/r Crixivan / r Sensitive  RAMs*: None Lopinavir KaletraSensitive  RAMs*: None NelfinavirViracept Sensitive  RAMs*: None Ritonavir Norvir Sensitive  RAMs*: None Saquinavir/rInvirase / r Sensitive  RAMs*: I62V Tipranavir/rAptivus / rSensitive  RAMs*: None *RAMs = Resistance Associated Mutations observed Summary of Mutations Observed: RT: V35E, T39K, Q102K, D123E, C162S, E169D, D177E, G196E, I202V, F214L, A272P, R277K, V293I, E297K IN: S17N, V72I, L74M, T97A, V113I, T125V, T206S, V234L, D256E PR: R41K, I62V, L63P, I64M   PMH . Hepatitis C infection  . Endometriosis  . Migraine headache  . Bipolar affective disorder (CMS-HCC)  . Abnormal uterine bleeding (AUB), unspecified  . Iron deficiency anemia due to chronic blood loss  Pancreatitis Fibroid ?Asthma Past Surgical History:  Procedure Laterality Date  . CESAREAN SECTION     x2  . DILATION AND CURETTAGE OF UTERUS     x 2 - TAB  . HAND SURGERY Right    2 pins in wrist  . HYSTEROSCOPY N/A 10/11/2016   Procedure: HYSTEROSCOPY WITH HYDROTHERMAL ABLATION;  Surgeon: Donnamae Jude, MD;  Location: [facility];  Service: Gynecology;  Laterality: N/A;  Caryl Pina the HTA rep will be here.  Confirmed on 09/02/16.   Marland Kitchen LAPAROSCOPIC BILATERAL SALPINGECTOMY Bilateral 07/11/2018   Procedure: LAPAROSCOPIC BILATERAL SALPINGECTOMY;  Surgeon: Ward, Honor Loh, MD;  Location: [facility];  Service: Gynecology;  Laterality: Bilateral;  . LAPAROSCOPIC HYSTERECTOMY N/A 07/11/2018   Procedure: HYSTERECTOMY TOTAL LAPAROSCOPIC;  Surgeon: Ward,  Honor Loh, MD;  Location: [facility];  Service: Gynecology;  Laterality: N/A;  . LAPAROSCOPIC TUBAL LIGATION Bilateral 10/11/2016   Procedure: LAPAROSCOPIC TUBAL LIGATION WITH FILSHIE CLIPS;  Surgeon: Kennon Rounds,  Standley Dakins, MD;  Location: [facility];  Service: Gynecology;  Laterality: Bilateral;  . MOUTH SURGERY    . UMBILICAL HERNIA REPAIR N/A 07/11/2018   Procedure: LAPAROSCOPIC UMBILICAL HERNIA REPAIR;  Surgeon: Benjamine Sprague, DO;  Location: [facility];  Service: General;  Laterality: N/A;  . WISDOM TOOTH EXTRACTION       SH Former smoker Former drug use Lives with her daughter who is 59 years old.  Also has 2 other  adult children age 71 and 50.   FH High blood pressure (Hypertension) Mother  . Diabetes type II Mother  . Lung cancer Father  . Lung cancer Maternal Grandmother  . Stomach cancer Paternal Grandmother     Allergies  Allergen Reactions  . Naproxen Sodium Hives    Can take ibuprofen without causing any hives   ? Current Outpatient Medications  Medication Sig Dispense Refill  . abacavir-dolutegravir-lamiVUDine (TRIUMEQ) 600-50-300 MG tablet Take 1 tablet by mouth daily. 30 tablet 3  . albuterol (PROAIR HFA) 108 (90 Base) MCG/ACT inhaler Inhale 2 puffs into the lungs every 6 (six) hours as needed for wheezing or shortness of breath.    Marland Kitchen buPROPion (WELLBUTRIN) 75 MG tablet Take 75 mg by mouth 2 (two) times daily.    . busPIRone (BUSPAR) 5 MG tablet Take 5 mg by mouth 2 (two) times daily.    . butalbital-acetaminophen-caffeine (FIORICET) 50-325-40 MG tablet Take 1 tablet by mouth as needed.    . cetirizine (ZYRTEC) 10 MG tablet Take 10 mg by mouth daily as needed for allergies.    . fluticasone (FLONASE) 50 MCG/ACT nasal spray Place 1 spray into both nostrils daily as needed for allergies or rhinitis.    Marland Kitchen ibuprofen (ADVIL,MOTRIN) 800 MG tablet Take 1 tablet (800 mg total) by mouth every 6 (six) hours. 45 tablet 0  . oxyCODONE (ROXICODONE) 5 MG immediate release tablet Take 1 tablet  (5 mg total) by mouth every 4 (four) hours as needed. 21 tablet 0  . promethazine (PHENERGAN) 25 MG tablet Take 1 tablet (25 mg total) by mouth every 6 (six) hours as needed for nausea or vomiting. 20 tablet 0  . sertraline (ZOLOFT) 50 MG tablet Take 50 mg by mouth at bedtime. Take 2 tablets     No current facility-administered medications for this visit.     REVIEW OF SYSTEMS:  Const: negative fever, negative chills, negative weight loss Eyes: negative diplopia or visual changes, negative eye pain ENT: negative coryza, negative sore throat, complains of bad taste in the mouth Tongue coated Resp: negative cough, hemoptysis, dyspnea Cards: negative for chest pain, palpitations, lower extremity edema GU: negative for frequency, dysuria and hematuria Skin: negative for rash and pruritus Heme: negative for easy bruising and gum/nose bleeding MS: negative for myalgias, arthralgias, back pain and muscle weakness Neurolo: Persistent headaches, dizziness,  Psych: Anxiety and depression  Objective:  VITALS: BP (!) 160/119 (BP Location: Left Arm, Patient Position: Sitting, Cuff Size: Normal)   Pulse 74   Temp 97.9 F (36.6 C) (Oral)   Ht 5\' 7"  (1.702 m)   Wt 171 lb (77.6 kg)   LMP 07/10/2018 (Exact Date)   BMI 26.78 kg/m    Repeat blood pressure 160/106 PHYSICAL EXAM:  General: Alert, cooperative, no distress, appears stated age.  Head: Normocephalic, without obvious abnormality, atraumatic. Eyes: Conjunctivae clear, anicteric sclerae. Pupils are equal Nose: Nares normal. No drainage or sinus tenderness. Throat: Tonsillar enlargement.  Tongue coated white. Neck: Supple, symmetrical, no adenopathy, thyroid: non tender  no carotid bruit and no JVD. Back: No CVA tenderness. Lungs: Clear to auscultation bilaterally. No Wheezing or Rhonchi. No rales. Heart: Regular rate and rhythm, no murmur, rub or gallop. Abdomen: Soft, non-tender,not distended. Bowel sounds normal. No masses, lap scar,  umbilical hernia, hypopigmented patch Extremities: Extremities normal, atraumatic, no cyanosis. No edema. No clubbing Skin: No rashes or lesions. Not Jaundiced Lymph: Cervical, supraclavicular normal. Neurologic: Grossly non-focal Pertinent Labs Health maintenance Vaccination  Vaccine Date last given comment  Influenza    Hepatitis B 02/24/2009, 09/11/2008, 06/26/2008 [facility]  Hepatitis A    Prevnar-PCV-13    Pneumovac-PPSV-23    TdaP 03/10/2011 [facility]  HPV    Shingrix ( zoster vaccine)     ______________________  Labs Lab Result  Date comment  HIV VL  5030  06/01/2018   CD4 464 ( 38.7%) 06/01/18   Genotype L74M, T97A R to Ral and Elvi  06/01/2018   [id number]     HIV antibody     RPR  NR  06/01/2018   Quantiferon Gold  negative  06/01/2018   Hep C ab  10 million  06/01/2018   Hepatitis B-ab,ag,c     Hepatitis A-IgM, IgG /T     Lipid     GC/CHL     PAP     HB,PLT,Cr, LFT 12/189/0.8/49/38 06/01/18     Preventive  Procedure Result  Date comment  colonoscopy     Mammogram Probably benign bilateral breast calcifications stable for over 66yr  02/14/17   Dental exam     Opthal       Impression/Recommendation ?48 year old female follow-up visit for HIV  HIV/ AIDS: On Triumeq.  Will check her labs today.  She states that she is adherent at least 95% of the time.  Genotype has resistance to raltegravir and elvitegravir- bt sensitive to [employer] and Dolutegravir  Hypertension: Has been consistently high since March 2020.  Has not on any medication.  Today blood pressure was 160/116.  Patient has been having headaches which has been attributed to migraine.  Start on amlodipine 5 mg until she sees her primary care physician Dr. Edwina Barth.  Call Dr. Tillman Sers office and left a message for him.  Gave her a prescription for 2 weeks only.  Hepatitis C not on treatment yet.  We will get ultrasound elastography today and also some labs.  We will plan to start  soon.  Migraine on nortriptyline and Fioricet  Bipolar disorder on buspirone and bupropion.  Metallic taste of the tongue with coated tongue will try nystatin swish and swallow.  __Dysmenorrhea, menorrhagia, endometriosis and fibroids.  Status post hysterectomy.  Umbilical hernia repair  She will need Prevnar/Pneumovax next visit  Follow-up in 2 months.  Discussed the management with the patient in great detail. Adherence to visits and medications reinforced.

## 2019-02-13 NOTE — Patient Instructions (Addendum)
You are here for follow up of HIV . Today will do labs- You have been having headache and your BP has been high for the past 4 months- it was 160/106 in my office- I am giving Amlodipine 5mg  once a day for 2 weeks- please follow up with your PCP ASAP We will treat you for HEPC in the near future once BP under control and you have labs and ultrasound-

## 2019-02-14 LAB — T-HELPER CELLS CD4/CD8 %
% CD 4 Pos. Lymph.: 46.1 % (ref 30.8–58.5)
Absolute CD 4 Helper: 645 /uL (ref 359–1519)
Basophils Absolute: 0 10*3/uL (ref 0.0–0.2)
Basos: 1 %
CD3+CD4+ Cells/CD3+CD8+ Cells Bld: 1.71 (ref 0.92–3.72)
CD3+CD8+ Cells # Bld: 377 /uL (ref 109–897)
CD3+CD8+ Cells NFr Bld: 26.9 % (ref 12.0–35.5)
EOS (ABSOLUTE): 0.1 10*3/uL (ref 0.0–0.4)
Eos: 2 %
Hematocrit: 41 % (ref 34.0–46.6)
Hemoglobin: 14.2 g/dL (ref 11.1–15.9)
Immature Grans (Abs): 0 10*3/uL (ref 0.0–0.1)
Immature Granulocytes: 0 %
Lymphocytes Absolute: 1.4 10*3/uL (ref 0.7–3.1)
Lymphs: 47 %
MCH: 29.3 pg (ref 26.6–33.0)
MCHC: 34.6 g/dL (ref 31.5–35.7)
MCV: 85 fL (ref 79–97)
Monocytes Absolute: 0.2 10*3/uL (ref 0.1–0.9)
Monocytes: 7 %
Neutrophils Absolute: 1.3 10*3/uL — ABNORMAL LOW (ref 1.4–7.0)
Neutrophils: 43 %
Platelets: 167 10*3/uL (ref 150–450)
RBC: 4.85 x10E6/uL (ref 3.77–5.28)
RDW: 14.6 % (ref 11.7–15.4)
WBC: 3 10*3/uL — ABNORMAL LOW (ref 3.4–10.8)

## 2019-02-14 LAB — HIV-1 RNA QUANT-NO REFLEX-BLD
HIV 1 RNA Quant: <20 copies/mL
LOG10 HIV-1 RNA: UNDETERMINED log10copy/mL

## 2019-02-14 LAB — HCV RNA (INTERNATIONAL UNITS)
HCV RNA (International Units): 16700000 IU/mL
HCV log10: 7.223 log10 IU/mL

## 2019-02-14 LAB — AFP TUMOR MARKER: AFP, Serum, Tumor Marker: 73.6 ng/mL — ABNORMAL HIGH (ref 0.0–8.3)

## 2019-02-14 LAB — HCV RNA QUANT

## 2019-02-15 LAB — HEPATITIS C GENOTYPE

## 2019-02-22 ENCOUNTER — Ambulatory Visit: Payer: Medicaid Other

## 2019-02-23 ENCOUNTER — Other Ambulatory Visit: Payer: Self-pay

## 2019-02-23 ENCOUNTER — Ambulatory Visit
Admission: RE | Admit: 2019-02-23 | Discharge: 2019-02-23 | Disposition: A | Discharge status: Home / Self Care | Payer: BLUE CROSS/BLUE SHIELD | Source: Ambulatory Visit | Attending: Infectious Diseases | Admitting: Infectious Diseases

## 2019-02-23 DIAGNOSIS — B182 Chronic viral hepatitis C: Secondary | ICD-10-CM | POA: Diagnosis not present

## 2019-03-06 ENCOUNTER — Telehealth: Payer: Self-pay | Admitting: Infectious Diseases

## 2019-03-06 NOTE — Telephone Encounter (Signed)
Spoke with patient and she has since stopped using the suspension.  Advised her to follow up with PCP if problem persists.  Patient agreeable.

## 2019-03-06 NOTE — Addendum Note (Signed)
Addended by: Amado Coe on: 03/06/2019 12:20 PM   Modules accepted: Orders

## 2019-03-06 NOTE — Telephone Encounter (Signed)
Teresa [name], Can you please call the patient and tell her that on her last examination she had complaind of bitter taste in her mouth- I was not sure it was thrush but had given nystatin to try- since it tdid not work it is not thrush and she does not need any other medication but observe without any. If her bitter tate persisit we will have to figure out which medicine is causing that as she is on many meds given by her PCP. thx

## 2019-03-06 NOTE — Telephone Encounter (Signed)
Patient called and left message regarding oral medicine that was called in for thrush   Patient states she has been taking for 2 weeks now and feels that it is not helping  Patient stated she may stop taking and requesting something else  [phone]

## 2019-03-13 ENCOUNTER — Encounter: Payer: Self-pay | Admitting: Infectious Diseases

## 2019-03-13 NOTE — Progress Notes (Unsigned)
Tests result DATE comment  HEPC RNA 16,700,000 02/13/19   HEPC  Genotype 1a 02/13/19   Hepatitis B profile Ab reactive Sag Neg  09/09/10 06/01/16   Hepatitis A status Positive 06/01/16   PT/PTT 13/1    AST, ALT, Bilirubin 107/94/0.8    Albumin 4    creatinine 0.84    HB/Platelet 14.2/167 02/13/19   HIV +    AFP 73.6    TSH 0.51 09/11/08   comorbidities      tobacco     alcohol     drug     APRI Score 1.563  Interpretation: In a meta-analysis of 40 studies, investigators concluded that an APRI score greater than 1.0 had a sensitivity of 76% and specificity of 72% for predicting cirrhosis. In addition, they concluded that an APRI score greater than 0.7 had a sensitivity of 77% and specificity of 72% for predicting significant hepatic fibrosis.1 For detection of cirrhosis, using an APRI cutoff score of 2.0 was more specific (91%) but less sensitive (46%). The lower the APRI score (less than 0.5), the greater the negative predictive value (and ability to rule out cirrhosis) and the higher the value (greater than 1.5) the greater the positive predictive value (and ability to rule in cirrhosis); midrange values are less helpful. The APRI alone is likely not sufficiently sensitive to rule out significant disease. Some evidence suggests that the use of multiple indices in combination (such as APRI plus FibroTest) or an algorithmic approach may result in higher diagnostic accuracy than using APRI alone  FIB 4 score 2.97  Using a lower cutoff value of 1.45, a FIB-4 score <1.45 had a negative predictive value of 90% for advanced fibrosis (Ishak fibrosis score 4-6 which includes early bridging fibrosis to cirrhosis). In contrast, a FIB-4 >3.25 would have a 97% specificity and a positive predictive value of 65% for advanced fibrosis. In the patient cohort in which this formula was first validated, at least 70% patients had values <1.45 or >3.25. Authors argued that these individuals could potentially have avoided  liver biopsy with an overall accuracy of 86%.  Current meds     Ultrasound Elastography Median kPa: 4.6 Normal  appearance of the liver, spleen, pancreas and both kidneys. 02/23/19 Diagnostic category:  ?5 kPa: high probability of being normal

## 2019-03-14 ENCOUNTER — Other Ambulatory Visit
Admission: RE | Admit: 2019-03-14 | Discharge: 2019-03-14 | Disposition: A | Discharge status: Home / Self Care | Payer: Disability Insurance | Attending: Family Medicine | Admitting: Family Medicine

## 2019-03-14 ENCOUNTER — Other Ambulatory Visit: Payer: Self-pay

## 2019-03-14 DIAGNOSIS — Z21 Asymptomatic human immunodeficiency virus [HIV] infection status: Secondary | ICD-10-CM | POA: Insufficient documentation

## 2019-03-15 ENCOUNTER — Ambulatory Visit: Payer: Self-pay | Admitting: Surgery

## 2019-03-15 LAB — HELPER T-LYMPH-CD4 (ARMC ONLY)
% CD 4 Pos. Lymph.: 45.7 % (ref 30.8–58.5)
Absolute CD 4 Helper: 640 /uL (ref 359–1519)
Basophils Absolute: 0 10*3/uL (ref 0.0–0.2)
Basos: 1 %
EOS (ABSOLUTE): 0.1 10*3/uL (ref 0.0–0.4)
Eos: 2 %
Hematocrit: 40.1 % (ref 34.0–46.6)
Hemoglobin: 13.4 g/dL (ref 11.1–15.9)
Immature Grans (Abs): 0 10*3/uL (ref 0.0–0.1)
Immature Granulocytes: 0 %
Lymphocytes Absolute: 1.4 10*3/uL (ref 0.7–3.1)
Lymphs: 40 %
MCH: 29.2 pg (ref 26.6–33.0)
MCHC: 33.4 g/dL (ref 31.5–35.7)
MCV: 87 fL (ref 79–97)
Monocytes Absolute: 0.3 10*3/uL (ref 0.1–0.9)
Monocytes: 8 %
Neutrophils Absolute: 1.6 10*3/uL (ref 1.4–7.0)
Neutrophils: 49 %
Platelets: 165 10*3/uL (ref 150–450)
RBC: 4.59 x10E6/uL (ref 3.77–5.28)
RDW: 14.7 % (ref 11.7–15.4)
WBC: 3.3 10*3/uL — ABNORMAL LOW (ref 3.4–10.8)

## 2019-03-15 NOTE — H&P (View-Only) (Signed)
Subjective:   CC: Umbilical hernia without obstruction and without gangrene [K42.9]   HPI:  Teresa [name] is a 48 y.o. female who is here for followup for above.  Status post lap salpingectomy and concurrent primary umbilical hernia repair over 8 months ago.  Patient states around 2 months ago she started noticing white discoloration around the incision sites.  Few weeks ago she started noticing increasing bulge at the former umbilical hernia repair site.  Now to the point where it is constantly giving her discomfort especially with pressure.  Around that time she had an episode of coughing and vomiting that put undue stress in the area. .     Current Medications: has a current medication list which includes the following prescription(s): abacavir-dolutegravir-lamivudine, bupropion, butalbital-acetaminophen-caffeine, emgality syringe, lisinopril, meloxicam, nortriptyline, sertraline, and sumatriptan.  Allergies:      Allergies  Allergen Reactions  . Naprosyn [Naproxen] Hives    ROS: General: Denies weight loss, weight gain, fatigue, fevers, chills, and night sweats. Heart: Denies chest pain, palpitations, racing heart, irregular heartbeat, leg pain or swelling, and decreased activity tolerance. Respiratory: Denies breathing difficulty, shortness of breath, wheezing, cough, and sputum. GI: Denies change in appetite, heartburn, nausea, vomiting, constipation, diarrhea, and blood in stool. GU: Denies difficulty urinating, pain with urinating, urgency, frequency, blood in urine    Objective:   BP (!) 176/106   Pulse 80   Ht 170.2 cm (5\' 7" )   Wt 79.4 kg (175 lb)   LMP 06/10/2018 (Exact Date)   BMI 27.41 kg/m   Constitutional :  alert, appears stated age, cooperative and no distress  Gastrointestinal: Soft, no guarding, small, slightly tender but easily reducible umbilical hernia noted.  Blotchy white skin discoloration noted around incision site at the umbilical area, and  also at one of the port sites in the right lower quadrant.   Musculoskeletal: Steady gait and movement  Skin: Cool and moist, incision clean, dry, intact.  No erythema, induration or drainage to indicate infection.    Psychiatric: Normal affect, non-agitated, not confused       LABS:  N/A   RADS: N/A  Assessment:      Umbilical hernia without obstruction and without gangrene [K42.9], recurrent  Plan:     Discussed the risk of surgery including recurrence, which can be up to 50% in the case of incisional or complex hernias, possible use of prosthetic materials (mesh) and the increased risk of mesh infxn if used, bleeding, chronic pain, post-op infxn, post-op SBO or ileus, and possible re-operation to address said risks. The risks of general anesthetic, if used, includes MI, CVA, sudden death or even reaction to anesthetic medications also discussed. Alternatives include continued observation.  Benefits include possible symptom relief, prevention of incarceration, strangulation, enlargement in size over time, and the risk of emergency surgery in the face of strangulation.   Typical post-op recovery time of 3-5 days with 2 weeks of activity restrictions were also discussed.  ED return precautions given for sudden increase in pain, size of hernia with accompanying fever, nausea, and/or vomiting.  The patient verbalized understanding and all questions were answered to the patient's satisfaction.   2. Patient has elected to proceed with surgical treatment. Procedure will be scheduled.  Robotic assisted laparoscopic despite size of hernia, since this is a recurrence of a primary repair.  Written consent was obtained.  Skin discoloration likely from kenalog injection from previous surgery.  Will reach out to GYN provider to see if any remedies available.  Electronically signed by Benjamine Sprague, DO on 03/14/2019 10:58 AM

## 2019-03-15 NOTE — H&P (Signed)
Subjective:   CC: Umbilical hernia without obstruction and without gangrene [K42.9]   HPI:  Teresa [name] is a 48 y.o. female who is here for followup for above.  Status post lap salpingectomy and concurrent primary umbilical hernia repair over 8 months ago.  Patient states around 2 months ago she started noticing white discoloration around the incision sites.  Few weeks ago she started noticing increasing bulge at the former umbilical hernia repair site.  Now to the point where it is constantly giving her discomfort especially with pressure.  Around that time she had an episode of coughing and vomiting that put undue stress in the area. .     Current Medications: has a current medication list which includes the following prescription(s): abacavir-dolutegravir-lamivudine, bupropion, butalbital-acetaminophen-caffeine, emgality syringe, lisinopril, meloxicam, nortriptyline, sertraline, and sumatriptan.  Allergies:      Allergies  Allergen Reactions  . Naprosyn [Naproxen] Hives    ROS: General: Denies weight loss, weight gain, fatigue, fevers, chills, and night sweats. Heart: Denies chest pain, palpitations, racing heart, irregular heartbeat, leg pain or swelling, and decreased activity tolerance. Respiratory: Denies breathing difficulty, shortness of breath, wheezing, cough, and sputum. GI: Denies change in appetite, heartburn, nausea, vomiting, constipation, diarrhea, and blood in stool. GU: Denies difficulty urinating, pain with urinating, urgency, frequency, blood in urine    Objective:   BP (!) 176/106   Pulse 80   Ht 170.2 cm (5\' 7" )   Wt 79.4 kg (175 lb)   LMP 06/10/2018 (Exact Date)   BMI 27.41 kg/m   Constitutional :  alert, appears stated age, cooperative and no distress  Gastrointestinal: Soft, no guarding, small, slightly tender but easily reducible umbilical hernia noted.  Blotchy white skin discoloration noted around incision site at the umbilical area, and  also at one of the port sites in the right lower quadrant.   Musculoskeletal: Steady gait and movement  Skin: Cool and moist, incision clean, dry, intact.  No erythema, induration or drainage to indicate infection.    Psychiatric: Normal affect, non-agitated, not confused       LABS:  N/A   RADS: N/A  Assessment:      Umbilical hernia without obstruction and without gangrene [K42.9], recurrent  Plan:     Discussed the risk of surgery including recurrence, which can be up to 50% in the case of incisional or complex hernias, possible use of prosthetic materials (mesh) and the increased risk of mesh infxn if used, bleeding, chronic pain, post-op infxn, post-op SBO or ileus, and possible re-operation to address said risks. The risks of general anesthetic, if used, includes MI, CVA, sudden death or even reaction to anesthetic medications also discussed. Alternatives include continued observation.  Benefits include possible symptom relief, prevention of incarceration, strangulation, enlargement in size over time, and the risk of emergency surgery in the face of strangulation.   Typical post-op recovery time of 3-5 days with 2 weeks of activity restrictions were also discussed.  ED return precautions given for sudden increase in pain, size of hernia with accompanying fever, nausea, and/or vomiting.  The patient verbalized understanding and all questions were answered to the patient's satisfaction.   2. Patient has elected to proceed with surgical treatment. Procedure will be scheduled.  Robotic assisted laparoscopic despite size of hernia, since this is a recurrence of a primary repair.  Written consent was obtained.  Skin discoloration likely from kenalog injection from previous surgery.  Will reach out to GYN provider to see if any remedies available.  Electronically signed by Benjamine Sprague, DO on 03/14/2019 10:58 AM

## 2019-03-28 ENCOUNTER — Other Ambulatory Visit: Payer: Self-pay

## 2019-03-28 ENCOUNTER — Encounter
Admission: RE | Admit: 2019-03-28 | Discharge: 2019-03-28 | Disposition: A | Discharge status: Home / Self Care | Payer: Medicaid Other | Source: Ambulatory Visit | Attending: Surgery | Admitting: Surgery

## 2019-03-28 DIAGNOSIS — I10 Essential (primary) hypertension: Secondary | ICD-10-CM | POA: Insufficient documentation

## 2019-03-28 DIAGNOSIS — Z01810 Encounter for preprocedural cardiovascular examination: Secondary | ICD-10-CM | POA: Insufficient documentation

## 2019-03-28 NOTE — Patient Instructions (Signed)
Your procedure is scheduled on: Friday 04/06/19.  Report to DAY SURGERY DEPARTMENT LOCATED ON 2ND FLOOR MEDICAL MALL ENTRANCE. To find out your arrival time please call [phone] between 1PM - 3PM on Thursday 04/05/19.   Remember: Instructions that are not followed completely may result in serious medical risk, up to and including death, or upon the discretion of your surgeon and anesthesiologist your surgery may need to be rescheduled.      _X__ 1. Do not eat food after midnight the night before your procedure.                 No gum chewing or hard candies. You may drink clear liquids up to 2 hours                 before you are scheduled to arrive for your surgery- DO NOT drink clear                 liquids within 2 hours of the start of your surgery.                 Clear Liquids include:  water, apple juice without pulp, clear carbohydrate                 drink such as Clearfast or Gatorade, Black Coffee or Tea (Do not add                 milk or creamer to coffee or tea).   __X__2.  On the morning of surgery brush your teeth with toothpaste and water, you may rinse your mouth with mouthwash if you wish.  Do not swallow any toothpaste or mouthwash.       _X__ 3.  No Alcohol for 24 hours before or after surgery.     _X__ 4.  Do Not Smoke or use e-cigarettes For 24 Hours Prior to Your Surgery.                 Do not use any chewable tobacco products for at least 6 hours prior to                 surgery.    __X__5.  Notify your doctor if there is any change in your medical condition      (cold, fever, infections).       Do not wear jewelry, make-up, hairpins, clips or nail polish. Do not wear lotions, powders, or perfumes.  Do not shave 48 hours prior to surgery. Men may shave face and neck. Do not bring valuables to the hospital.      [facility] is not responsible for any belongings or valuables.    Contacts, dentures/partials or body piercings may not be worn  into surgery. Bring a case for your contacts, glasses or hearing aids, a denture cup will be supplied.    Patients discharged the day of surgery will not be allowed to drive home.     Please read over the following fact sheets that you were given:   MRSA Information   __X__ Take these medicines the morning of surgery with A SIP OF WATER:     1. abacavir-dolutegravir-lamiVUDine (TRIUMEQ) 600-50-300 MG tablet  2. albuterol (PROAIR HFA) 108 (90 Base) MCG/ACT inhaler  3. buPROPion (WELLBUTRIN) 75 MG tablet  4. butalbital-acetaminophen-caffeine (FIORICET) 50-325-40 MG tablet if needed   5. SUMAtriptan (IMITREX) 100 MG tablet if needed  6.     __X__ Use CHG Soap as directed  _ X___ Use inhalers on the day of surgery. Also bring the inhaler with you to the hospital on the morning of surgery.    __X__ Stop Anti-inflammatories 7 days before surgery such as Advil, Ibuprofen, Motrin, BC or Goodies Powder, Naprosyn, Naproxen, Aleve, Aspirin, Meloxicam. May take Tylenol if needed for pain or discomfort.     __X__ Don't start taking any new herbal supplements.

## 2019-03-29 ENCOUNTER — Telehealth: Payer: Self-pay | Admitting: Infectious Diseases

## 2019-03-29 NOTE — Telephone Encounter (Signed)
Pt called regarding voicemail left for her yesterday.  Requesting call back when get chance.  Thanks

## 2019-04-02 DIAGNOSIS — M2550 Pain in unspecified joint: Secondary | ICD-10-CM | POA: Insufficient documentation

## 2019-04-03 ENCOUNTER — Other Ambulatory Visit
Admission: RE | Admit: 2019-04-03 | Discharge: 2019-04-03 | Disposition: A | Discharge status: Home / Self Care | Payer: Medicaid Other | Source: Ambulatory Visit | Attending: Surgery | Admitting: Surgery

## 2019-04-03 ENCOUNTER — Telehealth: Payer: Medicaid Other | Admitting: Nurse Practitioner

## 2019-04-03 DIAGNOSIS — R682 Dry mouth, unspecified: Secondary | ICD-10-CM

## 2019-04-03 DIAGNOSIS — Z20828 Contact with and (suspected) exposure to other viral communicable diseases: Secondary | ICD-10-CM | POA: Insufficient documentation

## 2019-04-03 DIAGNOSIS — Z01812 Encounter for preprocedural laboratory examination: Secondary | ICD-10-CM | POA: Insufficient documentation

## 2019-04-03 LAB — SARS CORONAVIRUS 2 (TAT 6-24 HRS): SARS Coronavirus 2: NEGATIVE

## 2019-04-03 NOTE — Progress Notes (Signed)
Based on what you shared with me, I feel your condition warrants further evaluation and I recommend that you be seen for a face to face visit.  Please contact your primary care physician practice to be seen. Many offices offer virtual options to be seen via video if you are not comfortable going in person to a medical facility at this time.  They need to look at the roof of your mouth and see what is going on. If itr is the dry mouth thatis bothering you all you can do is drink lots of liquids and suck on candy.  If you do not have a PCP, [facility] offers a free physician referral service available at [phone]. Our trained staff has the experience, knowledge and resources to put you in touch with a physician who is right for you.   You also have the option of a video visit through [email or web address]  If you are having a true medical emergency please call 911.  NOTE: If you entered your credit card information for this eVisit, you will not be charged. You may see a "hold" on your card for the $35 but that hold will drop off and you will not have a charge processed.  Your e-visit answers were reviewed by a board certified advanced clinical practitioner to complete your personal care plan.  Thank you for using e-Visits.

## 2019-04-04 ENCOUNTER — Telehealth: Payer: Medicaid Other | Admitting: Nurse Practitioner

## 2019-04-04 DIAGNOSIS — R682 Dry mouth, unspecified: Secondary | ICD-10-CM

## 2019-04-04 NOTE — Progress Notes (Signed)
Based on what you shared with me it looks like you have dry mouth,that should be evaluated in a face to face office visit. As I stated yesterday, there is mothing that can be done in an evisit. You will need to se your primary care provider for further evaluation.    NOTE: If you entered your credit card information for this eVisit, you will not be charged. You may see a "hold" on your card for the $35 but that hold will drop off and you will not have a charge processed.  If you are having a true medical emergency please call 911.     For an urgent face to face visit, [facility] has four urgent care centers for your convenience:   . [facility]    [phone]                  Get Driving Directions  [id number] [place], [place] [address] . 10 am to 8 pm Monday-Friday . 12 pm to 8 pm Saturday-Sunday   . [name] at [place]                  Get Driving Directions  [id number] [place], [place], [place] [address] . 8 am to 8 pm Monday-Friday . 9 am to 6 pm Saturday . 11 am to 6 pm Sunday   . [facility] at [place]                  Get Driving Directions   [address], [place] [address] . 8 am to 8 pm Monday-Friday . 8 am to 4 pm Saturday-Sunday    . [facility] at [place]                    Get Driving Directions  [id number]  [address], [place], [place] [address]  . Monday-Friday, 12 PM to 6 PM    Your e-visit answers were reviewed by a board certified advanced clinical practitioner to complete your personal care plan.  Thank you for using e-Visits.

## 2019-04-06 ENCOUNTER — Encounter: Payer: Self-pay | Admitting: Surgery

## 2019-04-06 ENCOUNTER — Encounter: Payer: Self-pay | Admitting: Anesthesiology

## 2019-04-06 ENCOUNTER — Other Ambulatory Visit: Payer: Self-pay

## 2019-04-06 ENCOUNTER — Encounter
Admission: RE | Disposition: A | Discharge status: Home / Self Care | Payer: Self-pay | Source: Home / Self Care | Attending: Surgery

## 2019-04-06 ENCOUNTER — Ambulatory Visit
Admission: RE | Admit: 2019-04-06 | Discharge: 2019-04-06 | Disposition: A | Discharge status: Home / Self Care | Payer: Medicaid Other | Attending: Surgery | Admitting: Surgery

## 2019-04-06 DIAGNOSIS — Z5309 Procedure and treatment not carried out because of other contraindication: Secondary | ICD-10-CM | POA: Insufficient documentation

## 2019-04-06 DIAGNOSIS — K429 Umbilical hernia without obstruction or gangrene: Secondary | ICD-10-CM | POA: Insufficient documentation

## 2019-04-06 LAB — URINE DRUG SCREEN, QUALITATIVE (ARMC ONLY)
Amphetamines, Ur Screen: NOT DETECTED
Barbiturates, Ur Screen: POSITIVE — AB
Benzodiazepine, Ur Scrn: NOT DETECTED
Cannabinoid 50 Ng, Ur ~~LOC~~: POSITIVE — AB
Cocaine Metabolite,Ur ~~LOC~~: NOT DETECTED
MDMA (Ecstasy)Ur Screen: NOT DETECTED
Methadone Scn, Ur: NOT DETECTED
Opiate, Ur Screen: NOT DETECTED
Phencyclidine (PCP) Ur S: NOT DETECTED
Tricyclic, Ur Screen: POSITIVE — AB

## 2019-04-06 SURGERY — REPAIR, HERNIA, UMBILICAL, ROBOT-ASSISTED
Anesthesia: General | Site: Abdomen

## 2019-04-06 MED ORDER — CHLORHEXIDINE GLUCONATE CLOTH 2 % EX PADS: 6.0000 | MEDICATED_PAD | Freq: Once | CUTANEOUS | Status: DC

## 2019-04-06 MED ORDER — SODIUM CHLORIDE FLUSH 0.9 % IV SOLN
INTRAVENOUS | Status: AC
  Filled 2019-04-06: qty 10

## 2019-04-06 MED ORDER — MIDAZOLAM HCL 2 MG/2ML IJ SOLN
INTRAMUSCULAR | Status: AC
  Filled 2019-04-06: qty 2

## 2019-04-06 MED ORDER — BUPIVACAINE-EPINEPHRINE (PF) 0.5% -1:200000 IJ SOLN
INTRAMUSCULAR | Status: AC
  Filled 2019-04-06: qty 30

## 2019-04-06 MED ORDER — GABAPENTIN 300 MG PO CAPS: 300.0000 mg | ORAL_CAPSULE | ORAL | Status: DC

## 2019-04-06 MED ORDER — CEFAZOLIN SODIUM-DEXTROSE 2-4 GM/100ML-% IV SOLN
INTRAVENOUS | Status: AC
  Filled 2019-04-06: qty 100

## 2019-04-06 MED ORDER — BUPIVACAINE LIPOSOME 1.3 % IJ SUSP
INTRAMUSCULAR | Status: AC
  Filled 2019-04-06: qty 20

## 2019-04-06 MED ORDER — FENTANYL CITRATE (PF) 100 MCG/2ML IJ SOLN
INTRAMUSCULAR | Status: AC
  Filled 2019-04-06: qty 2

## 2019-04-06 MED ORDER — ROCURONIUM BROMIDE 50 MG/5ML IV SOLN
INTRAVENOUS | Status: AC
  Filled 2019-04-06: qty 1

## 2019-04-06 MED ORDER — CEFAZOLIN SODIUM-DEXTROSE 2-4 GM/100ML-% IV SOLN: 2.0000 g | INTRAVENOUS | Status: DC

## 2019-04-06 MED ORDER — PROPOFOL 10 MG/ML IV BOLUS
INTRAVENOUS | Status: AC
  Filled 2019-04-06: qty 20

## 2019-04-06 MED ORDER — FAMOTIDINE 20 MG PO TABS: 20.0000 mg | ORAL_TABLET | Freq: Once | ORAL | Status: DC

## 2019-04-06 MED ORDER — ACETAMINOPHEN 500 MG PO TABS: 1000.0000 mg | ORAL_TABLET | ORAL | Status: DC

## 2019-04-06 MED ORDER — LIDOCAINE HCL (PF) 2 % IJ SOLN
INTRAMUSCULAR | Status: AC
  Filled 2019-04-06: qty 10

## 2019-04-06 MED ORDER — ACETAMINOPHEN 500 MG PO TABS
ORAL_TABLET | ORAL | Status: AC
  Filled 2019-04-06: qty 2

## 2019-04-06 MED ORDER — LACTATED RINGERS IV SOLN: INTRAVENOUS | Status: DC

## 2019-04-06 MED ORDER — GABAPENTIN 300 MG PO CAPS
ORAL_CAPSULE | ORAL | Status: AC
  Filled 2019-04-06: qty 1

## 2019-04-06 MED ORDER — FAMOTIDINE 20 MG PO TABS
ORAL_TABLET | ORAL | Status: AC
  Filled 2019-04-06: qty 1

## 2019-04-06 SURGICAL SUPPLY — 54 items
BLADE SURG SZ11 CARB STEEL (BLADE) IMPLANT
CANISTER SUCT 1200ML W/VALVE (MISCELLANEOUS) IMPLANT
CANNULA REDUC XI 12-8 STAPL (CANNULA)
CANNULA REDUCER 12-8 DVNC XI (CANNULA) IMPLANT
CHLORAPREP W/TINT 26 (MISCELLANEOUS) IMPLANT
COVER TIP SHEARS 8 DVNC (MISCELLANEOUS) IMPLANT
COVER TIP SHEARS 8MM DA VINCI (MISCELLANEOUS)
COVER WAND RF STERILE (DRAPES) IMPLANT
DEFOGGER SCOPE WARMER CLEARIFY (MISCELLANEOUS) IMPLANT
DERMABOND ADVANCED (GAUZE/BANDAGES/DRESSINGS)
DERMABOND ADVANCED .7 DNX12 (GAUZE/BANDAGES/DRESSINGS) IMPLANT
DRAPE 3/4 80X56 (DRAPES) IMPLANT
DRAPE ARM DVNC X/XI (DISPOSABLE) ×4 IMPLANT
DRAPE COLUMN DVNC XI (DISPOSABLE) IMPLANT
DRAPE DA VINCI XI ARM (DISPOSABLE) ×4
DRAPE DA VINCI XI COLUMN (DISPOSABLE)
ELECT CAUTERY BLADE 6.4 (BLADE) IMPLANT
ELECT REM PT RETURN 9FT ADLT (ELECTROSURGICAL)
ELECTRODE REM PT RTRN 9FT ADLT (ELECTROSURGICAL) IMPLANT
ETHIBOND 2 0 GREEN CT 2 30IN (SUTURE) IMPLANT
GLOVE BIOGEL PI IND STRL 7.0 (GLOVE) IMPLANT
GLOVE BIOGEL PI INDICATOR 7.0 (GLOVE)
GLOVE SURG SYN 6.5 ES PF (GLOVE) IMPLANT
GOWN STRL REUS W/ TWL LRG LVL3 (GOWN DISPOSABLE) IMPLANT
GOWN STRL REUS W/TWL LRG LVL3 (GOWN DISPOSABLE)
GRASPER SUT TROCAR 14GX15 (MISCELLANEOUS) IMPLANT
IRRIGATOR SUCT 8 DISP DVNC XI (IRRIGATION / IRRIGATOR) IMPLANT
IRRIGATOR SUCTION 8MM XI DISP (IRRIGATION / IRRIGATOR)
IV NS 1000ML (IV SOLUTION)
IV NS 1000ML BAXH (IV SOLUTION) IMPLANT
KIT PINK PAD W/HEAD ARE REST (MISCELLANEOUS)
KIT PINK PAD W/HEAD ARM REST (MISCELLANEOUS) IMPLANT
LABEL OR SOLS (LABEL) IMPLANT
NEEDLE HYPO 22GX1.5 SAFETY (NEEDLE) IMPLANT
NEEDLE INSUFFLATION 14GA 120MM (NEEDLE) IMPLANT
OBTURATOR OPTICAL STANDARD 8MM (TROCAR)
OBTURATOR OPTICAL STND 8 DVNC (TROCAR)
OBTURATOR OPTICALSTD 8 DVNC (TROCAR) IMPLANT
PACK LAP CHOLECYSTECTOMY (MISCELLANEOUS) IMPLANT
PENCIL ELECTRO HAND CTR (MISCELLANEOUS) IMPLANT
SEAL CANN UNIV 5-8 DVNC XI (MISCELLANEOUS) IMPLANT
SEAL XI 5MM-8MM UNIVERSAL (MISCELLANEOUS)
SOLUTION ELECTROLUBE (MISCELLANEOUS) IMPLANT
STAPLER CANNULA SEAL DVNC XI (STAPLE) IMPLANT
STAPLER CANNULA SEAL XI (STAPLE)
SUT DVC VLOC 3-0 CL 6 P-12 (SUTURE) IMPLANT
SUT MNCRL AB 4-0 PS2 18 (SUTURE) IMPLANT
SUT VIC AB 3-0 SH 27 (SUTURE)
SUT VIC AB 3-0 SH 27X BRD (SUTURE) IMPLANT
SUT VICRYL 0 AB UR-6 (SUTURE) IMPLANT
SYR 30ML LL (SYRINGE) IMPLANT
TRAY FOLEY MTR SLVR 16FR STAT (SET/KITS/TRAYS/PACK) IMPLANT
TROCAR XCEL NON-BLD 5MMX100MML (ENDOMECHANICALS) IMPLANT
TUBING EVAC SMOKE HEATED PNEUM (TUBING) IMPLANT

## 2019-04-06 NOTE — OR Nursing (Signed)
Procedure cancelled per Dr. Randa Lynn due to patient being treated for thrush and still symptomatic. Dr. Randa Lynn assessed at bedside. Dr. Lysle Pearl made aware per anesthesia. Patient verbalizes understanding and to follow up with her PCP.

## 2019-04-06 NOTE — Interval H&P Note (Signed)
History and Physical Interval Note:  04/06/2019 9:21 AM  Teresa [name]  has presented today for surgery, with the diagnosis of [id number] Recurrent umbilical hernia.  The various methods of treatment have been discussed with the patient and family. After consideration of risks, benefits and other options for treatment, the patient has consented to  Procedure(s): XI [place] (N/A) as a surgical intervention.  The patient's history has been reviewed, patient examined, no change in status, stable for surgery.  I have reviewed the patient's chart and labs.  Questions were answered to the patient's satisfaction.     Alajia Schmelzer Lysle Pearl

## 2019-04-06 NOTE — OR Nursing (Signed)
Spoke with Dr. Lysle Pearl about pt elevated BP at discharge, recommendations to follow up .Pt states she has also had BP medication changes in the past 2wks.  PT in NAD at time of departure.

## 2019-04-09 ENCOUNTER — Other Ambulatory Visit: Payer: Self-pay

## 2019-04-09 ENCOUNTER — Encounter: Payer: Self-pay | Admitting: Infectious Diseases

## 2019-04-09 DIAGNOSIS — B20 Human immunodeficiency virus [HIV] disease: Secondary | ICD-10-CM

## 2019-04-09 MED ORDER — TRIUMEQ 600-50-300 MG PO TABS: 1.0000 | ORAL_TABLET | Freq: Every day | ORAL | 3 refills | Status: DC

## 2019-04-09 NOTE — Telephone Encounter (Signed)
Called patient twice and left a message

## 2019-04-11 ENCOUNTER — Telehealth: Payer: Self-pay | Admitting: Gastroenterology

## 2019-04-11 NOTE — Telephone Encounter (Signed)
Patient was referred by DR Margaretha Glassing.

## 2019-04-11 NOTE — Telephone Encounter (Signed)
I called patient to offer her a virtual appointment for 04-18-19 @ 10:45 for abnormal LFTs Per Sherald Hess

## 2019-04-18 ENCOUNTER — Telehealth: Payer: Self-pay

## 2019-04-18 ENCOUNTER — Other Ambulatory Visit: Payer: Self-pay

## 2019-04-18 ENCOUNTER — Ambulatory Visit (INDEPENDENT_AMBULATORY_CARE_PROVIDER_SITE_OTHER): Payer: Medicaid Other | Admitting: Gastroenterology

## 2019-04-18 DIAGNOSIS — Z21 Asymptomatic human immunodeficiency virus [HIV] infection status: Secondary | ICD-10-CM | POA: Diagnosis not present

## 2019-04-18 DIAGNOSIS — R772 Abnormality of alphafetoprotein: Secondary | ICD-10-CM

## 2019-04-18 DIAGNOSIS — R748 Abnormal levels of other serum enzymes: Secondary | ICD-10-CM

## 2019-04-18 DIAGNOSIS — B1920 Unspecified viral hepatitis C without hepatic coma: Secondary | ICD-10-CM

## 2019-04-18 DIAGNOSIS — B182 Chronic viral hepatitis C: Secondary | ICD-10-CM

## 2019-04-18 NOTE — Telephone Encounter (Signed)
Pt returned call. Pre-charting is complete. Pt confirmed that she has the link for the virtual visit.

## 2019-04-18 NOTE — Progress Notes (Signed)
Teresa [name]  [address]  [place]  [place], [place] [address]  Main: [phone]  Fax: [phone]   Gastroenterology Consultation  Referring Provider:    Dr [name] Primary Care Physician:  Baxter Hire, MD Reason for Consultation:     Abnormal AFP          HPI:   Virtual Visit via video  Note  I connected with patient on 04/18/19 at 10:45 AM EST by video  and verified that I am speaking with the correct person using two identifiers.   I discussed the limitations, risks, security and privacy concerns of performing an evaluation and management service by video and the availability of in person appointments. I also discussed with the patient that there may be a patient responsible charge related to this service. The patient expressed understanding and agreed to proceed.  Location of the patient: Home Location of provider: Home Participating persons: Patient and provider only   History of Present Illness: Chief Complaint  Patient presents with  . Elevated Hepatic Enzymes     Teresa [name] is a 48 y.o. y/o female referred by Dr [name] for elevated AFP.  She follows with Dr. [name]  for  HIV and hepatitis C coinfection.  She is on treatment for HIV but not on any hepatitis C treatment yet.  She underwent an ultrasound elastography in October 2020 which showed normal appearance of the liver spleen.  No clear evidence of liver cirrhosis.  Positive hepatitis C viral load of 16.7 million in October 2020.,  INR 1.0.  Hepatitis C genotype 1a.  aFP noted to be 73.6.  AST 107, ALT 94 and alkaline phosphatase of 136.  No prior alpha-fetoprotein levels to compare with.  Denies any excess alcohol consumption, used to take atleast 4 tylenols a day but stopped 1 month back. Denies any OTC meds except vitamin D and no herbal supplements/ Denies any abdominal pain.  Past Medical History:  Diagnosis Date  . Anemia    iron infusions x 2  . Anxiety   . Arthritis     Neck, left shoulder  . Asthma    uses inhaler 2-3 x week  . Bipolar disorder ([place])   . Depression   . Endometriosis   . Fibroids   . GERD (gastroesophageal reflux disease)   . Headache    Migraines  . Hepatitis 2009   Hep C  . HIV infection ([place])   . Hypertension   . Pancreatitis   . Seasonal allergies   . SVD (spontaneous vaginal delivery)    x 1    Past Surgical History:  Procedure Laterality Date  . ABDOMINAL HYSTERECTOMY    . CESAREAN SECTION     x2  . DILATION AND CURETTAGE OF UTERUS     x 2 - TAB  . HAND SURGERY Right    2 pins in wrist  . HYSTEROSCOPY N/A 10/11/2016   Procedure: HYSTEROSCOPY WITH HYDROTHERMAL ABLATION;  Surgeon: Donnamae Jude, MD;  Location: [facility];  Service: Gynecology;  Laterality: N/A;  Caryl Pina the HTA rep will be here.  Confirmed on 09/02/16.   Marland Kitchen LAPAROSCOPIC BILATERAL SALPINGECTOMY Bilateral 07/11/2018   Procedure: LAPAROSCOPIC BILATERAL SALPINGECTOMY;  Surgeon: Ward, Honor Loh, MD;  Location: [facility];  Service: Gynecology;  Laterality: Bilateral;  . LAPAROSCOPIC HYSTERECTOMY N/A 07/11/2018   Procedure: HYSTERECTOMY TOTAL LAPAROSCOPIC;  Surgeon: Ward, Honor Loh, MD;  Location: [facility];  Service: Gynecology;  Laterality: N/A;  . LAPAROSCOPIC TUBAL LIGATION  Bilateral 10/11/2016   Procedure: LAPAROSCOPIC TUBAL LIGATION WITH FILSHIE CLIPS;  Surgeon: Donnamae Jude, MD;  Location: [facility];  Service: Gynecology;  Laterality: Bilateral;  . MOUTH SURGERY    . UMBILICAL HERNIA REPAIR N/A 07/11/2018   Procedure: LAPAROSCOPIC UMBILICAL HERNIA REPAIR;  Surgeon: Benjamine Sprague, DO;  Location: [facility];  Service: General;  Laterality: N/A;  . WISDOM TOOTH EXTRACTION      Prior to Admission medications   Medication Sig Start Date End Date Taking? Authorizing Provider  abacavir-dolutegravir-lamiVUDine (TRIUMEQ) 600-50-300 MG tablet Take 1 tablet by mouth daily. 04/09/19   Tsosie Billing, MD  albuterol (PROAIR HFA) 108 (90 Base) MCG/ACT inhaler Inhale 2  puffs into the lungs every 6 (six) hours as needed for wheezing or shortness of breath.     [provider]  amLODipine (NORVASC) 5 MG tablet Take 1 tablet (5 mg total) by mouth daily. Patient not taking: Reported on 03/15/2019 02/13/19   Tsosie Billing, MD  buPROPion (WELLBUTRIN) 75 MG tablet Take 75 mg by mouth 2 (two) times daily.    [provider]  butalbital-acetaminophen-caffeine (FIORICET) 50-325-40 MG tablet Take 1 tablet by mouth every 4 (four) hours as needed for migraine.     [provider]  cetirizine (ZYRTEC) 10 MG tablet Take 10 mg by mouth daily as needed for allergies.     [provider]  diphenhydramine-acetaminophen (TYLENOL PM) 25-500 MG TABS tablet Take 2 tablets by mouth at bedtime as needed (sleep).    [provider]  fluconazole (DIFLUCAN) 100 MG tablet Take by mouth. 04/11/19 04/18/19  [provider]  fluticasone (FLONASE) 50 MCG/ACT nasal spray Place 1 spray into both nostrils daily as needed for allergies or rhinitis.     [provider]  Galcanezumab-gnlm (EMGALITY) 120 MG/ML SOSY Inject 120 mg into the skin every 28 (twenty-eight) days.    [provider]  lisinopril (ZESTRIL) 10 MG tablet Take 10 mg by mouth daily.    [provider]  meloxicam (MOBIC) 7.5 MG tablet Take 7.5 mg by mouth daily.    [provider]  Multiple Vitamin (MULTIVITAMIN WITH MINERALS) TABS tablet Take 2 tablets by mouth daily.    [provider]  nortriptyline (PAMELOR) 10 MG capsule Take 40 mg by mouth at bedtime.    [provider]  sertraline (ZOLOFT) 100 MG tablet Take 100 mg by mouth at bedtime.    [provider]  SUMAtriptan (IMITREX) 100 MG tablet Take 100 mg by mouth every 2 (two) hours as needed for migraine. May repeat in 2 hours if headache persists or recurs.     [provider]    Family History  Problem Relation Age of Onset  . Breast cancer  Paternal Grandmother   . Breast cancer Cousin      Social History   Tobacco Use  . Smoking status: Former Smoker    Packs/day: 0.25    Years: 25.00    Pack years: 6.25    Types: Cigarettes  . Smokeless tobacco: Never Used  . Tobacco comment: uses vapor occasionally  Substance Use Topics  . Alcohol use: Yes    Comment: wine occ  . Drug use: Yes    Types: Marijuana    Comment: 2-3 x per month    Allergies as of 04/18/2019 - Review Complete 04/18/2019  Allergen Reaction Noted  . Naproxen sodium Hives 11/12/2014    Review of Systems:    All systems reviewed and negative except where  noted in HPI. General Appearance:    Alert, cooperative, no distress, appears stated age  Head:    Normocephalic, without obvious abnormality, atraumatic  Eyes:    PERRL, conjunctiva/corneas clear,  Ears:    Grossly normal hearing    Neurologic:   Grossly appears normal     Observations/Objective:  Labs: CBC    Component Value Date/Time   WBC 3.3 (L) 03/14/2019 0857   WBC 4.0 07/05/2018 1441   RBC 4.59 03/14/2019 0857   RBC 4.51 07/05/2018 1441   HGB 13.4 03/14/2019 0857   HCT 40.1 03/14/2019 0857   PLT 165 03/14/2019 0857   MCV 87 03/14/2019 0857   MCH 29.2 03/14/2019 0857   MCH 26.8 07/05/2018 1441   MCHC 33.4 03/14/2019 0857   MCHC 32.9 07/05/2018 1441   RDW 14.7 03/14/2019 0857   LYMPHSABS 1.4 03/14/2019 0857   MONOABS 0.3 06/18/2016 0940   EOSABS 0.1 03/14/2019 0857   BASOSABS 0.0 03/14/2019 0857   CMP     Component Value Date/Time   NA 137 02/13/2019 1137   K 3.8 02/13/2019 1137   CL 100 02/13/2019 1137   CO2 30 02/13/2019 1137   GLUCOSE 98 02/13/2019 1137   BUN 9 02/13/2019 1137   CREATININE 0.84 02/13/2019 1137   CALCIUM 8.9 02/13/2019 1137   PROT 7.5 02/13/2019 1137   ALBUMIN 4.0 02/13/2019 1137   AST 107 (H) 02/13/2019 1137   ALT 94 (H) 02/13/2019 1137   ALKPHOS 136 (H) 02/13/2019 1137   BILITOT 0.8 02/13/2019 1137   GFRNONAA >60 02/13/2019 1137   GFRAA  >60 02/13/2019 1137    Imaging Studies: No results found.  Assessment and Plan:   DEAJA SCAIFE is a 48 y.o. y/o female with a history of HIV and chronic hepatitis C treatment nave.  No biochemical or radiological evidence of liver cirrhosis.  I have been consulted for an elevated AFP noted incidentally on labs.Elevated alpha-fetoprotein levels may be seen in chronic liver disease without HCC.  Per her recent labs there is evidence of hepatitis with elevated AST and ALT levels.  It is possible that the alpha-fetoprotein levels may be elevated due to the same+/- effects of HIV/Hep C +/- excess tyelenol.    Rarely elevated alpha-fetoprotein levels may be seen in gastric cancer.  Plan 1.  Obtain MRI liver mass protocol to rule out any underlying HCC which may not have been picked up on ultrasound 2.  Check GGT and AFP levels again.  If it continues to rise and MRI is negative I will consider upper endoscopy to evaluate for gastric cancer   Follow-up in 3 weeks after MRI   I discussed the assessment and treatment plan with the patient. The patient was provided an opportunity to ask questions and all were answered. The patient agreed with the plan and demonstrated an understanding of the instructions.   The patient was advised to call back or seek an in-person evaluation if the symptoms worsen or if the condition fails to improve as anticipated.     Dr Teresa Bellows MD,MRCP [facility]) Gastroenterology/Hepatology Pager: [phone]   Speech recognition software was used to dictate the above note.

## 2019-04-18 NOTE — Telephone Encounter (Signed)
Spoke with pt and informed her that Dr. Vicente Males ordered lab test and explained that she would need to visit her local [facility] collection site or the [facility] hospital lab. I also explained that I will contact radiology to schedule the MRI as planned by Dr. Vicente Males. Pt requests to schedule MRI in late January as she has an upcoming surgery.

## 2019-04-18 NOTE — Telephone Encounter (Signed)
Called pt to pre-chart for today's e-visit with Dr. Anna  Unable to contact. LVM to return call 

## 2019-04-27 ENCOUNTER — Encounter: Payer: Self-pay | Admitting: Emergency Medicine

## 2019-04-27 ENCOUNTER — Emergency Department: Payer: Medicaid Other

## 2019-04-27 ENCOUNTER — Emergency Department
Admission: EM | Admit: 2019-04-27 | Discharge: 2019-04-27 | Disposition: A | Discharge status: Home / Self Care | Payer: Medicaid Other | Attending: Emergency Medicine | Admitting: Emergency Medicine

## 2019-04-27 ENCOUNTER — Other Ambulatory Visit: Payer: Self-pay

## 2019-04-27 DIAGNOSIS — R1031 Right lower quadrant pain: Secondary | ICD-10-CM | POA: Diagnosis present

## 2019-04-27 DIAGNOSIS — J45909 Unspecified asthma, uncomplicated: Secondary | ICD-10-CM | POA: Insufficient documentation

## 2019-04-27 DIAGNOSIS — I10 Essential (primary) hypertension: Secondary | ICD-10-CM | POA: Diagnosis not present

## 2019-04-27 DIAGNOSIS — Z87891 Personal history of nicotine dependence: Secondary | ICD-10-CM | POA: Diagnosis not present

## 2019-04-27 DIAGNOSIS — N83202 Unspecified ovarian cyst, left side: Secondary | ICD-10-CM | POA: Diagnosis not present

## 2019-04-27 DIAGNOSIS — Z3202 Encounter for pregnancy test, result negative: Secondary | ICD-10-CM | POA: Insufficient documentation

## 2019-04-27 DIAGNOSIS — Z21 Asymptomatic human immunodeficiency virus [HIV] infection status: Secondary | ICD-10-CM | POA: Insufficient documentation

## 2019-04-27 LAB — COMPREHENSIVE METABOLIC PANEL
ALT: 80 U/L — ABNORMAL HIGH (ref 0–44)
AST: 91 U/L — ABNORMAL HIGH (ref 15–41)
Albumin: 3.7 g/dL (ref 3.5–5.0)
Alkaline Phosphatase: 115 U/L (ref 38–126)
Anion gap: 10 (ref 5–15)
BUN: 6 mg/dL (ref 6–20)
CO2: 23 mmol/L (ref 22–32)
Calcium: 8.9 mg/dL (ref 8.9–10.3)
Chloride: 103 mmol/L (ref 98–111)
Creatinine, Ser: 0.62 mg/dL (ref 0.44–1.00)
GFR calc Af Amer: >60 mL/min (ref >60)
GFR calc non Af Amer: >60 mL/min (ref >60)
Glucose, Bld: 119 mg/dL — ABNORMAL HIGH (ref 70–99)
Potassium: 4.3 mmol/L (ref 3.5–5.1)
Sodium: 136 mmol/L (ref 135–145)
Total Bilirubin: 1 mg/dL (ref 0.3–1.2)
Total Protein: 8.2 g/dL — ABNORMAL HIGH (ref 6.5–8.1)

## 2019-04-27 LAB — URINALYSIS, COMPLETE (UACMP) WITH MICROSCOPIC
Bacteria, UA: NONE SEEN
Bilirubin Urine: NEGATIVE
Glucose, UA: NEGATIVE mg/dL
Hgb urine dipstick: NEGATIVE
Ketones, ur: NEGATIVE mg/dL
Leukocytes,Ua: NEGATIVE
Nitrite: POSITIVE — AB
Protein, ur: NEGATIVE mg/dL
Specific Gravity, Urine: 1.01 (ref 1.005–1.030)
pH: 5 (ref 5.0–8.0)

## 2019-04-27 LAB — LIPASE, BLOOD: Lipase: 65 U/L — ABNORMAL HIGH (ref 11–51)

## 2019-04-27 LAB — CBC WITH DIFFERENTIAL/PLATELET
Abs Immature Granulocytes: 0.01 10*3/uL (ref 0.00–0.07)
Basophils Absolute: 0 10*3/uL (ref 0.0–0.1)
Basophils Relative: 1 %
Eosinophils Absolute: 0 10*3/uL (ref 0.0–0.5)
Eosinophils Relative: 1 %
HCT: 41.5 % (ref 36.0–46.0)
Hemoglobin: 15 g/dL (ref 12.0–15.0)
Immature Granulocytes: 0 %
Lymphocytes Relative: 39 %
Lymphs Abs: 2 10*3/uL (ref 0.7–4.0)
MCH: 29.9 pg (ref 26.0–34.0)
MCHC: 36.1 g/dL — ABNORMAL HIGH (ref 30.0–36.0)
MCV: 82.8 fL (ref 80.0–100.0)
Monocytes Absolute: 0.3 10*3/uL (ref 0.1–1.0)
Monocytes Relative: 7 %
Neutro Abs: 2.7 10*3/uL (ref 1.7–7.7)
Neutrophils Relative %: 52 %
Platelets: 160 10*3/uL (ref 150–400)
RBC: 5.01 MIL/uL (ref 3.87–5.11)
RDW: 12.6 % (ref 11.5–15.5)
Smear Review: NORMAL
WBC: 5.1 10*3/uL (ref 4.0–10.5)
nRBC: 0 % (ref 0.0–0.2)

## 2019-04-27 LAB — POCT PREGNANCY, URINE: Preg Test, Ur: NEGATIVE

## 2019-04-27 MED ORDER — OXYCODONE HCL 5 MG PO TABS: ORAL_TABLET | ORAL | 0 refills | Status: DC

## 2019-04-27 MED ORDER — IOHEXOL 300 MG/ML  SOLN
100.0000 mL | Freq: Once | INTRAMUSCULAR | Status: AC | PRN
  Administered 2019-04-27: 100 mL via INTRAVENOUS

## 2019-04-27 MED ORDER — MORPHINE SULFATE (PF) 4 MG/ML IV SOLN
4.0000 mg | Freq: Once | INTRAVENOUS | Status: AC
  Administered 2019-04-27: 4 mg via INTRAVENOUS
  Filled 2019-04-27: qty 1

## 2019-04-27 MED ORDER — ONDANSETRON HCL 4 MG/2ML IJ SOLN
4.0000 mg | Freq: Once | INTRAMUSCULAR | Status: AC
  Administered 2019-04-27: 10:00:00 4 mg via INTRAVENOUS
  Filled 2019-04-27: qty 2

## 2019-04-27 NOTE — ED Provider Notes (Signed)
[facility] Emergency Department Provider Note       Time seen: ----------------------------------------- 9:17 AM on 04/27/2019 -----------------------------------------   I have reviewed the triage vital signs and the nursing notes.  HISTORY  Chief Complaint Abdominal Pain   HPI Teresa [name] is a 49 y.o. female with a history of anemia, anxiety, arthritis, bipolar disorder, depression, fibroids, headache, HIV, hypertension, pancreatitis who presents to the ED for abdominal pain since yesterday.  Patient reports pain increases with movement.  Discomfort is 10 out of 10.  She denies fevers, had one episode of vomiting.  Past Medical History:  Diagnosis Date  . Anemia    iron infusions x 2  . Anxiety   . Arthritis    Neck, left shoulder  . Asthma    uses inhaler 2-3 x week  . Bipolar disorder ([place])   . Depression   . Endometriosis   . Fibroids   . GERD (gastroesophageal reflux disease)   . Headache    Migraines  . Hepatitis 2009   Hep C  . HIV infection ([place])   . Hypertension   . Pancreatitis   . Seasonal allergies   . SVD (spontaneous vaginal delivery)    x 1    Patient Active Problem List   Diagnosis Date Noted  . Iron deficiency anemia due to chronic blood loss 03/25/2014  . Abnormal uterine bleeding (AUB) 03/12/2014  . Bipolar affective disorder ([place]) 04/13/2012  . Endometriosis 04/13/2012  . Migraine headache 04/13/2012  . Hepatitis C infection 03/10/2011  . HIV disease ([place]) 03/10/2011    Past Surgical History:  Procedure Laterality Date  . ABDOMINAL HYSTERECTOMY    . CESAREAN SECTION     x2  . DILATION AND CURETTAGE OF UTERUS     x 2 - TAB  . HAND SURGERY Right    2 pins in wrist  . HYSTEROSCOPY N/A 10/11/2016   Procedure: HYSTEROSCOPY WITH HYDROTHERMAL ABLATION;  Surgeon: Donnamae Jude, MD;  Location: [facility];  Service: Gynecology;  Laterality: N/A;  Caryl Pina the [employer] rep will be here.  Confirmed on 09/02/16.   Marland Kitchen  LAPAROSCOPIC BILATERAL SALPINGECTOMY Bilateral 07/11/2018   Procedure: LAPAROSCOPIC BILATERAL SALPINGECTOMY;  Surgeon: Ward, Honor Loh, MD;  Location: [facility];  Service: Gynecology;  Laterality: Bilateral;  . LAPAROSCOPIC HYSTERECTOMY N/A 07/11/2018   Procedure: HYSTERECTOMY TOTAL LAPAROSCOPIC;  Surgeon: Ward, Honor Loh, MD;  Location: [facility];  Service: Gynecology;  Laterality: N/A;  . LAPAROSCOPIC TUBAL LIGATION Bilateral 10/11/2016   Procedure: LAPAROSCOPIC TUBAL LIGATION WITH FILSHIE CLIPS;  Surgeon: Donnamae Jude, MD;  Location: [facility];  Service: Gynecology;  Laterality: Bilateral;  . MOUTH SURGERY    . UMBILICAL HERNIA REPAIR N/A 07/11/2018   Procedure: LAPAROSCOPIC UMBILICAL HERNIA REPAIR;  Surgeon: Benjamine Sprague, DO;  Location: [facility];  Service: General;  Laterality: N/A;  . WISDOM TOOTH EXTRACTION      Allergies Naproxen sodium  Social History Social History   Tobacco Use  . Smoking status: Former Smoker    Packs/day: 0.25    Years: 25.00    Pack years: 6.25    Types: Cigarettes  . Smokeless tobacco: Never Used  . Tobacco comment: uses vapor occasionally  Substance Use Topics  . Alcohol use: Yes    Comment: wine occ  . Drug use: Yes    Types: Marijuana    Comment: 2-3 x per month   Review of Systems Constitutional: Negative for fever. Cardiovascular: Negative for chest pain. Respiratory: Negative for  shortness of breath. Gastrointestinal: Positive for abdominal pain, vomiting Musculoskeletal: Negative for back pain. Skin: Negative for rash. Neurological: Negative for headaches, focal weakness or numbness.  All systems negative/normal/unremarkable except as stated in the HPI  ____________________________________________   PHYSICAL EXAM:  VITAL SIGNS: ED Triage Vitals [04/27/19 0558]  Enc Vitals Group     BP (!) 159/117     Pulse Rate 95     Resp 20     Temp 98.7 F (37.1 C)     Temp Source Oral     SpO2 98 %     Weight 180 lb (81.6 kg)     Height 5'  7" (1.702 m)     Head Circumference      Peak Flow      Pain Score 10     Pain Loc      Pain Edu?      Excl. in [place]?    Constitutional: Alert and oriented. Well appearing and in no distress. Eyes: Conjunctivae are normal. Normal extraocular movements. ENT      Head: Normocephalic and atraumatic.      Nose: No congestion/rhinnorhea.      Mouth/Throat: Mucous membranes are moist.      Neck: No stridor. Cardiovascular: Normal rate, regular rhythm. No murmurs, rubs, or gallops. Respiratory: Normal respiratory effort without tachypnea nor retractions. Breath sounds are clear and equal bilaterally. No wheezes/rales/rhonchi. Gastrointestinal: Right lower quadrant abdominal tenderness, she has a small periumbilical hernia that reduces, previous midline laparotomy scar. Musculoskeletal: Nontender with normal range of motion in extremities. No lower extremity tenderness nor edema. Neurologic:  Normal speech and language. No gross focal neurologic deficits are appreciated.  Skin:  Skin is warm, dry and intact. No rash noted. Psychiatric: Mood and affect are normal. Speech and behavior are normal.  ____________________________________________  ED COURSE:  As part of my medical decision making, I reviewed the following data within the [place] History obtained from family if available, nursing notes, old chart and ekg, as well as notes from prior ED visits. Patient presented for abdominal pain, we will assess with labs and imaging as indicated at this time. Clinical Course as of Apr 26 1157  Fri Apr 27, 2019  1120 Patient states severe right-sided pelvic pain started at 2 PM yesterday.   [JW]  1135 nRBC: 0.0 [JW]    Clinical Course User Index [JW] Earleen Newport, MD   Procedures  Teresa [name] was evaluated in Emergency Department on 04/27/2019 for the symptoms described in the history of present illness. She was evaluated in the context of the global COVID-19  pandemic, which necessitated consideration that the patient might be at risk for infection with the SARS-CoV-2 virus that causes COVID-19. Institutional protocols and algorithms that pertain to the evaluation of patients at risk for COVID-19 are in a state of rapid change based on information released by regulatory bodies including the CDC and federal and state organizations. These policies and algorithms were followed during the patient's care in the ED.  ____________________________________________   LABS (pertinent positives/negatives)  Labs Reviewed  CBC WITH DIFFERENTIAL/PLATELET - Abnormal; Notable for the following components:      Result Value   MCHC 36.1 (*)    All other components within normal limits  COMPREHENSIVE METABOLIC PANEL - Abnormal; Notable for the following components:   Glucose, Bld 119 (*)    Total Protein 8.2 (*)    AST 91 (*)    ALT 80 (*)  All other components within normal limits  LIPASE, BLOOD - Abnormal; Notable for the following components:   Lipase 65 (*)    All other components within normal limits  URINALYSIS, COMPLETE (UACMP) WITH MICROSCOPIC - Abnormal; Notable for the following components:   Color, Urine AMBER (*)    APPearance CLEAR (*)    Nitrite POSITIVE (*)    All other components within normal limits  POCT PREGNANCY, URINE    RADIOLOGY Images were viewed by me  CT the abdomen pelvis with contrast  IMPRESSION:  1. Moderate size fat containing periumbilical hernia.  2. Interval development of 7.3 x 4.5 cm left adnexal mass with  multiple rounded cystic areas. This may represent cystic ovarian  neoplasm, and further evaluation with ultrasound is recommended.  3. Mild amount of free fluid is noted in the pelvis.   CLINICAL DATA: Pelvic pain   EXAM:  TRANSABDOMINAL AND TRANSVAGINAL ULTRASOUND OF PELVIS   DOPPLER ULTRASOUND OF OVARIES   TECHNIQUE:  Both transabdominal and transvaginal ultrasound examinations of the  pelvis were  performed. Transabdominal technique was performed for  global imaging of the pelvis including uterus, ovaries, adnexal  regions, and pelvic cul-de-sac.   It was necessary to proceed with endovaginal exam following the  transabdominal exam to visualize the ovaries. Color and duplex  Doppler ultrasound was utilized to evaluate blood flow to the  ovaries.   COMPARISON: Recent CT of the abdomen and pelvis.   FINDINGS:  Uterus   Surgically removed   Right ovary   Not well visualized   Left ovary   Measurements: 7.4 x 4.9 x 5.3 cm. = volume: 99 mL. Multiple cystic  lesions are noted within the left ovary similar to that seen on the  prior CT examination. Some internal debris is identified.   Pulsed Doppler evaluation of the left ovary demonstrates normal  low-resistance arterial and venous waveforms.   Other findings   Mild free fluid is noted. Some debris is noted within the free fluid  in this could be related to hemorrhage.   IMPRESSION:  Multi cystic lesion within the left ovary with internal debris  likely representing hemorrhage. Possible cystic ovarian neoplasm  deserves consideration although no definitive malignant features are  seen. Follow-up examination is recommended by means of ultrasound in  3 months to assess for stability/regression.       ____________________________________________   DIFFERENTIAL DIAGNOSIS   Hernia, appendicitis, renal colic, UTI, pyelonephritis, ovarian cyst, obstruction unlikely, pancreatitis  FINAL ASSESSMENT AND PLAN  Abdominal pain, left ovarian cystic structure   Plan: The patient had presented for lower abdominal pain. Patient's labs were grossly unremarkable although her lipase was slightly elevated. Patient's imaging was concerning from an ovarian cyst standpoint.  She has a cystic lesion within the left ovary with internal debris likely representing hemorrhage, possible neoplasm. Dr. Leonides Schanz from Ob/Gyn came to evaluate her  and plans to operate on Friday. Patient is comfortable with this plan.    Laurence Aly, MD    Note: This note was generated in part or whole with voice recognition software. Voice recognition is usually quite accurate but there are transcription errors that can and very often do occur. I apologize for any typographical errors that were not detected and corrected.     Earleen Newport, MD 04/27/19 1209

## 2019-04-27 NOTE — ED Notes (Signed)
Per Dr. Vikki Ports ward at bedside, no need for COVID swab at this time as patient is scheduled for surgery X 1 week from now and will have outpatient swab performed.

## 2019-04-27 NOTE — ED Triage Notes (Signed)
Patient ambulatory to triage with steady gait, without difficulty or distress noted, mask in place; pt reports rt lower abd pain since yesterday; st pain increases with movement; last BM 2 days ago

## 2019-04-27 NOTE — ED Notes (Signed)
Pt alert and oriented X 4, stable for discharge. RR even and unlabored, color WNL. Discussed discharge instructions and follow up when appropriate. Instructed to follow up with ER for any life threatening symptoms or concerns that patient or family of patient may have  

## 2019-05-02 ENCOUNTER — Other Ambulatory Visit
Admission: RE | Admit: 2019-05-02 | Discharge: 2019-05-02 | Disposition: A | Discharge status: Home / Self Care | Payer: Medicaid Other | Source: Ambulatory Visit | Attending: Surgery | Admitting: Surgery

## 2019-05-02 ENCOUNTER — Other Ambulatory Visit: Payer: Self-pay

## 2019-05-02 DIAGNOSIS — Z01812 Encounter for preprocedural laboratory examination: Secondary | ICD-10-CM | POA: Insufficient documentation

## 2019-05-02 DIAGNOSIS — Z20822 Contact with and (suspected) exposure to covid-19: Secondary | ICD-10-CM | POA: Diagnosis not present

## 2019-05-02 LAB — SARS CORONAVIRUS 2 (TAT 6-24 HRS): SARS Coronavirus 2: NEGATIVE

## 2019-05-02 NOTE — Consult Note (Signed)
Consult History and Physical   SERVICE: Gynecology  Patient Name: Teresa [name] Patient MRN:   [id number]  CC: low abdominal pain  HPI: Teresa [name] is a 49 y.o. [id number] with gradually worsening low pelvic/abdominal pain.  She has known umbilical hernia, scheduled for surgery next week.   Also has known cyst from prior scans.  Today had worsening pain.    Review of Systems: positives in bold GEN:   fevers, chills, weight changes, appetite changes, fatigue, night sweats HEENT:  HA, vision changes, hearing loss, congestion, rhinorrhea, sinus pressure, dysphagia CV:   CP, palpitations PULM:  SOB, cough GI:  abd pain, N/V/D/C GU:  dysuria, urgency, frequency MSK:  arthralgias, myalgias, back pain, swelling SKIN:  rashes, color changes, pallor NEURO:  numbness, weakness, tingling, seizures, dizziness, tremors PSYCH:  depression, anxiety, behavioral problems, confusion  HEME/LYMPH:  easy bruising or bleeding ENDO:  heat/cold intolerance  Past Obstetrical History: OB History     Gravida  5   Para  3   Term  3   Preterm      AB  2   Living         SAB      TAB      Ectopic      Multiple      Live Births  3           Past Gynecologic History: Patient's last menstrual period was 07/10/2018 (exact date).   Past Medical History: Past Medical History:  Diagnosis Date   Anemia    iron infusions x 2   Anxiety    Arthritis    Neck, left shoulder   Asthma    uses inhaler 2-3 x week   Bipolar disorder ([place])    Depression    Endometriosis    Fibroids    GERD (gastroesophageal reflux disease)    Headache    Migraines   Hepatitis 2009   Hep C   HIV infection ([facility])    Hypertension    Pancreatitis    Seasonal allergies    SVD (spontaneous vaginal delivery)    x 1    Past Surgical History:   Past Surgical History:  Procedure Laterality Date   ABDOMINAL HYSTERECTOMY     CESAREAN SECTION     x2   DILATION AND CURETTAGE OF UTERUS     x 2 - TAB    HAND SURGERY Right    2 pins in wrist   HYSTEROSCOPY N/A 10/11/2016   Procedure: HYSTEROSCOPY WITH HYDROTHERMAL ABLATION;  Surgeon: Donnamae Jude, MD;  Location: [facility];  Service: Gynecology;  Laterality: N/A;  Caryl Pina the HTA rep will be here.  Confirmed on 09/02/16.    LAPAROSCOPIC BILATERAL SALPINGECTOMY Bilateral 07/11/2018   Procedure: LAPAROSCOPIC BILATERAL SALPINGECTOMY;  Surgeon: Ezma Rehm, Honor Loh, MD;  Location: [facility];  Service: Gynecology;  Laterality: Bilateral;   LAPAROSCOPIC HYSTERECTOMY N/A 07/11/2018   Procedure: HYSTERECTOMY TOTAL LAPAROSCOPIC;  Surgeon: Draylen Lobue, Honor Loh, MD;  Location: [facility];  Service: Gynecology;  Laterality: N/A;   LAPAROSCOPIC TUBAL LIGATION Bilateral 10/11/2016   Procedure: LAPAROSCOPIC TUBAL LIGATION WITH FILSHIE CLIPS;  Surgeon: Donnamae Jude, MD;  Location: [facility];  Service: Gynecology;  Laterality: Bilateral;   MOUTH SURGERY     UMBILICAL HERNIA REPAIR N/A 07/11/2018   Procedure: LAPAROSCOPIC UMBILICAL HERNIA REPAIR;  Surgeon: Benjamine Sprague, DO;  Location: [facility];  Service: General;  Laterality: N/A;   WISDOM TOOTH EXTRACTION      Family History:  family history includes Breast cancer in her cousin and paternal grandmother.  Social History:  Social History   Socioeconomic History   Marital status: Legally Separated    Spouse name: Not on file   Number of children: Not on file   Years of education: Not on file   Highest education level: Not on file  Occupational History   Not on file  Tobacco Use   Smoking status: Former Smoker    Packs/day: 0.25    Years: 25.00    Pack years: 6.25    Types: Cigarettes   Smokeless tobacco: Never Used   Tobacco comment: uses vapor occasionally  Substance and Sexual Activity   Alcohol use: Yes    Comment: wine occ   Drug use: Yes    Types: Marijuana    Comment: 2-3 x per month   Sexual activity: Not Currently  Other Topics Concern   Not on file  Social History Narrative   Not on file   Social  Determinants of Health   Financial Resource Strain:    Difficulty of Paying Living Expenses: Not on file  Food Insecurity:    Worried About Charity fundraiser in the Last Year: Not on file   [employer] of Food in the Last Year: Not on file  Transportation Needs:    Lack of Transportation (Medical): Not on file   Lack of Transportation (Non-Medical): Not on file  Physical Activity:    Days of Exercise per Week: Not on file   Minutes of Exercise per Session: Not on file  Stress:    Feeling of Stress : Not on file  Social Connections:    Frequency of Communication with Friends and Family: Not on file   Frequency of Social Gatherings with Friends and Family: Not on file   Attends Religious Services: Not on file   Active Member of Clubs or Organizations: Not on file   Attends Archivist Meetings: Not on file   Marital Status: Not on file  Intimate Partner Violence:    Fear of Current or Ex-Partner: Not on file   Emotionally Abused: Not on file   Physically Abused: Not on file   Sexually Abused: Not on file    Home Medications:  Medications reconciled in EPIC  No current facility-administered medications on file prior to encounter.   Current Outpatient Medications on File Prior to Encounter  Medication Sig Dispense Refill   abacavir-dolutegravir-lamiVUDine (TRIUMEQ) 600-50-300 MG tablet Take 1 tablet by mouth daily. 30 tablet 3   albuterol (PROAIR HFA) 108 (90 Base) MCG/ACT inhaler Inhale 2 puffs into the lungs every 6 (six) hours as needed for wheezing or shortness of breath.      amLODipine (NORVASC) 5 MG tablet Take 1 tablet (5 mg total) by mouth daily. (Patient not taking: Reported on 03/15/2019) 15 tablet 0   buPROPion (WELLBUTRIN) 75 MG tablet Take 75 mg by mouth 2 (two) times daily.     cetirizine (ZYRTEC) 10 MG tablet Take 10 mg by mouth daily as needed for allergies.      diclofenac Sodium (VOLTAREN) 1 % GEL Apply 2 g topically daily as needed.     fluticasone  (FLONASE) 50 MCG/ACT nasal spray Place 1 spray into both nostrils daily as needed for allergies or rhinitis.      Galcanezumab-gnlm (EMGALITY) 120 MG/ML SOSY Inject 120 mg into the skin every 28 (twenty-eight) days.     lisinopril (ZESTRIL) 10 MG tablet Take 10 mg by mouth  daily.     meloxicam (MOBIC) 7.5 MG tablet Take 7.5 mg by mouth daily.     Multiple Vitamin (MULTIVITAMIN WITH MINERALS) TABS tablet Take 2 tablets by mouth daily. Vita fusion     nortriptyline (PAMELOR) 10 MG capsule Take 40 mg by mouth at bedtime.     sertraline (ZOLOFT) 100 MG tablet Take 150 mg by mouth at bedtime.      SUMAtriptan (IMITREX) 100 MG tablet Take 100 mg by mouth every 2 (two) hours as needed for migraine. May repeat in 2 hours if headache persists or recurs.       Allergies:  Allergies  Allergen Reactions   Naproxen Sodium Hives    Can take ibuprofen without causing any hives    Physical Exam:      General Appearance:  Well developed, well nourished, no acute distress, alert and oriented, cooperative and appears stated age 44:  Normocephalic atraumatic, extraocular movements intact, moist mucous membranes, neck supple with midline trachea and thyroid without masses Cardiovascular:  Normal S1/S2, regular rate and rhythm, no murmurs, 2+ distal pulses Pulmonary:  clear to auscultation, no wheezes, rales or rhonchi, symmetric air entry, good air exchange Abdomen:  Bowel sounds present, soft, nondistended, no abnormal masses or organomegaly, no epigastric pain, tender in LLQ with correlation of cyst on ultrasound Back: inspection of back is normal Extremities:  extremities normal, no tenderness, atraumatic, no cyanosis or edema Skin:  normal coloration and turgor, no rashes, no suspicious skin lesions noted  Neurologic:  Cranial nerves 2-12 grossly intact, grossly equal strength and muscle tone, normal speech, no focal findings or movement disorder noted. Psychiatric:  Normal mood and affect,  appropriate, no AH/VH     Labs/Studies:   CBC and Coags:  Lab Results  Component Value Date   WBC 5.1 04/27/2019   NEUTOPHILPCT 52 04/27/2019   EOSPCT 1 04/27/2019   BASOPCT 1 04/27/2019   LYMPHOPCT 39 04/27/2019   HGB 15.0 04/27/2019   HCT 41.5 04/27/2019   MCV 82.8 04/27/2019   PLT 160 04/27/2019   INR 1.0 02/13/2019   CMP:  Lab Results  Component Value Date   NA 136 04/27/2019   K 4.3 04/27/2019   CL 103 04/27/2019   CO2 23 04/27/2019   BUN 6 04/27/2019   CREATININE 0.62 04/27/2019   CREATININE 0.84 02/13/2019   CREATININE 0.83 07/05/2018   PROT 8.2 (H) 04/27/2019   BILITOT 1.0 04/27/2019   ALT 80 (H) 04/27/2019   AST 91 (H) 04/27/2019   ALKPHOS 115 04/27/2019   Imaging: US Transvaginal Non-OB  Result Date: 04/27/2019 CLINICAL DATA:  Pelvic pain EXAM: TRANSABDOMINAL AND TRANSVAGINAL ULTRASOUND OF PELVIS DOPPLER ULTRASOUND OF OVARIES TECHNIQUE: Both transabdominal and transvaginal ultrasound examinations of the pelvis were performed. Transabdominal technique was performed for global imaging of the pelvis including uterus, ovaries, adnexal regions, and pelvic cul-de-sac. It was necessary to proceed with endovaginal exam following the transabdominal exam to visualize the ovaries. Color and duplex Doppler ultrasound was utilized to evaluate blood flow to the ovaries. COMPARISON:  Recent CT of the abdomen and pelvis. FINDINGS: Uterus Surgically removed Right ovary Not well visualized Left ovary Measurements: 7.4 x 4.9 x 5.3 cm. = volume: 99 mL. Multiple cystic lesions are noted within the left ovary similar to that seen on the prior CT examination. Some internal debris is identified. Pulsed Doppler evaluation of the left ovary demonstrates normal low-resistance arterial and venous waveforms. Other findings Mild free fluid is noted. Some debris is noted  within the free fluid in this could be related to hemorrhage. IMPRESSION: Multi cystic lesion within the left ovary with internal  debris likely representing hemorrhage. Possible cystic ovarian neoplasm deserves consideration although no definitive malignant features are seen. Follow-up examination is recommended by means of ultrasound in 3 months to assess for stability/regression. Electronically Signed   By: Inez Catalina M.D.   On: 04/27/2019 11:55   US Pelvis Complete  Result Date: 04/27/2019 CLINICAL DATA:  Pelvic pain EXAM: TRANSABDOMINAL AND TRANSVAGINAL ULTRASOUND OF PELVIS DOPPLER ULTRASOUND OF OVARIES TECHNIQUE: Both transabdominal and transvaginal ultrasound examinations of the pelvis were performed. Transabdominal technique was performed for global imaging of the pelvis including uterus, ovaries, adnexal regions, and pelvic cul-de-sac. It was necessary to proceed with endovaginal exam following the transabdominal exam to visualize the ovaries. Color and duplex Doppler ultrasound was utilized to evaluate blood flow to the ovaries. COMPARISON:  Recent CT of the abdomen and pelvis. FINDINGS: Uterus Surgically removed Right ovary Not well visualized Left ovary Measurements: 7.4 x 4.9 x 5.3 cm. = volume: 99 mL. Multiple cystic lesions are noted within the left ovary similar to that seen on the prior CT examination. Some internal debris is identified. Pulsed Doppler evaluation of the left ovary demonstrates normal low-resistance arterial and venous waveforms. Other findings Mild free fluid is noted. Some debris is noted within the free fluid in this could be related to hemorrhage. IMPRESSION: Multi cystic lesion within the left ovary with internal debris likely representing hemorrhage. Possible cystic ovarian neoplasm deserves consideration although no definitive malignant features are seen. Follow-up examination is recommended by means of ultrasound in 3 months to assess for stability/regression. Electronically Signed   By: Inez Catalina M.D.   On: 04/27/2019 11:55   CT ABDOMEN PELVIS W CONTRAST  Result Date: 04/27/2019 CLINICAL  DATA:  Acute right lower quadrant abdominal pain. EXAM: CT ABDOMEN AND PELVIS WITH CONTRAST TECHNIQUE: Multidetector CT imaging of the abdomen and pelvis was performed using the standard protocol following bolus administration of intravenous contrast. CONTRAST:  127mL OMNIPAQUE IOHEXOL 300 MG/ML  SOLN COMPARISON:  July 14, 2018. FINDINGS: Lower chest: No acute abnormality. Hepatobiliary: No focal liver abnormality is seen. No gallstones, gallbladder wall thickening, or biliary dilatation. Pancreas: Unremarkable. No pancreatic ductal dilatation or surrounding inflammatory changes. Spleen: Normal in size without focal abnormality. Adrenals/Urinary Tract: Adrenal glands are unremarkable. Kidneys are normal, without renal calculi, focal lesion, or hydronephrosis. Bladder is unremarkable. Stomach/Bowel: Stomach is within normal limits. Appendix appears normal. No evidence of bowel wall thickening, distention, or inflammatory changes. Vascular/Lymphatic: No significant vascular findings are present. No enlarged abdominal or pelvic lymph nodes. Reproductive: Status post hysterectomy. No right adnexal abnormality is noted. There is interval development of left adnexal mass measuring 7.3 x 4.5 cm with multiple rounded cystic areas. This may represent cystic ovarian neoplasm, and further evaluation with ultrasound is recommended. Other: Mild amount of free fluid is noted in the pelvis. Moderate size fat containing periumbilical hernia is noted. Musculoskeletal: No acute or significant osseous findings. IMPRESSION: 1. Moderate size fat containing periumbilical hernia. 2. Interval development of 7.3 x 4.5 cm left adnexal mass with multiple rounded cystic areas. This may represent cystic ovarian neoplasm, and further evaluation with ultrasound is recommended. 3. Mild amount of free fluid is noted in the pelvis. Electronically Signed   By: Marijo Conception M.D.   On: 04/27/2019 10:25   [place] Art/Ven Flow Abd Pelv Doppler  Result  Date: 04/27/2019 CLINICAL DATA:  Pelvic pain  EXAM: TRANSABDOMINAL AND TRANSVAGINAL ULTRASOUND OF PELVIS DOPPLER ULTRASOUND OF OVARIES TECHNIQUE: Both transabdominal and transvaginal ultrasound examinations of the pelvis were performed. Transabdominal technique was performed for global imaging of the pelvis including uterus, ovaries, adnexal regions, and pelvic cul-de-sac. It was necessary to proceed with endovaginal exam following the transabdominal exam to visualize the ovaries. Color and duplex Doppler ultrasound was utilized to evaluate blood flow to the ovaries. COMPARISON:  Recent CT of the abdomen and pelvis. FINDINGS: Uterus Surgically removed Right ovary Not well visualized Left ovary Measurements: 7.4 x 4.9 x 5.3 cm. = volume: 99 mL. Multiple cystic lesions are noted within the left ovary similar to that seen on the prior CT examination. Some internal debris is identified. Pulsed Doppler evaluation of the left ovary demonstrates normal low-resistance arterial and venous waveforms. Other findings Mild free fluid is noted. Some debris is noted within the free fluid in this could be related to hemorrhage. IMPRESSION: Multi cystic lesion within the left ovary with internal debris likely representing hemorrhage. Possible cystic ovarian neoplasm deserves consideration although no definitive malignant features are seen. Follow-up examination is recommended by means of ultrasound in 3 months to assess for stability/regression. Electronically Signed   By: Inez Catalina M.D.   On: 04/27/2019 11:55     Assessment / Plan:   SASCHA DISMUKES is a 49 y.o. [id number] who presents with low abdominal pain  1. Nicci had a TLH BS thus this is likely not torsion, nor is her pain pattern consistent with torsion.  However she does have a large cystic mass and is scheduled for surgery in a week to improve her umbilical hernia, with Dr. Lysle Pearl.  She is comfortable waiting the week and having the single, joint surgery, which has  been agreed upon by Dr. Lysle Pearl.    2. Pancreatitis? Her LFTs are elevated as is her lipase.    Thank you for the opportunity to be involved with this patient's care.  ----- Larey Days, MD Attending Obstetrician and Gynecologist [facility], Department of [facility]

## 2019-05-03 ENCOUNTER — Ambulatory Visit: Payer: Self-pay | Admitting: Surgery

## 2019-05-03 NOTE — H&P (Signed)
Subjective:   CC: Umbilical hernia without obstruction and without gangrene [K42.9]   HPI:  Teresa [name] is a 49 y.o. female who is here for followup for above.  Status post lap salpingectomy and concurrent primary umbilical hernia repair over 8 months ago.  Patient states around 2 months ago she started noticing white discoloration around the incision sites.  Few weeks ago she started noticing increasing bulge at the former umbilical hernia repair site.  Now to the point where it is constantly giving her discomfort especially with pressure.  Around that time she had an episode of coughing and vomiting that put undue stress in the area. .     Current Medications: has a current medication list which includes the following prescription(s): abacavir-dolutegravir-lamivudine, bupropion, butalbital-acetaminophen-caffeine, emgality syringe, lisinopril, meloxicam, nortriptyline, sertraline, and sumatriptan.  Allergies:      Allergies  Allergen Reactions  . Naprosyn [Naproxen] Hives    ROS: General: Denies weight loss, weight gain, fatigue, fevers, chills, and night sweats. Heart: Denies chest pain, palpitations, racing heart, irregular heartbeat, leg pain or swelling, and decreased activity tolerance. Respiratory: Denies breathing difficulty, shortness of breath, wheezing, cough, and sputum. GI: Denies change in appetite, heartburn, nausea, vomiting, constipation, diarrhea, and blood in stool. GU: Denies difficulty urinating, pain with urinating, urgency, frequency, blood in urine    Objective:   BP (!) 176/106   Pulse 80   Ht 170.2 cm (5\' 7" )   Wt 79.4 kg (175 lb)   LMP 06/10/2018 (Exact Date)   BMI 27.41 kg/m   Constitutional :  alert, appears stated age, cooperative and no distress  Gastrointestinal: Soft, no guarding, small, slightly tender but easily reducible umbilical hernia noted.  Blotchy white skin discoloration noted around incision site at the umbilical area, and  also at one of the port sites in the right lower quadrant.   Musculoskeletal: Steady gait and movement  Skin: Cool and moist, incision clean, dry, intact.  No erythema, induration or drainage to indicate infection.    Psychiatric: Normal affect, non-agitated, not confused       LABS:  N/A   RADS: N/A  Assessment:      Umbilical hernia without obstruction and without gangrene [K42.9], recurrent  Plan:     Discussed the risk of surgery including recurrence, which can be up to 50% in the case of incisional or complex hernias, possible use of prosthetic materials (mesh) and the increased risk of mesh infxn if used, bleeding, chronic pain, post-op infxn, post-op SBO or ileus, and possible re-operation to address said risks. The risks of general anesthetic, if used, includes MI, CVA, sudden death or even reaction to anesthetic medications also discussed. Alternatives include continued observation.  Benefits include possible symptom relief, prevention of incarceration, strangulation, enlargement in size over time, and the risk of emergency surgery in the face of strangulation.   Typical post-op recovery time of 3-5 days with 2 weeks of activity restrictions were also discussed.  ED return precautions given for sudden increase in pain, size of hernia with accompanying fever, nausea, and/or vomiting.  The patient verbalized understanding and all questions were answered to the patient's satisfaction.   2. Patient has elected to proceed with surgical treatment. Procedure will be scheduled.  Robotic assisted laparoscopic despite size of hernia, since this is a recurrence of a primary repair.  Written consent was obtained.  Skin discoloration likely from kenalog injection from previous surgery.

## 2019-05-03 NOTE — H&P (View-Only) (Signed)
Subjective:   CC: Umbilical hernia without obstruction and without gangrene [K42.9]   HPI:  Teresa [name] is a 49 y.o. female who is here for followup for above.  Status post lap salpingectomy and concurrent primary umbilical hernia repair over 8 months ago.  Patient states around 2 months ago she started noticing white discoloration around the incision sites.  Few weeks ago she started noticing increasing bulge at the former umbilical hernia repair site.  Now to the point where it is constantly giving her discomfort especially with pressure.  Around that time she had an episode of coughing and vomiting that put undue stress in the area. .     Current Medications: has a current medication list which includes the following prescription(s): abacavir-dolutegravir-lamivudine, bupropion, butalbital-acetaminophen-caffeine, emgality syringe, lisinopril, meloxicam, nortriptyline, sertraline, and sumatriptan.  Allergies:      Allergies  Allergen Reactions  . Naprosyn [Naproxen] Hives    ROS: General: Denies weight loss, weight gain, fatigue, fevers, chills, and night sweats. Heart: Denies chest pain, palpitations, racing heart, irregular heartbeat, leg pain or swelling, and decreased activity tolerance. Respiratory: Denies breathing difficulty, shortness of breath, wheezing, cough, and sputum. GI: Denies change in appetite, heartburn, nausea, vomiting, constipation, diarrhea, and blood in stool. GU: Denies difficulty urinating, pain with urinating, urgency, frequency, blood in urine    Objective:   BP (!) 176/106   Pulse 80   Ht 170.2 cm (5\' 7" )   Wt 79.4 kg (175 lb)   LMP 06/10/2018 (Exact Date)   BMI 27.41 kg/m   Constitutional :  alert, appears stated age, cooperative and no distress  Gastrointestinal: Soft, no guarding, small, slightly tender but easily reducible umbilical hernia noted.  Blotchy white skin discoloration noted around incision site at the umbilical area, and  also at one of the port sites in the right lower quadrant.   Musculoskeletal: Steady gait and movement  Skin: Cool and moist, incision clean, dry, intact.  No erythema, induration or drainage to indicate infection.    Psychiatric: Normal affect, non-agitated, not confused       LABS:  N/A   RADS: N/A  Assessment:      Umbilical hernia without obstruction and without gangrene [K42.9], recurrent  Plan:     Discussed the risk of surgery including recurrence, which can be up to 50% in the case of incisional or complex hernias, possible use of prosthetic materials (mesh) and the increased risk of mesh infxn if used, bleeding, chronic pain, post-op infxn, post-op SBO or ileus, and possible re-operation to address said risks. The risks of general anesthetic, if used, includes MI, CVA, sudden death or even reaction to anesthetic medications also discussed. Alternatives include continued observation.  Benefits include possible symptom relief, prevention of incarceration, strangulation, enlargement in size over time, and the risk of emergency surgery in the face of strangulation.   Typical post-op recovery time of 3-5 days with 2 weeks of activity restrictions were also discussed.  ED return precautions given for sudden increase in pain, size of hernia with accompanying fever, nausea, and/or vomiting.  The patient verbalized understanding and all questions were answered to the patient's satisfaction.   2. Patient has elected to proceed with surgical treatment. Procedure will be scheduled.  Robotic assisted laparoscopic despite size of hernia, since this is a recurrence of a primary repair.  Written consent was obtained.  Skin discoloration likely from kenalog injection from previous surgery.

## 2019-05-04 ENCOUNTER — Encounter
Admission: RE | Disposition: A | Discharge status: Home / Self Care | Payer: Self-pay | Source: Home / Self Care | Attending: Surgery

## 2019-05-04 ENCOUNTER — Ambulatory Visit: Payer: Medicaid Other | Admitting: Certified Registered Nurse Anesthetist

## 2019-05-04 ENCOUNTER — Ambulatory Visit
Admission: RE | Admit: 2019-05-04 | Discharge: 2019-05-04 | Disposition: A | Discharge status: Home / Self Care | Payer: Medicaid Other | Attending: Surgery | Admitting: Surgery

## 2019-05-04 ENCOUNTER — Encounter: Payer: Self-pay | Admitting: Surgery

## 2019-05-04 ENCOUNTER — Other Ambulatory Visit: Payer: Self-pay

## 2019-05-04 DIAGNOSIS — Z79899 Other long term (current) drug therapy: Secondary | ICD-10-CM | POA: Insufficient documentation

## 2019-05-04 DIAGNOSIS — Z791 Long term (current) use of non-steroidal anti-inflammatories (NSAID): Secondary | ICD-10-CM | POA: Insufficient documentation

## 2019-05-04 DIAGNOSIS — G43909 Migraine, unspecified, not intractable, without status migrainosus: Secondary | ICD-10-CM | POA: Insufficient documentation

## 2019-05-04 DIAGNOSIS — M1990 Unspecified osteoarthritis, unspecified site: Secondary | ICD-10-CM | POA: Diagnosis not present

## 2019-05-04 DIAGNOSIS — F319 Bipolar disorder, unspecified: Secondary | ICD-10-CM | POA: Insufficient documentation

## 2019-05-04 DIAGNOSIS — Z9079 Acquired absence of other genital organ(s): Secondary | ICD-10-CM | POA: Diagnosis not present

## 2019-05-04 DIAGNOSIS — K429 Umbilical hernia without obstruction or gangrene: Secondary | ICD-10-CM | POA: Diagnosis present

## 2019-05-04 DIAGNOSIS — Z21 Asymptomatic human immunodeficiency virus [HIV] infection status: Secondary | ICD-10-CM | POA: Insufficient documentation

## 2019-05-04 DIAGNOSIS — K439 Ventral hernia without obstruction or gangrene: Secondary | ICD-10-CM | POA: Diagnosis not present

## 2019-05-04 DIAGNOSIS — F419 Anxiety disorder, unspecified: Secondary | ICD-10-CM | POA: Insufficient documentation

## 2019-05-04 DIAGNOSIS — N83202 Unspecified ovarian cyst, left side: Secondary | ICD-10-CM | POA: Insufficient documentation

## 2019-05-04 DIAGNOSIS — Z886 Allergy status to analgesic agent status: Secondary | ICD-10-CM | POA: Insufficient documentation

## 2019-05-04 DIAGNOSIS — I10 Essential (primary) hypertension: Secondary | ICD-10-CM | POA: Insufficient documentation

## 2019-05-04 DIAGNOSIS — Z87891 Personal history of nicotine dependence: Secondary | ICD-10-CM | POA: Diagnosis not present

## 2019-05-04 DIAGNOSIS — Z90710 Acquired absence of both cervix and uterus: Secondary | ICD-10-CM | POA: Insufficient documentation

## 2019-05-04 DIAGNOSIS — J45909 Unspecified asthma, uncomplicated: Secondary | ICD-10-CM | POA: Insufficient documentation

## 2019-05-04 HISTORY — PX: ROBOTIC ASSISTED LAPAROSCOPIC OVARIAN CYSTECTOMY: SHX6081

## 2019-05-04 LAB — URINE DRUG SCREEN, QUALITATIVE (ARMC ONLY)
Amphetamines, Ur Screen: NOT DETECTED
Barbiturates, Ur Screen: POSITIVE — AB
Benzodiazepine, Ur Scrn: NOT DETECTED
Cannabinoid 50 Ng, Ur ~~LOC~~: POSITIVE — AB
Cocaine Metabolite,Ur ~~LOC~~: NOT DETECTED
MDMA (Ecstasy)Ur Screen: NOT DETECTED
Methadone Scn, Ur: NOT DETECTED
Opiate, Ur Screen: NOT DETECTED
Phencyclidine (PCP) Ur S: NOT DETECTED
Tricyclic, Ur Screen: POSITIVE — AB

## 2019-05-04 SURGERY — REPAIR, HERNIA, UMBILICAL, ROBOT-ASSISTED
Anesthesia: General | Site: Abdomen

## 2019-05-04 MED ORDER — PROPOFOL 10 MG/ML IV BOLUS
INTRAVENOUS | Status: DC | PRN
  Administered 2019-05-04: 200 mg via INTRAVENOUS

## 2019-05-04 MED ORDER — DOCUSATE SODIUM 100 MG PO CAPS: 100.0000 mg | ORAL_CAPSULE | Freq: Two times a day (BID) | ORAL | 0 refills | Status: AC | PRN

## 2019-05-04 MED ORDER — LACTATED RINGERS IV SOLN: INTRAVENOUS | Status: DC

## 2019-05-04 MED ORDER — SODIUM CHLORIDE 0.9 % IV SOLN
INTRAVENOUS | Status: DC | PRN
  Administered 2019-05-04: 70 mL

## 2019-05-04 MED ORDER — OXYCODONE HCL 5 MG PO TABS
5.0000 mg | ORAL_TABLET | Freq: Once | ORAL | Status: AC | PRN
  Administered 2019-05-04: 5 mg via ORAL

## 2019-05-04 MED ORDER — HYDROMORPHONE HCL 1 MG/ML IJ SOLN
INTRAMUSCULAR | Status: DC | PRN
  Administered 2019-05-04: .5 mg via INTRAVENOUS

## 2019-05-04 MED ORDER — LABETALOL HCL 5 MG/ML IV SOLN
INTRAVENOUS | Status: AC
  Administered 2019-05-04: 10 mg via INTRAVENOUS
  Filled 2019-05-04: qty 4

## 2019-05-04 MED ORDER — DEXMEDETOMIDINE HCL 200 MCG/2ML IV SOLN
INTRAVENOUS | Status: DC | PRN
  Administered 2019-05-04 (×2): 8 ug via INTRAVENOUS
  Administered 2019-05-04 (×2): 4 ug via INTRAVENOUS
  Administered 2019-05-04 (×2): 8 ug via INTRAVENOUS

## 2019-05-04 MED ORDER — ONDANSETRON HCL 4 MG/2ML IJ SOLN
INTRAMUSCULAR | Status: AC
  Filled 2019-05-04: qty 2

## 2019-05-04 MED ORDER — LIDOCAINE HCL (PF) 2 % IJ SOLN
INTRAMUSCULAR | Status: AC
  Filled 2019-05-04: qty 10

## 2019-05-04 MED ORDER — FENTANYL CITRATE (PF) 100 MCG/2ML IJ SOLN
INTRAMUSCULAR | Status: AC
  Administered 2019-05-04: 25 ug via INTRAVENOUS
  Filled 2019-05-04: qty 2

## 2019-05-04 MED ORDER — HYDRALAZINE HCL 20 MG/ML IJ SOLN: 10.0000 mg | Freq: Once | INTRAMUSCULAR | Status: AC

## 2019-05-04 MED ORDER — GABAPENTIN 300 MG PO CAPS
ORAL_CAPSULE | ORAL | Status: AC
  Administered 2019-05-04: 600 mg via ORAL
  Filled 2019-05-04: qty 2

## 2019-05-04 MED ORDER — ROCURONIUM BROMIDE 50 MG/5ML IV SOLN
INTRAVENOUS | Status: AC
  Filled 2019-05-04: qty 1

## 2019-05-04 MED ORDER — PROMETHAZINE HCL 25 MG/ML IJ SOLN
INTRAMUSCULAR | Status: AC
  Administered 2019-05-04: 6.25 mg via INTRAVENOUS
  Filled 2019-05-04: qty 1

## 2019-05-04 MED ORDER — OXYCODONE HCL 5 MG/5ML PO SOLN: 5.0000 mg | Freq: Once | ORAL | Status: AC | PRN

## 2019-05-04 MED ORDER — EPINEPHRINE PF 1 MG/ML IJ SOLN
INTRAMUSCULAR | Status: AC
  Filled 2019-05-04: qty 1

## 2019-05-04 MED ORDER — BUPIVACAINE LIPOSOME 1.3 % IJ SUSP
INTRAMUSCULAR | Status: AC
  Filled 2019-05-04: qty 20

## 2019-05-04 MED ORDER — SUGAMMADEX SODIUM 200 MG/2ML IV SOLN
INTRAVENOUS | Status: AC
  Filled 2019-05-04: qty 2

## 2019-05-04 MED ORDER — BUPIVACAINE HCL (PF) 0.5 % IJ SOLN
INTRAMUSCULAR | Status: AC
  Filled 2019-05-04: qty 30

## 2019-05-04 MED ORDER — PROPOFOL 10 MG/ML IV BOLUS
INTRAVENOUS | Status: AC
  Filled 2019-05-04: qty 20

## 2019-05-04 MED ORDER — HYDROMORPHONE HCL 1 MG/ML IJ SOLN
INTRAMUSCULAR | Status: AC
  Filled 2019-05-04: qty 1

## 2019-05-04 MED ORDER — HYDROCODONE-ACETAMINOPHEN 5-325 MG PO TABS: 1.0000 | ORAL_TABLET | Freq: Four times a day (QID) | ORAL | 0 refills | Status: DC | PRN

## 2019-05-04 MED ORDER — PROMETHAZINE HCL 25 MG/ML IJ SOLN
6.2500 mg | INTRAMUSCULAR | Status: AC | PRN
  Administered 2019-05-04: 6.25 mg via INTRAVENOUS

## 2019-05-04 MED ORDER — LABETALOL HCL 5 MG/ML IV SOLN
INTRAVENOUS | Status: DC | PRN
  Administered 2019-05-04: 5 mg via INTRAVENOUS

## 2019-05-04 MED ORDER — MIDAZOLAM HCL 2 MG/2ML IJ SOLN
INTRAMUSCULAR | Status: DC | PRN
  Administered 2019-05-04: 2 mg via INTRAVENOUS

## 2019-05-04 MED ORDER — GABAPENTIN 300 MG PO CAPS: 600.0000 mg | ORAL_CAPSULE | ORAL | Status: AC

## 2019-05-04 MED ORDER — ROCURONIUM BROMIDE 100 MG/10ML IV SOLN
INTRAVENOUS | Status: DC | PRN
  Administered 2019-05-04: 20 mg via INTRAVENOUS
  Administered 2019-05-04 (×2): 10 mg via INTRAVENOUS
  Administered 2019-05-04: 50 mg via INTRAVENOUS

## 2019-05-04 MED ORDER — LABETALOL HCL 5 MG/ML IV SOLN
10.0000 mg | Freq: Once | INTRAVENOUS | Status: AC
  Administered 2019-05-04: 10 mg via INTRAVENOUS

## 2019-05-04 MED ORDER — ACETAMINOPHEN 500 MG PO TABS
ORAL_TABLET | ORAL | Status: AC
  Administered 2019-05-04: 1000 mg via ORAL
  Filled 2019-05-04: qty 2

## 2019-05-04 MED ORDER — CHLORHEXIDINE GLUCONATE CLOTH 2 % EX PADS: 6.0000 | MEDICATED_PAD | Freq: Once | CUTANEOUS | Status: DC

## 2019-05-04 MED ORDER — HYDRALAZINE HCL 20 MG/ML IJ SOLN
INTRAMUSCULAR | Status: AC
  Administered 2019-05-04: 10 mg via INTRAVENOUS
  Filled 2019-05-04: qty 1

## 2019-05-04 MED ORDER — FAMOTIDINE 20 MG PO TABS: 20.0000 mg | ORAL_TABLET | Freq: Once | ORAL | Status: AC

## 2019-05-04 MED ORDER — DEXMEDETOMIDINE HCL IN NACL 80 MCG/20ML IV SOLN
INTRAVENOUS | Status: AC
  Filled 2019-05-04: qty 20

## 2019-05-04 MED ORDER — DEXAMETHASONE SODIUM PHOSPHATE 10 MG/ML IJ SOLN
INTRAMUSCULAR | Status: AC
  Filled 2019-05-04: qty 1

## 2019-05-04 MED ORDER — SODIUM CHLORIDE FLUSH 0.9 % IV SOLN
INTRAVENOUS | Status: AC
  Filled 2019-05-04: qty 10

## 2019-05-04 MED ORDER — FENTANYL CITRATE (PF) 100 MCG/2ML IJ SOLN
INTRAMUSCULAR | Status: AC
  Filled 2019-05-04: qty 2

## 2019-05-04 MED ORDER — ESMOLOL HCL 100 MG/10ML IV SOLN
INTRAVENOUS | Status: DC | PRN
  Administered 2019-05-04: 20 ug via INTRAVENOUS
  Administered 2019-05-04: 10 ug via INTRAVENOUS

## 2019-05-04 MED ORDER — MIDAZOLAM HCL 2 MG/2ML IJ SOLN
INTRAMUSCULAR | Status: AC
  Filled 2019-05-04: qty 2

## 2019-05-04 MED ORDER — OXYCODONE HCL 5 MG PO TABS
ORAL_TABLET | ORAL | Status: AC
  Filled 2019-05-04: qty 1

## 2019-05-04 MED ORDER — LIDOCAINE HCL (CARDIAC) PF 100 MG/5ML IV SOSY
PREFILLED_SYRINGE | INTRAVENOUS | Status: DC | PRN
  Administered 2019-05-04: 80 mg via INTRAVENOUS

## 2019-05-04 MED ORDER — ONDANSETRON HCL 4 MG/2ML IJ SOLN
INTRAMUSCULAR | Status: DC | PRN
  Administered 2019-05-04: 4 mg via INTRAVENOUS

## 2019-05-04 MED ORDER — SUGAMMADEX SODIUM 200 MG/2ML IV SOLN
INTRAVENOUS | Status: DC | PRN
  Administered 2019-05-04: 200 mg via INTRAVENOUS

## 2019-05-04 MED ORDER — ACETAMINOPHEN 325 MG PO TABS: 650.0000 mg | ORAL_TABLET | Freq: Three times a day (TID) | ORAL | 0 refills | Status: AC | PRN

## 2019-05-04 MED ORDER — ACETAMINOPHEN 500 MG PO TABS: 1000.0000 mg | ORAL_TABLET | ORAL | Status: AC

## 2019-05-04 MED ORDER — FAMOTIDINE 20 MG PO TABS
ORAL_TABLET | ORAL | Status: AC
  Administered 2019-05-04: 20 mg via ORAL
  Filled 2019-05-04: qty 1

## 2019-05-04 MED ORDER — CEFAZOLIN SODIUM-DEXTROSE 2-4 GM/100ML-% IV SOLN
INTRAVENOUS | Status: AC
  Filled 2019-05-04: qty 100

## 2019-05-04 MED ORDER — ESMOLOL HCL 100 MG/10ML IV SOLN
INTRAVENOUS | Status: AC
  Filled 2019-05-04: qty 10

## 2019-05-04 MED ORDER — DEXAMETHASONE SODIUM PHOSPHATE 10 MG/ML IJ SOLN
INTRAMUSCULAR | Status: DC | PRN
  Administered 2019-05-04: 10 mg via INTRAVENOUS

## 2019-05-04 MED ORDER — LABETALOL HCL 5 MG/ML IV SOLN: 10.0000 mg | INTRAVENOUS | Status: DC | PRN

## 2019-05-04 MED ORDER — LABETALOL HCL 5 MG/ML IV SOLN
INTRAVENOUS | Status: AC
  Filled 2019-05-04: qty 4

## 2019-05-04 MED ORDER — ONDANSETRON HCL 4 MG/2ML IJ SOLN
INTRAMUSCULAR | Status: AC
  Administered 2019-05-04: 4 mg via INTRAVENOUS
  Filled 2019-05-04: qty 2

## 2019-05-04 MED ORDER — ONDANSETRON HCL 4 MG/2ML IJ SOLN: 4.0000 mg | Freq: Once | INTRAMUSCULAR | Status: AC

## 2019-05-04 MED ORDER — FENTANYL CITRATE (PF) 100 MCG/2ML IJ SOLN
25.0000 ug | INTRAMUSCULAR | Status: DC | PRN
  Administered 2019-05-04: 50 ug via INTRAVENOUS

## 2019-05-04 MED ORDER — FENTANYL CITRATE (PF) 100 MCG/2ML IJ SOLN
INTRAMUSCULAR | Status: DC | PRN
  Administered 2019-05-04 (×4): 50 ug via INTRAVENOUS

## 2019-05-04 MED ORDER — CEFAZOLIN SODIUM-DEXTROSE 2-4 GM/100ML-% IV SOLN
2.0000 g | INTRAVENOUS | Status: AC
  Administered 2019-05-04: 2 g via INTRAVENOUS

## 2019-05-04 MED ORDER — SODIUM CHLORIDE (PF) 0.9 % IJ SOLN
INTRAMUSCULAR | Status: AC
  Filled 2019-05-04: qty 50

## 2019-05-04 MED ORDER — BUPIVACAINE-EPINEPHRINE 0.5% -1:200000 IJ SOLN
INTRAMUSCULAR | Status: DC | PRN
  Administered 2019-05-04: 30 mL

## 2019-05-04 SURGICAL SUPPLY — 86 items
ANCHOR TIS RET SYS 235ML (MISCELLANEOUS) IMPLANT
BAG URINE DRAIN 2000ML AR STRL (UROLOGICAL SUPPLIES) ×4 IMPLANT
BLADE SURG SZ11 CARB STEEL (BLADE) IMPLANT
CANISTER SUCT 1200ML W/VALVE (MISCELLANEOUS) ×4 IMPLANT
CANNULA REDUC XI 12-8 STAPL (CANNULA) ×1
CANNULA REDUCER 12-8 DVNC XI (CANNULA) ×3 IMPLANT
CATH FOLEY 2WAY  5CC 16FR (CATHETERS)
CATH URTH 16FR FL 2W BLN LF (CATHETERS) IMPLANT
CHLORAPREP W/TINT 26 (MISCELLANEOUS) ×8 IMPLANT
COVER TIP SHEARS 8 DVNC (MISCELLANEOUS) ×3 IMPLANT
COVER TIP SHEARS 8MM DA VINCI (MISCELLANEOUS) ×1
COVER WAND RF STERILE (DRAPES) ×4 IMPLANT
DEFOGGER SCOPE WARMER CLEARIFY (MISCELLANEOUS) ×4 IMPLANT
DERMABOND ADVANCED (GAUZE/BANDAGES/DRESSINGS) ×1
DERMABOND ADVANCED .7 DNX12 (GAUZE/BANDAGES/DRESSINGS) ×3 IMPLANT
DEVICE SUTURE ENDOST 10MM (ENDOMECHANICALS) IMPLANT
DEVICE TROCAR PUNCTURE CLOSURE (ENDOMECHANICALS) IMPLANT
DRAPE 3/4 80X56 (DRAPES) ×4 IMPLANT
DRAPE ARM DVNC X/XI (DISPOSABLE) ×12 IMPLANT
DRAPE COLUMN DVNC XI (DISPOSABLE) ×3 IMPLANT
DRAPE DA VINCI XI ARM (DISPOSABLE) ×4
DRAPE DA VINCI XI COLUMN (DISPOSABLE) ×1
DRAPE LEGGINS SURG 28X43 STRL (DRAPES) ×4 IMPLANT
DRAPE UNDER BUTTOCK W/FLU (DRAPES) ×4 IMPLANT
ELECT CAUTERY BLADE 6.4 (BLADE) IMPLANT
ELECT REM PT RETURN 9FT ADLT (ELECTROSURGICAL) ×4
ELECTRODE REM PT RTRN 9FT ADLT (ELECTROSURGICAL) ×3 IMPLANT
ETHIBOND 2 0 GREEN CT 2 30IN (SUTURE) IMPLANT
GLOVE BIOGEL PI IND STRL 7.0 (GLOVE) ×12 IMPLANT
GLOVE BIOGEL PI INDICATOR 7.0 (GLOVE) ×4
GLOVE PI ORTHOPRO 6.5 (GLOVE)
GLOVE PI ORTHOPRO STRL 6.5 (GLOVE) IMPLANT
GLOVE SURG SYN 6.5 ES PF (GLOVE) ×16 IMPLANT
GOWN STRL REUS W/ TWL LRG LVL3 (GOWN DISPOSABLE) ×12 IMPLANT
GOWN STRL REUS W/TWL LRG LVL3 (GOWN DISPOSABLE) ×4
GRASPER SUT TROCAR 14GX15 (MISCELLANEOUS) ×4 IMPLANT
IRRIGATION STRYKERFLOW (MISCELLANEOUS) IMPLANT
IRRIGATOR STRYKERFLOW (MISCELLANEOUS)
IRRIGATOR SUCT 8 DISP DVNC XI (IRRIGATION / IRRIGATOR) IMPLANT
IRRIGATOR SUCTION 8MM XI DISP (IRRIGATION / IRRIGATOR)
IV LACTATED RINGERS 1000ML (IV SOLUTION) IMPLANT
IV NS 1000ML (IV SOLUTION)
IV NS 1000ML BAXH (IV SOLUTION) IMPLANT
KIT PINK PAD W/HEAD ARE REST (MISCELLANEOUS) ×4
KIT PINK PAD W/HEAD ARM REST (MISCELLANEOUS) ×3 IMPLANT
KIT TURNOVER CYSTO (KITS) IMPLANT
LABEL OR SOLS (LABEL) ×4 IMPLANT
LIGASURE VESSEL 5MM BLUNT TIP (ELECTROSURGICAL) IMPLANT
MANIPULATOR UTERINE 4.5 ZUMI (MISCELLANEOUS) IMPLANT
MESH VENTRALIGHT ST 6IN CRC (Mesh General) ×4 IMPLANT
NEEDLE HYPO 22GX1.5 SAFETY (NEEDLE) ×4 IMPLANT
NEEDLE INSUFFLATION 14GA 120MM (NEEDLE) ×4 IMPLANT
NS IRRIG 500ML POUR BTL (IV SOLUTION) IMPLANT
OBTURATOR OPTICAL STANDARD 8MM (TROCAR) ×1
OBTURATOR OPTICAL STND 8 DVNC (TROCAR) ×3
OBTURATOR OPTICALSTD 8 DVNC (TROCAR) ×3 IMPLANT
PACK LAP CHOLECYSTECTOMY (MISCELLANEOUS) ×4 IMPLANT
PAD OB MATERNITY 4.3X12.25 (PERSONAL CARE ITEMS) IMPLANT
PAD PREP 24X41 OB/GYN DISP (PERSONAL CARE ITEMS) IMPLANT
PENCIL ELECTRO HAND CTR (MISCELLANEOUS) ×4 IMPLANT
POUCH SPECIMEN RETRIEVAL 10MM (ENDOMECHANICALS) IMPLANT
SCISSORS METZENBAUM CVD 33 (INSTRUMENTS) IMPLANT
SCRUB CHG 4% DYNA-HEX 4OZ (MISCELLANEOUS) IMPLANT
SEAL CANN UNIV 5-8 DVNC XI (MISCELLANEOUS) ×9 IMPLANT
SEAL XI 5MM-8MM UNIVERSAL (MISCELLANEOUS) ×3
SET TUBE SMOKE EVAC HIGH FLOW (TUBING) IMPLANT
SLEEVE ENDOPATH XCEL 5M (ENDOMECHANICALS) IMPLANT
SOLUTION ELECTROLUBE (MISCELLANEOUS) ×4 IMPLANT
STAPLER CANNULA SEAL DVNC XI (STAPLE) ×3 IMPLANT
STAPLER CANNULA SEAL XI (STAPLE) ×1
SUT DVC VLOC 3-0 CL 6 P-12 (SUTURE) IMPLANT
SUT ENDO VLOC 180-0-8IN (SUTURE) IMPLANT
SUT MNCRL AB 4-0 PS2 18 (SUTURE) ×4 IMPLANT
SUT STRATAFIX 0 PDS+ CT-2 23 (SUTURE) ×4
SUT VIC AB 2-0 UR6 27 (SUTURE) IMPLANT
SUT VIC AB 3-0 SH 27 (SUTURE)
SUT VIC AB 3-0 SH 27X BRD (SUTURE) IMPLANT
SUT VICRYL 0 AB UR-6 (SUTURE) IMPLANT
SUT VLOC 90 6 CV-15 VIOLET (SUTURE) ×8 IMPLANT
SUTURE STRATFX 0 PDS+ CT-2 23 (SUTURE) ×3 IMPLANT
SYR 10ML LL (SYRINGE) IMPLANT
SYR 30ML LL (SYRINGE) ×4 IMPLANT
TRAY FOLEY MTR SLVR 16FR STAT (SET/KITS/TRAYS/PACK) IMPLANT
TROCAR ENDO BLADELESS 11MM (ENDOMECHANICALS) IMPLANT
TROCAR XCEL NON-BLD 5MMX100MML (ENDOMECHANICALS) ×8 IMPLANT
TUBING EVAC SMOKE HEATED PNEUM (TUBING) ×4 IMPLANT

## 2019-05-04 NOTE — Op Note (Signed)
Preoperative diagnosis: umbilical hernia Postoperative diagnosis: Ventral hernia  Procedure: Robotic assisted laparoscopic ventral hernia repair with mesh  Anesthesia: general  Surgeon: Benjamine Sprague  Wound Classification: Clean  Specimen: none  Complications: None  Estimated Blood Loss: 58ml  Indications:see HPI  Findings: 1. Ventral hernia 4. Tension free repair achieved with 15 cm bard mesh and suture 5. Adequate hemostasis  Description of procedure: The patient was brought to the operating room and general anesthesia was induced. A time-out was completed verifying correct patient, procedure, site, positioning, and implant(s) and/or special equipment prior to beginning this procedure. Antibiotics were administered prior to making the incision. SCDs placed. The anterior abdominal wall was prepped and draped in the standard sterile fashion.   Palmer's point chosen for entry.  Veress needle placed and abdomen insufflated to 15cm without any dramatic increase in pressure.  Needle removed and optiview technique used to place 53mm port at same point.  No injury noted during placement.  3 additional 8 mm ports, placed along right lateral aspect.  Xi robot then docked into place.  Hernia contents noted and reduced with gentle   Once all hernia contents reduced, there was noted to be a 2cm hernia.    Dr. Leonides Schanz then took over the counsel, and proceeded with her portion of the procedure.  Please see her op note for further details.  When she was completed, I returned to the consult to finish repair of the ventral hernia.  Insufflation dropped to 44mm and transfacial suture with 0 stratafix used to primarily close defect under minimal tension. Bard protected 15cm mesh cut down to 10cm to accomodate through 70mm port easily was placed within the abdominal cavity and secured to the abdominal wall centered over the defect using the 0 stratafix previously used to primarily close defect.  The mesh was then  circumferentially sutured into the anterior abdominal wall using 2-0 VLock x3.  Any bleeding noted during this portion was no longer actively bleeding by end of securing mesh and tightening the suture.    Robot was undocked.  Abdomen then desufflated while camera within abdomen to ensure no signs of new bleed prior to removing camera and rest of ports completely. All skin incisions closed with runninrg 4-0 Monocryl in a subcuticular fashion.  All wounds then dressed with Dermabond.  Patient was then successfully awakened and transferred to PACU in stable condition.  At the end of the procedure sponge and instrument counts were correct.

## 2019-05-04 NOTE — Discharge Instructions (Signed)
Hernia repair, Care After This sheet gives you information about how to care for yourself after your procedure. Your health care provider may also give you more specific instructions. If you have problems or questions, contact your health care provider. What can I expect after the procedure? After your procedure, it is common to have the following:  Pain in your abdomen, especially in the incision areas. You will be given medicine to control the pain.  Tiredness. This is a normal part of the recovery process. Your energy level will return to normal over the next several weeks.  Changes in your bowel movements, such as constipation or needing to go more often. Talk with your health care provider about how to manage this. Follow these instructions at home: Medicines   tylenol and advil as needed for discomfort.  Please alternate between the two every four hours as needed for pain.     Use narcotics, if prescribed, only when tylenol and motrin is not enough to control pain.   325-650mg every 8hrs to max of 3000mg/24hrs (including the 325mg in every norco dose) for the tylenol.     Advil up to 800mg per dose every 8hrs as needed for pain.    PLEASE RECORD NUMBER OF PILLS TAKEN UNTIL NEXT FOLLOW UP APPT.  THIS WILL HELP DETERMINE HOW READY YOU ARE TO BE RELEASED FROM ANY ACTIVITY RESTRICTIONS  Do not drive or use heavy machinery while taking prescription pain medicine.  Do not drink alcohol while taking prescription pain medicine.  Incision care     Follow instructions from your health care provider about how to take care of your incision areas. Make sure you: ? Keep your incisions clean and dry. ? Wash your hands with soap and water before and after applying medicine to the areas, and before and after changing your bandage (dressing). If soap and water are not available, use hand sanitizer. ? Change your dressing as told by your health care provider. ? Leave stitches (sutures), skin  glue, or adhesive strips in place. These skin closures may need to stay in place for 2 weeks or longer. If adhesive strip edges start to loosen and curl up, you may trim the loose edges. Do not remove adhesive strips completely unless your health care provider tells you to do that.  Do not wear tight clothing over the incisions. Tight clothing may rub and irritate the incision areas, which may cause the incisions to open.  Do not take baths, swim, or use a hot tub until your health care provider approves. OK TO SHOWER IN 24HRS.    Check your incision area every day for signs of infection. Check for: ? More redness, swelling, or pain. ? More fluid or blood. ? Warmth. ? Pus or a bad smell. Activity  Avoid lifting anything that is heavier than 10 lb (4.5 kg) for 2 weeks or until your health care provider says it is okay.  No pushing/pulling greater than 30lbs  You may resume normal activities as told by your health care provider. Ask your health care provider what activities are safe for you.  Take rest breaks during the day as needed. Eating and drinking  Follow instructions from your health care provider about what you can eat after surgery.  To prevent or treat constipation while you are taking prescription pain medicine, your health care provider may recommend that you: ? Drink enough fluid to keep your urine clear or pale yellow. ? Take over-the-counter or prescription medicines. ?   Eat foods that are high in fiber, such as fresh fruits and vegetables, whole grains, and beans. ? Limit foods that are high in fat and processed sugars, such as fried and sweet foods. General instructions  Ask your health care provider when you will need an appointment to get your sutures or staples removed.  Keep all follow-up visits as told by your health care provider. This is important. Contact a health care provider if:  You have more redness, swelling, or pain around your incisions.  You have  more fluid or blood coming from the incisions.  Your incisions feel warm to the touch.  You have pus or a bad smell coming from your incisions or your dressing.  You have a fever.  You have an incision that breaks open (edges not staying together) after sutures or staples have been removed. Get help right away if:  You develop a rash.  You have chest pain or difficulty breathing.  You have pain or swelling in your legs.  You feel light-headed or you faint.  Your abdomen swells (becomes distended).  You have nausea or vomiting.  You have blood in your stool (feces). This information is not intended to replace advice given to you by your health care provider. Make sure you discuss any questions you have with your health care provider. Document Released: 10/30/2004 Document Revised: 12/30/2017 Document Reviewed: 01/12/2016 [employer] Interactive Patient Education  2019 [employer]    AMBULATORY SURGERY  DISCHARGE INSTRUCTIONS   1) The drugs that you were given will stay in your system until tomorrow so for the next 24 hours you should not:  A) Drive an automobile B) Make any legal decisions C) Drink any alcoholic beverage   2) You may resume regular meals tomorrow.  Today it is better to start with liquids and gradually work up to solid foods.  You may eat anything you prefer, but it is better to start with liquids, then soup and crackers, and gradually work up to solid foods.   3) Please notify your doctor immediately if you have any unusual bleeding, trouble breathing, redness and pain at the surgery site, drainage, fever, or pain not relieved by medication.    4) Additional Instructions:        Please contact your physician with any problems or Same Day Surgery at [phone], Monday through Friday 6 am to 4 pm, or [place] [facility] [place] Main number at [phone].  

## 2019-05-04 NOTE — Transfer of Care (Signed)
Immediate Anesthesia Transfer of Care Note  Patient: Teresa [name]  Procedure(s) Performed: XI ROBOT ASSISTED UMBILICAL HERNIA REPAIR (N/A Abdomen) XI ROBOTIC ASSISTED LAPAROSCOPIC OVARIAN CYSTECTOMY (Left )  Patient Location: PACU  Anesthesia Type:General  Level of Consciousness: sedated  Airway & Oxygen Therapy: Patient Spontanous Breathing and Patient connected to face mask oxygen  Post-op Assessment: Report given to RN and Post -op Vital signs reviewed and stable  Post vital signs: Reviewed and stable  Last Vitals:  Vitals Value Taken Time  BP 139/90 05/04/19 1135  Temp 36.1 C 05/04/19 1135  Pulse 80 05/04/19 1138  Resp 17 05/04/19 1138  SpO2 100 % 05/04/19 1138  Vitals shown include unvalidated device data.  Last Pain:  Vitals:   05/04/19 0723  TempSrc:   PainSc: 0-No pain         Complications: No apparent anesthesia complications

## 2019-05-04 NOTE — H&P (Signed)
Preoperative History and Physical  Teresa [name] is a 49 y.o. [id number] here for surgical management of large complex ovarian mass.   No significant preoperative concerns.  Proposed surgery: laparoscopic ovarian cystectomy, with possible oophorectomy.  Past Medical History:  Diagnosis Date  . Anemia    iron infusions x 2  . Anxiety   . Arthritis    Neck, left shoulder  . Asthma    uses inhaler 2-3 x week  . Bipolar disorder ([place])   . Depression   . Endometriosis   . Fibroids   . GERD (gastroesophageal reflux disease)   . Headache    Migraines  . Hepatitis 2009   Hep C  . HIV infection ([place])   . Hypertension   . Pancreatitis   . Seasonal allergies   . SVD (spontaneous vaginal delivery)    x 1   Past Surgical History:  Procedure Laterality Date  . ABDOMINAL HYSTERECTOMY    . CESAREAN SECTION     x2  . DILATION AND CURETTAGE OF UTERUS     x 2 - TAB  . HAND SURGERY Right    2 pins in wrist  . HYSTEROSCOPY N/A 10/11/2016   Procedure: HYSTEROSCOPY WITH HYDROTHERMAL ABLATION;  Surgeon: Donnamae Jude, MD;  Location: [facility];  Service: Gynecology;  Laterality: N/A;  Caryl Pina the HTA rep will be here.  Confirmed on 09/02/16.   Marland Kitchen LAPAROSCOPIC BILATERAL SALPINGECTOMY Bilateral 07/11/2018   Procedure: LAPAROSCOPIC BILATERAL SALPINGECTOMY;  Surgeon: Jeree Delcid, Honor Loh, MD;  Location: [facility];  Service: Gynecology;  Laterality: Bilateral;  . LAPAROSCOPIC HYSTERECTOMY N/A 07/11/2018   Procedure: HYSTERECTOMY TOTAL LAPAROSCOPIC;  Surgeon: Brannen Koppen, Honor Loh, MD;  Location: [facility];  Service: Gynecology;  Laterality: N/A;  . LAPAROSCOPIC TUBAL LIGATION Bilateral 10/11/2016   Procedure: LAPAROSCOPIC TUBAL LIGATION WITH FILSHIE CLIPS;  Surgeon: Donnamae Jude, MD;  Location: [facility];  Service: Gynecology;  Laterality: Bilateral;  . MOUTH SURGERY    . UMBILICAL HERNIA REPAIR N/A 07/11/2018   Procedure: LAPAROSCOPIC UMBILICAL HERNIA REPAIR;  Surgeon: Benjamine Sprague, DO;  Location: [facility];  Service:  General;  Laterality: N/A;  . WISDOM TOOTH EXTRACTION     OB History  Gravida Para Term Preterm AB Living  5 3 3   2     SAB TAB Ectopic Multiple Live Births          3    # Outcome Date GA Lbr Len/2nd Weight Sex Delivery Anes PTL Lv  5 AB           4 AB           3 Term      CS-LTranv     2 Term      CS-LTranv     1 Term      Vag-Spont     Patient denies any other pertinent gynecologic issues.   No current facility-administered medications on file prior to encounter.   Current Outpatient Medications on File Prior to Encounter  Medication Sig Dispense Refill  . abacavir-dolutegravir-lamiVUDine (TRIUMEQ) 600-50-300 MG tablet Take 1 tablet by mouth daily. 30 tablet 3  . albuterol (PROAIR HFA) 108 (90 Base) MCG/ACT inhaler Inhale 2 puffs into the lungs every 6 (six) hours as needed for wheezing or shortness of breath.     Marland Kitchen buPROPion (WELLBUTRIN) 75 MG tablet Take 75 mg by mouth 2 (two) times daily.    . cetirizine (ZYRTEC) 10 MG tablet Take 10 mg by mouth daily as needed for allergies.     Marland Kitchen  diclofenac Sodium (VOLTAREN) 1 % GEL Apply 2 g topically daily as needed.    . fluticasone (FLONASE) 50 MCG/ACT nasal spray Place 1 spray into both nostrils daily as needed for allergies or rhinitis.     . Galcanezumab-gnlm (EMGALITY) 120 MG/ML SOSY Inject 120 mg into the skin every 28 (twenty-eight) days.    Marland Kitchen lisinopril (ZESTRIL) 10 MG tablet Take 10 mg by mouth daily.    . meloxicam (MOBIC) 7.5 MG tablet Take 7.5 mg by mouth daily.    . Multiple Vitamin (MULTIVITAMIN WITH MINERALS) TABS tablet Take 2 tablets by mouth daily. Vita fusion    . nortriptyline (PAMELOR) 10 MG capsule Take 40 mg by mouth at bedtime.    . sertraline (ZOLOFT) 100 MG tablet Take 150 mg by mouth at bedtime.     . SUMAtriptan (IMITREX) 100 MG tablet Take 100 mg by mouth every 2 (two) hours as needed for migraine. May repeat in 2 hours if headache persists or recurs.     Marland Kitchen amLODipine (NORVASC) 5 MG tablet Take 1 tablet (5 mg  total) by mouth daily. (Patient not taking: Reported on 03/15/2019) 15 tablet 0   Allergies  Allergen Reactions  . Naproxen Sodium Hives    Can take ibuprofen without causing any hives    Social History:   reports that she has quit smoking. Her smoking use included cigarettes. She has a 6.25 pack-year smoking history. She has never used smokeless tobacco. She reports current alcohol use. She reports current drug use. Drug: Marijuana.  Family History  Problem Relation Age of Onset  . Breast cancer Paternal Grandmother   . Breast cancer Cousin     Review of Systems: Noncontributory  PHYSICAL EXAM: Blood pressure (!) 165/110, pulse 91, temperature 98.7 F (37.1 C), temperature source Temporal, resp. rate 14, height 5\' 7"  (1.702 m), weight 81.6 kg, last menstrual period 07/10/2018, SpO2 100 %. General appearance - alert, well appearing, and in no distress Chest - clear to auscultation, no wheezes, rales or rhonchi, symmetric air entry Heart - normal rate and regular rhythm Abdomen - soft, nontender, nondistended, no masses or organomegaly Pelvic - examination not indicated Extremities - peripheral pulses normal, no pedal edema, no clubbing or cyanosis  Labs: Results for orders placed or performed during the hospital encounter of 05/04/19 (from the past 336 hour(s))  Urine Drug Screen, Qualitative ([place] only)   Collection Time: 05/04/19  6:45 AM  Result Value Ref Range   Tricyclic, Ur Screen POSITIVE (A) NONE DETECTED   Amphetamines, Ur Screen NONE DETECTED NONE DETECTED   MDMA (Ecstasy)Ur Screen NONE DETECTED NONE DETECTED   Cocaine Metabolite,Ur [place] NONE DETECTED NONE DETECTED   Opiate, Ur Screen NONE DETECTED NONE DETECTED   Phencyclidine (PCP) Ur S NONE DETECTED NONE DETECTED   Cannabinoid 50 Ng, Ur [place] POSITIVE (A) NONE DETECTED   Barbiturates, Ur Screen POSITIVE (A) NONE DETECTED   Benzodiazepine, Ur Scrn NONE DETECTED NONE DETECTED   Methadone Scn, Ur NONE DETECTED NONE  DETECTED  Results for orders placed or performed during the hospital encounter of 05/02/19 (from the past 336 hour(s))  SARS CORONAVIRUS 2 (TAT 6-24 HRS) Nasopharyngeal Nasopharyngeal Swab   Collection Time: 05/02/19  9:35 AM   Specimen: Nasopharyngeal Swab  Result Value Ref Range   SARS Coronavirus 2 NEGATIVE NEGATIVE  Results for orders placed or performed during the hospital encounter of 04/27/19 (from the past 336 hour(s))  CBC with Differential   Collection Time: 04/27/19  6:00 AM  Result Value Ref Range   WBC 5.1 4.0 - 10.5 K/uL   RBC 5.01 3.87 - 5.11 MIL/uL   Hemoglobin 15.0 12.0 - 15.0 g/dL   HCT 41.5 36.0 - 46.0 %   MCV 82.8 80.0 - 100.0 fL   MCH 29.9 26.0 - 34.0 pg   MCHC 36.1 (H) 30.0 - 36.0 g/dL   RDW 12.6 11.5 - 15.5 %   Platelets 160 150 - 400 K/uL   nRBC 0.0 0.0 - 0.2 %   Neutrophils Relative % 52 %   Neutro Abs 2.7 1.7 - 7.7 K/uL   Lymphocytes Relative 39 %   Lymphs Abs 2.0 0.7 - 4.0 K/uL   Monocytes Relative 7 %   Monocytes Absolute 0.3 0.1 - 1.0 K/uL   Eosinophils Relative 1 %   Eosinophils Absolute 0.0 0.0 - 0.5 K/uL   Basophils Relative 1 %   Basophils Absolute 0.0 0.0 - 0.1 K/uL   RBC Morphology MORPHOLOGY UNREMARKABLE    Smear Review Normal platelet morphology    Immature Granulocytes 0 %   Abs Immature Granulocytes 0.01 0.00 - 0.07 K/uL  Comprehensive metabolic panel   Collection Time: 04/27/19  6:00 AM  Result Value Ref Range   Sodium 136 135 - 145 mmol/L   Potassium 4.3 3.5 - 5.1 mmol/L   Chloride 103 98 - 111 mmol/L   CO2 23 22 - 32 mmol/L   Glucose, Bld 119 (H) 70 - 99 mg/dL   BUN 6 6 - 20 mg/dL   Creatinine, Ser 0.62 0.44 - 1.00 mg/dL   Calcium 8.9 8.9 - 10.3 mg/dL   Total Protein 8.2 (H) 6.5 - 8.1 g/dL   Albumin 3.7 3.5 - 5.0 g/dL   AST 91 (H) 15 - 41 U/L   ALT 80 (H) 0 - 44 U/L   Alkaline Phosphatase 115 38 - 126 U/L   Total Bilirubin 1.0 0.3 - 1.2 mg/dL   GFR calc non Af Amer >60 >60 mL/min   GFR calc Af Amer >60 >60 mL/min   Anion  gap 10 5 - 15  Lipase, blood   Collection Time: 04/27/19  6:00 AM  Result Value Ref Range   Lipase 65 (H) 11 - 51 U/L  Urinalysis, Complete w Microscopic   Collection Time: 04/27/19  6:00 AM  Result Value Ref Range   Color, Urine AMBER (A) YELLOW   APPearance CLEAR (A) CLEAR   Specific Gravity, Urine 1.010 1.005 - 1.030   pH 5.0 5.0 - 8.0   Glucose, UA NEGATIVE NEGATIVE mg/dL   Hgb urine dipstick NEGATIVE NEGATIVE   Bilirubin Urine NEGATIVE NEGATIVE   Ketones, ur NEGATIVE NEGATIVE mg/dL   Protein, ur NEGATIVE NEGATIVE mg/dL   Nitrite POSITIVE (A) NEGATIVE   Leukocytes,Ua NEGATIVE NEGATIVE   RBC / HPF 0-5 0 - 5 RBC/hpf   WBC, UA 0-5 0 - 5 WBC/hpf   Bacteria, UA NONE SEEN NONE SEEN   Squamous Epithelial / LPF 0-5 0 - 5  Pregnancy, urine POC   Collection Time: 04/27/19  6:24 AM  Result Value Ref Range   Preg Test, Ur NEGATIVE NEGATIVE    Imaging Studies: US Transvaginal Non-OB  Result Date: 04/27/2019 CLINICAL DATA:  Pelvic pain EXAM: TRANSABDOMINAL AND TRANSVAGINAL ULTRASOUND OF PELVIS DOPPLER ULTRASOUND OF OVARIES TECHNIQUE: Both transabdominal and transvaginal ultrasound examinations of the pelvis were performed. Transabdominal technique was performed for global imaging of the pelvis including uterus, ovaries, adnexal regions, and pelvic cul-de-sac. It was necessary to proceed with  endovaginal exam following the transabdominal exam to visualize the ovaries. Color and duplex Doppler ultrasound was utilized to evaluate blood flow to the ovaries. COMPARISON:  Recent CT of the abdomen and pelvis. FINDINGS: Uterus Surgically removed Right ovary Not well visualized Left ovary Measurements: 7.4 x 4.9 x 5.3 cm. = volume: 99 mL. Multiple cystic lesions are noted within the left ovary similar to that seen on the prior CT examination. Some internal debris is identified. Pulsed Doppler evaluation of the left ovary demonstrates normal low-resistance arterial and venous waveforms. Other findings  Mild free fluid is noted. Some debris is noted within the free fluid in this could be related to hemorrhage. IMPRESSION: Multi cystic lesion within the left ovary with internal debris likely representing hemorrhage. Possible cystic ovarian neoplasm deserves consideration although no definitive malignant features are seen. Follow-up examination is recommended by means of ultrasound in 3 months to assess for stability/regression. Electronically Signed   By: Inez Catalina M.D.   On: 04/27/2019 11:55   US Pelvis Complete  Result Date: 04/27/2019 CLINICAL DATA:  Pelvic pain EXAM: TRANSABDOMINAL AND TRANSVAGINAL ULTRASOUND OF PELVIS DOPPLER ULTRASOUND OF OVARIES TECHNIQUE: Both transabdominal and transvaginal ultrasound examinations of the pelvis were performed. Transabdominal technique was performed for global imaging of the pelvis including uterus, ovaries, adnexal regions, and pelvic cul-de-sac. It was necessary to proceed with endovaginal exam following the transabdominal exam to visualize the ovaries. Color and duplex Doppler ultrasound was utilized to evaluate blood flow to the ovaries. COMPARISON:  Recent CT of the abdomen and pelvis. FINDINGS: Uterus Surgically removed Right ovary Not well visualized Left ovary Measurements: 7.4 x 4.9 x 5.3 cm. = volume: 99 mL. Multiple cystic lesions are noted within the left ovary similar to that seen on the prior CT examination. Some internal debris is identified. Pulsed Doppler evaluation of the left ovary demonstrates normal low-resistance arterial and venous waveforms. Other findings Mild free fluid is noted. Some debris is noted within the free fluid in this could be related to hemorrhage. IMPRESSION: Multi cystic lesion within the left ovary with internal debris likely representing hemorrhage. Possible cystic ovarian neoplasm deserves consideration although no definitive malignant features are seen. Follow-up examination is recommended by means of ultrasound in 3 months  to assess for stability/regression. Electronically Signed   By: Inez Catalina M.D.   On: 04/27/2019 11:55   CT ABDOMEN PELVIS W CONTRAST  Result Date: 04/27/2019 CLINICAL DATA:  Acute right lower quadrant abdominal pain. EXAM: CT ABDOMEN AND PELVIS WITH CONTRAST TECHNIQUE: Multidetector CT imaging of the abdomen and pelvis was performed using the standard protocol following bolus administration of intravenous contrast. CONTRAST:  154mL OMNIPAQUE IOHEXOL 300 MG/ML  SOLN COMPARISON:  July 14, 2018. FINDINGS: Lower chest: No acute abnormality. Hepatobiliary: No focal liver abnormality is seen. No gallstones, gallbladder wall thickening, or biliary dilatation. Pancreas: Unremarkable. No pancreatic ductal dilatation or surrounding inflammatory changes. Spleen: Normal in size without focal abnormality. Adrenals/Urinary Tract: Adrenal glands are unremarkable. Kidneys are normal, without renal calculi, focal lesion, or hydronephrosis. Bladder is unremarkable. Stomach/Bowel: Stomach is within normal limits. Appendix appears normal. No evidence of bowel wall thickening, distention, or inflammatory changes. Vascular/Lymphatic: No significant vascular findings are present. No enlarged abdominal or pelvic lymph nodes. Reproductive: Status post hysterectomy. No right adnexal abnormality is noted. There is interval development of left adnexal mass measuring 7.3 x 4.5 cm with multiple rounded cystic areas. This may represent cystic ovarian neoplasm, and further evaluation with ultrasound is recommended. Other: Mild amount of  free fluid is noted in the pelvis. Moderate size fat containing periumbilical hernia is noted. Musculoskeletal: No acute or significant osseous findings. IMPRESSION: 1. Moderate size fat containing periumbilical hernia. 2. Interval development of 7.3 x 4.5 cm left adnexal mass with multiple rounded cystic areas. This may represent cystic ovarian neoplasm, and further evaluation with ultrasound is  recommended. 3. Mild amount of free fluid is noted in the pelvis. Electronically Signed   By: Marijo Conception M.D.   On: 04/27/2019 10:25   [place] Art/Ven Flow Abd Pelv Doppler  Result Date: 04/27/2019 CLINICAL DATA:  Pelvic pain EXAM: TRANSABDOMINAL AND TRANSVAGINAL ULTRASOUND OF PELVIS DOPPLER ULTRASOUND OF OVARIES TECHNIQUE: Both transabdominal and transvaginal ultrasound examinations of the pelvis were performed. Transabdominal technique was performed for global imaging of the pelvis including uterus, ovaries, adnexal regions, and pelvic cul-de-sac. It was necessary to proceed with endovaginal exam following the transabdominal exam to visualize the ovaries. Color and duplex Doppler ultrasound was utilized to evaluate blood flow to the ovaries. COMPARISON:  Recent CT of the abdomen and pelvis. FINDINGS: Uterus Surgically removed Right ovary Not well visualized Left ovary Measurements: 7.4 x 4.9 x 5.3 cm. = volume: 99 mL. Multiple cystic lesions are noted within the left ovary similar to that seen on the prior CT examination. Some internal debris is identified. Pulsed Doppler evaluation of the left ovary demonstrates normal low-resistance arterial and venous waveforms. Other findings Mild free fluid is noted. Some debris is noted within the free fluid in this could be related to hemorrhage. IMPRESSION: Multi cystic lesion within the left ovary with internal debris likely representing hemorrhage. Possible cystic ovarian neoplasm deserves consideration although no definitive malignant features are seen. Follow-up examination is recommended by means of ultrasound in 3 months to assess for stability/regression. Electronically Signed   By: Inez Catalina M.D.   On: 04/27/2019 11:55    Assessment: Patient Active Problem List   Diagnosis Date Noted  . Iron deficiency anemia due to chronic blood loss 03/25/2014  . Abnormal uterine bleeding (AUB) 03/12/2014  . Bipolar affective disorder ([place]) 04/13/2012  .  Endometriosis 04/13/2012  . Migraine headache 04/13/2012  . Hepatitis C infection 03/10/2011  . HIV disease ([place]) 03/10/2011    Plan: Patient will undergo surgical management with laparoscopic ovarian cystectomy or oophorectomy (concurrent with Dr. Ines Bloomer umbilical hernia repair).   The risks of surgery were discussed in detail with the patient including but not limited to: bleeding which may require transfusion or reoperation; infection which may require antibiotics; injury to surrounding organs which may involve bowel, bladder, ureters ; need for additional procedures including laparoscopy or laparotomy; thromboembolic phenomenon, surgical site problems and other postoperative/anesthesia complications. Likelihood of success in alleviating the patient's condition was discussed. Routine postoperative instructions will be reviewed with the patient and her family in detail after surgery.  The patient concurred with the proposed plan, giving informed written consent for the surgery.  Patient has been NPO since last night she will remain NPO for procedure.  Anesthesia and OR aware.  Preoperative prophylactic antibiotics and SCDs ordered on call to the OR.  To OR when ready.  ----- Larey Days, MD, [employer] Attending Obstetrician and Gynecologist [facility], Department of [facility]

## 2019-05-04 NOTE — Anesthesia Preprocedure Evaluation (Signed)
Anesthesia Evaluation  Patient identified by MRN, date of birth, ID band Patient awake    Reviewed: Allergy & Precautions, H&P , NPO status , Patient's Chart, lab work & pertinent test results  History of Anesthesia Complications Negative for: history of anesthetic complications  Airway Mallampati: II  TM Distance: >3 FB Neck ROM: full    Dental  (+) Chipped, Poor Dentition   Pulmonary neg shortness of breath, asthma , former smoker,           Cardiovascular Exercise Tolerance: Good hypertension, (-) angina(-) Past MI and (-) DOE      Neuro/Psych  Headaches, PSYCHIATRIC DISORDERS negative psych ROS   GI/Hepatic GERD  Medicated and Controlled,(+) Hepatitis -, C  Endo/Other  negative endocrine ROS  Renal/GU      Musculoskeletal  (+) Arthritis ,   Abdominal   Peds  Hematology negative hematology ROS (+)   Anesthesia Other Findings Past Medical History: No date: Anemia     Comment:  iron infusions x 2 No date: Anxiety No date: Arthritis     Comment:  Neck, left shoulder No date: Asthma     Comment:  uses inhaler 2-3 x week No date: Bipolar disorder ([place]) No date: Depression No date: Endometriosis No date: Fibroids No date: GERD (gastroesophageal reflux disease) No date: Headache     Comment:  Migraines 2009: Hepatitis     Comment:  Hep C No date: HIV infection ([place]) No date: Hypertension No date: Pancreatitis No date: Seasonal allergies No date: SVD (spontaneous vaginal delivery)     Comment:  x 1  Past Surgical History: No date: ABDOMINAL HYSTERECTOMY No date: CESAREAN SECTION     Comment:  x2 No date: DILATION AND CURETTAGE OF UTERUS     Comment:  x 2 - TAB No date: HAND SURGERY; Right     Comment:  2 pins in wrist 10/11/2016: HYSTEROSCOPY; N/A     Comment:  Procedure: HYSTEROSCOPY WITH HYDROTHERMAL ABLATION;                Surgeon: Donnamae Jude, MD;  Location: [facility];  Service:  Gynecology;  Laterality: N/A;  Caryl Pina the HTA rep will be              here.  Confirmed on 09/02/16.  07/11/2018: LAPAROSCOPIC BILATERAL SALPINGECTOMY; Bilateral     Comment:  Procedure: LAPAROSCOPIC BILATERAL SALPINGECTOMY;                Surgeon: Ward, Honor Loh, MD;  Location: [facility];                Service: Gynecology;  Laterality: Bilateral; 07/11/2018: LAPAROSCOPIC HYSTERECTOMY; N/A     Comment:  Procedure: HYSTERECTOMY TOTAL LAPAROSCOPIC;  Surgeon:               Ward, Honor Loh, MD;  Location: [facility];  Service:               Gynecology;  Laterality: N/A; 10/11/2016: LAPAROSCOPIC TUBAL LIGATION; Bilateral     Comment:  Procedure: LAPAROSCOPIC TUBAL LIGATION WITH FILSHIE               CLIPS;  Surgeon: Donnamae Jude, MD;  Location: [facility];                Service: Gynecology;  Laterality: Bilateral; No date: MOUTH SURGERY [id number]: UMBILICAL HERNIA REPAIR; N/A     Comment:  Procedure: LAPAROSCOPIC UMBILICAL HERNIA REPAIR;  Surgeon: Benjamine Sprague, DO;  Location: [facility];  Service:              General;  Laterality: N/A; No date: WISDOM TOOTH EXTRACTION  BMI    Body Mass Index: 28.19 kg/m      Reproductive/Obstetrics negative OB ROS                             Anesthesia Physical Anesthesia Plan  ASA: III  Anesthesia Plan: General ETT   Post-op Pain Management:    Induction: Intravenous  PONV Risk Score and Plan: Ondansetron, Dexamethasone, Midazolam and Treatment may vary due to age or medical condition  Airway Management Planned: Oral ETT  Additional Equipment:   Intra-op Plan:   Post-operative Plan: Extubation in OR  Informed Consent: I have reviewed the patients History and Physical, chart, labs and discussed the procedure including the risks, benefits and alternatives for the proposed anesthesia with the patient or authorized representative who has indicated his/her understanding and acceptance.     Dental Advisory  Given  Plan Discussed with: Anesthesiologist, CRNA and Surgeon  Anesthesia Plan Comments: (Patient consented for risks of anesthesia including but not limited to:  - adverse reactions to medications - damage to teeth, lips or other oral mucosa - sore throat or hoarseness - Damage to heart, brain, lungs or loss of life  Patient voiced understanding.)        Anesthesia Quick Evaluation

## 2019-05-04 NOTE — Interval H&P Note (Signed)
History and Physical Interval Note:  05/04/2019 8:03 AM  Teresa [name]  has presented today for surgery, with the diagnosis of [id number] Recurrent umbilical hernia.    Developed pain from ovary vs cyst.  GYN will proceed with oophorectomy or cystectomy prior to umbilical hernia repair.   The various methods of treatment have been discussed with the patient and family. After consideration of risks, benefits and other options for treatment, the patient has consented to  Procedure(s): XI [place] (N/A) LAPAROSCOPIC OOPHORECTOMY (N/A) as a surgical intervention.  The patient's history has been reviewed, patient examined, no change in status, stable for surgery.  I have reviewed the patient's chart and labs.  Questions were answered to the patient's satisfaction.     Ayodele Hartsock Lysle Pearl

## 2019-05-04 NOTE — Op Note (Signed)
05/04/2019  PATIENT:  Teresa [name]  49 y.o. female  PRE-OPERATIVE DIAGNOSIS:  [id number] Recurrent umbilical hernia, 0000000 ovarian cyst  POST-OPERATIVE DIAGNOSIS:  [id number] Recurrent umbilical hernia, 0000000 ovarian cyst  PROCEDURE:  Procedure(s): XI ROBOT ASSISTED UMBILICAL HERNIA REPAIR (N/A) XI ROBOTIC ASSISTED LAPAROSCOPIC OVARIAN CYSTECTOMY (Left)  SURGEON:  Surgeon(s) and Role: Panel 1:    Lysle Pearl, Isami, DO - Primary    * Nakea Gouger, Honor Loh, MD Panel 2:    * Alekzander Cardell, Honor Loh, MD - Primary  ANESTHESIA: GET  EBL:  Minimal  DRAINS: none  SPECIMEN: ovarian cyst wall  DISPOSITION OF SPECIMEN:  To pathology  COUNTS: correct x2  COMPLICATIONS: none apparent  PATIENT DISPOSITION:  VS stable to PACU    Indication for surgery: Patient had presented with complaints of umbilical hernia, as well as left sided pelvic pain and imaging consistent with complex ovarian cyst. Patient was already scheduled for her umbilical hernia repair with Dr. Lysle Pearl.  Risks benefits and alternatives were reviewed and informed consent was obtained.   Procedure: The patient was brought to the OR and identified as Teresa [name].  She was given general anesthesia via endotracheal route.  Nasogastric tube was placed.  She was then positioned supine and prepped and draped in the usual sterile fashion.  A surgical time-out was called. A palmer's point incision was made and veress needle inserted and injected to confirm intra-abdominal position. .  Pneumoperitoneum was created to 43mmHg.  Three Robotic trochars were inserted atraumatically under visualization.  The patient was placed in steep trendelenburg, and the daVinci robot was docked and monopolar scissors and bipolar graspers were employed.  The sigmoid was adherent to the left pelvic wall, and these attachments were divided with scissors and cautery.  The ovary was tethered to the round ligament and this was divided as well.  Once the ovary was isolated, it  was determined that the "complex cyst" was actually several small cysts and one internal 3cm cyst.  This cyst was punctured with minimal clear fluid and the cyst wall was teased out in order to retain the integrity of the stroma and capsule.  The other smaller cysts were left alone.  They were all <1cm.  Bleeding points were cauterized.  Dr. Lysle Pearl assisted me with this portion of the case.  Following this, Dr. Lysle Pearl assumed control of the robotic console and proceeded with his umbilical hernia repair.  I did not remain in the OR after my portion of the case was complete.  See Dr. Ines Bloomer operative note for the remaining details.    Larey Days, MD Attending Obstetrician and Gynecologist [facility]

## 2019-05-04 NOTE — Anesthesia Procedure Notes (Signed)
Procedure Name: Intubation Date/Time: 05/04/2019 8:53 AM Performed by: Caryl Asp, CRNA Pre-anesthesia Checklist: Patient identified, Patient being monitored, Timeout performed, Emergency Drugs available and Suction available Patient Re-evaluated:Patient Re-evaluated prior to induction Oxygen Delivery Method: Circle system utilized Preoxygenation: Pre-oxygenation with 100% oxygen Induction Type: IV induction Ventilation: Mask ventilation without difficulty Laryngoscope Size: 3 and McGraph Grade View: Grade I Tube type: Oral Tube size: 7.0 mm Number of attempts: 1 Airway Equipment and Method: Stylet and Video-laryngoscopy Placement Confirmation: ETT inserted through vocal cords under direct vision,  positive ETCO2 and breath sounds checked- equal and bilateral Secured at: 21 cm Tube secured with: Tape Dental Injury: Teeth and Oropharynx as per pre-operative assessment

## 2019-05-06 ENCOUNTER — Emergency Department: Payer: Medicaid Other

## 2019-05-06 ENCOUNTER — Emergency Department
Admission: EM | Admit: 2019-05-06 | Discharge: 2019-05-06 | Disposition: A | Discharge status: Home / Self Care | Payer: Medicaid Other | Attending: Emergency Medicine | Admitting: Emergency Medicine

## 2019-05-06 ENCOUNTER — Other Ambulatory Visit: Payer: Self-pay

## 2019-05-06 DIAGNOSIS — R1031 Right lower quadrant pain: Secondary | ICD-10-CM | POA: Diagnosis present

## 2019-05-06 DIAGNOSIS — Z79899 Other long term (current) drug therapy: Secondary | ICD-10-CM | POA: Diagnosis not present

## 2019-05-06 DIAGNOSIS — J45909 Unspecified asthma, uncomplicated: Secondary | ICD-10-CM | POA: Diagnosis not present

## 2019-05-06 DIAGNOSIS — Z21 Asymptomatic human immunodeficiency virus [HIV] infection status: Secondary | ICD-10-CM | POA: Insufficient documentation

## 2019-05-06 DIAGNOSIS — Z87891 Personal history of nicotine dependence: Secondary | ICD-10-CM | POA: Insufficient documentation

## 2019-05-06 DIAGNOSIS — R1084 Generalized abdominal pain: Secondary | ICD-10-CM

## 2019-05-06 DIAGNOSIS — I10 Essential (primary) hypertension: Secondary | ICD-10-CM | POA: Diagnosis not present

## 2019-05-06 DIAGNOSIS — Z791 Long term (current) use of non-steroidal anti-inflammatories (NSAID): Secondary | ICD-10-CM | POA: Insufficient documentation

## 2019-05-06 LAB — CBC
HCT: 43.6 % (ref 36.0–46.0)
Hemoglobin: 14.5 g/dL (ref 12.0–15.0)
MCH: 29.8 pg (ref 26.0–34.0)
MCHC: 33.3 g/dL (ref 30.0–36.0)
MCV: 89.7 fL (ref 80.0–100.0)
Platelets: 190 10*3/uL (ref 150–400)
RBC: 4.86 MIL/uL (ref 3.87–5.11)
RDW: 13.1 % (ref 11.5–15.5)
WBC: 5.1 10*3/uL (ref 4.0–10.5)
nRBC: 0 % (ref 0.0–0.2)

## 2019-05-06 LAB — COMPREHENSIVE METABOLIC PANEL
ALT: 49 U/L — ABNORMAL HIGH (ref 0–44)
AST: 53 U/L — ABNORMAL HIGH (ref 15–41)
Albumin: 3.8 g/dL (ref 3.5–5.0)
Alkaline Phosphatase: 111 U/L (ref 38–126)
Anion gap: 11 (ref 5–15)
BUN: 7 mg/dL (ref 6–20)
CO2: 26 mmol/L (ref 22–32)
Calcium: 9.1 mg/dL (ref 8.9–10.3)
Chloride: 100 mmol/L (ref 98–111)
Creatinine, Ser: 0.75 mg/dL (ref 0.44–1.00)
GFR calc Af Amer: >60 mL/min (ref >60)
GFR calc non Af Amer: >60 mL/min (ref >60)
Glucose, Bld: 86 mg/dL (ref 70–99)
Potassium: 3.8 mmol/L (ref 3.5–5.1)
Sodium: 137 mmol/L (ref 135–145)
Total Bilirubin: 0.9 mg/dL (ref 0.3–1.2)
Total Protein: 7.5 g/dL (ref 6.5–8.1)

## 2019-05-06 MED ORDER — HYDROMORPHONE HCL 1 MG/ML IJ SOLN
1.0000 mg | Freq: Once | INTRAMUSCULAR | Status: AC
  Administered 2019-05-06: 1 mg via INTRAVENOUS
  Filled 2019-05-06: qty 1

## 2019-05-06 MED ORDER — HYDROMORPHONE HCL 1 MG/ML IJ SOLN: 1.0000 mg | Freq: Once | INTRAMUSCULAR | Status: AC

## 2019-05-06 MED ORDER — ONDANSETRON HCL 4 MG/2ML IJ SOLN
INTRAMUSCULAR | Status: AC
  Administered 2019-05-06: 06:00:00 4 mg via INTRAVENOUS
  Filled 2019-05-06: qty 2

## 2019-05-06 MED ORDER — ONDANSETRON HCL 4 MG/2ML IJ SOLN: 4.0000 mg | Freq: Once | INTRAMUSCULAR | Status: AC

## 2019-05-06 MED ORDER — HYDROMORPHONE HCL 1 MG/ML IJ SOLN
INTRAMUSCULAR | Status: AC
  Administered 2019-05-06: 1 mg via INTRAVENOUS
  Filled 2019-05-06: qty 1

## 2019-05-06 MED ORDER — IOHEXOL 300 MG/ML  SOLN
100.0000 mL | Freq: Once | INTRAMUSCULAR | Status: AC | PRN
  Administered 2019-05-06: 100 mL via INTRAVENOUS

## 2019-05-06 NOTE — ED Notes (Signed)
EMS reports patient to ED for abdominal pain after having a cyst removed on Friday. Patient with severe abdominal pain since that time.

## 2019-05-06 NOTE — ED Notes (Signed)
Pt in US

## 2019-05-06 NOTE — ED Notes (Signed)
CT aware of pt ready

## 2019-05-06 NOTE — ED Provider Notes (Signed)
Patient care assumed from Dr. Owens Shark.  I have personally seen and evaluated the patient as well.  Overall the patient appears well, no acute distress.  States her pain is improved although still present in the right lower quadrant.  Ultrasound shows no acute/concerning findings, CT scan shows no acute findings.  I discussed patient with Dr. Leonides Schanz of GYN who performed the patient surgery, she has called the patient and 20 additional Percocet for her pain control.  I discussed with the patient plenty of rest over the weekend, taking her pain medication as prescribed as well as return precautions for any worsening pain or failure of the pain to improve over the next 24 to 48 hours.  Patient agreeable to plan of care.   Harvest Dark, MD 05/06/19 [phone]

## 2019-05-06 NOTE — ED Notes (Signed)
Patient transported to CT 

## 2019-05-06 NOTE — ED Notes (Signed)
Printed and had pt sign hard copy for discharge d/t no signature pad

## 2019-05-06 NOTE — ED Provider Notes (Signed)
[facility] Emergency Department Provider Note  ____________________________________________   First MD Initiated Contact with Patient 05/06/19 [phone]     (approximate)  I have reviewed the triage vital signs and the nursing notes.   HISTORY  Chief Complaint Post-op Problem    HPI Teresa [name] is a 49 y.o. female with below list of previous medical conditions including hepatitis C, HIV hypertension and recently laparoscopic left ovarian cystectomy and ventral abdominal hernia repair on 05/04/2019 returns to the emergency department via EMS secondary to acute and progressive initial right lower quadrant  abdominal pain with current pain score of 10 out of 10 unrelieved with home medications.  Patient states that the pain began at 7 PM last night and is described as sharp.  Patient denies any nausea vomiting diarrhea or constipation.       Past Medical History:  Diagnosis Date  . Anemia    iron infusions x 2  . Anxiety   . Arthritis    Neck, left shoulder  . Asthma    uses inhaler 2-3 x week  . Bipolar disorder ([place])   . Depression   . Endometriosis   . Fibroids   . GERD (gastroesophageal reflux disease)   . Headache    Migraines  . Hepatitis 2009   Hep C  . HIV infection ([place])   . Hypertension   . Pancreatitis   . Seasonal allergies   . SVD (spontaneous vaginal delivery)    x 1    Patient Active Problem List   Diagnosis Date Noted  . Iron deficiency anemia due to chronic blood loss 03/25/2014  . Abnormal uterine bleeding (AUB) 03/12/2014  . Bipolar affective disorder ([place]) 04/13/2012  . Endometriosis 04/13/2012  . Migraine headache 04/13/2012  . Hepatitis C infection 03/10/2011  . HIV disease ([place]) 03/10/2011    Past Surgical History:  Procedure Laterality Date  . ABDOMINAL HYSTERECTOMY    . CESAREAN SECTION     x2  . DILATION AND CURETTAGE OF UTERUS     x 2 - TAB  . HAND SURGERY Right    2 pins in wrist  . HYSTEROSCOPY N/A  10/11/2016   Procedure: HYSTEROSCOPY WITH HYDROTHERMAL ABLATION;  Surgeon: Donnamae Jude, MD;  Location: [facility];  Service: Gynecology;  Laterality: N/A;  Caryl Pina the HTA rep will be here.  Confirmed on 09/02/16.   Marland Kitchen LAPAROSCOPIC BILATERAL SALPINGECTOMY Bilateral 07/11/2018   Procedure: LAPAROSCOPIC BILATERAL SALPINGECTOMY;  Surgeon: Ward, Honor Loh, MD;  Location: [facility];  Service: Gynecology;  Laterality: Bilateral;  . LAPAROSCOPIC HYSTERECTOMY N/A 07/11/2018   Procedure: HYSTERECTOMY TOTAL LAPAROSCOPIC;  Surgeon: Ward, Honor Loh, MD;  Location: [facility];  Service: Gynecology;  Laterality: N/A;  . LAPAROSCOPIC TUBAL LIGATION Bilateral 10/11/2016   Procedure: LAPAROSCOPIC TUBAL LIGATION WITH FILSHIE CLIPS;  Surgeon: Donnamae Jude, MD;  Location: [facility];  Service: Gynecology;  Laterality: Bilateral;  . MOUTH SURGERY    . UMBILICAL HERNIA REPAIR N/A 07/11/2018   Procedure: LAPAROSCOPIC UMBILICAL HERNIA REPAIR;  Surgeon: Benjamine Sprague, DO;  Location: [facility];  Service: General;  Laterality: N/A;  . WISDOM TOOTH EXTRACTION      Prior to Admission medications   Medication Sig Start Date End Date Taking? Authorizing Provider  abacavir-dolutegravir-lamiVUDine (TRIUMEQ) 600-50-300 MG tablet Take 1 tablet by mouth daily. 04/09/19   Tsosie Billing, MD  acetaminophen (TYLENOL) 325 MG tablet Take 2 tablets (650 mg total) by mouth every 8 (eight) hours as needed for mild pain. 05/04/19  06/03/19  Lysle Pearl, Isami, DO  albuterol (PROAIR HFA) 108 (90 Base) MCG/ACT inhaler Inhale 2 puffs into the lungs every 6 (six) hours as needed for wheezing or shortness of breath.     [provider]  buPROPion (WELLBUTRIN) 75 MG tablet Take 75 mg by mouth 2 (two) times daily.    [provider]  cetirizine (ZYRTEC) 10 MG tablet Take 10 mg by mouth daily as needed for allergies.     [provider]  diclofenac Sodium (VOLTAREN) 1 % GEL Apply 2 g topically daily as needed. 04/02/19   [provider]  docusate sodium (COLACE) 100 MG capsule Take 1 capsule (100 mg total) by mouth 2 (two) times daily as needed for up to 10 days for mild constipation. 05/04/19 05/14/19  Lysle Pearl, Isami, DO  fluticasone (FLONASE) 50 MCG/ACT nasal spray Place 1 spray into both nostrils daily as needed for allergies or rhinitis.     [provider]  Galcanezumab-gnlm (EMGALITY) 120 MG/ML SOSY Inject 120 mg into the skin every 28 (twenty-eight) days.    [provider]  HYDROcodone-acetaminophen (NORCO) 5-325 MG tablet Take 1 tablet by mouth every 6 (six) hours as needed for up to 6 doses for moderate pain. 05/04/19   Sakai, Isami, DO  lisinopril (ZESTRIL) 10 MG tablet Take 10 mg by mouth daily.    [provider]  meloxicam (MOBIC) 7.5 MG tablet Take 7.5 mg by mouth daily.    [provider]  Multiple Vitamin (MULTIVITAMIN WITH MINERALS) TABS tablet Take 2 tablets by mouth daily. Vita fusion    [provider]  nortriptyline (PAMELOR) 10 MG capsule Take 40 mg by mouth at bedtime.    [provider]  sertraline (ZOLOFT) 100 MG tablet Take 150 mg by mouth at bedtime.     [provider]  SUMAtriptan (IMITREX) 100 MG tablet Take 100 mg by mouth every 2 (two) hours as needed for migraine. May repeat in 2 hours if headache persists or recurs.     [provider]    Allergies Naproxen sodium  Family History  Problem Relation Age of Onset  . Breast cancer Paternal Grandmother   . Breast cancer Cousin     Social History Social History   Tobacco Use  . Smoking status: Former Smoker    Packs/day: 0.25    Years: 25.00    Pack years: 6.25    Types: Cigarettes  . Smokeless tobacco: Never Used  . Tobacco comment: uses vapor occasionally  Substance Use Topics  . Alcohol use: Yes    Comment: wine occ  . Drug use: Yes    Types: Marijuana    Comment: 2-3 x per month    Review of Systems  Constitutional: No fever/chills Eyes: No  visual changes. ENT: No sore throat. Cardiovascular: Denies chest pain. Respiratory: Denies shortness of breath. Gastrointestinal: Positive for abdominal pain.  No nausea vomiting diarrhea constipation. Genitourinary: Negative for dysuria. Musculoskeletal: Negative for neck pain.  Negative for back pain. Integumentary: Negative for rash. Neurological: Negative for headaches, focal weakness or numbness.  ____________________________________________   PHYSICAL EXAM:  VITAL SIGNS: ED Triage Vitals  Enc Vitals Group     BP --      Pulse --      Resp --      Temp --      Temp Source 05/06/19 0645 Oral     SpO2 --      Weight 05/06/19 0625 81.6 kg (179 lb  14.3 oz)     Height 05/06/19 0625 1.702 m (5\' 7" )     Head Circumference --      Peak Flow --      Pain Score 05/06/19 0625 10     Pain Loc --      Pain Edu? --      Excl. in [place]? --     Constitutional: Alert and oriented.  Apparent discomfort Eyes: Conjunctivae are normal.  Mouth/Throat: Patient is wearing a mask. Neck: No stridor.  No meningeal signs.   Cardiovascular: Normal rate, regular rhythm. Good peripheral circulation. Grossly normal heart sounds. Respiratory: Normal respiratory effort.  No retractions. Gastrointestinal: Generalized tenderness to palpation.. No distention.  Musculoskeletal: No lower extremity tenderness nor edema. No gross deformities of extremities. Neurologic:  Normal speech and language. No gross focal neurologic deficits are appreciated.  Skin:  Skin is warm, dry and intact. Psychiatric: Mood and affect are normal. Speech and behavior are normal.  ____________________________________________   LABS (all labs ordered are listed, but only abnormal results are displayed)  Labs Reviewed  CBC  COMPREHENSIVE METABOLIC PANEL  URINALYSIS, COMPLETE (UACMP) WITH MICROSCOPIC      Official radiology report(s): No results  found.  ____________________________________________   PROCEDURES   Procedure(s) performed (including Critical Care):  Procedures   ____________________________________________   INITIAL IMPRESSION / MDM / ASSESSMENT AND PLAN / ED COURSE  As part of my medical decision making, I reviewed the following data within the electronic MEDICAL RECORD NUMBER    49 year old female presented with above-stated history and physical exam secondary to generalized abdominal pain status post laparoscopic ventral abdominal hernia and left ovarian cystectomy.  Patient given IV Dilaudid 1 mg and IV Zofran 4 mg.  CT scan pending at this time.  Patient care transferred to Dr. Kerman Passey ____________________________________________  FINAL CLINICAL IMPRESSION(S) / ED DIAGNOSES  Final diagnoses:  Generalized abdominal pain     MEDICATIONS GIVEN DURING THIS VISIT:  Medications  HYDROmorphone (DILAUDID) injection 1 mg (1 mg Intravenous Given 05/06/19 0624)  ondansetron (ZOFRAN) injection 4 mg (4 mg Intravenous Given 05/06/19 [id number])     ED Discharge Orders    None      *Please note:  Teresa [name] was evaluated in Emergency Department on 05/06/2019 for the symptoms described in the history of present illness. She was evaluated in the context of the global COVID-19 pandemic, which necessitated consideration that the patient might be at risk for infection with the SARS-CoV-2 virus that causes COVID-19. Institutional protocols and algorithms that pertain to the evaluation of patients at risk for COVID-19 are in a state of rapid change based on information released by regulatory bodies including the CDC and federal and state organizations. These policies and algorithms were followed during the patient's care in the ED.  Some ED evaluations and interventions may be delayed as a result of limited staffing during the pandemic.*  Note:  This document was prepared using Dragon voice recognition software and may  include unintentional dictation errors.   Gregor Hams, MD 05/07/19 [phone]

## 2019-05-06 NOTE — ED Notes (Signed)
Pt given water 

## 2019-05-06 NOTE — ED Notes (Signed)
Apolonio Schneiders RN attempted to get IV access after the first IV had come out- RN was unsuccessful

## 2019-05-06 NOTE — ED Triage Notes (Signed)
Pt to ED via EMS from home c/o right lower abd pain. Pt had a ovarian cyst and hernia repair on 1/8. Pt states pain started last night around 7pm, sharp pain. Denies any bleeding.

## 2019-05-07 LAB — SURGICAL PATHOLOGY

## 2019-05-07 NOTE — Anesthesia Postprocedure Evaluation (Signed)
Anesthesia Post Note  Patient: Teresa [name]  Procedure(s) Performed: XI ROBOT ASSISTED UMBILICAL HERNIA REPAIR (N/A Abdomen) XI ROBOTIC ASSISTED LAPAROSCOPIC OVARIAN CYSTECTOMY (Left )  Patient location during evaluation: PACU Anesthesia Type: General Level of consciousness: awake and alert Pain management: pain level controlled Vital Signs Assessment: post-procedure vital signs reviewed and stable Respiratory status: spontaneous breathing, nonlabored ventilation, respiratory function stable and patient connected to nasal cannula oxygen Cardiovascular status: blood pressure returned to baseline and stable Postop Assessment: no apparent nausea or vomiting Anesthetic complications: no     Last Vitals:  Vitals:   05/04/19 1553 05/04/19 1612  BP: (!) 141/94 (!) 149/97  Pulse: 84 87  Resp:  18  Temp:    SpO2: 100% 100%    Last Pain:  Vitals:   05/04/19 1612  TempSrc:   PainSc: 5                  Precious Haws Madelyn Tlatelpa

## 2019-05-10 ENCOUNTER — Telehealth: Payer: Medicaid Other | Admitting: Physician Assistant

## 2019-05-10 ENCOUNTER — Ambulatory Visit
Admission: RE | Admit: 2019-05-10 | Discharge: 2019-05-10 | Disposition: A | Discharge status: Home / Self Care | Payer: Medicaid Other | Source: Ambulatory Visit

## 2019-05-10 DIAGNOSIS — K1379 Other lesions of oral mucosa: Secondary | ICD-10-CM | POA: Diagnosis not present

## 2019-05-10 NOTE — Progress Notes (Signed)
Based on what you shared with me, I feel your condition warrants further evaluation and I recommend that you be seen for a face to face office visit.  I am concerned that your headache and mouth pain could be due to a more serious cause, which would best be evaluated in person such as at an urgent care.   NOTE: If you entered your credit card information for this eVisit, you will not be charged. You may see a "hold" on your card for the $35 but that hold will drop off and you will not have a charge processed.   If you are having a true medical emergency please call 911.      For an urgent face to face visit, [facility] has five urgent care centers for your convenience:      NEW:  [facility] [place] [facility] [place] Get Driving Directions [id number] [place], [place] [address] . 10 am - 6pm Monday - Friday    [place] [facility]) Get Driving Directions [id number] [address] [place], [place] [address] . 10 am to 8 pm Monday-Friday . 12 pm to 8 pm [facility] at [facility] Get Driving Directions [id number] [place], [place], [place] [address] . 8 am to 8 pm Monday-Friday . 9 am to 6 pm Saturday . 11 am to 6 pm Sunday     [facility] at [facility] Get Driving Directions  [id number] [address] [place], [place] [address] . 8 am to 8 pm Monday-Friday . 8 am to 4 pm [facility] at [place] Get Driving Directions [id number] [place], [place] [address] . 12 pm to 6 pm Monday-Friday      Your e-visit answers were reviewed by a board certified advanced clinical practitioner to complete your personal care plan.  Thank you for using e-Visits.  Greater than 5 minutes, yet less than 10 minutes of time have been spent researching, coordinating, and implementing care  for this patient today.

## 2019-05-16 ENCOUNTER — Other Ambulatory Visit: Payer: Self-pay

## 2019-05-16 DIAGNOSIS — R102 Pelvic and perineal pain unspecified side: Secondary | ICD-10-CM | POA: Insufficient documentation

## 2019-05-17 ENCOUNTER — Other Ambulatory Visit: Payer: Self-pay

## 2019-05-17 ENCOUNTER — Ambulatory Visit: Payer: Medicaid Other | Attending: Infectious Diseases | Admitting: Infectious Diseases

## 2019-05-17 DIAGNOSIS — B20 Human immunodeficiency virus [HIV] disease: Secondary | ICD-10-CM

## 2019-05-17 DIAGNOSIS — Z9889 Other specified postprocedural states: Secondary | ICD-10-CM

## 2019-05-17 DIAGNOSIS — I10 Essential (primary) hypertension: Secondary | ICD-10-CM

## 2019-05-17 DIAGNOSIS — B182 Chronic viral hepatitis C: Secondary | ICD-10-CM

## 2019-05-17 DIAGNOSIS — N920 Excessive and frequent menstruation with regular cycle: Secondary | ICD-10-CM

## 2019-05-17 DIAGNOSIS — Z87891 Personal history of nicotine dependence: Secondary | ICD-10-CM

## 2019-05-17 DIAGNOSIS — D219 Benign neoplasm of connective and other soft tissue, unspecified: Secondary | ICD-10-CM

## 2019-05-17 DIAGNOSIS — Z90710 Acquired absence of both cervix and uterus: Secondary | ICD-10-CM

## 2019-05-17 DIAGNOSIS — K146 Glossodynia: Secondary | ICD-10-CM

## 2019-05-17 DIAGNOSIS — B1920 Unspecified viral hepatitis C without hepatic coma: Secondary | ICD-10-CM

## 2019-05-17 DIAGNOSIS — N946 Dysmenorrhea, unspecified: Secondary | ICD-10-CM

## 2019-05-17 DIAGNOSIS — N809 Endometriosis, unspecified: Secondary | ICD-10-CM

## 2019-05-17 DIAGNOSIS — Z79899 Other long term (current) drug therapy: Secondary | ICD-10-CM

## 2019-05-17 DIAGNOSIS — Z886 Allergy status to analgesic agent status: Secondary | ICD-10-CM

## 2019-05-17 NOTE — Progress Notes (Signed)
The purpose of this virtual visit is to provide medical care while limiting exposure to the novel coronavirus (COVID19) for both patient and office staff.   Consent was obtained for video visit:  Yes.   Answered questions that patient had about telehealth interaction:  Yes.   I discussed the limitations, risks, security and privacy concerns of performing an evaluation and management service by video. My staff also discussed with the patient that there may be a patient responsible charge related to this service. The patient expressed understanding and agreed to proceed.   Patient Location: Home Provider Location:office  NAME: Teresa [name]  DOB: [birth year]  MRN: [id number]  Date/Time: 05/17/2019 9:59 AM   Subjective:   ? Teresa [name] is a 49 y.o. female with a history of HIV diagnosed during her pregnancy in 2009  Last visit in person was on 01/2019 Pt also has HEPC .During that visit labs sent revealed AFP of 22, HCV VL of 16 million copies She was referred to hepatologist Dr.Anna and he has ordered MRI of the liver and repeat AFP, GGT and LFT Meanwhile patient had Robotic laparoscopic repair of umbilical hernia by Dr.Sakai on 05/04/19. At the same time Dr.Ward did the robotic laparoscopic left ovarian cystectomy Pathology of the cyst was FRAGMENTS OF BENIGN FIBROCONNECTIVE TISSUE, COMPATIBLE WITH SIMPLE  CYST WALL.  - NEGATIVE FOR ATYPIA AND MALIGNANCY. Pt is doing better  The wound has healed well Some soreness She saw her PCP on 1/19 for burning mouth and tongue and was given fluconazole- she has taken both fluconazole and nystatin with no relief She has an appt with dentist next week She sucks ice, cinnamon disks for dry moth and I told her that can cause follicular damage of the tongue papillae and worsen the burning She is 100% adherent to HAART- takes truimeq Also Her BP med lisinopril was increased to 20mg  by her PCP. She says she is documenting her BP every day and will  report to her PCP   HAARt history Truvada  isentress Triumeq Acquired thru-heterosexual contact Genotype-06/01/16 NRTI AbacavirZiagen Sensitive  RAMs*: None DidanosineVidexSensitive  RAMs*: None Emtricitabine EmtrivaSensitive  RAMs*: None LamivudineEpivir Sensitive  RAMs*: None Stavudine ZeritSensitive  RAMs*: None Tenofovir Viread Sensitive  RAMs*: None ZidovudineRetrovir Sensitive  RAMs*: None NNRTI Efavirenz SustivaSensitive  RAMs*: None EtravirineIntelenceSensitive  RAMs*: None NevirapineViramune Sensitive  RAMs*: None Rilpivirine EdurantSensitive  RAMs*: None INI DolutegravirTivicaySensitive  RAMs*: L74M, T97A ElvitegravirVitekta Resistance Possible1  RAMs*: L74M, T97A Raltegravir Isentress Resistance Possible1  RAMs*: L74M, T97A PI AtazanavirReyatazSensitive  RAMs*: I62V Atazanavir/rReyataz / rSensitive  RAMs*: I62V Darunavir/r Prezista / r Sensitive  RAMs*: None Fosamprenavir/r Lexiva / r Sensitive  RAMs*: None Indinavir/r Crixivan / r Sensitive  RAMs*: None Lopinavir KaletraSensitive  RAMs*: None NelfinavirViracept Sensitive  RAMs*: None Ritonavir Norvir Sensitive  RAMs*: None Saquinavir/rInvirase / r Sensitive  RAMs*: I62V Tipranavir/rAptivus / rSensitive  RAMs*: None *RAMs = Resistance Associated Mutations observed Summary of Mutations Observed: RT: V35E, T39K, Q102K, D123E, C162S, E169D, D177E, G196E, I202V, F214L, A272P, R277K, V293I, E297K IN: S17N, V72I, L74M, T97A, V113I, T125V, T206S, V234L, D256E PR: R41K, I62V, L63P, I64M   PMH . Hepatitis C infection  . Endometriosis  . Migraine headache  . Bipolar affective disorder (CMS-HCC)  .  Abnormal uterine bleeding (AUB), unspecified  . Iron deficiency anemia due to chronic blood loss  Pancreatitis Fibroid ?Asthma Past Surgical History:  Procedure Laterality Date  . ABDOMINAL HYSTERECTOMY    . CESAREAN SECTION     x2  .  DILATION AND CURETTAGE OF UTERUS     x 2 - TAB  . HAND SURGERY Right    2 pins in wrist  . HYSTEROSCOPY N/A 10/11/2016   Procedure: HYSTEROSCOPY WITH HYDROTHERMAL ABLATION;  Surgeon: Donnamae Jude, MD;  Location: [facility];  Service: Gynecology;  Laterality: N/A;  Caryl Pina the HTA rep will be here.  Confirmed on 09/02/16.   Marland Kitchen LAPAROSCOPIC BILATERAL SALPINGECTOMY Bilateral 07/11/2018   Procedure: LAPAROSCOPIC BILATERAL SALPINGECTOMY;  Surgeon: Ward, Honor Loh, MD;  Location: [facility];  Service: Gynecology;  Laterality: Bilateral;  . LAPAROSCOPIC HYSTERECTOMY N/A 07/11/2018   Procedure: HYSTERECTOMY TOTAL LAPAROSCOPIC;  Surgeon: Ward, Honor Loh, MD;  Location: [facility];  Service: Gynecology;  Laterality: N/A;  . LAPAROSCOPIC TUBAL LIGATION Bilateral 10/11/2016   Procedure: LAPAROSCOPIC TUBAL LIGATION WITH FILSHIE CLIPS;  Surgeon: Donnamae Jude, MD;  Location: [facility];  Service: Gynecology;  Laterality: Bilateral;  . MOUTH SURGERY    . ROBOTIC ASSISTED LAPAROSCOPIC OVARIAN CYSTECTOMY Left 05/04/2019   Procedure: XI ROBOTIC ASSISTED LAPAROSCOPIC OVARIAN CYSTECTOMY;  Surgeon: Ward, Honor Loh, MD;  Location: [facility];  Service: Gynecology;  Laterality: Left;  . UMBILICAL HERNIA REPAIR N/A 07/11/2018   Procedure: LAPAROSCOPIC UMBILICAL HERNIA REPAIR;  Surgeon: Benjamine Sprague, DO;  Location: [facility];  Service: General;  Laterality: N/A;  . WISDOM TOOTH EXTRACTION       SH Former smoker Former drug use Lives with her daughter who is 40 years old.  Also has 2 other  adult children age 14 and 5.   FH High blood pressure (Hypertension) Mother  . Diabetes type II Mother  . Lung cancer Father  . Lung cancer Maternal Grandmother  . Stomach cancer Paternal Grandmother      Allergies  Allergen Reactions  . Naproxen Sodium Hives    Can take ibuprofen without causing any hives   ? Current Outpatient Medications  Medication Sig Dispense Refill  . abacavir-dolutegravir-lamiVUDine (TRIUMEQ) 600-50-300 MG tablet Take 1 tablet by mouth daily. 30 tablet 3  . acetaminophen (TYLENOL) 325 MG tablet Take 2 tablets (650 mg total) by mouth every 8 (eight) hours as needed for mild pain. 40 tablet 0  . albuterol (PROAIR HFA) 108 (90 Base) MCG/ACT inhaler Inhale 2 puffs into the lungs every 6 (six) hours as needed for wheezing or shortness of breath.     Marland Kitchen buPROPion (WELLBUTRIN) 75 MG tablet Take 75 mg by mouth 2 (two) times daily.    . cetirizine (ZYRTEC) 10 MG tablet Take 10 mg by mouth daily as needed for allergies.     Marland Kitchen diclofenac Sodium (VOLTAREN) 1 % GEL Apply 2 g topically daily as needed.    . fluconazole (DIFLUCAN) 100 MG tablet Take by mouth.    . fluticasone (FLONASE) 50 MCG/ACT nasal spray Place 1 spray into both nostrils daily as needed for allergies or rhinitis.     . Galcanezumab-gnlm (EMGALITY) 120 MG/ML SOSY Inject 120 mg into the skin every 28 (twenty-eight) days.    Marland Kitchen lisinopril (ZESTRIL) 10 MG tablet Take 10 mg by mouth daily.    . meloxicam (MOBIC) 7.5 MG tablet Take 7.5 mg by mouth daily.    . Multiple Vitamin (MULTIVITAMIN WITH MINERALS) TABS tablet Take 2 tablets by mouth daily. Vita fusion    . sertraline (ZOLOFT) 100 MG tablet Take 150 mg by mouth at bedtime.     . SUMAtriptan (IMITREX) 100 MG tablet Take by mouth.     No current facility-administered medications for this visit.  REVIEW OF SYSTEMS:  Const: negative fever, negative chills, negative weight loss Eyes: negative diplopia or visual changes, negative eye pain ENT: burning tongue, dry mouth Resp: negative cough, hemoptysis, dyspnea Cards: negative for chest pain, palpitations, lower extremity edema GU: negative for frequency, dysuria and hematuria Skin: negative for rash and  pruritus Heme: negative for easy bruising and gum/nose bleeding MS: negative for myalgias, arthralgias, back pain and muscle weakness Neurolo: Persistent headaches, dizziness,  Psych: Anxiety and depression  Objective:  VITALS: not done as televist  No physical examination General: Alert, cooperative, no distress, appears stated age.  Tongue and mouth no white patch or redness  Pertinent Labs Health maintenance Vaccination  Vaccine Date last given comment  Influenza    Hepatitis B 02/24/2009, 09/11/2008, 06/26/2008 [facility]  Hepatitis A    Prevnar-PCV-13    Pneumovac-PPSV-23    TdaP 03/10/2011 [facility]  HPV    Shingrix ( zoster vaccine)     ______________________  Labs Lab Result  Date comment  HIV VL <20  02/13/19   CD4 640 ( 45%) 02/13/19   Genotype L74M, T97A R to Ral and Elvi  06/01/2018   [id number]     HIV antibody     RPR  NR  06/01/2018   Quantiferon Gold  negative  06/01/2018   Hep C ab  16 million  02/13/19   Hepatitis B-ab,ag,c     Hepatitis A-IgM, IgG /T     Lipid     GC/CHL     PAP     HB,PLT,Cr, LFT 12/189/0.8/49/38 06/01/18     Preventive  Procedure Result  Date comment  colonoscopy     Mammogram Probably benign bilateral breast calcifications stable for over 28yr  02/14/17   Dental exam     Opthal       Impression/Recommendation ?49 year old female follow-up visit for HIV  HIV/ AIDS: On Triumeq.  Last Cd4 is 640 and Vl < 20- shared th results with her - 100% adherent  Genotype has resistance to raltegravir and elvitegravir- bt sensitive to [employer] and Dolutegravir  Hypertension: on lisinopril 20 mg- follwoed by Dr.Johnston  Hepatitis C not on treatment yet.  AFP high at 83- saw Dr.Anna- MRI and repeat labs have been ordered- MRI appt on 05/25/19- Informed patient to do MRI and also labs that were ordered by Dr.Anna in Dec Her follow up appt with him is on 05/30/19  Burning tongue and dry mouth- Anti fungal have not worked as it is  not yeast infection. The burning is likely coming from her sucking on ice, cinnamon disks , sour drops for dry mouth . Told her not to suck on those. She has an appt with dentist next week as welll  __Dysmenorrhea, menorrhagia, endometriosis and fibroids.  Status post hysterectomy. Recent left cystectomy by lap/rob- on 05/04/19- benign Recent umbilical hernia repair ( 2nd time) on 05/04/19 by Dr.Sakai    She will need Prevnar/Pneumovax next visit  Follow-up after her work up with Dr.Anna  Discussed the management with the patient in great detail.

## 2019-05-24 ENCOUNTER — Emergency Department: Payer: Medicaid Other

## 2019-05-24 ENCOUNTER — Other Ambulatory Visit: Payer: Self-pay

## 2019-05-24 ENCOUNTER — Inpatient Hospital Stay
Admission: EM | Admit: 2019-05-24 | Discharge: 2019-05-27 | DRG: 305 | Disposition: A | Discharge status: Home / Self Care | Payer: Medicaid Other | Attending: Internal Medicine | Admitting: Internal Medicine

## 2019-05-24 ENCOUNTER — Encounter: Payer: Self-pay | Admitting: Emergency Medicine

## 2019-05-24 DIAGNOSIS — I10 Essential (primary) hypertension: Secondary | ICD-10-CM

## 2019-05-24 DIAGNOSIS — B20 Human immunodeficiency virus [HIV] disease: Secondary | ICD-10-CM

## 2019-05-24 DIAGNOSIS — R519 Headache, unspecified: Secondary | ICD-10-CM

## 2019-05-24 DIAGNOSIS — F418 Other specified anxiety disorders: Secondary | ICD-10-CM

## 2019-05-24 DIAGNOSIS — F32A Depression, unspecified: Secondary | ICD-10-CM | POA: Diagnosis present

## 2019-05-24 DIAGNOSIS — I160 Hypertensive urgency: Secondary | ICD-10-CM | POA: Diagnosis not present

## 2019-05-24 DIAGNOSIS — J45909 Unspecified asthma, uncomplicated: Secondary | ICD-10-CM | POA: Diagnosis present

## 2019-05-24 DIAGNOSIS — Z79899 Other long term (current) drug therapy: Secondary | ICD-10-CM

## 2019-05-24 DIAGNOSIS — Z803 Family history of malignant neoplasm of breast: Secondary | ICD-10-CM

## 2019-05-24 DIAGNOSIS — Z791 Long term (current) use of non-steroidal anti-inflammatories (NSAID): Secondary | ICD-10-CM

## 2019-05-24 DIAGNOSIS — F319 Bipolar disorder, unspecified: Secondary | ICD-10-CM | POA: Diagnosis present

## 2019-05-24 DIAGNOSIS — M1990 Unspecified osteoarthritis, unspecified site: Secondary | ICD-10-CM | POA: Diagnosis present

## 2019-05-24 DIAGNOSIS — Z87891 Personal history of nicotine dependence: Secondary | ICD-10-CM

## 2019-05-24 DIAGNOSIS — Z20822 Contact with and (suspected) exposure to covid-19: Secondary | ICD-10-CM | POA: Diagnosis present

## 2019-05-24 DIAGNOSIS — G43909 Migraine, unspecified, not intractable, without status migrainosus: Secondary | ICD-10-CM | POA: Diagnosis present

## 2019-05-24 DIAGNOSIS — B1920 Unspecified viral hepatitis C without hepatic coma: Secondary | ICD-10-CM | POA: Diagnosis present

## 2019-05-24 DIAGNOSIS — Z886 Allergy status to analgesic agent status: Secondary | ICD-10-CM

## 2019-05-24 DIAGNOSIS — K219 Gastro-esophageal reflux disease without esophagitis: Secondary | ICD-10-CM | POA: Diagnosis present

## 2019-05-24 LAB — CBC WITH DIFFERENTIAL/PLATELET
Abs Immature Granulocytes: 0 10*3/uL (ref 0.00–0.07)
Basophils Absolute: 0.1 10*3/uL (ref 0.0–0.1)
Basophils Relative: 2 %
Eosinophils Absolute: 0.2 10*3/uL (ref 0.0–0.5)
Eosinophils Relative: 6 %
HCT: 42 % (ref 36.0–46.0)
Hemoglobin: 14.1 g/dL (ref 12.0–15.0)
Immature Granulocytes: 0 %
Lymphocytes Relative: 40 %
Lymphs Abs: 1.5 10*3/uL (ref 0.7–4.0)
MCH: 29.9 pg (ref 26.0–34.0)
MCHC: 33.6 g/dL (ref 30.0–36.0)
MCV: 89.2 fL (ref 80.0–100.0)
Monocytes Absolute: 0.3 10*3/uL (ref 0.1–1.0)
Monocytes Relative: 9 %
Neutro Abs: 1.6 10*3/uL — ABNORMAL LOW (ref 1.7–7.7)
Neutrophils Relative %: 43 %
Platelets: 181 10*3/uL (ref 150–400)
RBC: 4.71 MIL/uL (ref 3.87–5.11)
RDW: 12.8 % (ref 11.5–15.5)
WBC: 3.6 10*3/uL — ABNORMAL LOW (ref 4.0–10.5)
nRBC: 0 % (ref 0.0–0.2)

## 2019-05-24 LAB — COMPREHENSIVE METABOLIC PANEL
ALT: 63 U/L — ABNORMAL HIGH (ref 0–44)
AST: 74 U/L — ABNORMAL HIGH (ref 15–41)
Albumin: 3.8 g/dL (ref 3.5–5.0)
Alkaline Phosphatase: 144 U/L — ABNORMAL HIGH (ref 38–126)
Anion gap: 8 (ref 5–15)
BUN: 9 mg/dL (ref 6–20)
CO2: 27 mmol/L (ref 22–32)
Calcium: 8.9 mg/dL (ref 8.9–10.3)
Chloride: 103 mmol/L (ref 98–111)
Creatinine, Ser: 0.66 mg/dL (ref 0.44–1.00)
GFR calc Af Amer: >60 mL/min (ref >60)
GFR calc non Af Amer: >60 mL/min (ref >60)
Glucose, Bld: 110 mg/dL — ABNORMAL HIGH (ref 70–99)
Potassium: 3.9 mmol/L (ref 3.5–5.1)
Sodium: 138 mmol/L (ref 135–145)
Total Bilirubin: 0.7 mg/dL (ref 0.3–1.2)
Total Protein: 7.7 g/dL (ref 6.5–8.1)

## 2019-05-24 LAB — TROPONIN I (HIGH SENSITIVITY): Troponin I (High Sensitivity): 3 ng/L (ref <18)

## 2019-05-24 LAB — SARS CORONAVIRUS 2 (TAT 6-24 HRS): SARS Coronavirus 2: NEGATIVE

## 2019-05-24 MED ORDER — MORPHINE SULFATE (PF) 4 MG/ML IV SOLN
4.0000 mg | Freq: Once | INTRAVENOUS | Status: AC
  Administered 2019-05-24: 09:00:00 4 mg via INTRAVENOUS
  Filled 2019-05-24: qty 1

## 2019-05-24 MED ORDER — HYDRALAZINE HCL 20 MG/ML IJ SOLN
5.0000 mg | INTRAMUSCULAR | Status: DC | PRN
  Administered 2019-05-24: 13:00:00 5 mg via INTRAVENOUS
  Filled 2019-05-24: qty 1

## 2019-05-24 MED ORDER — ABACAVIR-DOLUTEGRAVIR-LAMIVUD 600-50-300 MG PO TABS
1.0000 | ORAL_TABLET | Freq: Every day | ORAL | Status: DC
  Administered 2019-05-25 – 2019-05-27 (×3): 1 via ORAL
  Filled 2019-05-24 (×4): qty 1

## 2019-05-24 MED ORDER — FLUCONAZOLE 100 MG PO TABS
100.0000 mg | ORAL_TABLET | Freq: Every day | ORAL | Status: DC
  Administered 2019-05-24 – 2019-05-27 (×4): 100 mg via ORAL
  Filled 2019-05-24 (×4): qty 1

## 2019-05-24 MED ORDER — BUPROPION HCL 75 MG PO TABS
150.0000 mg | ORAL_TABLET | Freq: Every day | ORAL | Status: DC
  Administered 2019-05-25 – 2019-05-27 (×3): 150 mg via ORAL
  Filled 2019-05-24 (×3): qty 2

## 2019-05-24 MED ORDER — SUMATRIPTAN SUCCINATE 50 MG PO TABS
50.0000 mg | ORAL_TABLET | ORAL | Status: DC | PRN
  Administered 2019-05-24 – 2019-05-27 (×6): 50 mg via ORAL
  Filled 2019-05-24 (×9): qty 1

## 2019-05-24 MED ORDER — AMLODIPINE BESYLATE 10 MG PO TABS
10.0000 mg | ORAL_TABLET | Freq: Every day | ORAL | Status: DC
  Administered 2019-05-24 – 2019-05-26 (×3): 10 mg via ORAL
  Filled 2019-05-24: qty 1
  Filled 2019-05-24: qty 2
  Filled 2019-05-24: qty 1

## 2019-05-24 MED ORDER — METOCLOPRAMIDE HCL 5 MG/ML IJ SOLN
10.0000 mg | Freq: Once | INTRAMUSCULAR | Status: AC
  Administered 2019-05-24: 09:00:00 10 mg via INTRAVENOUS
  Filled 2019-05-24: qty 2

## 2019-05-24 MED ORDER — ALBUTEROL SULFATE (2.5 MG/3ML) 0.083% IN NEBU: 2.5000 mg | INHALATION_SOLUTION | RESPIRATORY_TRACT | Status: DC | PRN

## 2019-05-24 MED ORDER — LISINOPRIL 20 MG PO TABS
20.0000 mg | ORAL_TABLET | Freq: Every day | ORAL | Status: DC
  Administered 2019-05-24 – 2019-05-25 (×2): 20 mg via ORAL
  Filled 2019-05-24: qty 1
  Filled 2019-05-24: qty 2

## 2019-05-24 MED ORDER — ONDANSETRON HCL 4 MG/2ML IJ SOLN
4.0000 mg | Freq: Three times a day (TID) | INTRAMUSCULAR | Status: DC | PRN
  Administered 2019-05-25: 13:00:00 4 mg via INTRAVENOUS
  Filled 2019-05-24: qty 2

## 2019-05-24 MED ORDER — DIPHENHYDRAMINE HCL 50 MG/ML IJ SOLN
25.0000 mg | Freq: Once | INTRAMUSCULAR | Status: AC
  Administered 2019-05-24: 09:00:00 25 mg via INTRAVENOUS
  Filled 2019-05-24: qty 1

## 2019-05-24 MED ORDER — OXYCODONE-ACETAMINOPHEN 5-325 MG PO TABS: 1.0000 | ORAL_TABLET | ORAL | Status: DC | PRN

## 2019-05-24 MED ORDER — TRAMADOL HCL 50 MG PO TABS
50.0000 mg | ORAL_TABLET | Freq: Four times a day (QID) | ORAL | Status: DC | PRN
  Administered 2019-05-24 – 2019-05-27 (×6): 50 mg via ORAL
  Filled 2019-05-24 (×6): qty 1

## 2019-05-24 MED ORDER — ENOXAPARIN SODIUM 40 MG/0.4ML ~~LOC~~ SOLN
40.0000 mg | SUBCUTANEOUS | Status: DC
  Administered 2019-05-24 – 2019-05-26 (×3): 40 mg via SUBCUTANEOUS
  Filled 2019-05-24 (×3): qty 0.4

## 2019-05-24 MED ORDER — HYDRALAZINE HCL 20 MG/ML IJ SOLN
5.0000 mg | INTRAMUSCULAR | Status: DC | PRN
  Administered 2019-05-24: 17:00:00 5 mg via INTRAVENOUS
  Filled 2019-05-24: qty 1

## 2019-05-24 MED ORDER — PANTOPRAZOLE SODIUM 40 MG PO TBEC
40.0000 mg | DELAYED_RELEASE_TABLET | Freq: Every day | ORAL | Status: DC
  Administered 2019-05-24 – 2019-05-27 (×4): 40 mg via ORAL
  Filled 2019-05-24 (×4): qty 1

## 2019-05-24 MED ORDER — SERTRALINE HCL 50 MG PO TABS
150.0000 mg | ORAL_TABLET | Freq: Every day | ORAL | Status: DC
  Administered 2019-05-24 – 2019-05-26 (×3): 150 mg via ORAL
  Filled 2019-05-24 (×3): qty 3

## 2019-05-24 MED ORDER — CLONIDINE HCL 0.1 MG PO TABS
0.1000 mg | ORAL_TABLET | Freq: Once | ORAL | Status: AC
  Administered 2019-05-24: 11:00:00 0.1 mg via ORAL
  Filled 2019-05-24: qty 1

## 2019-05-24 MED ORDER — LORAZEPAM 2 MG/ML IJ SOLN
0.5000 mg | Freq: Once | INTRAMUSCULAR | Status: AC
  Administered 2019-05-24: 11:00:00 0.5 mg via INTRAVENOUS
  Filled 2019-05-24: qty 1

## 2019-05-24 MED ORDER — ACETAMINOPHEN 325 MG PO TABS: 650.0000 mg | ORAL_TABLET | Freq: Four times a day (QID) | ORAL | Status: DC | PRN

## 2019-05-24 NOTE — ED Notes (Signed)
Says dr Wynetta Emery just increased her lisinopirl last week.

## 2019-05-24 NOTE — ED Notes (Signed)
Attempted to call report, was told by Charge RN that pt was possibly getting assigned to another floor

## 2019-05-24 NOTE — ED Notes (Signed)
Admitting MD at bedside.

## 2019-05-24 NOTE — Progress Notes (Addendum)
Ch visited upon OR for an AD. Ch appeared calm, and when Ch asked if she had requested for an AD, Pt said that she wanted to read over the AD. Ch handed AD packet to Pt, and did AD education. Pt had questions about mom as visitor and Medical POA to come and help complete AD. [place] will check with Nurse about visitor policies. Ch spoke with Pt about current progress of care, and health. Pt reported a continual headache. Pt requested prayer for healing. [place] prayed with pt, and left.

## 2019-05-24 NOTE — ED Triage Notes (Signed)
Patient presents to the ED for a severe migraine x 3 days that is not improving with her normal medications as well as hypertension and dizziness today.  Patient states her diastolic pressure was over 100 this morning.  Patient is alert and oriented x 4.  Patient states her PCP instructed her to come to the ED.

## 2019-05-24 NOTE — ED Provider Notes (Signed)
[facility] Emergency Department Provider Note       Time seen: ----------------------------------------- 8:45 AM on 05/24/2019 -----------------------------------------   I have reviewed the triage vital signs and the nursing notes.  HISTORY   Chief Complaint Headache, Hypertension, and Dizziness   HPI Teresa [name] is a 49 y.o. female with a history of anemia, arthritis, asthma, bipolar disorder, depression, fibroids, GERD, migraines, hepatitis, HIV, hypertension, pancreatitis who presents to the ED for severe migraine for the last 3 days is not improving with normal medications.  Patient reports her diastolic blood pressure was over 100 this morning.  She arrives alert and oriented, pain is 7 out of 10.  Past Medical History:  Diagnosis Date  . Anemia    iron infusions x 2  . Anxiety   . Arthritis    Neck, left shoulder  . Asthma    uses inhaler 2-3 x week  . Bipolar disorder ([place])   . Depression   . Endometriosis   . Fibroids   . GERD (gastroesophageal reflux disease)   . Headache    Migraines  . Hepatitis 2009   Hep C  . HIV infection ([place])   . Hypertension   . Pancreatitis   . Seasonal allergies   . SVD (spontaneous vaginal delivery)    x 1    Patient Active Problem List   Diagnosis Date Noted  . Pelvic pain in female 05/16/2019  . Arthralgia 04/02/2019  . HTN, goal below 130/80 06/05/2018  . Iron deficiency anemia due to chronic blood loss 03/25/2014  . Abnormal uterine bleeding (AUB) 03/12/2014  . Bipolar affective disorder ([place]) 04/13/2012  . Endometriosis 04/13/2012  . Migraine headache 04/13/2012  . Hepatitis C infection 03/10/2011  . HIV disease ([place]) 03/10/2011    Past Surgical History:  Procedure Laterality Date  . ABDOMINAL HYSTERECTOMY    . CESAREAN SECTION     x2  . DILATION AND CURETTAGE OF UTERUS     x 2 - TAB  . HAND SURGERY Right    2 pins in wrist  . HYSTEROSCOPY N/A 10/11/2016   Procedure:  HYSTEROSCOPY WITH HYDROTHERMAL ABLATION;  Surgeon: Donnamae Jude, MD;  Location: [facility];  Service: Gynecology;  Laterality: N/A;  Caryl Pina the HTA rep will be here.  Confirmed on 09/02/16.   Marland Kitchen LAPAROSCOPIC BILATERAL SALPINGECTOMY Bilateral 07/11/2018   Procedure: LAPAROSCOPIC BILATERAL SALPINGECTOMY;  Surgeon: Ward, Honor Loh, MD;  Location: [facility];  Service: Gynecology;  Laterality: Bilateral;  . LAPAROSCOPIC HYSTERECTOMY N/A 07/11/2018   Procedure: HYSTERECTOMY TOTAL LAPAROSCOPIC;  Surgeon: Ward, Honor Loh, MD;  Location: [facility];  Service: Gynecology;  Laterality: N/A;  . LAPAROSCOPIC TUBAL LIGATION Bilateral 10/11/2016   Procedure: LAPAROSCOPIC TUBAL LIGATION WITH FILSHIE CLIPS;  Surgeon: Donnamae Jude, MD;  Location: [facility];  Service: Gynecology;  Laterality: Bilateral;  . MOUTH SURGERY    . ROBOTIC ASSISTED LAPAROSCOPIC OVARIAN CYSTECTOMY Left 05/04/2019   Procedure: XI ROBOTIC ASSISTED LAPAROSCOPIC OVARIAN CYSTECTOMY;  Surgeon: Ward, Honor Loh, MD;  Location: [facility];  Service: Gynecology;  Laterality: Left;  . UMBILICAL HERNIA REPAIR N/A 07/11/2018   Procedure: LAPAROSCOPIC UMBILICAL HERNIA REPAIR;  Surgeon: Benjamine Sprague, DO;  Location: [facility];  Service: General;  Laterality: N/A;  . WISDOM TOOTH EXTRACTION      Allergies Naproxen sodium  Social History Social History   Tobacco Use  . Smoking status: Former Smoker    Packs/day: 0.25    Years: 25.00    Pack years: 6.25  Types: Cigarettes  . Smokeless tobacco: Never Used  . Tobacco comment: uses vapor occasionally  Substance Use Topics  . Alcohol use: Yes    Comment: wine occ  . Drug use: Yes    Types: Marijuana    Comment: 2-3 x per month    Review of Systems Constitutional: Negative for fever. Cardiovascular: Negative for chest pain. Respiratory: Negative for shortness of breath. Gastrointestinal: Negative for abdominal pain, vomiting and diarrhea. Musculoskeletal: Negative for back pain. Skin: Negative for  rash. Neurological: Positive for headache, dizziness  All systems negative/normal/unremarkable except as stated in the HPI  ____________________________________________   PHYSICAL EXAM:  VITAL SIGNS: ED Triage Vitals  Enc Vitals Group     BP 05/24/19 0842 (!) 185/124     Pulse Rate 05/24/19 0842 99     Resp 05/24/19 0842 18     Temp 05/24/19 0842 98.1 F (36.7 C)     Temp Source 05/24/19 0842 Oral     SpO2 05/24/19 0842 100 %     Weight 05/24/19 0843 189 lb 9.5 oz (86 kg)     Height 05/24/19 0843 5\' 7"  (1.702 m)     Head Circumference --      Peak Flow --      Pain Score 05/24/19 0843 7     Pain Loc --      Pain Edu? --      Excl. in [place]? --    Constitutional: Alert and oriented. Well appearing and in no distress. Eyes: Conjunctivae are normal. Normal extraocular movements. Cardiovascular: Normal rate, regular rhythm. No murmurs, rubs, or gallops. Respiratory: Normal respiratory effort without tachypnea nor retractions. Breath sounds are clear and equal bilaterally. No wheezes/rales/rhonchi. Gastrointestinal: Soft and nontender. Normal bowel sounds Musculoskeletal: Nontender with normal range of motion in extremities. No lower extremity tenderness nor edema. Neurologic:  Normal speech and language. No gross focal neurologic deficits are appreciated.  Skin:  Skin is warm, dry and intact. No rash noted. Psychiatric: Mood and affect are normal. Speech and behavior are normal.  ____________________________________________  EKG: Interpreted by me.  Sinus rhythm with rate of 81 bpm, normal PR interval, LVH, normal QT  ____________________________________________  ED COURSE:  As part of my medical decision making, I reviewed the following data within the [facility] History obtained from family if available, nursing notes, old chart and ekg, as well as notes from prior ED visits. Patient presented for headache and hypertension, we will assess with labs and  imaging as indicated at this time.   Procedures  Teresa [name] was evaluated in Emergency Department on 05/24/2019 for the symptoms described in the history of present illness. She was evaluated in the context of the global COVID-19 pandemic, which necessitated consideration that the patient might be at risk for infection with the SARS-CoV-2 virus that causes COVID-19. Institutional protocols and algorithms that pertain to the evaluation of patients at risk for COVID-19 are in a state of rapid change based on information released by regulatory bodies including the CDC and federal and state organizations. These policies and algorithms were followed during the patient's care in the ED.  ____________________________________________   LABS (pertinent positives/negatives)  Labs Reviewed  CBC WITH DIFFERENTIAL/PLATELET - Abnormal; Notable for the following components:      Result Value   WBC 3.6 (*)    Neutro Abs 1.6 (*)    All other components within normal limits  COMPREHENSIVE METABOLIC PANEL - Abnormal; Notable for the following components:  Glucose, Bld 110 (*)    AST 74 (*)    ALT 63 (*)    Alkaline Phosphatase 144 (*)    All other components within normal limits  TROPONIN I (HIGH SENSITIVITY)   ____________________________________________   DIFFERENTIAL DIAGNOSIS   Migraine, tension headache, hypertensive urgency, subarachnoid hemorrhage, anxiety  FINAL ASSESSMENT AND PLAN  Migraine headache, hypertensive urgency   Plan: The patient had presented for persistent headache. Patient's labs did not reveal any acute process. Patient's imaging was reassuring.  Patient has had persistent severe hypertension and her headache is improved somewhat.  I did give her a dose of clonidine as well as IV headache medications.  I will discuss with the hospitalist for admission.   Laurence Aly, MD    Note: This note was generated in part or whole with voice recognition software. Voice  recognition is usually quite accurate but there are transcription errors that can and very often do occur. I apologize for any typographical errors that were not detected and corrected.     Earleen Newport, MD 05/24/19 1236

## 2019-05-24 NOTE — H&P (Signed)
History and Physical    Teresa [name] [id number] DOB: 05-17-1970 DOA: 05/24/2019  Referring MD/NP/PA:   PCP: Baxter Hire, MD   Patient coming from:  The patient is coming from home.  At baseline, pt is independent for most of ADL.        Chief Complaint: Headache and elevated blood pressure  HPI: Teresa [name] is a 49 y.o. female with medical history significant of hypertension, asthma, GERD, depression with anxiety, bipolar disorder, HIV (CD4 =640 on 03/14/2019), HCV, migraine headache, who presents with headache, elevated blood pressure.  Patient states that she has been having worsening headache in the past 2 days, which is located in the front head, bandlike feeling, 10 out of 10 in severity initially, currently 5-8 out of 10 severity, dull, nonradiating.  Patient does not have unilateral weakness, numbness or tingling in extremities, no facial droop or slurred speech.  Patient denies chest pain, shortness breath, cough, fever or chills.  No nausea, vomiting, diarrhea, abdominal pain, symptoms of UTI.  Patient states that because of elevated blood pressure, her lisinopril dose was increased from 10 mg to 20 mg daily last week by her doctor, but her blood pressure is still elevated.  Her blood pressure is 188/131 in ED.  ED Course: pt was found to have WBC 3.6, pending Covid PCR, electrolytes renal function okay, temperature normal, heart rate 86, oxygen saturation 99% on room air.  CT of head is negative for acute intracranial abnormalities. Pt is placed on MedSurg bed for observation.  Review of Systems:   General: no fevers, chills, no body weight gain, has fatigue HEENT: no blurry vision, hearing changes or sore throat Respiratory: no dyspnea, coughing, wheezing CV: no chest pain, no palpitations GI: no nausea, vomiting, abdominal pain, diarrhea, constipation GU: no dysuria, burning on urination, increased urinary frequency, hematuria  Ext: no leg edema Neuro: no  unilateral weakness, numbness, or tingling, no vision change or hearing loss. Has headache Skin: no rash, no skin tear. MSK: No muscle spasm, no deformity, no limitation of range of movement in spin Heme: No easy bruising.  Travel history: No recent long distant travel.  Allergy:  Allergies  Allergen Reactions  . Naproxen Sodium Hives    Can take ibuprofen without causing any hives    Past Medical History:  Diagnosis Date  . Anemia    iron infusions x 2  . Anxiety   . Arthritis    Neck, left shoulder  . Asthma    uses inhaler 2-3 x week  . Bipolar disorder ([place])   . Depression   . Endometriosis   . Fibroids   . GERD (gastroesophageal reflux disease)   . Headache    Migraines  . Hepatitis 2009   Hep C  . HIV infection ([place])   . Hypertension   . Pancreatitis   . Seasonal allergies   . SVD (spontaneous vaginal delivery)    x 1    Past Surgical History:  Procedure Laterality Date  . ABDOMINAL HYSTERECTOMY    . CESAREAN SECTION     x2  . DILATION AND CURETTAGE OF UTERUS     x 2 - TAB  . HAND SURGERY Right    2 pins in wrist  . HYSTEROSCOPY N/A 10/11/2016   Procedure: HYSTEROSCOPY WITH HYDROTHERMAL ABLATION;  Surgeon: Donnamae Jude, MD;  Location: [facility];  Service: Gynecology;  Laterality: N/A;  Caryl Pina the [employer]TA rep will be here.  Confirmed on 09/02/16.   Marland Kitchen  LAPAROSCOPIC BILATERAL SALPINGECTOMY Bilateral 07/11/2018   Procedure: LAPAROSCOPIC BILATERAL SALPINGECTOMY;  Surgeon: Ward, Honor Loh, MD;  Location: [facility];  Service: Gynecology;  Laterality: Bilateral;  . LAPAROSCOPIC HYSTERECTOMY N/A 07/11/2018   Procedure: HYSTERECTOMY TOTAL LAPAROSCOPIC;  Surgeon: Ward, Honor Loh, MD;  Location: [facility];  Service: Gynecology;  Laterality: N/A;  . LAPAROSCOPIC TUBAL LIGATION Bilateral 10/11/2016   Procedure: LAPAROSCOPIC TUBAL LIGATION WITH FILSHIE CLIPS;  Surgeon: Donnamae Jude, MD;  Location: [facility];  Service: Gynecology;  Laterality: Bilateral;  . MOUTH SURGERY    .  ROBOTIC ASSISTED LAPAROSCOPIC OVARIAN CYSTECTOMY Left 05/04/2019   Procedure: XI ROBOTIC ASSISTED LAPAROSCOPIC OVARIAN CYSTECTOMY;  Surgeon: Ward, Honor Loh, MD;  Location: [facility];  Service: Gynecology;  Laterality: Left;  . UMBILICAL HERNIA REPAIR N/A 07/11/2018   Procedure: LAPAROSCOPIC UMBILICAL HERNIA REPAIR;  Surgeon: Benjamine Sprague, DO;  Location: [facility];  Service: General;  Laterality: N/A;  . WISDOM TOOTH EXTRACTION      Social History:  reports that she has quit smoking. Her smoking use included cigarettes. She has a 6.25 pack-year smoking history. She has never used smokeless tobacco. She reports current alcohol use. She reports current drug use. Drug: Marijuana.  Family History:  Family History  Problem Relation Age of Onset  . Breast cancer Paternal Grandmother   . Breast cancer Cousin      Prior to Admission medications   Medication Sig Start Date End Date Taking? Authorizing Provider  abacavir-dolutegravir-lamiVUDine (TRIUMEQ) 600-50-300 MG tablet Take 1 tablet by mouth daily. 04/09/19   Tsosie Billing, MD  acetaminophen (TYLENOL) 325 MG tablet Take 2 tablets (650 mg total) by mouth every 8 (eight) hours as needed for mild pain. 05/04/19 06/03/19  Lysle Pearl, Isami, DO  albuterol (PROAIR HFA) 108 (90 Base) MCG/ACT inhaler Inhale 2 puffs into the lungs every 6 (six) hours as needed for wheezing or shortness of breath.     [provider]  buPROPion (WELLBUTRIN) 75 MG tablet Take 75 mg by mouth 2 (two) times daily.    [provider]  cetirizine (ZYRTEC) 10 MG tablet Take 10 mg by mouth daily as needed for allergies.     [provider]  diclofenac Sodium (VOLTAREN) 1 % GEL Apply 2 g topically daily as needed. 04/02/19   [provider]  fluconazole (DIFLUCAN) 100 MG tablet Take by mouth. 05/15/19 05/29/19  [provider]  fluticasone (FLONASE) 50 MCG/ACT nasal spray Place 1 spray into both nostrils daily as needed for allergies or  rhinitis.     [provider]  Galcanezumab-gnlm (EMGALITY) 120 MG/ML SOSY Inject 120 mg into the skin every 28 (twenty-eight) days.    [provider]  lisinopril (ZESTRIL) 10 MG tablet Take 10 mg by mouth daily.    [provider]  meloxicam (MOBIC) 7.5 MG tablet Take 7.5 mg by mouth daily.    [provider]  Multiple Vitamin (MULTIVITAMIN WITH MINERALS) TABS tablet Take 2 tablets by mouth daily. Vita fusion    [provider]  sertraline (ZOLOFT) 100 MG tablet Take 150 mg by mouth at bedtime.     [provider]  SUMAtriptan (IMITREX) 100 MG tablet Take by mouth. 05/03/19   [provider]    Physical Exam: Vitals:   05/24/19 1100 05/24/19 1105 05/24/19 1200 05/24/19 1300  BP: (!) 179/135 (!) 175/135 (!) 163/115 (!) 188/131  Pulse: 80  86 78  Resp: 18  20 19   Temp:      TempSrc:  SpO2: 100%  100% 100%  Weight:      Height:       General: Not in acute distress HEENT:       Eyes: PERRL, EOMI, no scleral icterus.       ENT: No discharge from the ears and nose, no pharynx injection, no tonsillar enlargement.        Neck: No JVD, no bruit, no mass felt. Heme: No neck lymph node enlargement. Cardiac: S1/S2, RRR, No murmurs, No gallops or rubs. Respiratory: No rales, wheezing, rhonchi or rubs. GI: Soft, nondistended, nontender, no rebound pain, no organomegaly, BS present. GU: No hematuria Ext: No pitting leg edema bilaterally. 2+DP/PT pulse bilaterally. Musculoskeletal: No joint deformities, No joint redness or warmth, no limitation of ROM in spin. Skin: No rashes.  Neuro: Alert, oriented X3, cranial nerves II-XII grossly intact, moves all extremities normally.  Psych: Patient is not psychotic, no suicidal or hemocidal ideation.  Labs on Admission: I have personally reviewed following labs and imaging studies  CBC: Recent Labs  Lab 05/24/19 0922  WBC 3.6*  NEUTROABS 1.6*  HGB 14.1  HCT 42.0  MCV 89.2  PLT  0000000   Basic Metabolic Panel: Recent Labs  Lab 05/24/19 0922  NA 138  K 3.9  CL 103  CO2 27  GLUCOSE 110*  BUN 9  CREATININE 0.66  CALCIUM 8.9   GFR: Estimated Creatinine Clearance: 99 mL/min (by C-G formula based on SCr of 0.66 mg/dL). Liver Function Tests: Recent Labs  Lab 05/24/19 0922  AST 74*  ALT 63*  ALKPHOS 144*  BILITOT 0.7  PROT 7.7  ALBUMIN 3.8   No results for input(s): LIPASE, AMYLASE in the last 168 hours. No results for input(s): AMMONIA in the last 168 hours. Coagulation Profile: No results for input(s): INR, PROTIME in the last 168 hours. Cardiac Enzymes: No results for input(s): CKTOTAL, CKMB, CKMBINDEX, TROPONINI in the last 168 hours. BNP (last 3 results) No results for input(s): PROBNP in the last 8760 hours. HbA1C: No results for input(s): HGBA1C in the last 72 hours. CBG: No results for input(s): GLUCAP in the last 168 hours. Lipid Profile: No results for input(s): CHOL, HDL, LDLCALC, TRIG, CHOLHDL, LDLDIRECT in the last 72 hours. Thyroid Function Tests: No results for input(s): TSH, T4TOTAL, FREET4, T3FREE, THYROIDAB in the last 72 hours. Anemia Panel: No results for input(s): VITAMINB12, FOLATE, FERRITIN, TIBC, IRON, RETICCTPCT in the last 72 hours. Urine analysis:    Component Value Date/Time   COLORURINE AMBER (A) 04/27/2019 0600   APPEARANCEUR CLEAR (A) 04/27/2019 0600   LABSPEC 1.010 04/27/2019 0600   PHURINE 5.0 04/27/2019 0600   GLUCOSEU NEGATIVE 04/27/2019 0600   HGBUR NEGATIVE 04/27/2019 0600   BILIRUBINUR NEGATIVE 04/27/2019 0600   KETONESUR NEGATIVE 04/27/2019 0600   PROTEINUR NEGATIVE 04/27/2019 0600   NITRITE POSITIVE (A) 04/27/2019 0600   LEUKOCYTESUR NEGATIVE 04/27/2019 0600   Sepsis Labs: @LABRCNTIP (procalcitonin:4,lacticidven:4) )No results found for this or any previous visit (from the past 240 hour(s)).   Radiological Exams on Admission: CT Head Wo Contrast  Result Date: 05/24/2019 CLINICAL DATA:  Headache  and hypertension. Suspected subarachnoid hemorrhage. EXAM: CT HEAD WITHOUT CONTRAST TECHNIQUE: Contiguous axial images were obtained from the base of the skull through the vertex without intravenous contrast. COMPARISON:  None. FINDINGS: Brain: The brain shows a normal appearance without evidence of malformation, atrophy, old or acute small or large vessel infarction, mass lesion, hemorrhage, hydrocephalus or extra-axial collection. Vascular: No hyperdense vessel. No evidence of atherosclerotic calcification.  Skull: Normal.  No traumatic finding.  No focal bone lesion. Sinuses/Orbits: Sinuses are clear. Orbits appear normal. Mastoids are clear. Other: None significant IMPRESSION: Normal head CT Electronically Signed   By: Nelson Chimes M.D.   On: 05/24/2019 11:23     EKG: Independently reviewed.  Sinus rhythm, QTC 481, LAE, no obvious ischemic change.  Assessment/Plan Principal Problem:   Hypertensive urgency Active Problems:   Hepatitis C infection   HIV disease (HCC)   Migraine headache   HTN, goal below 130/80   Asthma   Depression with anxiety  Hypertensive urgency and hx of HTN: CT-head negative. No CP or SOB. No focal neurologic findings on physical examination.  Patient is currently taking lisinopril 20 mg daily.  Patient was given 0.1 mg of clonidine in ED.  Blood pressure still elevated.  -place on med-surg bed for obs -continue home lisinopril 20 mg daily -Start amlodipine 10 mg daily -prn IV hydralazine -Frequent neuro check    Hepatitis C infection: not treated in the past. LFT with ALP 144, AST 74, ALT 64, total bilirubin 0.7. -Need to follow-up with ID  HIV disease ([place]): CD4 =640 on 03/14/2019. -continue home meds  Migraine headache: Patient has has worsening headache, may be due to elevated blood pressure.  Patient is well.  Reglan and Benadryl in ED -Blood pressure control -As needed sumatriptan  Asthma: stable. -prn albuterol   Depression with anxiety: no SI or  HI. -continue home meds.    DVT ppx: SQ Lovenox Code Status: Full code Family Communication: None at bed side.   Disposition Plan:  Anticipate discharge back to previous home environment Consults called:  none Admission status: Med-surg bed for obs   Date of Service 05/24/2019    [facility]   If 7PM-7AM, please contact night-coverage [email or web address] Password [email or web address] 05/24/2019, 1:48 PM

## 2019-05-24 NOTE — ED Notes (Signed)
Pt to CT at this time.

## 2019-05-24 NOTE — Progress Notes (Signed)
Talked to Dr. Blaine Hamper about patient's BP of 173/115, asked if we can change parameter for IV hydralazine. Also patient is still complaining of HA, tramadol PRN given for pain. RN will continue to monitor.

## 2019-05-24 NOTE — ED Notes (Signed)
Pt given sandwich tray as requested. Call bell within reach

## 2019-05-25 ENCOUNTER — Ambulatory Visit: Admission: RE | Admit: 2019-05-25 | Payer: Medicaid Other | Source: Ambulatory Visit

## 2019-05-25 DIAGNOSIS — G43909 Migraine, unspecified, not intractable, without status migrainosus: Secondary | ICD-10-CM | POA: Diagnosis present

## 2019-05-25 DIAGNOSIS — F319 Bipolar disorder, unspecified: Secondary | ICD-10-CM | POA: Diagnosis present

## 2019-05-25 DIAGNOSIS — I10 Essential (primary) hypertension: Secondary | ICD-10-CM | POA: Diagnosis present

## 2019-05-25 DIAGNOSIS — R519 Headache, unspecified: Secondary | ICD-10-CM | POA: Diagnosis not present

## 2019-05-25 DIAGNOSIS — K219 Gastro-esophageal reflux disease without esophagitis: Secondary | ICD-10-CM | POA: Diagnosis present

## 2019-05-25 DIAGNOSIS — B1920 Unspecified viral hepatitis C without hepatic coma: Secondary | ICD-10-CM | POA: Diagnosis present

## 2019-05-25 DIAGNOSIS — B20 Human immunodeficiency virus [HIV] disease: Secondary | ICD-10-CM | POA: Diagnosis present

## 2019-05-25 DIAGNOSIS — Z20822 Contact with and (suspected) exposure to covid-19: Secondary | ICD-10-CM | POA: Diagnosis present

## 2019-05-25 DIAGNOSIS — Z791 Long term (current) use of non-steroidal anti-inflammatories (NSAID): Secondary | ICD-10-CM | POA: Diagnosis not present

## 2019-05-25 DIAGNOSIS — Z886 Allergy status to analgesic agent status: Secondary | ICD-10-CM | POA: Diagnosis not present

## 2019-05-25 DIAGNOSIS — M1990 Unspecified osteoarthritis, unspecified site: Secondary | ICD-10-CM | POA: Diagnosis present

## 2019-05-25 DIAGNOSIS — I160 Hypertensive urgency: Secondary | ICD-10-CM | POA: Diagnosis not present

## 2019-05-25 DIAGNOSIS — Z803 Family history of malignant neoplasm of breast: Secondary | ICD-10-CM | POA: Diagnosis not present

## 2019-05-25 DIAGNOSIS — J45909 Unspecified asthma, uncomplicated: Secondary | ICD-10-CM | POA: Diagnosis present

## 2019-05-25 DIAGNOSIS — F418 Other specified anxiety disorders: Secondary | ICD-10-CM | POA: Diagnosis present

## 2019-05-25 DIAGNOSIS — Z79899 Other long term (current) drug therapy: Secondary | ICD-10-CM | POA: Diagnosis not present

## 2019-05-25 DIAGNOSIS — Z87891 Personal history of nicotine dependence: Secondary | ICD-10-CM | POA: Diagnosis not present

## 2019-05-25 LAB — BASIC METABOLIC PANEL
Anion gap: 9 (ref 5–15)
BUN: 10 mg/dL (ref 6–20)
CO2: 26 mmol/L (ref 22–32)
Calcium: 8.7 mg/dL — ABNORMAL LOW (ref 8.9–10.3)
Chloride: 101 mmol/L (ref 98–111)
Creatinine, Ser: 0.64 mg/dL (ref 0.44–1.00)
GFR calc Af Amer: >60 mL/min (ref >60)
GFR calc non Af Amer: >60 mL/min (ref >60)
Glucose, Bld: 107 mg/dL — ABNORMAL HIGH (ref 70–99)
Potassium: 3.7 mmol/L (ref 3.5–5.1)
Sodium: 136 mmol/L (ref 135–145)

## 2019-05-25 LAB — CBC
HCT: 43.1 % (ref 36.0–46.0)
Hemoglobin: 14.7 g/dL (ref 12.0–15.0)
MCH: 30.2 pg (ref 26.0–34.0)
MCHC: 34.1 g/dL (ref 30.0–36.0)
MCV: 88.5 fL (ref 80.0–100.0)
Platelets: 177 10*3/uL (ref 150–400)
RBC: 4.87 MIL/uL (ref 3.87–5.11)
RDW: 12.9 % (ref 11.5–15.5)
WBC: 3.5 10*3/uL — ABNORMAL LOW (ref 4.0–10.5)
nRBC: 0 % (ref 0.0–0.2)

## 2019-05-25 MED ORDER — LISINOPRIL 20 MG PO TABS
20.0000 mg | ORAL_TABLET | Freq: Once | ORAL | Status: AC
  Administered 2019-05-25: 20 mg via ORAL
  Filled 2019-05-25: qty 1

## 2019-05-25 MED ORDER — SODIUM CHLORIDE 0.9% FLUSH
10.0000 mL | Freq: Two times a day (BID) | INTRAVENOUS | Status: DC
  Administered 2019-05-25 – 2019-05-26 (×3): 10 mL via INTRAVENOUS

## 2019-05-25 MED ORDER — LISINOPRIL 20 MG PO TABS
40.0000 mg | ORAL_TABLET | Freq: Every day | ORAL | Status: DC
  Administered 2019-05-26 – 2019-05-27 (×2): 40 mg via ORAL
  Filled 2019-05-25 (×2): qty 2

## 2019-05-25 MED ORDER — BUTALBITAL-APAP-CAFFEINE 50-325-40 MG PO TABS
1.0000 | ORAL_TABLET | ORAL | Status: DC | PRN
  Administered 2019-05-25: 1 via ORAL
  Filled 2019-05-25: qty 1

## 2019-05-25 MED ORDER — HYDROCHLOROTHIAZIDE 25 MG PO TABS
25.0000 mg | ORAL_TABLET | Freq: Every day | ORAL | Status: DC
  Administered 2019-05-25 – 2019-05-27 (×3): 25 mg via ORAL
  Filled 2019-05-25 (×3): qty 1

## 2019-05-25 NOTE — Telephone Encounter (Signed)
Inform needs to ne done as outpatient cant be done as in patient

## 2019-05-25 NOTE — Telephone Encounter (Signed)
MRI is canceled

## 2019-05-25 NOTE — Discharge Summary (Signed)
Physician Discharge Summary  Teresa [name] [id number] DOB: May 05, 1970 DOA: 05/24/2019  PCP: Baxter Hire, MD  Admit date: 05/24/2019 Discharge date: 05/27/2019  Admitted From: Home Disposition: Home  Recommendations for Outpatient Follow-up:  1. Follow up with PCP in 1-2 weeks 2.   Home Health: No Equipment/Devices: None Discharge Condition: Stable CODE STATUS: Full Diet recommendation: Heart Healthy  Brief/Interim Summary: Teresa [name] is a 49 y.o. female with medical history significant of hypertension, asthma, GERD, depression with anxiety, bipolar disorder, HIV (CD4 =640 on 03/14/2019), HCV, migraine headache, who presents with headache, elevated blood pressure.  Patient states that she has been having worsening headache in the past 2 days, which is located in the front head, bandlike feeling, 10 out of 10 in severity initially, currently 5-8 out of 10 severity, dull, nonradiating.  Patient does not have unilateral weakness, numbness or tingling in extremities, no facial droop or slurred speech.  Patient denies chest pain, shortness breath, cough, fever or chills.  No nausea, vomiting, diarrhea, abdominal pain, symptoms of UTI.  Patient states that because of elevated blood pressure, her lisinopril dose was increased from 10 mg to 20 mg daily last week by her doctor, but her blood pressure is still elevated.  Her blood pressure is 188/131 in ED.  1/29: Blood pressure improved.  Still with persistent migraine.  Unable to discharge due to persistent symptoms  1/30: Blood pressure remains elevated despite escalation in therapy.  Migraines improved though still present.  1/31: Blood pressure control improved after 4 agents initiated.  Migraines improved though still present.  Patient educated on options regarding migraine prophylaxis.  Medically stable for discharge home at this time   Discharge Diagnoses:  Principal Problem:   Hypertensive urgency Active Problems:    Hepatitis C infection   HIV disease ([place])   Migraine headache   HTN, goal below 130/80   Asthma   Depression with anxiety  Hypertensive urgency and hx of HTN:  CT-head negative. No CP or SOB.  No focal neurologic findings on physical examination.   Patient is currently taking lisinopril 20 mg daily.   Patient was given 0.1 mg of clonidine in ED.   Discharge recommendations: Verapamil 80 mg 3 times daily Metoprolol tartrate 25 mg twice daily Hydrochlorothiazide 25 mg daily Lisinopril 40 mg daily  Up outpatient with PCP  Hepatitis C infection:  not treated in the past.  LFT with ALP 144, AST 74, ALT 64, total bilirubin 0.7. -Need to follow-up with ID  HIV disease ([place]):  CD4 =640 on 03/14/2019. -continue home meds  Migraine headache:  Patient has has worsening headache, may be due to elevated blood pressure.   Patient is well.  Reglan and Benadryl in ED -Blood pressure control -As needed sumatriptan -As needed Fioricet -Verapamil will have some prophylactic effect migraines -Riboflavin also recommended the patient bedside as migraine prophylaxis  Asthma: stable. -prn albuterol   Depression with anxiety: no SI or HI. -continue home meds.  Discharge Instructions     Allergies  Allergen Reactions  . Naproxen Sodium Hives    Can take ibuprofen without causing any hives    Consultations:  None   Procedures/Studies: CT Head Wo Contrast  Result Date: 05/24/2019 CLINICAL DATA:  Headache and hypertension. Suspected subarachnoid hemorrhage. EXAM: CT HEAD WITHOUT CONTRAST TECHNIQUE: Contiguous axial images were obtained from the base of the skull through the vertex without intravenous contrast. COMPARISON:  None. FINDINGS: Brain: The brain shows a normal appearance without evidence of malformation,  atrophy, old or acute small or large vessel infarction, mass lesion, hemorrhage, hydrocephalus or extra-axial collection. Vascular: No hyperdense vessel. No evidence  of atherosclerotic calcification. Skull: Normal.  No traumatic finding.  No focal bone lesion. Sinuses/Orbits: Sinuses are clear. Orbits appear normal. Mastoids are clear. Other: None significant IMPRESSION: Normal head CT Electronically Signed   By: Nelson Chimes M.D.   On: 05/24/2019 11:23   US Transvaginal Non-OB  Result Date: 05/06/2019 CLINICAL DATA:  Right lower quadrant pain. History of left ovarian cystectomy on 05/04/2019 and hysterectomy in 2020. EXAM: TRANSABDOMINAL AND TRANSVAGINAL ULTRASOUND OF PELVIS DOPPLER ULTRASOUND OF OVARIES TECHNIQUE: Both transabdominal and transvaginal ultrasound examinations of the pelvis were performed. Transabdominal technique was performed for global imaging of the pelvis including uterus, ovaries, adnexal regions, and pelvic cul-de-sac. It was necessary to proceed with endovaginal exam following the transabdominal exam to visualize the right ovary. Color and duplex Doppler ultrasound was utilized to evaluate blood flow to the ovaries. COMPARISON:  04/27/2019 pelvic ultrasound. 05/06/2019 CT abdomen and pelvis. FINDINGS: Uterus Surgically absent. Ovaries A single ovary was visualized in the midline of the pelvis measuring 6.3 x 3.3 x 3.1 cm (volume of 34 mL). While this was labeled as the right ovary by the sonographer and described as the right ovary on today's earlier CT, given the history of a left ovarian cystectomy (not an oophorectomy), lack of visualization of the other ovary today, and lack of visualization of the right ovary on the prior ultrasound, it is possible that this instead reflects the left ovary which is positioned differently than on the previous ultrasound following interval surgery. A 2.7 x 2.3 x 2.2 cm complex cyst in the ovary with reticular internal echoes is compatible with hemorrhage. There are a few additional smaller cysts or follicles in the ovary as well, some of which may also contain a small amount of hemorrhage or other debris. Pulsed  Doppler evaluation of this ovary demonstrates normal low-resistance arterial and venous waveforms. Other findings Small volume complex fluid in the pelvis bilaterally including about the single visualized ovary. IMPRESSION: 1. Visualization of a single ovary in the midline of the pelvis which contains multiple small cysts including a 2.7 cm hemorrhagic cyst. No evidence of ovarian torsion. 2. Small volume complex fluid in the pelvis which may reflect hemorrhage. Electronically Signed   By: Logan Bores M.D.   On: 05/06/2019 11:31   US Transvaginal Non-OB  Result Date: 04/27/2019 CLINICAL DATA:  Pelvic pain EXAM: TRANSABDOMINAL AND TRANSVAGINAL ULTRASOUND OF PELVIS DOPPLER ULTRASOUND OF OVARIES TECHNIQUE: Both transabdominal and transvaginal ultrasound examinations of the pelvis were performed. Transabdominal technique was performed for global imaging of the pelvis including uterus, ovaries, adnexal regions, and pelvic cul-de-sac. It was necessary to proceed with endovaginal exam following the transabdominal exam to visualize the ovaries. Color and duplex Doppler ultrasound was utilized to evaluate blood flow to the ovaries. COMPARISON:  Recent CT of the abdomen and pelvis. FINDINGS: Uterus Surgically removed Right ovary Not well visualized Left ovary Measurements: 7.4 x 4.9 x 5.3 cm. = volume: 99 mL. Multiple cystic lesions are noted within the left ovary similar to that seen on the prior CT examination. Some internal debris is identified. Pulsed Doppler evaluation of the left ovary demonstrates normal low-resistance arterial and venous waveforms. Other findings Mild free fluid is noted. Some debris is noted within the free fluid in this could be related to hemorrhage. IMPRESSION: Multi cystic lesion within the left ovary with internal debris likely representing hemorrhage.  Possible cystic ovarian neoplasm deserves consideration although no definitive malignant features are seen. Follow-up examination is  recommended by means of ultrasound in 3 months to assess for stability/regression. Electronically Signed   By: Inez Catalina M.D.   On: 04/27/2019 11:55   US Pelvis Complete  Result Date: 05/06/2019 CLINICAL DATA:  Right lower quadrant pain. History of left ovarian cystectomy on 05/04/2019 and hysterectomy in 2020. EXAM: TRANSABDOMINAL AND TRANSVAGINAL ULTRASOUND OF PELVIS DOPPLER ULTRASOUND OF OVARIES TECHNIQUE: Both transabdominal and transvaginal ultrasound examinations of the pelvis were performed. Transabdominal technique was performed for global imaging of the pelvis including uterus, ovaries, adnexal regions, and pelvic cul-de-sac. It was necessary to proceed with endovaginal exam following the transabdominal exam to visualize the right ovary. Color and duplex Doppler ultrasound was utilized to evaluate blood flow to the ovaries. COMPARISON:  04/27/2019 pelvic ultrasound. 05/06/2019 CT abdomen and pelvis. FINDINGS: Uterus Surgically absent. Ovaries A single ovary was visualized in the midline of the pelvis measuring 6.3 x 3.3 x 3.1 cm (volume of 34 mL). While this was labeled as the right ovary by the sonographer and described as the right ovary on today's earlier CT, given the history of a left ovarian cystectomy (not an oophorectomy), lack of visualization of the other ovary today, and lack of visualization of the right ovary on the prior ultrasound, it is possible that this instead reflects the left ovary which is positioned differently than on the previous ultrasound following interval surgery. A 2.7 x 2.3 x 2.2 cm complex cyst in the ovary with reticular internal echoes is compatible with hemorrhage. There are a few additional smaller cysts or follicles in the ovary as well, some of which may also contain a small amount of hemorrhage or other debris. Pulsed Doppler evaluation of this ovary demonstrates normal low-resistance arterial and venous waveforms. Other findings Small volume complex fluid in  the pelvis bilaterally including about the single visualized ovary. IMPRESSION: 1. Visualization of a single ovary in the midline of the pelvis which contains multiple small cysts including a 2.7 cm hemorrhagic cyst. No evidence of ovarian torsion. 2. Small volume complex fluid in the pelvis which may reflect hemorrhage. Electronically Signed   By: Logan Bores M.D.   On: 05/06/2019 11:31   US Pelvis Complete  Result Date: 04/27/2019 CLINICAL DATA:  Pelvic pain EXAM: TRANSABDOMINAL AND TRANSVAGINAL ULTRASOUND OF PELVIS DOPPLER ULTRASOUND OF OVARIES TECHNIQUE: Both transabdominal and transvaginal ultrasound examinations of the pelvis were performed. Transabdominal technique was performed for global imaging of the pelvis including uterus, ovaries, adnexal regions, and pelvic cul-de-sac. It was necessary to proceed with endovaginal exam following the transabdominal exam to visualize the ovaries. Color and duplex Doppler ultrasound was utilized to evaluate blood flow to the ovaries. COMPARISON:  Recent CT of the abdomen and pelvis. FINDINGS: Uterus Surgically removed Right ovary Not well visualized Left ovary Measurements: 7.4 x 4.9 x 5.3 cm. = volume: 99 mL. Multiple cystic lesions are noted within the left ovary similar to that seen on the prior CT examination. Some internal debris is identified. Pulsed Doppler evaluation of the left ovary demonstrates normal low-resistance arterial and venous waveforms. Other findings Mild free fluid is noted. Some debris is noted within the free fluid in this could be related to hemorrhage. IMPRESSION: Multi cystic lesion within the left ovary with internal debris likely representing hemorrhage. Possible cystic ovarian neoplasm deserves consideration although no definitive malignant features are seen. Follow-up examination is recommended by means of ultrasound in 3 months to  assess for stability/regression. Electronically Signed   By: Inez Catalina M.D.   On: 04/27/2019 11:55    CT ABDOMEN PELVIS W CONTRAST  Result Date: 05/06/2019 CLINICAL DATA:  Abdominal pain. Recent surgical removal of ovarian cyst EXAM: CT ABDOMEN AND PELVIS WITH CONTRAST TECHNIQUE: Multidetector CT imaging of the abdomen and pelvis was performed using the standard protocol following bolus administration of intravenous contrast. CONTRAST:  173mL OMNIPAQUE IOHEXOL 300 MG/ML  SOLN COMPARISON:  April 27, 2019 CT abdomen and pelvis and pelvic ultrasound FINDINGS: Lower chest: There is bibasilar atelectatic change. Hepatobiliary: There is hepatic steatosis. No focal liver lesions are appreciable. Gallbladder wall is not appreciably thickened. There is no biliary duct dilatation. Pancreas: There is no pancreatic mass or inflammatory focus. Pancreatic duct is upper normal in size without obstructing lesion. Pancreatic enhancement normal. Spleen: No splenic lesions are evident. Adrenals/Urinary Tract: Adrenals bilaterally appear normal. There is a 6 mm cyst in the lateral upper pole left kidney. There is an apparent angiomyolipoma arising from the lower pole the right kidney measuring 1.8 x 1.1 cm. There is no evident hydronephrosis on either side. There is no renal or ureteral calculus on either side. Urinary bladder is midline with wall thickness within normal limits. Urinary bladder currently is distended. Stomach/Bowel: There is no appreciable bowel wall or mesenteric thickening. There is moderate stool in the colon. There is no evident bowel obstruction. The terminal ileum appears unremarkable. There is no demonstrable free air or portal venous air. Vascular/Lymphatic: There is no abdominal aortic aneurysm. There are foci of abdominal aortic atherosclerotic calcification. Major venous structures appear patent. No evident adenopathy in the abdomen or pelvis. Reproductive: Uterus is absent. Left ovary not seen. There are apparent cystic areas in the right ovary, largest measuring 1.7 cm, which may represent  follicles. There is partially loculated fluid in the dependent portion the pelvis. Clip noted in left pelvis, stable. Other: Appendix appears normal. No abscess evident in the abdomen or pelvis. There is no ascites beyond apparent partially loculated fluid in the cul-de-sac. There is postoperative change involving the periumbilical region is well as along the lateral right abdominal wall consistent with recent surgery. No fluid collections are evident in these areas of recent surgery. A small amount of air is noted in the right lateral upper pelvis consistent with recent surgery. Musculoskeletal: No blastic or lytic bone lesions. No intramuscular lesions are evident. IMPRESSION: 1. Apparent postoperative change in the pelvis. Left ovary currently not appreciable. Cystic areas in the right ovary could represent dominant follicles. There is adjacent partially loculated fluid in the cul-de-sac. Uterus absent. Given acute onset pain and the appearance of the right ovary, correlation with pelvic ultrasound including Doppler assessment to assess for possible torsion advisable. 2. Postoperative changes noted in the right upper pelvic wall and periumbilical regions. No findings suggesting abscess in these areas. 3. No bowel obstruction. No abscess in the abdomen or pelvis. Appendix region appears normal. 4. No renal or ureteral calculus. No hydronephrosis. Urinary bladder is distended with wall thickness within normal limits. 5.  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 6.  Hepatic steatosis. 7.  Aortic Atherosclerosis (ICD10-I70.0). Electronically Signed   By: Lowella Grip III M.D.   On: 05/06/2019 09:36   CT ABDOMEN PELVIS W CONTRAST  Result Date: 04/27/2019 CLINICAL DATA:  Acute right lower quadrant abdominal pain. EXAM: CT ABDOMEN AND PELVIS WITH CONTRAST TECHNIQUE: Multidetector CT imaging of the abdomen and pelvis was performed using the standard protocol  following bolus administration  of intravenous contrast. CONTRAST:  159mL OMNIPAQUE IOHEXOL 300 MG/ML  SOLN COMPARISON:  July 14, 2018. FINDINGS: Lower chest: No acute abnormality. Hepatobiliary: No focal liver abnormality is seen. No gallstones, gallbladder wall thickening, or biliary dilatation. Pancreas: Unremarkable. No pancreatic ductal dilatation or surrounding inflammatory changes. Spleen: Normal in size without focal abnormality. Adrenals/Urinary Tract: Adrenal glands are unremarkable. Kidneys are normal, without renal calculi, focal lesion, or hydronephrosis. Bladder is unremarkable. Stomach/Bowel: Stomach is within normal limits. Appendix appears normal. No evidence of bowel wall thickening, distention, or inflammatory changes. Vascular/Lymphatic: No significant vascular findings are present. No enlarged abdominal or pelvic lymph nodes. Reproductive: Status post hysterectomy. No right adnexal abnormality is noted. There is interval development of left adnexal mass measuring 7.3 x 4.5 cm with multiple rounded cystic areas. This may represent cystic ovarian neoplasm, and further evaluation with ultrasound is recommended. Other: Mild amount of free fluid is noted in the pelvis. Moderate size fat containing periumbilical hernia is noted. Musculoskeletal: No acute or significant osseous findings. IMPRESSION: 1. Moderate size fat containing periumbilical hernia. 2. Interval development of 7.3 x 4.5 cm left adnexal mass with multiple rounded cystic areas. This may represent cystic ovarian neoplasm, and further evaluation with ultrasound is recommended. 3. Mild amount of free fluid is noted in the pelvis. Electronically Signed   By: Marijo Conception M.D.   On: 04/27/2019 10:25   US PELVIC DOPPLER (TORSION R/O OR MASS ARTERIAL FLOW)  Result Date: 05/06/2019 CLINICAL DATA:  Right lower quadrant pain. History of left ovarian cystectomy on 05/04/2019 and hysterectomy in 2020. EXAM: TRANSABDOMINAL AND TRANSVAGINAL ULTRASOUND OF PELVIS DOPPLER  ULTRASOUND OF OVARIES TECHNIQUE: Both transabdominal and transvaginal ultrasound examinations of the pelvis were performed. Transabdominal technique was performed for global imaging of the pelvis including uterus, ovaries, adnexal regions, and pelvic cul-de-sac. It was necessary to proceed with endovaginal exam following the transabdominal exam to visualize the right ovary. Color and duplex Doppler ultrasound was utilized to evaluate blood flow to the ovaries. COMPARISON:  04/27/2019 pelvic ultrasound. 05/06/2019 CT abdomen and pelvis. FINDINGS: Uterus Surgically absent. Ovaries A single ovary was visualized in the midline of the pelvis measuring 6.3 x 3.3 x 3.1 cm (volume of 34 mL). While this was labeled as the right ovary by the sonographer and described as the right ovary on today's earlier CT, given the history of a left ovarian cystectomy (not an oophorectomy), lack of visualization of the other ovary today, and lack of visualization of the right ovary on the prior ultrasound, it is possible that this instead reflects the left ovary which is positioned differently than on the previous ultrasound following interval surgery. A 2.7 x 2.3 x 2.2 cm complex cyst in the ovary with reticular internal echoes is compatible with hemorrhage. There are a few additional smaller cysts or follicles in the ovary as well, some of which may also contain a small amount of hemorrhage or other debris. Pulsed Doppler evaluation of this ovary demonstrates normal low-resistance arterial and venous waveforms. Other findings Small volume complex fluid in the pelvis bilaterally including about the single visualized ovary. IMPRESSION: 1. Visualization of a single ovary in the midline of the pelvis which contains multiple small cysts including a 2.7 cm hemorrhagic cyst. No evidence of ovarian torsion. 2. Small volume complex fluid in the pelvis which may reflect hemorrhage. Electronically Signed   By: Logan Bores M.D.   On: 05/06/2019  11:31   [place] Art/Ven Flow Abd Pelv Doppler  Result Date: 04/27/2019 CLINICAL DATA:  Pelvic pain EXAM: TRANSABDOMINAL AND TRANSVAGINAL ULTRASOUND OF PELVIS DOPPLER ULTRASOUND OF OVARIES TECHNIQUE: Both transabdominal and transvaginal ultrasound examinations of the pelvis were performed. Transabdominal technique was performed for global imaging of the pelvis including uterus, ovaries, adnexal regions, and pelvic cul-de-sac. It was necessary to proceed with endovaginal exam following the transabdominal exam to visualize the ovaries. Color and duplex Doppler ultrasound was utilized to evaluate blood flow to the ovaries. COMPARISON:  Recent CT of the abdomen and pelvis. FINDINGS: Uterus Surgically removed Right ovary Not well visualized Left ovary Measurements: 7.4 x 4.9 x 5.3 cm. = volume: 99 mL. Multiple cystic lesions are noted within the left ovary similar to that seen on the prior CT examination. Some internal debris is identified. Pulsed Doppler evaluation of the left ovary demonstrates normal low-resistance arterial and venous waveforms. Other findings Mild free fluid is noted. Some debris is noted within the free fluid in this could be related to hemorrhage. IMPRESSION: Multi cystic lesion within the left ovary with internal debris likely representing hemorrhage. Possible cystic ovarian neoplasm deserves consideration although no definitive malignant features are seen. Follow-up examination is recommended by means of ultrasound in 3 months to assess for stability/regression. Electronically Signed   By: Inez Catalina M.D.   On: 04/27/2019 11:55    (Echo, Carotid, EGD, Colonoscopy, ERCP)    Subjective: Seen and examined on the day of discharge Complains of headache otherwise stable Medically ready for discharge home  Discharge Exam: Vitals:   05/25/19 0358 05/25/19 0806  BP: (!) 149/106 (!) 152/108  Pulse: 90 80  Resp:  18  Temp:  98 F (36.7 C)  SpO2: 100% 100%   Vitals:   05/24/19 1937  05/25/19 0356 05/25/19 0358 05/25/19 0806  BP: (!) 144/96 (!) 158/104 (!) 149/106 (!) 152/108  Pulse: 99 91 90 80  Resp: 20 20  18   Temp: 97.8 F (36.6 C) 98 F (36.7 C)  98 F (36.7 C)  TempSrc: Oral Oral  Oral  SpO2: 100% 100% 100% 100%  Weight:   80.9 kg   Height:        General: Pt is alert, awake, not in acute distress Cardiovascular: RRR, S1/S2 +, no rubs, no gallops Respiratory: CTA bilaterally, no wheezing, no rhonchi Abdominal: Soft, NT, ND, bowel sounds + Extremities: no edema, no cyanosis    The results of significant diagnostics from this hospitalization (including imaging, microbiology, ancillary and laboratory) are listed below for reference.     Microbiology: Recent Results (from the past 240 hour(s))  SARS CORONAVIRUS 2 (TAT 6-24 HRS) Nasopharyngeal Nasopharyngeal Swab     Status: None   Collection Time: 05/24/19 12:38 PM   Specimen: Nasopharyngeal Swab  Result Value Ref Range Status   SARS Coronavirus 2 NEGATIVE NEGATIVE Final    Comment: (NOTE) SARS-CoV-2 target nucleic acids are NOT DETECTED. The SARS-CoV-2 RNA is generally detectable in upper and lower respiratory specimens during the acute phase of infection. Negative results do not preclude SARS-CoV-2 infection, do not rule out co-infections with other pathogens, and should not be used as the sole basis for treatment or other patient management decisions. Negative results must be combined with clinical observations, patient history, and epidemiological information. The expected result is Negative. Fact Sheet for Patients: SugarRoll.be Fact Sheet for Healthcare Providers: https://www.woods-mathews.com/ This test is not yet approved or cleared by the [place] FDA and  has been authorized for detection and/or diagnosis of SARS-CoV-2 by FDA under an Emergency  Use Authorization (EUA). This EUA will remain  in effect (meaning this test can be used) for the  duration of the COVID-19 declaration under Section 56 4(b)(1) of the Act, 21 U.S.C. section 360bbb-3(b)(1), unless the authorization is terminated or revoked sooner. Performed at [facility], [place] [address], [place], [place] [address]      Labs: BNP (last 3 results) No results for input(s): BNP in the last 8760 hours. Basic Metabolic Panel: Recent Labs  Lab 05/24/19 0922 05/25/19 0716  NA 138 136  K 3.9 3.7  CL 103 101  CO2 27 26  GLUCOSE 110* 107*  BUN 9 10  CREATININE 0.66 0.64  CALCIUM 8.9 8.7*   Liver Function Tests: Recent Labs  Lab 05/24/19 0922  AST 74*  ALT 63*  ALKPHOS 144*  BILITOT 0.7  PROT 7.7  ALBUMIN 3.8   No results for input(s): LIPASE, AMYLASE in the last 168 hours. No results for input(s): AMMONIA in the last 168 hours. CBC: Recent Labs  Lab 05/24/19 0922 05/25/19 0716  WBC 3.6* 3.5*  NEUTROABS 1.6*  --   HGB 14.1 14.7  HCT 42.0 43.1  MCV 89.2 88.5  PLT 181 177   Cardiac Enzymes: No results for input(s): CKTOTAL, CKMB, CKMBINDEX, TROPONINI in the last 168 hours. BNP: Invalid input(s): POCBNP CBG: No results for input(s): GLUCAP in the last 168 hours. D-Dimer No results for input(s): DDIMER in the last 72 hours. Hgb A1c No results for input(s): HGBA1C in the last 72 hours. Lipid Profile No results for input(s): CHOL, HDL, LDLCALC, TRIG, CHOLHDL, LDLDIRECT in the last 72 hours. Thyroid function studies No results for input(s): TSH, T4TOTAL, T3FREE, THYROIDAB in the last 72 hours.  Invalid input(s): FREET3 Anemia work up No results for input(s): VITAMINB12, FOLATE, FERRITIN, TIBC, IRON, RETICCTPCT in the last 72 hours. Urinalysis    Component Value Date/Time   COLORURINE AMBER (A) 04/27/2019 0600   APPEARANCEUR CLEAR (A) 04/27/2019 0600   LABSPEC 1.010 04/27/2019 0600   PHURINE 5.0 04/27/2019 0600   GLUCOSEU NEGATIVE 04/27/2019 0600   HGBUR NEGATIVE 04/27/2019 0600   BILIRUBINUR NEGATIVE 04/27/2019 0600   KETONESUR  NEGATIVE 04/27/2019 0600   PROTEINUR NEGATIVE 04/27/2019 0600   NITRITE POSITIVE (A) 04/27/2019 0600   LEUKOCYTESUR NEGATIVE 04/27/2019 0600   Sepsis Labs Invalid input(s): PROCALCITONIN,  WBC,  LACTICIDVEN Microbiology Recent Results (from the past 240 hour(s))  SARS CORONAVIRUS 2 (TAT 6-24 HRS) Nasopharyngeal Nasopharyngeal Swab     Status: None   Collection Time: 05/24/19 12:38 PM   Specimen: Nasopharyngeal Swab  Result Value Ref Range Status   SARS Coronavirus 2 NEGATIVE NEGATIVE Final    Comment: (NOTE) SARS-CoV-2 target nucleic acids are NOT DETECTED. The SARS-CoV-2 RNA is generally detectable in upper and lower respiratory specimens during the acute phase of infection. Negative results do not preclude SARS-CoV-2 infection, do not rule out co-infections with other pathogens, and should not be used as the sole basis for treatment or other patient management decisions. Negative results must be combined with clinical observations, patient history, and epidemiological information. The expected result is Negative. Fact Sheet for Patients: SugarRoll.be Fact Sheet for Healthcare Providers: https://www.woods-mathews.com/ This test is not yet approved or cleared by the [place] FDA and  has been authorized for detection and/or diagnosis of SARS-CoV-2 by FDA under an Emergency Use Authorization (EUA). This EUA will remain  in effect (meaning this test can be used) for the duration of the COVID-19 declaration under Section 56 4(b)(1)  of the Act, 21 U.S.C. section 360bbb-3(b)(1), unless the authorization is terminated or revoked sooner. Performed at [facility], [place] [address], [place], [place] [address]      Time coordinating discharge: Over 30 minutes  SIGNED:   Sidney Ace, MD  [facility] 05/25/2019, 11:37 AM Pager   If 7PM-7AM, please contact night-coverage [email or web address]

## 2019-05-25 NOTE — Progress Notes (Signed)
PROGRESS NOTE    Teresa [name]  [id number] DOB: [birth year] DOA: 05/24/2019 PCP: Baxter Hire, MD    Brief Narrative:  HPI: Teresa [name] is a 49 y.o. female with medical history significant of hypertension, asthma, GERD, depression with anxiety, bipolar disorder, HIV (CD4 =640 on 03/14/2019), HCV, migraine headache, who presents with headache, elevated blood pressure.  Patient states that she has been having worsening headache in the past 2 days, which is located in the front head, bandlike feeling, 10 out of 10 in severity initially, currently 5-8 out of 10 severity, dull, nonradiating.  Patient does not have unilateral weakness, numbness or tingling in extremities, no facial droop or slurred speech.  Patient denies chest pain, shortness breath, cough, fever or chills.  No nausea, vomiting, diarrhea, abdominal pain, symptoms of UTI.  Patient states that because of elevated blood pressure, her lisinopril dose was increased from 10 mg to 20 mg daily last week by her doctor, but her blood pressure is still elevated.  Her blood pressure is 188/131 in ED.  1/29: Patient still with refractory hypertension and persistent migraine headache despite escalation in antihypertensive regimen and acute 2-hour Imitrex.  Assessment & Plan:   Principal Problem:   Hypertensive urgency Active Problems:   Hepatitis C infection   HIV disease (HCC)   Migraine headache   HTN, goal below 130/80   Asthma   Depression with anxiety  Hypertensive urgency and hx of HTN:  CT-head negative. No CP or SOB.  No focal neurologic findings on physical examination.   Patient is currently taking lisinopril 20 mg daily.   Patient was given 0.1 mg of clonidine in ED.   Blood pressure still elevated. Plan: Continue lisinopril, increase dose to 40 mg daily Continue amlodipine 10 mg daily Hydrochlorothiazide 25 mg daily Labetalol IV as needed Blood pressure still not meeting treatment goals despite escalation  in therapy.  Severity of symptoms require continued inpatient admission and monitoring.  Patient meets criteria for inpatient at this time.  Inpatient admission order placed  Migraine headache:  Patient has has worsening headache, may be due to elevated blood pressure.   Patient appears well.   S/p Reglan and Benadryl in ED -Blood pressure control -As needed sumatriptan -As needed Fioricet   Hepatitis C infection:  not treated in the past. LFT with ALP 144, AST 74, ALT 64, total bilirubin 0.7. -Need to follow-up with ID as outpatient  HIV disease ([place]):  CD4 =640 on 03/14/2019. -continue home meds   Asthma: stable. -prn albuterol   Depression with anxiety:  no SI or HI. -continue home meds.   DVT prophylaxis: Lovenox Code Status: Full Family Communication: None today Disposition Plan: Anticipate home 05/26/2019 if blood pressure control improved and migraine headache resolved  Consultants:   None  Procedures:   None  Antimicrobials:   None   Subjective: Seen and examined Still markedly hypertensive Still with headache  Objective: Vitals:   05/25/19 0358 05/25/19 0806 05/25/19 1300 05/25/19 1400  BP: (!) 149/106 (!) 152/108 (!) 170/115 (!) 146/104  Pulse: 90 80 82 (!) 115  Resp:  18 17   Temp:  98 F (36.7 C)    TempSrc:  Oral    SpO2: 100% 100% 100%   Weight: 80.9 kg     Height:        Intake/Output Summary (Last 24 hours) at 05/25/2019 1443 Last data filed at 05/25/2019 0400 Gross per 24 hour  Intake -  Output 800 ml  Net -800 ml   Filed Weights   05/24/19 0843 05/24/19 1546 05/25/19 0358  Weight: 86 kg 81.1 kg 80.9 kg    Examination:  General exam: Appears calm and comfortable  Respiratory system: Clear to auscultation. Respiratory effort normal. Cardiovascular system: S1 & S2 heard, RRR. No JVD, murmurs, rubs, gallops or clicks. No pedal edema. Gastrointestinal system: Abdomen is nondistended, soft and nontender. No organomegaly  or masses felt. Normal bowel sounds heard. Central nervous system: Alert and oriented. No focal neurological deficits. Extremities: Symmetric 5 x 5 power. Skin: No rashes, lesions or ulcers Psychiatry: Judgement and insight appear normal. Mood & affect appropriate.     Data Reviewed: I have personally reviewed following labs and imaging studies  CBC: Recent Labs  Lab 05/24/19 0922 05/25/19 0716  WBC 3.6* 3.5*  NEUTROABS 1.6*  --   HGB 14.1 14.7  HCT 42.0 43.1  MCV 89.2 88.5  PLT 181 [id number]   Basic Metabolic Panel: Recent Labs  Lab 05/24/19 0922 05/25/19 0716  NA 138 136  K 3.9 3.7  CL 103 101  CO2 27 26  GLUCOSE 110* 107*  BUN 9 10  CREATININE 0.66 0.64  CALCIUM 8.9 8.7*   GFR: Estimated Creatinine Clearance: 96.1 mL/min (by C-G formula based on SCr of 0.64 mg/dL). Liver Function Tests: Recent Labs  Lab 05/24/19 0922  AST 74*  ALT 63*  ALKPHOS 144*  BILITOT 0.7  PROT 7.7  ALBUMIN 3.8   No results for input(s): LIPASE, AMYLASE in the last 168 hours. No results for input(s): AMMONIA in the last 168 hours. Coagulation Profile: No results for input(s): INR, PROTIME in the last 168 hours. Cardiac Enzymes: No results for input(s): CKTOTAL, CKMB, CKMBINDEX, TROPONINI in the last 168 hours. BNP (last 3 results) No results for input(s): PROBNP in the last 8760 hours. HbA1C: No results for input(s): HGBA1C in the last 72 hours. CBG: No results for input(s): GLUCAP in the last 168 hours. Lipid Profile: No results for input(s): CHOL, HDL, LDLCALC, TRIG, CHOLHDL, LDLDIRECT in the last 72 hours. Thyroid Function Tests: No results for input(s): TSH, T4TOTAL, FREET4, T3FREE, THYROIDAB in the last 72 hours. Anemia Panel: No results for input(s): VITAMINB12, FOLATE, FERRITIN, TIBC, IRON, RETICCTPCT in the last 72 hours. Sepsis Labs: No results for input(s): PROCALCITON, LATICACIDVEN in the last 168 hours.  Recent Results (from the past 240 hour(s))  SARS CORONAVIRUS  2 (TAT 6-24 HRS) Nasopharyngeal Nasopharyngeal Swab     Status: None   Collection Time: 05/24/19 12:38 PM   Specimen: Nasopharyngeal Swab  Result Value Ref Range Status   SARS Coronavirus 2 NEGATIVE NEGATIVE Final    Comment: (NOTE) SARS-CoV-2 target nucleic acids are NOT DETECTED. The SARS-CoV-2 RNA is generally detectable in upper and lower respiratory specimens during the acute phase of infection. Negative results do not preclude SARS-CoV-2 infection, do not rule out co-infections with other pathogens, and should not be used as the sole basis for treatment or other patient management decisions. Negative results must be combined with clinical observations, patient history, and epidemiological information. The expected result is Negative. Fact Sheet for Patients: SugarRoll.be Fact Sheet for Healthcare Providers: https://www.woods-mathews.com/ This test is not yet approved or cleared by the [place] FDA and  has been authorized for detection and/or diagnosis of SARS-CoV-2 by FDA under an Emergency Use Authorization (EUA). This EUA will remain  in effect (meaning this test can be used) for the duration of the COVID-19 declaration under Section 56 4(b)(1) of the  Act, 21 U.S.C. section 360bbb-3(b)(1), unless the authorization is terminated or revoked sooner. Performed at [facility], [place] [address] [place], [place] [address]          Radiology Studies: CT Head Wo Contrast  Result Date: 05/24/2019 CLINICAL DATA:  Headache and hypertension. Suspected subarachnoid hemorrhage. EXAM: CT HEAD WITHOUT CONTRAST TECHNIQUE: Contiguous axial images were obtained from the base of the skull through the vertex without intravenous contrast. COMPARISON:  None. FINDINGS: Brain: The brain shows a normal appearance without evidence of malformation, atrophy, old or acute small or large vessel infarction, mass lesion, hemorrhage, hydrocephalus or  extra-axial collection. Vascular: No hyperdense vessel. No evidence of atherosclerotic calcification. Skull: Normal.  No traumatic finding.  No focal bone lesion. Sinuses/Orbits: Sinuses are clear. Orbits appear normal. Mastoids are clear. Other: None significant IMPRESSION: Normal head CT Electronically Signed   By: Nelson Chimes M.D.   On: 05/24/2019 11:23        Scheduled Meds: . abacavir-dolutegravir-lamiVUDine  1 tablet Oral Daily  . amLODipine  10 mg Oral Daily  . buPROPion  150 mg Oral Daily  . enoxaparin (LOVENOX) injection  40 mg Subcutaneous Q24H  . fluconazole  100 mg Oral Daily  . hydrochlorothiazide  25 mg Oral Daily  . [START ON 05/26/2019] lisinopril  40 mg Oral Daily  . pantoprazole  40 mg Oral Daily  . sertraline  150 mg Oral QHS   Continuous Infusions:   LOS: 0 days    Time spent: 35 minutes    Sidney Ace, MD [facility] Pager [phone]  If 7PM-7AM, please contact night-coverage [email or web address] Password [facility] 05/25/2019, 2:43 PM

## 2019-05-26 MED ORDER — SUMATRIPTAN SUCCINATE 50 MG PO TABS: 50.0000 mg | ORAL_TABLET | ORAL | 5 refills | Status: DC | PRN

## 2019-05-26 MED ORDER — METOPROLOL TARTRATE 25 MG PO TABS
25.0000 mg | ORAL_TABLET | Freq: Two times a day (BID) | ORAL | Status: DC
  Administered 2019-05-26 – 2019-05-27 (×3): 25 mg via ORAL
  Filled 2019-05-26 (×3): qty 1

## 2019-05-26 MED ORDER — HYDROCHLOROTHIAZIDE 25 MG PO TABS: 25.0000 mg | ORAL_TABLET | Freq: Every day | ORAL | 0 refills | Status: DC

## 2019-05-26 MED ORDER — AMLODIPINE BESYLATE 10 MG PO TABS: 10.0000 mg | ORAL_TABLET | Freq: Every day | ORAL | 0 refills | Status: DC

## 2019-05-26 MED ORDER — VERAPAMIL HCL 80 MG PO TABS
80.0000 mg | ORAL_TABLET | Freq: Three times a day (TID) | ORAL | Status: DC
  Administered 2019-05-26 – 2019-05-27 (×3): 80 mg via ORAL
  Filled 2019-05-26 (×5): qty 1

## 2019-05-26 MED ORDER — LISINOPRIL 40 MG PO TABS: 40.0000 mg | ORAL_TABLET | Freq: Every day | ORAL | 0 refills | Status: DC

## 2019-05-26 MED ORDER — BUTALBITAL-APAP-CAFFEINE 50-325-40 MG PO TABS: 1.0000 | ORAL_TABLET | ORAL | 0 refills | Status: DC | PRN

## 2019-05-26 MED ORDER — KETOROLAC TROMETHAMINE 15 MG/ML IJ SOLN
15.0000 mg | Freq: Four times a day (QID) | INTRAMUSCULAR | Status: DC | PRN
  Administered 2019-05-26 (×2): 15 mg via INTRAVENOUS
  Filled 2019-05-26 (×3): qty 1

## 2019-05-26 NOTE — Plan of Care (Signed)

## 2019-05-26 NOTE — Plan of Care (Signed)
  Problem: Clinical Measurements: Goal: Ability to maintain clinical measurements within normal limits will improve Outcome: Progressing Goal: Diagnostic test results will improve Outcome: Progressing Goal: Respiratory complications will improve Outcome: Progressing Goal: Cardiovascular complication will be avoided Outcome: Progressing   Problem: Activity: Goal: Risk for activity intolerance will decrease Outcome: Progressing   

## 2019-05-26 NOTE — Progress Notes (Addendum)
PROGRESS NOTE    Teresa [name]  [id number] DOB: [birth year] DOA: 05/24/2019 PCP: Baxter Hire, MD    Brief Narrative:  HPI: Teresa [name] is a 49 y.o. female with medical history significant of hypertension, asthma, GERD, depression with anxiety, bipolar disorder, HIV (CD4 =640 on 03/14/2019), HCV, migraine headache, who presents with headache, elevated blood pressure.  Patient states that she has been having worsening headache in the past 2 days, which is located in the front head, bandlike feeling, 10 out of 10 in severity initially, currently 5-8 out of 10 severity, dull, nonradiating.  Patient does not have unilateral weakness, numbness or tingling in extremities, no facial droop or slurred speech.  Patient denies chest pain, shortness breath, cough, fever or chills.  No nausea, vomiting, diarrhea, abdominal pain, symptoms of UTI.  Patient states that because of elevated blood pressure, her lisinopril dose was increased from 10 mg to 20 mg daily last week by her doctor, but her blood pressure is still elevated.  Her blood pressure is 188/131 in ED.  1/29: Patient still with refractory hypertension and persistent migraine headache despite escalation in antihypertensive regimen and q2-hour Imitrex.  1/30: Blood pressure improved overall though remains elevated.  Diastolic hypertension especially concerning.  Patient tearful on my arrival however reports that life stressors have been high recently.  Continues to endorse migraine.  Questionable efficacy of Imitrex  Assessment & Plan:   Principal Problem:   Hypertensive urgency Active Problems:   Hepatitis C infection   HIV disease (HCC)   Migraine headache   HTN, goal below 130/80   Asthma   Depression with anxiety  Hypertensive urgency and hx of HTN:  CT-head negative. No CP or SOB.  No focal neurologic findings on physical examination.   Patient is currently taking lisinopril 20 mg daily.   Patient was given 0.1 mg of  clonidine in ED.   Blood pressure still elevated despite max dose of 3 agents Plan: Continue lisinopril 40 mg daily Stop amlodipine Start verapamil 80 mg p.o. 3 times daily Continue hydrochlorothiazide 25 mg daily Add metoprolol 25 mg twice daily Labetalol IV as needed Monitor on above regimen overnight considering severity.  If patient stable and blood pressure improved we will plan on discharge on 05/27/2019  Migraine headache:  Patient has has worsening headache, may be due to elevated blood pressure.   Patient appears well.   S/p Reglan and Benadryl in ED -Blood pressure control -As needed sumatriptan -As needed Fioricet -As needed Toradol -Verapamil will treat blood pressure and is used as migraine prophylaxis   Hepatitis C infection:  not treated in the past. LFT with ALP 144, AST 74, ALT 64, total bilirubin 0.7. -Need to follow-up with ID as outpatient  HIV disease ([place]):  CD4 =640 on 03/14/2019. -continue home meds   Asthma: stable. -prn albuterol   Depression with anxiety:  no SI or HI. -continue home meds.   DVT prophylaxis: Lovenox Code Status: Full Family Communication: None today Disposition Plan: Anticipate home 05/26/2019 if blood pressure control improved and migraine headache resolved  Consultants:   None  Procedures:   None  Antimicrobials:   None   Subjective: Seen and examined Still markedly hypertensive Still with headache  Objective: Vitals:   05/25/19 1936 05/26/19 0434 05/26/19 0755 05/26/19 1225  BP: (!) 151/102 (!) 152/107 (!) 155/110 (!) 161/108  Pulse: 85 91 86 95  Resp: 20 20 18 18   Temp: 97.9 F (36.6 C) 97.7 F (36.5 C)  97.6 F (36.4 C)   TempSrc: Oral Oral Oral   SpO2: 100% 100% 100% 99%  Weight:      Height:        Intake/Output Summary (Last 24 hours) at 05/26/2019 1350 Last data filed at 05/26/2019 1153 Gross per 24 hour  Intake --  Output 3100 ml  Net -3100 ml   Filed Weights   05/24/19 0843  05/24/19 1546 05/25/19 0358  Weight: 86 kg 81.1 kg 80.9 kg    Examination:  General exam: Appears calm and comfortable  Respiratory system: Clear to auscultation. Respiratory effort normal. Cardiovascular system: S1 & S2 heard, RRR. No JVD, murmurs, rubs, gallops or clicks. No pedal edema. Gastrointestinal system: Abdomen is nondistended, soft and nontender. No organomegaly or masses felt. Normal bowel sounds heard. Central nervous system: Alert and oriented. No focal neurological deficits. Extremities: Symmetric 5 x 5 power. Skin: No rashes, lesions or ulcers Psychiatry: Judgement and insight appear normal. Mood & affect appropriate.     Data Reviewed: I have personally reviewed following labs and imaging studies  CBC: Recent Labs  Lab 05/24/19 0922 05/25/19 0716  WBC 3.6* 3.5*  NEUTROABS 1.6*  --   HGB 14.1 14.7  HCT 42.0 43.1  MCV 89.2 88.5  PLT 181 123XX123   Basic Metabolic Panel: Recent Labs  Lab 05/24/19 0922 05/25/19 0716  NA 138 136  K 3.9 3.7  CL 103 101  CO2 27 26  GLUCOSE 110* 107*  BUN 9 10  CREATININE 0.66 0.64  CALCIUM 8.9 8.7*   GFR: Estimated Creatinine Clearance: 96.1 mL/min (by C-G formula based on SCr of 0.64 mg/dL). Liver Function Tests: Recent Labs  Lab 05/24/19 0922  AST 74*  ALT 63*  ALKPHOS 144*  BILITOT 0.7  PROT 7.7  ALBUMIN 3.8   No results for input(s): LIPASE, AMYLASE in the last 168 hours. No results for input(s): AMMONIA in the last 168 hours. Coagulation Profile: No results for input(s): INR, PROTIME in the last 168 hours. Cardiac Enzymes: No results for input(s): CKTOTAL, CKMB, CKMBINDEX, TROPONINI in the last 168 hours. BNP (last 3 results) No results for input(s): PROBNP in the last 8760 hours. HbA1C: No results for input(s): HGBA1C in the last 72 hours. CBG: No results for input(s): GLUCAP in the last 168 hours. Lipid Profile: No results for input(s): CHOL, HDL, LDLCALC, TRIG, CHOLHDL, LDLDIRECT in the last 72  hours. Thyroid Function Tests: No results for input(s): TSH, T4TOTAL, FREET4, T3FREE, THYROIDAB in the last 72 hours. Anemia Panel: No results for input(s): VITAMINB12, FOLATE, FERRITIN, TIBC, IRON, RETICCTPCT in the last 72 hours. Sepsis Labs: No results for input(s): PROCALCITON, LATICACIDVEN in the last 168 hours.  Recent Results (from the past 240 hour(s))  SARS CORONAVIRUS 2 (TAT 6-24 HRS) Nasopharyngeal Nasopharyngeal Swab     Status: None   Collection Time: 05/24/19 12:38 PM   Specimen: Nasopharyngeal Swab  Result Value Ref Range Status   SARS Coronavirus 2 NEGATIVE NEGATIVE Final    Comment: (NOTE) SARS-CoV-2 target nucleic acids are NOT DETECTED. The SARS-CoV-2 RNA is generally detectable in upper and lower respiratory specimens during the acute phase of infection. Negative results do not preclude SARS-CoV-2 infection, do not rule out co-infections with other pathogens, and should not be used as the sole basis for treatment or other patient management decisions. Negative results must be combined with clinical observations, patient history, and epidemiological information. The expected result is Negative. Fact Sheet for Patients: SugarRoll.be Fact Sheet for Healthcare Providers: https://www.woods-mathews.com/  This test is not yet approved or cleared by the [place] and  has been authorized for detection and/or diagnosis of SARS-CoV-2 by FDA under an Emergency Use Authorization (EUA). This EUA will remain  in effect (meaning this test can be used) for the duration of the COVID-19 declaration under Section 56 4(b)(1) of the Act, 21 U.S.C. section 360bbb-3(b)(1), unless the authorization is terminated or revoked sooner. Performed at [facility], [place] [address] [place], [place] [address]          Radiology Studies: No results found.      Scheduled Meds: . abacavir-dolutegravir-lamiVUDine  1 tablet Oral  Daily  . buPROPion  150 mg Oral Daily  . enoxaparin (LOVENOX) injection  40 mg Subcutaneous Q24H  . fluconazole  100 mg Oral Daily  . hydrochlorothiazide  25 mg Oral Daily  . lisinopril  40 mg Oral Daily  . metoprolol tartrate  25 mg Oral BID  . pantoprazole  40 mg Oral Daily  . sertraline  150 mg Oral QHS  . sodium chloride flush  10 mL Intravenous Q12H  . verapamil  80 mg Oral Q8H   Continuous Infusions:   LOS: 1 day    Time spent: 35 minutes    Sidney Ace, MD [facility] Pager [phone]  If 7PM-7AM, please contact night-coverage 05/26/2019, 1:50 PM

## 2019-05-27 MED ORDER — METOPROLOL TARTRATE 25 MG PO TABS: 25.0000 mg | ORAL_TABLET | Freq: Two times a day (BID) | ORAL | 0 refills | Status: DC

## 2019-05-27 MED ORDER — VERAPAMIL HCL 80 MG PO TABS: 80.0000 mg | ORAL_TABLET | Freq: Three times a day (TID) | ORAL | 0 refills | Status: DC

## 2019-05-27 NOTE — Discharge Instructions (Signed)
Suggest looking into riboflavin over the counter as migraine prophylaxis

## 2019-05-29 ENCOUNTER — Telehealth: Payer: Self-pay | Admitting: Gastroenterology

## 2019-05-29 ENCOUNTER — Encounter (INDEPENDENT_AMBULATORY_CARE_PROVIDER_SITE_OTHER): Payer: Self-pay

## 2019-05-29 DIAGNOSIS — B1920 Unspecified viral hepatitis C without hepatic coma: Secondary | ICD-10-CM

## 2019-05-29 DIAGNOSIS — R772 Abnormality of alphafetoprotein: Secondary | ICD-10-CM

## 2019-05-29 NOTE — Telephone Encounter (Signed)
She only needs to see me after the MRI.  Also get LFTs, GGT, AFP repeated before she sees me

## 2019-05-29 NOTE — Telephone Encounter (Signed)
She only needs to see me after she has had the MRI, LFTs, GGT, AFP.  Should not see me before that.  Do you know what happened to the MRI?

## 2019-05-29 NOTE — Telephone Encounter (Signed)
Does patient need any labs for tomorrow

## 2019-05-29 NOTE — Telephone Encounter (Signed)
Called and left a message for call back. Order lab work for patient. Patient called back and states she did not do MRI because she was in the hospital. Called and got her MRI scheduled for 06/04/2019 [facility] Nothing to eat or drank for 4 hours before scan Patient will do the blood work on the same day. Made patient a follow up appointment on 06/08/2019

## 2019-05-29 NOTE — Telephone Encounter (Signed)
Pt left vm she has an apt tomorrow and wants to know if she needs to come today for testing?

## 2019-05-30 ENCOUNTER — Ambulatory Visit: Payer: Medicaid Other | Admitting: Gastroenterology

## 2019-06-04 ENCOUNTER — Ambulatory Visit: Admission: RE | Admit: 2019-06-04 | Payer: Medicaid Other | Source: Ambulatory Visit | Admitting: Gastroenterology

## 2019-06-04 ENCOUNTER — Other Ambulatory Visit
Admission: RE | Admit: 2019-06-04 | Discharge: 2019-06-04 | Disposition: A | Discharge status: Home / Self Care | Payer: Medicaid Other | Source: Ambulatory Visit | Attending: Gastroenterology | Admitting: Gastroenterology

## 2019-06-04 ENCOUNTER — Other Ambulatory Visit: Payer: Self-pay

## 2019-06-04 DIAGNOSIS — R772 Abnormality of alphafetoprotein: Secondary | ICD-10-CM | POA: Diagnosis not present

## 2019-06-04 DIAGNOSIS — B1920 Unspecified viral hepatitis C without hepatic coma: Secondary | ICD-10-CM | POA: Diagnosis not present

## 2019-06-04 LAB — HEPATIC FUNCTION PANEL
ALT: 102 U/L — ABNORMAL HIGH (ref 0–44)
AST: 120 U/L — ABNORMAL HIGH (ref 15–41)
Albumin: 4 g/dL (ref 3.5–5.0)
Alkaline Phosphatase: 140 U/L — ABNORMAL HIGH (ref 38–126)
Bilirubin, Direct: 0.1 mg/dL (ref 0.0–0.2)
Indirect Bilirubin: 0.5 mg/dL (ref 0.3–0.9)
Total Bilirubin: 0.6 mg/dL (ref 0.3–1.2)
Total Protein: 8.5 g/dL — ABNORMAL HIGH (ref 6.5–8.1)

## 2019-06-04 LAB — GAMMA GT: GGT: 454 U/L — ABNORMAL HIGH (ref 7–50)

## 2019-06-05 LAB — AFP TUMOR MARKER: AFP, Serum, Tumor Marker: 54.8 ng/mL — ABNORMAL HIGH (ref 0.0–8.3)

## 2019-06-08 ENCOUNTER — Ambulatory Visit: Payer: Medicaid Other | Admitting: Gastroenterology

## 2019-06-12 ENCOUNTER — Ambulatory Visit: Admission: RE | Admit: 2019-06-12 | Payer: Medicaid Other | Source: Ambulatory Visit

## 2019-06-14 ENCOUNTER — Other Ambulatory Visit: Payer: Self-pay

## 2019-06-15 ENCOUNTER — Ambulatory Visit
Admission: RE | Admit: 2019-06-15 | Discharge: 2019-06-15 | Disposition: A | Discharge status: Home / Self Care | Payer: Medicaid Other | Source: Ambulatory Visit | Attending: Gastroenterology | Admitting: Gastroenterology

## 2019-06-15 ENCOUNTER — Other Ambulatory Visit: Payer: Self-pay

## 2019-06-15 DIAGNOSIS — B1920 Unspecified viral hepatitis C without hepatic coma: Secondary | ICD-10-CM

## 2019-06-15 DIAGNOSIS — R772 Abnormality of alphafetoprotein: Secondary | ICD-10-CM | POA: Diagnosis present

## 2019-06-15 MED ORDER — GADOBUTROL 1 MMOL/ML IV SOLN
7.5000 mL | Freq: Once | INTRAVENOUS | Status: AC | PRN
  Administered 2019-06-15: 09:00:00 7.5 mL via INTRAVENOUS

## 2019-06-17 ENCOUNTER — Encounter: Payer: Self-pay | Admitting: Gastroenterology

## 2019-06-20 ENCOUNTER — Ambulatory Visit (INDEPENDENT_AMBULATORY_CARE_PROVIDER_SITE_OTHER): Payer: Medicaid Other | Admitting: Gastroenterology

## 2019-06-20 ENCOUNTER — Telehealth: Payer: Self-pay

## 2019-06-20 ENCOUNTER — Other Ambulatory Visit: Payer: Self-pay | Admitting: Infectious Diseases

## 2019-06-20 DIAGNOSIS — B1920 Unspecified viral hepatitis C without hepatic coma: Secondary | ICD-10-CM

## 2019-06-20 DIAGNOSIS — B20 Human immunodeficiency virus [HIV] disease: Secondary | ICD-10-CM

## 2019-06-20 DIAGNOSIS — R772 Abnormality of alphafetoprotein: Secondary | ICD-10-CM

## 2019-06-20 MED ORDER — TRIUMEQ 600-50-300 MG PO TABS: 1.0000 | ORAL_TABLET | Freq: Every day | ORAL | 3 refills | Status: DC

## 2019-06-20 NOTE — Telephone Encounter (Signed)
-----   Message from [name], MD sent at 06/17/2019  9:30 AM EST ----- Sherald Hess inform   1. MRI shows no liver mass 2. AFP is lower when rechecked, elevated GGT suggests liver inflammation and very likely the cause of elevated AFP 3. Suggest recheck AFP after hepatitis C treatment when I would expect it to drop down  4. Telephone visit with me in 4 months with repeat AFP  C/C Dr Shelly Flatten

## 2019-06-20 NOTE — Addendum Note (Signed)
Addended by: [name] on: 06/20/2019 09:24 AM   Modules accepted: Level of Service

## 2019-06-20 NOTE — Progress Notes (Signed)
Teresa [name] , MD [address]  [place], [place] [address]  Main: [phone]  Fax: [phone]   Primary Care Physician: Baxter Hire, MD  Virtual Visit via Video Note  I connected with patient on 06/20/19 at  9:15 AM EST by video and verified that I am speaking with the correct person using two identifiers.   I discussed the limitations, risks, security and privacy concerns of performing an evaluation and management service by video  and the availability of in person appointments. I also discussed with the patient that there may be a patient responsible charge related to this service. The patient expressed understanding and agreed to proceed.  Location of Patient: Home Location of Provider: Home Persons involved: Patient and provider only   History of Present Illness:   Follow-up for elevated hepatic enzymes  HPI: Teresa [name] is a 49 y.o. female  Summary of history :  Initially referred and seen in December 2020 after being referred for an elevated AFP by Dr. Levester Fresh.She follows with Dr. [name]  for  HIV and hepatitis C coinfection.  She is on treatment for HIV but not on any hepatitis C treatment yet.  She underwent an ultrasound elastography in October 2020 which showed normal appearance of the liver spleen.  No clear evidence of liver cirrhosis.  Positive hepatitis C viral load of 16.7 million in October 2020.,  INR 1.0.  Hepatitis C genotype 1a.  aFP noted to be 73.6.  AST 107, ALT 94 and alkaline phosphatase of 136.  No prior alpha-fetoprotein levels to compare with.  Denies any excess alcohol consumption, used to take atleast 4 tylenols a day but stopped in November 2020. Denies any OTC meds except vitamin D and no herbal supplements/ Denies any abdominal pain.   Interval history 04/18/2019-06/20/2019  06/15/2019: MRI of the liver shows no worrisome hepatic lesions.  No evidence of portal hypertension or collaterals or splenomegaly.   06/04/2019 hepatic function panel alkaline phosphatase 140, AST 120, ALT 102, gamma GT of 454.  AFP 54.8 which is lower than what it was previously at 73.6.  She is doing well no new complaints.  She said he recently underwent surgery for repair of her hernia and having some pain at the surgery site but nothing too severe.  Shehas not yet started treatment for hepatitis C.  She says she is about to run out of her HIV medication at was trying to get in touch with Dr. Danielle Rankin office for a refill.    Current Outpatient Medications  Medication Sig Dispense Refill  . abacavir-dolutegravir-lamiVUDine (TRIUMEQ) 600-50-300 MG tablet Take 1 tablet by mouth daily. 30 tablet 3  . albuterol (PROAIR HFA) 108 (90 Base) MCG/ACT inhaler Inhale 2 puffs into the lungs every 6 (six) hours as needed for wheezing or shortness of breath.     Marland Kitchen amLODipine (NORVASC) 10 MG tablet as directed    . buPROPion (WELLBUTRIN) 75 MG tablet Take 150 mg by mouth daily.     . butalbital-acetaminophen-caffeine (FIORICET) 50-325-40 MG tablet Take 1 tablet by mouth every 4 (four) hours as needed for headache or migraine. (Patient not taking: Reported on 06/08/2019) 14 tablet 0  . diclofenac Sodium (VOLTAREN) 1 % GEL Apply 2 g topically 4 (four) times daily.     . Galcanezumab-gnlm (EMGALITY) 120 MG/ML SOSY Inject 120 mg into the skin every 28 (twenty-eight) days.    . Glycopyrronium Tosylate 2.4 % PADS Apply topically.    Marland Kitchen  hydrochlorothiazide (HYDRODIURIL) 25 MG tablet Take 1 tablet (25 mg total) by mouth daily. 30 tablet 0  . lisinopril (ZESTRIL) 40 MG tablet Take 1 tablet (40 mg total) by mouth daily. 30 tablet 0  . metoprolol tartrate (LOPRESSOR) 25 MG tablet Take 1 tablet (25 mg total) by mouth 2 (two) times daily. 60 tablet 0  . pantoprazole (PROTONIX) 40 MG tablet Take 40 mg by mouth daily.    . sertraline (ZOLOFT) 100 MG tablet Take 150 mg by mouth at bedtime.     . SUMAtriptan (IMITREX) 50 MG tablet Take 1 tablet (50 mg  total) by mouth every 2 (two) hours as needed for migraine or headache. May repeat in 2 hours if headache persists or recurs. 10 tablet 5  . verapamil (CALAN) 80 MG tablet Take 1 tablet (80 mg total) by mouth 3 (three) times daily. 90 tablet 0   No current facility-administered medications for this visit.    Allergies as of 06/20/2019 - Review Complete 06/08/2019  Allergen Reaction Noted  . Naproxen sodium Hives 11/12/2014    Review of Systems:    All systems reviewed and negative except where noted in HPI.  General Appearance:    Alert, cooperative, no distress, appears stated age  Head:    Normocephalic, without obvious abnormality, atraumatic  Eyes:    PERRL, conjunctiva/corneas clear,  Ears:    Grossly normal hearing    Neurologic:  Grossly normal    Observations/Objective:  Labs: CMP     Component Value Date/Time   NA 136 05/25/2019 0716   K 3.7 05/25/2019 0716   CL 101 05/25/2019 0716   CO2 26 05/25/2019 0716   GLUCOSE 107 (H) 05/25/2019 0716   BUN 10 05/25/2019 0716   CREATININE 0.64 05/25/2019 0716   CALCIUM 8.7 (L) 05/25/2019 0716   PROT 8.5 (H) 06/04/2019 0845   ALBUMIN 4.0 06/04/2019 0845   AST 120 (H) 06/04/2019 0845   ALT 102 (H) 06/04/2019 0845   ALKPHOS 140 (H) 06/04/2019 0845   BILITOT 0.6 06/04/2019 0845   GFRNONAA >60 05/25/2019 0716   GFRAA >60 05/25/2019 0716   Lab Results  Component Value Date   WBC 3.5 (L) 05/25/2019   HGB 14.7 05/25/2019   HCT 43.1 05/25/2019   MCV 88.5 05/25/2019   PLT 177 05/25/2019    Imaging Studies: CT Head Wo Contrast  Result Date: 05/24/2019 CLINICAL DATA:  Headache and hypertension. Suspected subarachnoid hemorrhage. EXAM: CT HEAD WITHOUT CONTRAST TECHNIQUE: Contiguous axial images were obtained from the base of the skull through the vertex without intravenous contrast. COMPARISON:  None. FINDINGS: Brain: The brain shows a normal appearance without evidence of malformation, atrophy, old or acute small or large  vessel infarction, mass lesion, hemorrhage, hydrocephalus or extra-axial collection. Vascular: No hyperdense vessel. No evidence of atherosclerotic calcification. Skull: Normal.  No traumatic finding.  No focal bone lesion. Sinuses/Orbits: Sinuses are clear. Orbits appear normal. Mastoids are clear. Other: None significant IMPRESSION: Normal head CT Electronically Signed   By: Nelson Chimes M.D.   On: 05/24/2019 11:23   MR LIVER W WO CONTRAST  Result Date: 06/15/2019 CLINICAL DATA:  Chronic liver disease. Hepatitis C screening. Elevated AFP level. EXAM: MRI ABDOMEN WITHOUT AND WITH CONTRAST TECHNIQUE: Multiplanar multisequence MR imaging of the abdomen was performed both before and after the administration of intravenous contrast. CONTRAST:  7.28mL GADAVIST GADOBUTROL 1 MMOL/ML IV SOLN COMPARISON:  CT scan 05/06/2019 FINDINGS: Lower chest: The lung bases are clear of an acute process.  No worrisome pulmonary lesions. No pleural or pericardial effusion. Hepatobiliary: Small simple hepatic cyst noted at the hepatic dome and segment 7. No worrisome hepatic lesions or intrahepatic biliary dilatation. On the early arterial phase postcontrast images there are 3 small peripheral vascular shunts. No worrisome enhancing lesions to suggest dysplastic nodules or HCC. I do not see any MR morphologic features of cirrhosis involving the liver. No evidence of portal venous hypertension, portal venous collaterals or splenomegaly. Pancreas: No mass, inflammation or ductal dilatation. Normal caliber and course of the main pancreatic duct. Spleen:  Normal size. No focal lesions. Adrenals/Urinary Tract: The adrenal glands and kidneys are normal. Small peripheral cortical defect involving the right kidney inferiorly is likely an old infarct or old focus of infection. Stomach/Bowel: The stomach, duodenum, visualized small bowel and visualized colon are unremarkable. Vascular/Lymphatic: The aorta and branch vessels are patent. The major  venous structures are patent. No mesenteric or retroperitoneal mass or adenopathy. Other:  No ascites or abdominal wall hernia. Musculoskeletal: No significant bony findings. IMPRESSION: 1. Unremarkable MR appearance of the liver. No MR features of cirrhosis. No worrisome hepatic lesions or intrahepatic biliary dilatation. 2. No evidence of portal venous hypertension, portal venous collaterals or splenomegaly. 3. No acute abdominal findings, mass lesions or adenopathy. Electronically Signed   By: Marijo Sanes M.D.   On: 06/15/2019 09:34    Assessment and Plan:   SUSY FLUG is a 49 y.o. y/o female with a history of HIV and chronic hepatitis C treatment nave.  No biochemical or radiological evidence of liver cirrhosis.  I have been consulted for an elevated AFP noted incidentally on labs.Elevated alpha-fetoprotein levels may be seen in chronic liver disease without HCC.  Per her recent labs there is evidence of hepatitis with elevated AST and ALT levels.  It is possible that the alpha-fetoprotein levels may be elevated due to the same+/- effects of HIV/Hep C +/- excess tyelenol.    Rarely elevated alpha-fetoprotein levels may be seen in gastric cancer.  Plan 1.    Would recommend to proceed with treatment for chronic hepatitis C with Dr. [name].  I will recheck her AFP transaminases and GGT after she has completed treatment and as sustained SVR.  I would expect the AFP to have normalized at that point if it is related to hepatitis from the hepatitis C.  2.  I will get in touch with Dr. Levester Fresh as she is requesting refill of her HIV meds which she says she is running out today.  Follow-up in 3 months telephone visit     I discussed the assessment and treatment plan with the patient. The patient was provided an opportunity to ask questions and all were answered. The patient agreed with the plan and demonstrated an understanding of the instructions.   The patient was advised to call  back or seek an in-person evaluation if the symptoms worsen or if the condition fails to improve as anticipated.   Dr Teresa Bellows MD,MRCP [facility]) Gastroenterology/Hepatology Pager: [phone]   Speech recognition software was used to dictate this note.

## 2019-06-20 NOTE — Telephone Encounter (Signed)
Called and left a message for call back. Put patient on a recall list for 4 months for a follow up appointment

## 2019-06-20 NOTE — Telephone Encounter (Signed)
Patient was informed of lab results at appointment today

## 2019-08-17 ENCOUNTER — Other Ambulatory Visit: Payer: Self-pay

## 2019-08-17 DIAGNOSIS — B20 Human immunodeficiency virus [HIV] disease: Secondary | ICD-10-CM

## 2019-08-17 DIAGNOSIS — B182 Chronic viral hepatitis C: Secondary | ICD-10-CM

## 2019-08-22 ENCOUNTER — Encounter (HOSPITAL_COMMUNITY): Payer: Self-pay | Admitting: Emergency Medicine

## 2019-08-22 ENCOUNTER — Emergency Department (HOSPITAL_COMMUNITY)
Admission: EM | Admit: 2019-08-22 | Discharge: 2019-08-23 | Disposition: A | Discharge status: Home / Self Care | Payer: Medicaid Other | Attending: Emergency Medicine | Admitting: Emergency Medicine

## 2019-08-22 ENCOUNTER — Other Ambulatory Visit: Payer: Self-pay

## 2019-08-22 DIAGNOSIS — Z21 Asymptomatic human immunodeficiency virus [HIV] infection status: Secondary | ICD-10-CM | POA: Diagnosis not present

## 2019-08-22 DIAGNOSIS — I10 Essential (primary) hypertension: Secondary | ICD-10-CM | POA: Diagnosis not present

## 2019-08-22 DIAGNOSIS — R1032 Left lower quadrant pain: Secondary | ICD-10-CM | POA: Diagnosis present

## 2019-08-22 DIAGNOSIS — J45909 Unspecified asthma, uncomplicated: Secondary | ICD-10-CM | POA: Insufficient documentation

## 2019-08-22 DIAGNOSIS — Z79899 Other long term (current) drug therapy: Secondary | ICD-10-CM | POA: Diagnosis not present

## 2019-08-22 DIAGNOSIS — Z87891 Personal history of nicotine dependence: Secondary | ICD-10-CM | POA: Diagnosis not present

## 2019-08-22 DIAGNOSIS — N83209 Unspecified ovarian cyst, unspecified side: Secondary | ICD-10-CM

## 2019-08-22 DIAGNOSIS — R102 Pelvic and perineal pain: Secondary | ICD-10-CM | POA: Insufficient documentation

## 2019-08-22 DIAGNOSIS — N83202 Unspecified ovarian cyst, left side: Secondary | ICD-10-CM | POA: Diagnosis not present

## 2019-08-22 LAB — URINALYSIS, ROUTINE W REFLEX MICROSCOPIC
Bilirubin Urine: NEGATIVE
Glucose, UA: NEGATIVE mg/dL
Ketones, ur: NEGATIVE mg/dL
Leukocytes,Ua: NEGATIVE
Nitrite: NEGATIVE
Protein, ur: NEGATIVE mg/dL
Specific Gravity, Urine: 1.01 (ref 1.005–1.030)
pH: 6 (ref 5.0–8.0)

## 2019-08-22 LAB — CBC
HCT: 38.9 % (ref 36.0–46.0)
Hemoglobin: 12.8 g/dL (ref 12.0–15.0)
MCH: 30.3 pg (ref 26.0–34.0)
MCHC: 32.9 g/dL (ref 30.0–36.0)
MCV: 92 fL (ref 80.0–100.0)
Platelets: 183 10*3/uL (ref 150–400)
RBC: 4.23 MIL/uL (ref 3.87–5.11)
RDW: 13.5 % (ref 11.5–15.5)
WBC: 3 10*3/uL — ABNORMAL LOW (ref 4.0–10.5)
nRBC: 0 % (ref 0.0–0.2)

## 2019-08-22 LAB — BASIC METABOLIC PANEL
Anion gap: 8 (ref 5–15)
BUN: 8 mg/dL (ref 6–20)
CO2: 25 mmol/L (ref 22–32)
Calcium: 8.7 mg/dL — ABNORMAL LOW (ref 8.9–10.3)
Chloride: 106 mmol/L (ref 98–111)
Creatinine, Ser: 0.71 mg/dL (ref 0.44–1.00)
GFR calc Af Amer: >60 mL/min (ref >60)
GFR calc non Af Amer: >60 mL/min (ref >60)
Glucose, Bld: 122 mg/dL — ABNORMAL HIGH (ref 70–99)
Potassium: 3.4 mmol/L — ABNORMAL LOW (ref 3.5–5.1)
Sodium: 139 mmol/L (ref 135–145)

## 2019-08-22 LAB — I-STAT BETA HCG BLOOD, ED (MC, WL, AP ONLY): I-stat hCG, quantitative: <5 m[IU]/mL (ref <5)

## 2019-08-22 NOTE — ED Triage Notes (Signed)
Patient reports lower back pain radiating to flank and lower abdomen x3 days. Denies urinary symptoms, N/V/D.

## 2019-08-23 ENCOUNTER — Emergency Department (HOSPITAL_COMMUNITY): Payer: Medicaid Other

## 2019-08-23 ENCOUNTER — Telehealth: Payer: Self-pay

## 2019-08-23 LAB — URINE CULTURE
Culture: <10000 — AB
Special Requests: NORMAL

## 2019-08-23 MED ORDER — HYDROCODONE-ACETAMINOPHEN 5-325 MG PO TABS: 1.0000 | ORAL_TABLET | Freq: Four times a day (QID) | ORAL | 0 refills | Status: DC | PRN

## 2019-08-23 MED ORDER — HYDROCODONE-ACETAMINOPHEN 5-325 MG PO TABS
2.0000 | ORAL_TABLET | Freq: Once | ORAL | Status: AC
  Administered 2019-08-23: 2 via ORAL
  Filled 2019-08-23: qty 2

## 2019-08-23 NOTE — ED Provider Notes (Signed)
[place] DEPT Provider Note   CSN: [id number] Arrival date & time: 08/22/19  2056     History Chief Complaint  Patient presents with  . Back Pain  . Flank Pain  . Abdominal Pain    Teresa [name] is a 49 y.o. female.  Patient is a 49 year old female with history of ovarian cyst, fibroids, hypertension.  She presents today for evaluation of left flank pain.  This radiates into the left lower quadrant and has been present for the past 3 days.  She denies any bowel or urinary complaints.  She denies any fevers or chills.  The history is provided by the patient.  Flank Pain This is a new problem. Episode onset: 3 days ago. The problem occurs constantly. The problem has been gradually worsening. Nothing aggravates the symptoms. Nothing relieves the symptoms. Treatments tried: Tylenol. The treatment provided mild relief.       Past Medical History:  Diagnosis Date  . Anemia    iron infusions x 2  . Anxiety   . Arthritis    Neck, left shoulder  . Asthma    uses inhaler 2-3 x week  . Bipolar disorder ([place])   . Depression   . Endometriosis   . Fibroids   . GERD (gastroesophageal reflux disease)   . Headache    Migraines  . Hepatitis 2009   Hep C  . HIV infection ([place])   . Hypertension   . Pancreatitis   . Seasonal allergies   . SVD (spontaneous vaginal delivery)    x 1    Patient Active Problem List   Diagnosis Date Noted  . Hypertensive urgency 05/24/2019  . Asthma   . Depression with anxiety   . Pelvic pain in female 05/16/2019  . Arthralgia 04/02/2019  . HTN, goal below 130/80 06/05/2018  . Iron deficiency anemia due to chronic blood loss 03/25/2014  . Abnormal uterine bleeding (AUB) 03/12/2014  . Bipolar affective disorder ([place]) 04/13/2012  . Endometriosis 04/13/2012  . Migraine headache 04/13/2012  . Hepatitis C infection 03/10/2011  . HIV disease ([place]) 03/10/2011    Past Surgical History:  Procedure Laterality Date   . ABDOMINAL HYSTERECTOMY    . CESAREAN SECTION     x2  . DILATION AND CURETTAGE OF UTERUS     x 2 - TAB  . HAND SURGERY Right    2 pins in wrist  . HYSTEROSCOPY N/A 10/11/2016   Procedure: HYSTEROSCOPY WITH HYDROTHERMAL ABLATION;  Surgeon: Donnamae Jude, MD;  Location: [facility];  Service: Gynecology;  Laterality: N/A;  Caryl Pina the [employer]TA rep will be here.  Confirmed on 09/02/16.   Marland Kitchen LAPAROSCOPIC BILATERAL SALPINGECTOMY Bilateral 07/11/2018   Procedure: LAPAROSCOPIC BILATERAL SALPINGECTOMY;  Surgeon: Ward, Honor Loh, MD;  Location: [facility];  Service: Gynecology;  Laterality: Bilateral;  . LAPAROSCOPIC HYSTERECTOMY N/A 07/11/2018   Procedure: HYSTERECTOMY TOTAL LAPAROSCOPIC;  Surgeon: Ward, Honor Loh, MD;  Location: [facility];  Service: Gynecology;  Laterality: N/A;  . LAPAROSCOPIC TUBAL LIGATION Bilateral 10/11/2016   Procedure: LAPAROSCOPIC TUBAL LIGATION WITH FILSHIE CLIPS;  Surgeon: Donnamae Jude, MD;  Location: [facility];  Service: Gynecology;  Laterality: Bilateral;  . MOUTH SURGERY    . ROBOTIC ASSISTED LAPAROSCOPIC OVARIAN CYSTECTOMY Left 05/04/2019   Procedure: XI ROBOTIC ASSISTED LAPAROSCOPIC OVARIAN CYSTECTOMY;  Surgeon: Ward, Honor Loh, MD;  Location: [facility];  Service: Gynecology;  Laterality: Left;  . UMBILICAL HERNIA REPAIR N/A 07/11/2018   Procedure: LAPAROSCOPIC UMBILICAL HERNIA REPAIR;  Surgeon:  Lysle Pearl, Isami, DO;  Location: [facility];  Service: General;  Laterality: N/A;  . WISDOM TOOTH EXTRACTION       OB History    Gravida  5   Para  3   Term  3   Preterm      AB  2   Living        SAB      TAB      Ectopic      Multiple      Live Births  3           Family History  Problem Relation Age of Onset  . Breast cancer Paternal Grandmother   . Breast cancer Cousin     Social History   Tobacco Use  . Smoking status: Former Smoker    Packs/day: 0.25    Years: 25.00    Pack years: 6.25    Types: Cigarettes  . Smokeless tobacco: Never Used  . Tobacco  comment: uses vapor occasionally  Substance Use Topics  . Alcohol use: Yes    Comment: wine occ  . Drug use: Yes    Types: Marijuana    Comment: 2-3 x per month    Home Medications Prior to Admission medications   Medication Sig Start Date End Date Taking? Authorizing Provider  abacavir-dolutegravir-lamiVUDine (TRIUMEQ) 600-50-300 MG tablet Take 1 tablet by mouth daily. 06/20/19   Tsosie Billing, MD  albuterol (PROAIR HFA) 108 (90 Base) MCG/ACT inhaler Inhale 2 puffs into the lungs every 6 (six) hours as needed for wheezing or shortness of breath.     [provider]  amLODipine (NORVASC) 10 MG tablet as directed 05/26/19   [provider]  buPROPion (WELLBUTRIN) 75 MG tablet Take 150 mg by mouth daily.     [provider]  butalbital-acetaminophen-caffeine (FIORICET) 50-325-40 MG tablet Take 1 tablet by mouth every 4 (four) hours as needed for headache or migraine. Patient not taking: Reported on 06/08/2019 05/26/19   Ralene Muskrat B, MD  diclofenac Sodium (VOLTAREN) 1 % GEL Apply 2 g topically 4 (four) times daily.  04/02/19   [provider]  Galcanezumab-gnlm (EMGALITY) 120 MG/ML SOSY Inject 120 mg into the skin every 28 (twenty-eight) days.    [provider]  Glycopyrronium Tosylate 2.4 % PADS Apply topically. 06/06/19   [provider]  hydrochlorothiazide (HYDRODIURIL) 25 MG tablet Take 1 tablet (25 mg total) by mouth daily. 05/26/19 06/25/19  Sidney Ace, MD  lisinopril (ZESTRIL) 40 MG tablet Take 1 tablet (40 mg total) by mouth daily. 05/26/19 06/25/19  Sidney Ace, MD  metoprolol tartrate (LOPRESSOR) 25 MG tablet Take 1 tablet (25 mg total) by mouth 2 (two) times daily. 05/27/19 06/26/19  Sidney Ace, MD  pantoprazole (PROTONIX) 40 MG tablet Take 40 mg by mouth daily.    [provider]  sertraline (ZOLOFT) 100 MG tablet Take 150 mg by mouth at bedtime.     [provider]  SUMAtriptan  (IMITREX) 50 MG tablet Take 1 tablet (50 mg total) by mouth every 2 (two) hours as needed for migraine or headache. May repeat in 2 hours if headache persists or recurs. 05/26/19   Sidney Ace, MD  verapamil (CALAN) 80 MG tablet Take 1 tablet (80 mg total) by mouth 3 (three) times daily. 05/27/19 06/26/19  Sidney Ace, MD    Allergies    Naproxen sodium  Review of Systems   Review of Systems  Genitourinary: Positive for flank  pain.  All other systems reviewed and are negative.   Physical Exam Updated Vital Signs BP (!) 164/101   Pulse 72   Temp 98.8 F (37.1 C) (Oral)   Resp 16   Ht 5\' 7"  (1.702 m)   Wt 78.6 kg   LMP 07/10/2018 (Exact Date)   SpO2 100%   BMI 27.14 kg/m   Physical Exam Vitals and nursing note reviewed.  Constitutional:      General: She is not in acute distress.    Appearance: She is well-developed. She is not diaphoretic.  HENT:     Head: Normocephalic and atraumatic.  Cardiovascular:     Rate and Rhythm: Normal rate and regular rhythm.     Heart sounds: No murmur. No friction rub. No gallop.   Pulmonary:     Effort: Pulmonary effort is normal. No respiratory distress.     Breath sounds: Normal breath sounds. No wheezing.  Abdominal:     General: Bowel sounds are normal. There is no distension.     Palpations: Abdomen is soft.     Tenderness: There is abdominal tenderness in the left lower quadrant. There is left CVA tenderness. There is no right CVA tenderness, guarding or rebound.  Musculoskeletal:        General: Normal range of motion.     Cervical back: Normal range of motion and neck supple.  Skin:    General: Skin is warm and dry.  Neurological:     Mental Status: She is alert and oriented to person, place, and time.     ED Results / Procedures / Treatments   Labs (all labs ordered are listed, but only abnormal results are displayed) Labs Reviewed  URINALYSIS, ROUTINE W REFLEX MICROSCOPIC - Abnormal; Notable for the  following components:      Result Value   Hgb urine dipstick SMALL (*)    Bacteria, UA RARE (*)    All other components within normal limits  BASIC METABOLIC PANEL - Abnormal; Notable for the following components:   Potassium 3.4 (*)    Glucose, Bld 122 (*)    Calcium 8.7 (*)    All other components within normal limits  CBC - Abnormal; Notable for the following components:   WBC 3.0 (*)    All other components within normal limits  URINE CULTURE  I-STAT BETA HCG BLOOD, ED ([facility], [facility], [facility] ONLY)    EKG None  Radiology CT Renal Stone Study  Result Date: 08/23/2019 CLINICAL DATA:  Lower abdominal pain radiating to her left flank EXAM: CT ABDOMEN AND PELVIS WITHOUT CONTRAST TECHNIQUE: Multidetector CT imaging of the abdomen and pelvis was performed following the standard protocol without IV contrast. COMPARISON:  May 06, 2019 FINDINGS: Lower chest: The visualized heart size within normal limits. No pericardial fluid/thickening. No hiatal hernia. The visualized portions of the lungs are clear. Hepatobiliary: Although limited due to the lack of intravenous contrast, normal in appearance without gross focal abnormality. No evidence of calcified gallstones or biliary ductal dilatation. Pancreas:  Unremarkable.  No surrounding inflammatory changes. Spleen: Normal in size. Although limited due to the lack of intravenous contrast, normal in appearance. Adrenals/Urinary Tract: Both adrenal glands appear normal. The kidneys and collecting system appear normal without evidence of urinary tract calculus or hydronephrosis. Bladder is unremarkable. Stomach/Bowel: The stomach, small bowel, and colon are normal in appearance. No inflammatory changes or obstructive findings. appendix is normal. Vascular/Lymphatic: There are no enlarged abdominal or pelvic lymph nodes. Minimal scattered aortic atherosclerosis seen  in the distal abdominal aorta. Reproductive: Tubal ligation clips seen within the left lower pelvis.  There is multilocular cystic appearance to the left ovary with the largest cyst measuring 3.5 cm. There is a small to moderate amount of fluid seen within the deep pelvis. Other: No evidence of abdominal wall mass or hernia. Musculoskeletal: No acute or significant osseous findings. IMPRESSION: No renal or collecting system calculi.  No hydronephrosis. Multilocular cysts seen within the left ovary the largest measuring 3.5 cm. Small to moderate fluid within the deep pelvis which could be from recently ruptured cyst. Aortic Atherosclerosis (ICD10-I70.0). Electronically Signed   By: Prudencio Pair M.D.   On: 08/23/2019 00:49    Procedures Procedures (including critical care time)  Medications Ordered in ED Medications  HYDROcodone-acetaminophen (NORCO/VICODIN) 5-325 MG per tablet 2 tablet (2 tablets Oral Given 08/23/19 0034)    ED Course  I have reviewed the triage vital signs and the nursing notes.  Pertinent labs & imaging results that were available during my care of the patient were reviewed by me and considered in my medical decision making (see chart for details).    MDM Rules/Calculators/A&P  CT scan shows a left ovarian cyst with free fluid in the pelvis consistent with a ruptured ovarian cyst.  Laboratory studies are reassuring and she is feeling better after receiving pain medicine in the ER.    Patient to be discharged with pain medication and follow-up with her GYN.  Final Clinical Impression(s) / ED Diagnoses Final diagnoses:  None    Rx / DC Orders ED Discharge Orders    None       Veryl Speak, MD 08/23/19 [phone]

## 2019-08-23 NOTE — Telephone Encounter (Signed)
Spoke to patient and advised I did speak to [facility] weeks ago. Gave her their number.

## 2019-08-23 NOTE — Telephone Encounter (Signed)
Patient called about missing Rx prescribed through ER. She hung up befor I could talk/ advise her.

## 2019-08-23 NOTE — ED Notes (Signed)
Pt transported to CT ?

## 2019-08-23 NOTE — ED Notes (Signed)
Pt a/o x4 and ambulatory at discharge. PT husband picked pt up and pt educated not to drive while taking pain medication.

## 2019-08-23 NOTE — Discharge Instructions (Signed)
Take hydrocodone as prescribed as needed for pain.  Follow-up with your GYN if symptoms or not improving in the next few days, and return to the ER if symptoms significantly worsen or change.

## 2019-08-31 ENCOUNTER — Other Ambulatory Visit: Payer: Self-pay

## 2019-08-31 DIAGNOSIS — Z79899 Other long term (current) drug therapy: Secondary | ICD-10-CM

## 2019-08-31 DIAGNOSIS — Z23 Encounter for immunization: Secondary | ICD-10-CM

## 2019-08-31 DIAGNOSIS — Z113 Encounter for screening for infections with a predominantly sexual mode of transmission: Secondary | ICD-10-CM

## 2019-08-31 DIAGNOSIS — B20 Human immunodeficiency virus [HIV] disease: Secondary | ICD-10-CM

## 2019-09-04 ENCOUNTER — Other Ambulatory Visit: Payer: Medicaid Other

## 2019-09-04 ENCOUNTER — Other Ambulatory Visit: Payer: Self-pay

## 2019-09-04 DIAGNOSIS — Z113 Encounter for screening for infections with a predominantly sexual mode of transmission: Secondary | ICD-10-CM

## 2019-09-04 DIAGNOSIS — B20 Human immunodeficiency virus [HIV] disease: Secondary | ICD-10-CM

## 2019-09-04 DIAGNOSIS — Z79899 Other long term (current) drug therapy: Secondary | ICD-10-CM

## 2019-09-05 LAB — T-HELPER CELL (CD4) - (RCID CLINIC ONLY)
CD4 % Helper T Cell: 50 % (ref 33–65)
CD4 T Cell Abs: 556 /uL (ref 400–1790)

## 2019-09-07 LAB — COMPREHENSIVE METABOLIC PANEL
AG Ratio: 1.2 (calc) (ref 1.0–2.5)
ALT: 95 U/L — ABNORMAL HIGH (ref 6–29)
AST: 209 U/L — ABNORMAL HIGH (ref 10–35)
Albumin: 3.8 g/dL (ref 3.6–5.1)
Alkaline phosphatase (APISO): 139 U/L — ABNORMAL HIGH (ref 31–125)
BUN: 9 mg/dL (ref 7–25)
CO2: 28 mmol/L (ref 20–32)
Calcium: 9.3 mg/dL (ref 8.6–10.2)
Chloride: 103 mmol/L (ref 98–110)
Creat: 0.84 mg/dL (ref 0.50–1.10)
Globulin: 3.2 g/dL (calc) (ref 1.9–3.7)
Glucose, Bld: 139 mg/dL — ABNORMAL HIGH (ref 65–99)
Potassium: 3.8 mmol/L (ref 3.5–5.3)
Sodium: 139 mmol/L (ref 135–146)
Total Bilirubin: 1.1 mg/dL (ref 0.2–1.2)
Total Protein: 7 g/dL (ref 6.1–8.1)

## 2019-09-07 LAB — CBC WITH DIFFERENTIAL/PLATELET
Absolute Monocytes: 180 cells/uL — ABNORMAL LOW (ref 200–950)
Basophils Absolute: 31 cells/uL (ref 0–200)
Basophils Relative: 1.3 %
Eosinophils Absolute: 31 cells/uL (ref 15–500)
Eosinophils Relative: 1.3 %
HCT: 40.9 % (ref 35.0–45.0)
Hemoglobin: 13.6 g/dL (ref 11.7–15.5)
Lymphs Abs: 1135 cells/uL (ref 850–3900)
MCH: 30.2 pg (ref 27.0–33.0)
MCHC: 33.3 g/dL (ref 32.0–36.0)
MCV: 90.9 fL (ref 80.0–100.0)
MPV: 12.2 fL (ref 7.5–12.5)
Monocytes Relative: 7.5 %
Neutro Abs: 1022 cells/uL — ABNORMAL LOW (ref 1500–7800)
Neutrophils Relative %: 42.6 %
Platelets: 178 10*3/uL (ref 140–400)
RBC: 4.5 10*6/uL (ref 3.80–5.10)
RDW: 12.6 % (ref 11.0–15.0)
Total Lymphocyte: 47.3 %
WBC: 2.4 10*3/uL — ABNORMAL LOW (ref 3.8–10.8)

## 2019-09-07 LAB — LIPID PANEL
Cholesterol: 138 mg/dL (ref <200)
HDL: 50 mg/dL (ref >=50)
LDL Cholesterol (Calc): 70 mg/dL (calc)
Non-HDL Cholesterol (Calc): 88 mg/dL (calc) (ref <130)
Total CHOL/HDL Ratio: 2.8 (calc) (ref <5.0)
Triglycerides: 94 mg/dL (ref <150)

## 2019-09-07 LAB — HIV-1 RNA QUANT-NO REFLEX-BLD
HIV 1 RNA Quant: <20 copies/mL
HIV-1 RNA Quant, Log: <1.3 Log copies/mL

## 2019-09-07 LAB — RPR: RPR Ser Ql: NONREACTIVE

## 2019-09-18 ENCOUNTER — Ambulatory Visit (INDEPENDENT_AMBULATORY_CARE_PROVIDER_SITE_OTHER): Payer: Medicaid Other | Admitting: Infectious Disease

## 2019-09-18 ENCOUNTER — Encounter: Payer: Self-pay | Admitting: Infectious Disease

## 2019-09-18 ENCOUNTER — Other Ambulatory Visit: Payer: Self-pay

## 2019-09-18 VITALS — BP 171/96 | HR 69 | Temp 97.9°F | Wt 167.0 lb

## 2019-09-18 DIAGNOSIS — F320 Major depressive disorder, single episode, mild: Secondary | ICD-10-CM

## 2019-09-18 DIAGNOSIS — B20 Human immunodeficiency virus [HIV] disease: Secondary | ICD-10-CM

## 2019-09-18 DIAGNOSIS — R21 Rash and other nonspecific skin eruption: Secondary | ICD-10-CM | POA: Insufficient documentation

## 2019-09-18 HISTORY — DX: Rash and other nonspecific skin eruption: R21

## 2019-09-18 MED ORDER — TRIAMCINOLONE ACETONIDE 0.5 % EX OINT
1.0000 | TOPICAL_OINTMENT | Freq: Two times a day (BID) | CUTANEOUS | 0 refills | Status: DC
Start: 2019-09-18 — End: 2019-11-07

## 2019-09-18 MED ORDER — DOVATO 50-300 MG PO TABS: 1.0000 | ORAL_TABLET | Freq: Every day | ORAL | 11 refills | Status: DC

## 2019-09-18 NOTE — Progress Notes (Signed)
Subjective:  Chief complaint some pruritus on her arms and depressive symptoms  Patient ID: Teresa [name], female    DOB: 11-04-1970, 49 y.o.   MRN: [id number]  HPI  Teresa [name] is a 49 year old African-American lady living with HIV who was originally cared for at [facility] where she says she was on Isentress twice daily and Truvada.  She has been in the care of Dr. Tama High at [facility] and been on Triumeq with perfectly suppressed viral load and healthy CD4 count.  She believes she acquired HIV from her first husband who father child with her.  She was with him for 15 years.  He is adamantly denying that he gave her HIV though according to his story it sounds like it very likely did.  She is now with his second husband who knows about her HIV status but is not very open to talking about it.  She is suffering from a fair amount of depressive symptoms related to her HIV status and lack of support she is getting from her current husband.  She also been in the process of moving.  She has some itchiness on both arms bilaterally which has been going on for a few weeks.    Past Medical History:  Diagnosis Date  . Anemia    iron infusions x 2  . Anxiety   . Arthritis    Neck, left shoulder  . Asthma    uses inhaler 2-3 x week  . Bipolar disorder ([place])   . Depression   . Endometriosis   . Fibroids   . GERD (gastroesophageal reflux disease)   . Headache    Migraines  . Hepatitis 2009   Hep C  . HIV infection ([place])   . Hypertension   . Pancreatitis   . Seasonal allergies   . SVD (spontaneous vaginal delivery)    x 1    Past Surgical History:  Procedure Laterality Date  . ABDOMINAL HYSTERECTOMY    . CESAREAN SECTION     x2  . DILATION AND CURETTAGE OF UTERUS     x 2 - TAB  . HAND SURGERY Right    2 pins in wrist  . HYSTEROSCOPY N/A 10/11/2016   Procedure: HYSTEROSCOPY WITH HYDROTHERMAL ABLATION;  Surgeon: Donnamae Jude, MD;  Location: [facility];   Service: Gynecology;  Laterality: N/A;  Caryl Pina the HTA rep will be here.  Confirmed on 09/02/16.   Marland Kitchen LAPAROSCOPIC BILATERAL SALPINGECTOMY Bilateral 07/11/2018   Procedure: LAPAROSCOPIC BILATERAL SALPINGECTOMY;  Surgeon: Ward, Honor Loh, MD;  Location: [facility];  Service: Gynecology;  Laterality: Bilateral;  . LAPAROSCOPIC HYSTERECTOMY N/A 07/11/2018   Procedure: HYSTERECTOMY TOTAL LAPAROSCOPIC;  Surgeon: Ward, Honor Loh, MD;  Location: [facility];  Service: Gynecology;  Laterality: N/A;  . LAPAROSCOPIC TUBAL LIGATION Bilateral 10/11/2016   Procedure: LAPAROSCOPIC TUBAL LIGATION WITH FILSHIE CLIPS;  Surgeon: Donnamae Jude, MD;  Location: [facility];  Service: Gynecology;  Laterality: Bilateral;  . MOUTH SURGERY    . ROBOTIC ASSISTED LAPAROSCOPIC OVARIAN CYSTECTOMY Left 05/04/2019   Procedure: XI ROBOTIC ASSISTED LAPAROSCOPIC OVARIAN CYSTECTOMY;  Surgeon: Ward, Honor Loh, MD;  Location: [facility];  Service: Gynecology;  Laterality: Left;  . UMBILICAL HERNIA REPAIR N/A 07/11/2018   Procedure: LAPAROSCOPIC UMBILICAL HERNIA REPAIR;  Surgeon: Benjamine Sprague, DO;  Location: [facility];  Service: General;  Laterality: N/A;  . WISDOM TOOTH EXTRACTION      Family History  Problem Relation Age of Onset  . Breast cancer Paternal  Grandmother   . Breast cancer Cousin       Social History   Socioeconomic History  . Marital status: Legally Separated    Spouse name: Not on file  . Number of children: Not on file  . Years of education: Not on file  . Highest education level: Not on file  Occupational History  . Not on file  Tobacco Use  . Smoking status: Former Smoker    Packs/day: 0.25    Years: 25.00    Pack years: 6.25    Types: Cigarettes  . Smokeless tobacco: Never Used  . Tobacco comment: uses vapor occasionally  Substance and Sexual Activity  . Alcohol use: Yes    Comment: wine occ  . Drug use: Yes    Types: Marijuana    Comment: 2-3 x per month  . Sexual activity: Not Currently  Other Topics  Concern  . Not on file  Social History Narrative  . Not on file   Social Determinants of Health   Financial Resource Strain:   . Difficulty of Paying Living Expenses:   Food Insecurity:   . Worried About Charity fundraiser in the Last Year:   . Arboriculturist in the Last Year:   Transportation Needs:   . Film/video editor (Medical):   Marland Kitchen Lack of Transportation (Non-Medical):   Physical Activity:   . Days of Exercise per Week:   . Minutes of Exercise per Session:   Stress:   . Feeling of Stress :   Social Connections:   . Frequency of Communication with Friends and Family:   . Frequency of Social Gatherings with Friends and Family:   . Attends Religious Services:   . Active Member of Clubs or Organizations:   . Attends Archivist Meetings:   Marland Kitchen Marital Status:     Allergies  Allergen Reactions  . Naproxen Sodium Hives    Can take ibuprofen without causing any hives     Current Outpatient Medications:  .  abacavir-dolutegravir-lamiVUDine (TRIUMEQ) 600-50-300 MG tablet, Take 1 tablet by mouth daily., Disp: 30 tablet, Rfl: 3 .  albuterol (PROAIR HFA) 108 (90 Base) MCG/ACT inhaler, Inhale 2 puffs into the lungs every 6 (six) hours as needed for wheezing or shortness of breath. , Disp: , Rfl:  .  buPROPion (WELLBUTRIN) 75 MG tablet, Take 150 mg by mouth daily. , Disp: , Rfl:  .  butalbital-acetaminophen-caffeine (FIORICET) 50-325-40 MG tablet, Take 1 tablet by mouth every 4 (four) hours as needed for headache or migraine., Disp: 14 tablet, Rfl: 0 .  diclofenac Sodium (VOLTAREN) 1 % GEL, Apply 2 g topically 4 (four) times daily. , Disp: , Rfl:  .  Galcanezumab-gnlm (EMGALITY) 120 MG/ML SOSY, Inject 120 mg into the skin every 28 (twenty-eight) days., Disp: , Rfl:  .  Glycopyrronium Tosylate 2.4 % PADS, Apply topically., Disp: , Rfl:  .  HYDROcodone-acetaminophen (NORCO) 5-325 MG tablet, Take 1-2 tablets by mouth every 6 (six) hours as needed., Disp: 15 tablet, Rfl:  0 .  pantoprazole (PROTONIX) 40 MG tablet, Take 40 mg by mouth daily., Disp: , Rfl:  .  sertraline (ZOLOFT) 100 MG tablet, Take 150 mg by mouth at bedtime. , Disp: , Rfl:  .  SUMAtriptan (IMITREX) 50 MG tablet, Take 1 tablet (50 mg total) by mouth every 2 (two) hours as needed for migraine or headache. May repeat in 2 hours if headache persists or recurs., Disp: 10 tablet, Rfl: 5 .  amLODipine (  NORVASC) 10 MG tablet, as directed, Disp: , Rfl:  .  hydrochlorothiazide (HYDRODIURIL) 25 MG tablet, Take 1 tablet (25 mg total) by mouth daily., Disp: 30 tablet, Rfl: 0 .  lisinopril (ZESTRIL) 40 MG tablet, Take 1 tablet (40 mg total) by mouth daily., Disp: 30 tablet, Rfl: 0 .  metoprolol tartrate (LOPRESSOR) 25 MG tablet, Take 1 tablet (25 mg total) by mouth 2 (two) times daily., Disp: 60 tablet, Rfl: 0 .  verapamil (CALAN) 80 MG tablet, Take 1 tablet (80 mg total) by mouth 3 (three) times daily., Disp: 90 tablet, Rfl: 0    Review of Systems  Constitutional: Negative for chills and fever.  HENT: Negative for congestion and sore throat.   Eyes: Negative for photophobia.  Respiratory: Negative for cough, shortness of breath and wheezing.   Cardiovascular: Negative for chest pain, palpitations and leg swelling.  Gastrointestinal: Negative for abdominal pain, blood in stool, constipation, diarrhea, nausea and vomiting.  Genitourinary: Negative for dysuria, flank pain and hematuria.  Musculoskeletal: Negative for back pain and myalgias.  Skin: Negative for rash.  Neurological: Negative for dizziness, weakness and headaches.  Hematological: Does not bruise/bleed easily.  Psychiatric/Behavioral: Negative for suicidal ideas.       Objective:   Physical Exam Constitutional:      General: She is not in acute distress.    Appearance: She is well-developed. She is not diaphoretic.  HENT:     Head: Normocephalic and atraumatic.     Mouth/Throat:     Pharynx: No oropharyngeal exudate.  Eyes:      General: No scleral icterus.    Conjunctiva/sclera: Conjunctivae normal.  Cardiovascular:     Rate and Rhythm: Normal rate and regular rhythm.  Pulmonary:     Effort: Pulmonary effort is normal. No respiratory distress.     Breath sounds: No wheezing.  Abdominal:     General: There is no distension.  Musculoskeletal:        General: No tenderness.     Cervical back: Normal range of motion and neck supple.  Skin:    General: Skin is warm and dry.     Coloration: Skin is not pale.     Findings: No erythema or rash.  Neurological:     Mental Status: She is alert and oriented to person, place, and time.     Motor: No abnormal muscle tone.     Coordination: Coordination normal.  Psychiatric:        Attention and Perception: Attention and perception normal.        Mood and Affect: Mood is depressed.        Speech: Speech normal.        Behavior: Behavior normal.        Thought Content: Thought content normal.        Cognition and Memory: Cognition and memory normal.        Judgment: Judgment normal.           Assessment & Plan:  HIV disease: Simplify from Triumeq to Dovato to get the abacavir out of her regimen and recheck labs in 2 months  Depressive symptoms: We will have her get connected to Marcie Bal she has tried trazodone she is also the process of seeing a PCP.  Her therapist she worked with recently did not work well for her I encouraged her to make sure that she shops around with appropriate fit  Pruritus we will try triamcinolone topically.

## 2019-09-26 ENCOUNTER — Encounter: Payer: Self-pay | Admitting: Infectious Disease

## 2019-11-07 ENCOUNTER — Other Ambulatory Visit: Payer: Self-pay | Admitting: Infectious Disease

## 2019-11-14 ENCOUNTER — Telehealth: Payer: Self-pay | Admitting: Family Medicine

## 2019-11-14 NOTE — Telephone Encounter (Signed)
Done

## 2019-11-21 ENCOUNTER — Ambulatory Visit: Payer: Disability Insurance | Admitting: Family Medicine

## 2019-11-30 ENCOUNTER — Ambulatory Visit (INDEPENDENT_AMBULATORY_CARE_PROVIDER_SITE_OTHER): Payer: Medicaid Other | Admitting: Family Medicine

## 2019-11-30 ENCOUNTER — Other Ambulatory Visit: Payer: Self-pay

## 2019-11-30 ENCOUNTER — Encounter: Payer: Self-pay | Admitting: Family Medicine

## 2019-11-30 VITALS — BP 94/55 | HR 60 | Temp 97.8°F | Ht 67.0 in | Wt 161.0 lb

## 2019-11-30 DIAGNOSIS — F319 Bipolar disorder, unspecified: Secondary | ICD-10-CM

## 2019-11-30 DIAGNOSIS — B20 Human immunodeficiency virus [HIV] disease: Secondary | ICD-10-CM

## 2019-11-30 DIAGNOSIS — Z0000 Encounter for general adult medical examination without abnormal findings: Secondary | ICD-10-CM

## 2019-11-30 DIAGNOSIS — G43909 Migraine, unspecified, not intractable, without status migrainosus: Secondary | ICD-10-CM | POA: Diagnosis not present

## 2019-11-30 DIAGNOSIS — D509 Iron deficiency anemia, unspecified: Secondary | ICD-10-CM

## 2019-11-30 DIAGNOSIS — J45909 Unspecified asthma, uncomplicated: Secondary | ICD-10-CM

## 2019-11-30 DIAGNOSIS — G4700 Insomnia, unspecified: Secondary | ICD-10-CM | POA: Diagnosis not present

## 2019-11-30 DIAGNOSIS — I10 Essential (primary) hypertension: Secondary | ICD-10-CM | POA: Diagnosis not present

## 2019-11-30 DIAGNOSIS — D649 Anemia, unspecified: Secondary | ICD-10-CM

## 2019-11-30 DIAGNOSIS — Z09 Encounter for follow-up examination after completed treatment for conditions other than malignant neoplasm: Secondary | ICD-10-CM

## 2019-11-30 DIAGNOSIS — Z21 Asymptomatic human immunodeficiency virus [HIV] infection status: Secondary | ICD-10-CM

## 2019-11-30 LAB — POCT URINALYSIS DIPSTICK
Bilirubin, UA: NEGATIVE
Blood, UA: NEGATIVE
Glucose, UA: NEGATIVE
Ketones, UA: NEGATIVE
Leukocytes, UA: NEGATIVE
Nitrite, UA: NEGATIVE
Protein, UA: NEGATIVE
Spec Grav, UA: 1.01
Urobilinogen, UA: 0.2 U/dL
pH, UA: 7

## 2019-11-30 NOTE — Progress Notes (Signed)
[facility]   New Patient--Establish Care  Subjective:  Patient ID: MARY-ANN PENNELLA, female    DOB: August 15, 1970  Age: 49 y.o. MRN: [id number]  CC:  Chief Complaint  Patient presents with  . Hypertension  . Establish Care    HPI MYEASHA BALLOWE is a 49 year old female who presents for Follow Up today.    Patient Active Problem List   Diagnosis Date Noted  . Rash 09/18/2019  . Hypertensive urgency 05/24/2019  . Asthma   . Depression   . Pelvic pain in female 05/16/2019  . Arthralgia 04/02/2019  . HTN, goal below 130/80 06/05/2018  . Iron deficiency anemia due to chronic blood loss 03/25/2014  . Abnormal uterine bleeding (AUB) 03/12/2014  . Bipolar affective disorder ([place]) 04/13/2012  . Endometriosis 04/13/2012  . Migraine headache 04/13/2012  . Hepatitis C infection 03/10/2011  . HIV disease ([place]) 03/10/2011    Past Medical History:  Diagnosis Date  . Anemia    iron infusions x 2  . Anxiety   . Arthritis    Neck, left shoulder  . Asthma    uses inhaler 2-3 x week  . Bipolar disorder ([place])   . Depression   . Endometriosis   . Fibroids   . GERD (gastroesophageal reflux disease)   . Headache    Migraines  . Hepatitis 2009   Hep C  . HIV infection ([place])   . Hypertension   . Pancreatitis   . Rash 09/18/2019  . Seasonal allergies   . SVD (spontaneous vaginal delivery)    x 1   Current Status: This will be Ms Leppo's initial office visit with me. She was previously seeing Dr. Edwina Barth in [place] at [facility] for her PCP needs. She has recently relocated and now lives in [place]. Since her last office visit, she is doing well with no complaints. She denies visual changes, chest pain, cough, shortness of breath, heart palpitations, and falls. She has occasional headaches and dizziness with position changes. Denies severe headaches, confusion, seizures, double vision, and blurred vision, nausea and vomiting.  She is currently follow up with Psychiatrist every 2 weeks. She denies fevers, chills, fatigue, recent infections, weight loss, and night sweats. She has not had any headaches, visual changes, dizziness, and falls. No chest pain, heart palpitations, cough and shortness of breath reported. No reports of GI problems such as diarrhea, and constipation. She has no reports of blood in stools, dysuria and hematuria. Her anxiety is stable today. She is currently following up with Psychiatry as needed. She is taking all medications as prescribed. She denies pain today.   Past Surgical History:  Procedure Laterality Date  . ABDOMINAL HYSTERECTOMY    . CESAREAN SECTION     x2  . DILATION AND CURETTAGE OF UTERUS     x 2 - TAB  . HAND SURGERY Right    2 pins in wrist  . HYSTEROSCOPY N/A 10/11/2016   Procedure: HYSTEROSCOPY WITH HYDROTHERMAL ABLATION;  Surgeon: Donnamae Jude, MD;  Location: [facility];  Service: Gynecology;  Laterality: N/A;  Caryl Pina the HTA rep will be here.  Confirmed on 09/02/16.   Marland Kitchen LAPAROSCOPIC BILATERAL SALPINGECTOMY Bilateral 07/11/2018   Procedure: LAPAROSCOPIC BILATERAL SALPINGECTOMY;  Surgeon: Ward, Honor Loh, MD;  Location: [facility];  Service: Gynecology;  Laterality: Bilateral;  . LAPAROSCOPIC HYSTERECTOMY N/A 07/11/2018   Procedure: HYSTERECTOMY TOTAL LAPAROSCOPIC;  Surgeon: Ward, Honor Loh, MD;  Location: [facility];  Service: Gynecology;  Laterality: N/A;  . LAPAROSCOPIC TUBAL LIGATION Bilateral 10/11/2016   Procedure: LAPAROSCOPIC TUBAL LIGATION WITH FILSHIE CLIPS;  Surgeon: Donnamae Jude, MD;  Location: [facility];  Service: Gynecology;  Laterality: Bilateral;  . MOUTH SURGERY    . ROBOTIC ASSISTED LAPAROSCOPIC OVARIAN CYSTECTOMY Left 05/04/2019   Procedure: XI ROBOTIC ASSISTED LAPAROSCOPIC OVARIAN CYSTECTOMY;  Surgeon: Ward, Honor Loh, MD;  Location: [facility];  Service: Gynecology;  Laterality: Left;  . UMBILICAL HERNIA REPAIR N/A 07/11/2018   Procedure: LAPAROSCOPIC UMBILICAL HERNIA  REPAIR;  Surgeon: Benjamine Sprague, DO;  Location: [facility];  Service: General;  Laterality: N/A;  . WISDOM TOOTH EXTRACTION      Family History  Problem Relation Age of Onset  . Breast cancer Paternal Grandmother   . Breast cancer Cousin   . Hypertension Mother   . Lung cancer Father   . Diabetes Father     Social History   Socioeconomic History  . Marital status: Legally Separated    Spouse name: Not on file  . Number of children: Not on file  . Years of education: Not on file  . Highest education level: Not on file  Occupational History  . Not on file  Tobacco Use  . Smoking status: Former Smoker    Packs/day: 0.25    Years: 25.00    Pack years: 6.25    Types: Cigarettes  . Smokeless tobacco: Never Used  . Tobacco comment: uses vapor occasionally  Vaping Use  . Vaping Use: Never used  Substance and Sexual Activity  . Alcohol use: Yes    Comment: wine occ  . Drug use: Yes    Types: Marijuana    Comment: 2-3 x per month  . Sexual activity: Not Currently  Other Topics Concern  . Not on file  Social History Narrative  . Not on file   Social Determinants of Health   Financial Resource Strain:   . Difficulty of Paying Living Expenses:   Food Insecurity:   . Worried About Charity fundraiser in the Last Year:   . Arboriculturist in the Last Year:   Transportation Needs:   . Film/video editor (Medical):   Marland Kitchen Lack of Transportation (Non-Medical):   Physical Activity:   . Days of Exercise per Week:   . Minutes of Exercise per Session:   Stress:   . Feeling of Stress :   Social Connections:   . Frequency of Communication with Friends and Family:   . Frequency of Social Gatherings with Friends and Family:   . Attends Religious Services:   . Active Member of Clubs or Organizations:   . Attends Archivist Meetings:   Marland Kitchen Marital Status:   Intimate Partner Violence:   . Fear of Current or Ex-Partner:   . Emotionally Abused:   Marland Kitchen Physically Abused:   .  Sexually Abused:     Outpatient Medications Prior to Visit  Medication Sig Dispense Refill  . albuterol (PROAIR HFA) 108 (90 Base) MCG/ACT inhaler Inhale 2 puffs into the lungs every 6 (six) hours as needed for wheezing or shortness of breath.     Marland Kitchen buPROPion (WELLBUTRIN) 75 MG tablet Take 150 mg by mouth daily.     . diclofenac Sodium (VOLTAREN) 1 % GEL Apply 2 g topically 4 (four) times daily.     . Dolutegravir-lamiVUDine (DOVATO) 50-300 MG TABS Take 1 tablet by mouth daily. 30 tablet 11  . Galcanezumab-gnlm (EMGALITY) 120 MG/ML SOSY Inject  120 mg into the skin every 28 (twenty-eight) days.    . Glycopyrronium Tosylate 2.4 % PADS Apply topically.    . hydrochlorothiazide (HYDRODIURIL) 25 MG tablet Take 1 tablet (25 mg total) by mouth daily. 30 tablet 0  . sertraline (ZOLOFT) 100 MG tablet Take 150 mg by mouth at bedtime.     . SUMAtriptan (IMITREX) 50 MG tablet Take 1 tablet (50 mg total) by mouth every 2 (two) hours as needed for migraine or headache. May repeat in 2 hours if headache persists or recurs. 10 tablet 5  . triamcinolone ointment (KENALOG) 0.5 % APPLY TOPICALLY TO THE AFFECTED AREA TWICE DAILY 30 g 0  . verapamil (CALAN) 80 MG tablet Take 1 tablet (80 mg total) by mouth 3 (three) times daily. 90 tablet 0  . butalbital-acetaminophen-caffeine (FIORICET) 50-325-40 MG tablet Take 1 tablet by mouth every 4 (four) hours as needed for headache or migraine. 14 tablet 0  . pantoprazole (PROTONIX) 40 MG tablet Take 40 mg by mouth daily. (Patient not taking: Reported on 11/30/2019)    . amLODipine (NORVASC) 10 MG tablet as directed    . HYDROcodone-acetaminophen (NORCO) 5-325 MG tablet Take 1-2 tablets by mouth every 6 (six) hours as needed. 15 tablet 0  . lisinopril (ZESTRIL) 40 MG tablet Take 1 tablet (40 mg total) by mouth daily. 30 tablet 0  . metoprolol tartrate (LOPRESSOR) 25 MG tablet Take 1 tablet (25 mg total) by mouth 2 (two) times daily. 60 tablet 0   No facility-administered  medications prior to visit.    Allergies  Allergen Reactions  . Naproxen Sodium Hives    Can take ibuprofen without causing any hives    ROS Review of Systems  Constitutional: Negative.   HENT: Negative.   Eyes: Negative.   Respiratory: Negative.   Cardiovascular: Negative.   Gastrointestinal: Positive for constipation (occasional ).  Endocrine: Negative.   Genitourinary: Negative.   Musculoskeletal: Positive for arthralgias (generalized joint pain ).  Skin: Negative.   Allergic/Immunologic: Negative.   Neurological: Positive for dizziness (occasional ) and headaches (occasional ).  Hematological: Negative.   Psychiatric/Behavioral: Negative.       Objective:    Physical Exam Vitals and nursing note reviewed.  Constitutional:      Appearance: Normal appearance.  HENT:     Head: Normocephalic and atraumatic.     Nose: Nose normal.     Mouth/Throat:     Mouth: Mucous membranes are moist.     Pharynx: Oropharynx is clear.  Cardiovascular:     Rate and Rhythm: Normal rate and regular rhythm.     Pulses: Normal pulses.     Heart sounds: Normal heart sounds.  Pulmonary:     Effort: Pulmonary effort is normal.     Breath sounds: Normal breath sounds.  Abdominal:     General: Bowel sounds are normal.     Palpations: Abdomen is soft.  Musculoskeletal:     Cervical back: Normal range of motion and neck supple.  Skin:    General: Skin is warm and dry.  Neurological:     General: No focal deficit present.     Mental Status: She is alert and oriented to person, place, and time.  Psychiatric:        Mood and Affect: Mood normal.        Thought Content: Thought content normal.        Judgment: Judgment normal.     BP (!) 94/55 (BP Location: Left Arm, Patient  Position: Sitting, Cuff Size: Normal)   Pulse 60   Temp 97.8 F (36.6 C) (Oral)   Ht 5\' 7"  (1.702 m)   Wt 161 lb (73 kg)   LMP 07/10/2018 (Exact Date)   BMI 25.22 kg/m  Wt Readings from Last 3 Encounters:   11/30/19 161 lb (73 kg)  09/18/19 167 lb (75.8 kg)  08/22/19 173 lb 4.8 oz (78.6 kg)     Health Maintenance Due  Topic Date Due  . COVID-19 Vaccine (1) Never done  . PAP SMEAR-Modifier  Never done  . INFLUENZA VACCINE  11/25/2019    There are no preventive care reminders to display for this patient.  Lab Results  Component Value Date   TSH 2.590 11/30/2019   Lab Results  Component Value Date   WBC 3.9 11/30/2019   HGB 13.7 11/30/2019   HCT 39.6 11/30/2019   MCV 86 11/30/2019   PLT 92 (LL) 11/30/2019   Lab Results  Component Value Date   NA 140 11/30/2019   K 3.7 11/30/2019   CO2 23 11/30/2019   GLUCOSE 98 11/30/2019   BUN 8 11/30/2019   CREATININE 0.89 11/30/2019   BILITOT 0.8 11/30/2019   ALKPHOS 157 (H) 11/30/2019   AST 114 (H) 11/30/2019   ALT 80 (H) 11/30/2019   PROT 7.0 11/30/2019   ALBUMIN 3.8 11/30/2019   CALCIUM 9.4 11/30/2019   ANIONGAP 8 08/22/2019   Lab Results  Component Value Date   CHOL 138 09/04/2019   Lab Results  Component Value Date   HDL 50 09/04/2019   Lab Results  Component Value Date   LDLCALC 70 09/04/2019   Lab Results  Component Value Date   TRIG 94 09/04/2019   Lab Results  Component Value Date   CHOLHDL 2.8 09/04/2019   No results found for: HGBA1C    Assessment & Plan:   1. Hypertension, unspecified type The current medical regimen is effective; blood pressure is stable at 99/55 today; continue present plan and medications as prescribed. She will continue to take medications as prescribed, to decrease high sodium intake, excessive alcohol intake, increase potassium intake, smoking cessation, and increase physical activity of at least 30 minutes of cardio activity daily. She will continue to follow Heart Healthy or DASH diet.  2. HTN, goal below 130/80 - Urinalysis Dipstick  3. Migraine without status migrainosus, not intractable, unspecified migraine type Stable.   4. Insomnia, unspecified type  5. Anemia,  unspecified type - CBC with Differential - Iron and TIBC([facility])  6. Iron deficiency anemia, unspecified iron deficiency anemia type - CBC with Differential - Iron and TIBC([facility])  7. HIV infection, unspecified symptom status ([place]) Continue to follow up with Infection Disease as needed.   8. Uncomplicated asthma, unspecified asthma severity, unspecified whether persistent No signs or symptoms of respiratory distress noted or reported today.   9. Bipolar affective disorder, remission status unspecified ([place])  10. Healthcare maintenance - TSH - Comprehensive metabolic panel - Vitamin Q03 - Vitamin D, 25-hydroxy  11. Follow up She will follow up in 3 months.   No orders of the defined types were placed in this encounter.   Orders Placed This Encounter  Procedures  . TSH  . Comprehensive metabolic panel  . Vitamin B12  . Vitamin D, 25-hydroxy  . CBC with Differential  . Iron and TIBC([facility])  . Urinalysis Dipstick    Referral Orders  No referral(s) requested today    Kathe Becton,  MSN, FNP-BC [facility] [address] [place], [place] [address] [phone]- fax   Problem List Items Addressed This Visit      Cardiovascular and Mediastinum   HTN, goal below 130/80   Relevant Orders   Urinalysis Dipstick (Completed)   Migraine headache     Respiratory   Asthma     Other   Bipolar affective disorder ([place])    Other Visit Diagnoses    Hypertension, unspecified type    -  Primary   Insomnia, unspecified type       Anemia, unspecified type       Relevant Orders   CBC with Differential (Completed)   Iron and TIBC([facility]) (Completed)   Iron deficiency anemia, unspecified iron deficiency anemia type       Relevant Orders   CBC with Differential (Completed)   Iron and TIBC([facility]) (Completed)   HIV  infection, unspecified symptom status ([place])       Healthcare maintenance       Relevant Orders   TSH (Completed)   Comprehensive metabolic panel (Completed)   Vitamin B12 (Completed)   Vitamin D, 25-hydroxy (Completed)   Follow up          No orders of the defined types were placed in this encounter.   Follow-up: Return in about 3 months (around 03/01/2020).    Azzie Glatter, FNP

## 2019-12-01 LAB — CBC WITH DIFFERENTIAL/PLATELET
Basophils Absolute: 0.1 10*3/uL (ref 0.0–0.2)
Basos: 1 %
EOS (ABSOLUTE): 0.1 10*3/uL (ref 0.0–0.4)
Eos: 2 %
Hematocrit: 39.6 % (ref 34.0–46.6)
Hemoglobin: 13.7 g/dL (ref 11.1–15.9)
Immature Grans (Abs): 0 10*3/uL (ref 0.0–0.1)
Immature Granulocytes: 0 %
Lymphocytes Absolute: 1.8 10*3/uL (ref 0.7–3.1)
Lymphs: 46 %
MCH: 29.8 pg (ref 26.6–33.0)
MCHC: 34.6 g/dL (ref 31.5–35.7)
MCV: 86 fL (ref 79–97)
Monocytes Absolute: 0.3 10*3/uL (ref 0.1–0.9)
Monocytes: 7 %
Neutrophils Absolute: 1.7 10*3/uL (ref 1.4–7.0)
Neutrophils: 44 %
Platelets: 92 10*3/uL — CL (ref 150–450)
RBC: 4.59 x10E6/uL (ref 3.77–5.28)
RDW: 13.5 % (ref 11.7–15.4)
WBC: 3.9 10*3/uL (ref 3.4–10.8)

## 2019-12-01 LAB — COMPREHENSIVE METABOLIC PANEL
ALT: 80 IU/L — ABNORMAL HIGH (ref 0–32)
AST: 114 IU/L — ABNORMAL HIGH (ref 0–40)
Albumin/Globulin Ratio: 1.2 (ref 1.2–2.2)
Albumin: 3.8 g/dL (ref 3.8–4.8)
Alkaline Phosphatase: 157 IU/L — ABNORMAL HIGH (ref 48–121)
BUN/Creatinine Ratio: 9 (ref 9–23)
BUN: 8 mg/dL (ref 6–24)
Bilirubin Total: 0.8 mg/dL (ref 0.0–1.2)
CO2: 23 mmol/L (ref 20–29)
Calcium: 9.4 mg/dL (ref 8.7–10.2)
Chloride: 100 mmol/L (ref 96–106)
Creatinine, Ser: 0.89 mg/dL (ref 0.57–1.00)
GFR calc Af Amer: 90 mL/min/{1.73_m2} (ref >59)
GFR calc non Af Amer: 78 mL/min/{1.73_m2} (ref >59)
Globulin, Total: 3.2 g/dL (ref 1.5–4.5)
Glucose: 98 mg/dL (ref 65–99)
Potassium: 3.7 mmol/L (ref 3.5–5.2)
Sodium: 140 mmol/L (ref 134–144)
Total Protein: 7 g/dL (ref 6.0–8.5)

## 2019-12-01 LAB — IRON AND TIBC
Iron Saturation: 49 % (ref 15–55)
Iron: 119 ug/dL (ref 27–159)
Total Iron Binding Capacity: 244 ug/dL — ABNORMAL LOW (ref 250–450)
UIBC: 125 ug/dL — ABNORMAL LOW (ref 131–425)

## 2019-12-01 LAB — VITAMIN B12: Vitamin B-12: 1165 pg/mL (ref 232–1245)

## 2019-12-01 LAB — VITAMIN D 25 HYDROXY (VIT D DEFICIENCY, FRACTURES): Vit D, 25-Hydroxy: 26.1 ng/mL — ABNORMAL LOW (ref 30.0–100.0)

## 2019-12-01 LAB — TSH: TSH: 2.59 u[IU]/mL (ref 0.450–4.500)

## 2019-12-03 ENCOUNTER — Telehealth: Payer: Self-pay | Admitting: Family Medicine

## 2019-12-03 NOTE — Telephone Encounter (Signed)
Attempted to contact patient today to review labs. Message left for her to contact office.

## 2019-12-06 ENCOUNTER — Encounter: Payer: Self-pay | Admitting: Family Medicine

## 2019-12-20 ENCOUNTER — Encounter: Payer: Self-pay | Admitting: Family Medicine

## 2019-12-23 ENCOUNTER — Other Ambulatory Visit: Payer: Self-pay

## 2019-12-23 ENCOUNTER — Encounter: Payer: Self-pay | Admitting: Emergency Medicine

## 2019-12-23 ENCOUNTER — Ambulatory Visit: Payer: Self-pay

## 2019-12-23 ENCOUNTER — Ambulatory Visit
Admission: EM | Admit: 2019-12-23 | Discharge: 2019-12-23 | Disposition: A | Discharge status: Home / Self Care | Payer: Medicaid Other | Attending: Emergency Medicine | Admitting: Emergency Medicine

## 2019-12-23 DIAGNOSIS — G43009 Migraine without aura, not intractable, without status migrainosus: Secondary | ICD-10-CM | POA: Diagnosis not present

## 2019-12-23 MED ORDER — DEXAMETHASONE SODIUM PHOSPHATE 10 MG/ML IJ SOLN
10.0000 mg | Freq: Once | INTRAMUSCULAR | Status: AC
  Administered 2019-12-23: 10 mg via INTRAMUSCULAR

## 2019-12-23 MED ORDER — ONDANSETRON 4 MG PO TBDP
4.0000 mg | ORAL_TABLET | Freq: Once | ORAL | Status: AC
  Administered 2019-12-23: 4 mg via ORAL

## 2019-12-23 MED ORDER — KETOROLAC TROMETHAMINE 30 MG/ML IJ SOLN
30.0000 mg | Freq: Once | INTRAMUSCULAR | Status: AC
  Administered 2019-12-23: 30 mg via INTRAMUSCULAR

## 2019-12-23 NOTE — ED Provider Notes (Signed)
[facility]    CSN: [id number] Arrival date & time: 12/23/19  0911      History   Chief Complaint Chief Complaint  Patient presents with  . Appointment  . Headache    HPI Teresa [name] is a 49 y.o. female with history of migraines presenting for migraine.  States is been ongoing for the last few days.  No head injury, change in diet, lifestyle, medications.  Does note stress as clear started back up and she does not sleep well.  Denies fever, cough, nasal congestion, numbness, weakness, slurred speech.  No visual changes, auditory changes, dizziness.  Does endorse photophobia.    Past Medical History:  Diagnosis Date  . Anemia    iron infusions x 2  . Anxiety   . Arthritis    Neck, left shoulder  . Asthma    uses inhaler 2-3 x week  . Bipolar disorder ([place])   . Depression   . Endometriosis   . Fibroids   . GERD (gastroesophageal reflux disease)   . Headache    Migraines  . Hepatitis 2009   Hep C  . HIV infection ([place])   . Hypertension   . Pancreatitis   . Rash 09/18/2019  . Seasonal allergies   . SVD (spontaneous vaginal delivery)    x 1    Patient Active Problem List   Diagnosis Date Noted  . Rash 09/18/2019  . Hypertensive urgency 05/24/2019  . Asthma   . Depression   . Pelvic pain in female 05/16/2019  . Arthralgia 04/02/2019  . HTN, goal below 130/80 06/05/2018  . Iron deficiency anemia due to chronic blood loss 03/25/2014  . Abnormal uterine bleeding (AUB) 03/12/2014  . Bipolar affective disorder ([place]) 04/13/2012  . Endometriosis 04/13/2012  . Migraine headache 04/13/2012  . Hepatitis C infection 03/10/2011  . HIV disease ([place]) 03/10/2011    Past Surgical History:  Procedure Laterality Date  . ABDOMINAL HYSTERECTOMY    . CESAREAN SECTION     x2  . DILATION AND CURETTAGE OF UTERUS     x 2 - TAB  . HAND SURGERY Right    2 pins in wrist  . HYSTEROSCOPY N/A 10/11/2016   Procedure: HYSTEROSCOPY WITH HYDROTHERMAL ABLATION;   Surgeon: Donnamae Jude, MD;  Location: [facility];  Service: Gynecology;  Laterality: N/A;  Caryl Pina the HTA rep will be here.  Confirmed on 09/02/16.   Marland Kitchen LAPAROSCOPIC BILATERAL SALPINGECTOMY Bilateral 07/11/2018   Procedure: LAPAROSCOPIC BILATERAL SALPINGECTOMY;  Surgeon: Ward, Honor Loh, MD;  Location: [facility];  Service: Gynecology;  Laterality: Bilateral;  . LAPAROSCOPIC HYSTERECTOMY N/A 07/11/2018   Procedure: HYSTERECTOMY TOTAL LAPAROSCOPIC;  Surgeon: Ward, Honor Loh, MD;  Location: [facility];  Service: Gynecology;  Laterality: N/A;  . LAPAROSCOPIC TUBAL LIGATION Bilateral 10/11/2016   Procedure: LAPAROSCOPIC TUBAL LIGATION WITH FILSHIE CLIPS;  Surgeon: Donnamae Jude, MD;  Location: [facility];  Service: Gynecology;  Laterality: Bilateral;  . MOUTH SURGERY    . ROBOTIC ASSISTED LAPAROSCOPIC OVARIAN CYSTECTOMY Left 05/04/2019   Procedure: XI ROBOTIC ASSISTED LAPAROSCOPIC OVARIAN CYSTECTOMY;  Surgeon: Ward, Honor Loh, MD;  Location: [facility];  Service: Gynecology;  Laterality: Left;  . UMBILICAL HERNIA REPAIR N/A 07/11/2018   Procedure: LAPAROSCOPIC UMBILICAL HERNIA REPAIR;  Surgeon: Benjamine Sprague, DO;  Location: [facility];  Service: General;  Laterality: N/A;  . WISDOM TOOTH EXTRACTION      OB History    Gravida  5   Para  3   Term  3   Preterm      AB  2   Living        SAB      TAB      Ectopic      Multiple      Live Births  3            Home Medications    Prior to Admission medications   Medication Sig Start Date End Date Taking? Authorizing Provider  albuterol (PROAIR HFA) 108 (90 Base) MCG/ACT inhaler Inhale 2 puffs into the lungs every 6 (six) hours as needed for wheezing or shortness of breath.     [provider]  buPROPion (WELLBUTRIN) 75 MG tablet Take 150 mg by mouth daily.     [provider]  diclofenac Sodium (VOLTAREN) 1 % GEL Apply 2 g topically 4 (four) times daily.  04/02/19   [provider]  Dolutegravir-lamiVUDine (DOVATO)  50-300 MG TABS Take 1 tablet by mouth daily. 09/18/19   [name], [name], MD  Galcanezumab-gnlm [facility]) 120 MG/ML SOSY Inject 120 mg into the skin every 28 (twenty-eight) days.    [provider]  Glycopyrronium Tosylate 2.4 % PADS Apply topically. 06/06/19   [provider]  hydrochlorothiazide (HYDRODIURIL) 25 MG tablet Take 1 tablet (25 mg total) by mouth daily. 05/26/19 11/29/20  Sidney Ace, MD  pantoprazole (PROTONIX) 40 MG tablet Take 40 mg by mouth daily. Patient not taking: Reported on 11/30/2019    [provider]  sertraline (ZOLOFT) 100 MG tablet Take 150 mg by mouth at bedtime.     [provider]  SUMAtriptan (IMITREX) 50 MG tablet Take 1 tablet (50 mg total) by mouth every 2 (two) hours as needed for migraine or headache. May repeat in 2 hours if headache persists or recurs. 05/26/19   Ralene Muskrat B, MD  triamcinolone ointment (KENALOG) 0.5 % APPLY TOPICALLY TO THE AFFECTED AREA TWICE DAILY 11/07/19   [name], [name], MD  verapamil (CALAN) 80 MG tablet Take 1 tablet (80 mg total) by mouth 3 (three) times daily. 05/27/19 11/29/20  Sidney Ace, MD    Family History Family History  Problem Relation Age of Onset  . Breast cancer Paternal Grandmother   . Breast cancer Cousin   . Hypertension Mother   . Lung cancer Father   . Diabetes Father     Social History Social History   Tobacco Use  . Smoking status: Former Smoker    Packs/day: 0.25    Years: 25.00    Pack years: 6.25    Types: Cigarettes  . Smokeless tobacco: Never Used  . Tobacco comment: uses vapor occasionally  Vaping Use  . Vaping Use: Never used  Substance Use Topics  . Alcohol use: Yes    Comment: wine occ  . Drug use: Yes    Types: Marijuana    Comment: 2-3 x per month     Allergies   Naproxen sodium   Review of Systems As per HPI   Physical Exam Triage Vital Signs ED Triage Vitals  Enc Vitals Group     BP 12/23/19 0952 135/90      Pulse Rate 12/23/19 0952 64     Resp 12/23/19 0952 18     Temp 12/23/19 0952 97.8 F (36.6 C)     Temp Source 12/23/19 0952 Oral     SpO2 12/23/19 0952 99 %     Weight --      Height --  Head Circumference --      Peak Flow --      Pain Score 12/23/19 0953 8     Pain Loc --      Pain Edu? --      Excl. in [place]? --    No data found.  Updated Vital Signs BP 135/90 (BP Location: Right Arm)   Pulse 64   Temp 97.8 F (36.6 C) (Oral)   Resp 18   LMP 07/10/2018 (Exact Date)   SpO2 99%   Visual Acuity Right Eye Distance:   Left Eye Distance:   Bilateral Distance:    Right Eye Near:   Left Eye Near:    Bilateral Near:     Physical Exam Constitutional:      General: She is not in acute distress. HENT:     Head: Normocephalic and atraumatic.     Right Ear: Tympanic membrane, ear canal and external ear normal.     Left Ear: Tympanic membrane, ear canal and external ear normal.     Mouth/Throat:     Mouth: Mucous membranes are moist.     Pharynx: Oropharynx is clear.  Eyes:     General: No scleral icterus.    Extraocular Movements: Extraocular movements intact.     Conjunctiva/sclera: Conjunctivae normal.     Pupils: Pupils are equal, round, and reactive to light.  Cardiovascular:     Rate and Rhythm: Normal rate.  Pulmonary:     Effort: Pulmonary effort is normal. No respiratory distress.     Breath sounds: No wheezing.  Musculoskeletal:        General: No deformity. Normal range of motion.     Cervical back: Normal range of motion. No rigidity or tenderness.  Lymphadenopathy:     Cervical: No cervical adenopathy.  Skin:    Capillary Refill: Capillary refill takes less than 2 seconds.     Coloration: Skin is not jaundiced.     Findings: No bruising or rash.  Neurological:     Mental Status: She is alert.     Cranial Nerves: Cranial nerves are intact.     Sensory: Sensation is intact.     Motor: Motor function is intact.     Coordination: Coordination is  intact.     Gait: Gait is intact.  Psychiatric:        Mood and Affect: Mood normal.        Behavior: Behavior normal.      UC Treatments / Results  Labs (all labs ordered are listed, but only abnormal results are displayed) Labs Reviewed - No data to display  EKG   Radiology No results found.  Procedures Procedures (including critical care time)  Medications Ordered in UC Medications  ketorolac (TORADOL) 30 MG/ML injection 30 mg (has no administration in time range)  dexamethasone (DECADRON) injection 10 mg (has no administration in time range)  ondansetron (ZOFRAN-ODT) disintegrating tablet 4 mg (has no administration in time range)    Initial Impression / Assessment and Plan / UC Course  I have reviewed the triage vital signs and the nursing notes.  Pertinent labs & imaging results that were available during my care of the patient were reviewed by me and considered in my medical decision making (see chart for details).     Afebrile, nontoxic, without neurocognitive deficit today.  Given headache cocktail which she tolerated well.  Will follow up with PCP for further evaluation/management if needed.  Return precautions discussed, pt verbalized understanding and is  agreeable to plan. Final Clinical Impressions(s) / UC Diagnoses   Final diagnoses:  Migraine without aura and without status migrainosus, not intractable     Discharge Instructions     You were given medications today for your headache. Important to keep a log of your headaches: When they start, what alleviates them, pain on a scale of 1-10. Bring your headache log to your primary care for further evaluation as you may need to be on medications to help prevent headaches. Go to ER for worse headache of life, loss/change of vision, vomiting, fever, ear ringing, dizziness, weakness, facial droop/slurred speech, severe abdominal pain.    ED Prescriptions    None     PDMP not reviewed this encounter.    Hall[place]vin, [place], [place] 12/23/19 1010

## 2019-12-23 NOTE — Discharge Instructions (Addendum)
You were given medications today for your headache. Important to keep a log of your headaches: When they start, what alleviates them, pain on a scale of 1-10. Bring your headache log to your primary care for further evaluation as you may need to be on medications to help prevent headaches. Go to ER for worse headache of life, loss/change of vision, vomiting, fever, ear ringing, dizziness, weakness, facial droop/slurred speech, severe abdominal pain. 

## 2019-12-23 NOTE — ED Triage Notes (Signed)
Pt here for HA x 1 week with hx of same; pt sts normal home meds are not helping

## 2020-01-02 ENCOUNTER — Telehealth: Payer: Medicaid Other | Admitting: Family

## 2020-01-02 ENCOUNTER — Encounter: Payer: Self-pay | Admitting: Family Medicine

## 2020-01-02 DIAGNOSIS — Z20822 Contact with and (suspected) exposure to covid-19: Secondary | ICD-10-CM | POA: Diagnosis not present

## 2020-01-02 MED ORDER — BENZONATATE 100 MG PO CAPS: 100.0000 mg | ORAL_CAPSULE | Freq: Three times a day (TID) | ORAL | 0 refills | Status: DC | PRN

## 2020-01-02 NOTE — Progress Notes (Signed)
E-Visit for Corona Virus Screening  Your current symptoms could be consistent with the coronavirus.  Many health care providers can now test patients at their office but not all are.  [facility] has multiple testing sites. For information on our [place] testing locations and hours go to [email or web address]  We are enrolling you in our [place] for [place] . Daily you will receive a questionnaire within the [place] website. Our COVID 19 response team will be monitoring your responses daily.  Testing Information: The COVID-19 Community Testing sites are testing BY APPOINTMENT ONLY.  You can schedule online at [email or web address]  If you do not have access to a smart phone or computer you may call [phone] for an appointment.   Additional testing sites in the Community:  . For [facility] Testing sites in [facility]  FaceUpdate.[email or web address]  . For Pop-up testing sites in [name]  BowlDirectory.co.uk  . For [facility] [email or web address]  . For [facility] testing in [place] and [employer] [email or web address]  . For [facility] testing in [facility]   [email or web address]  For  more information about community testing call [phone]   Please quarantine yourself while awaiting your test results. Please stay home for a minimum of 10 days from the first day of illness with improving symptoms and you have had 24 hours of no fever (without the use of Tylenol (Acetaminophen) Motrin (Ibuprofen) or any fever reducing medication).  Also - Do not get tested prior to returning to work because once you have had a positive test the test can stay  positive for more than a month in some cases.   You should wear a mask or cloth face covering over your nose and mouth if you must be around other people or animals, including pets (even at home). Try to stay at least 6 feet away from other people. This will protect the people around you.  Please continue good preventive care measures, including:  frequent hand-washing, avoid touching your face, cover coughs/sneezes, stay out of crowds and keep a 6 foot distance from others.  COVID-19 is a respiratory illness with symptoms that are similar to the flu. Symptoms are typically mild to moderate, but there have been cases of severe illness and death due to the virus.   The following symptoms may appear 2-14 days after exposure: . Fever . Cough . Shortness of breath or difficulty breathing . Chills . Repeated shaking with chills . Muscle pain . Headache . Sore throat . New loss of taste or smell . Fatigue . Congestion or runny nose . Nausea or vomiting . Diarrhea  Go to the nearest hospital ED for assessment if fever/cough/breathlessness are severe or illness seems like a threat to life.  It is vitally important that if you feel that you have an infection such as this virus or any other virus that you stay home and away from places where you may spread it to others.  You should avoid contact with people age 9 and older.   You can use medication such as A prescription cough medication called Tessalon Perles 100 mg. You may take 1-2 capsules every 8 hours as needed for cough  You may also take acetaminophen (Tylenol) as needed for fever.  Reduce your risk of any infection by using the same precautions used for avoiding the common cold or flu:  Marland Kitchen Wash your hands often with soap and warm water for at least 20 seconds.  If soap and water are not  readily available, use an alcohol-based hand sanitizer with at least 60% alcohol.  . If coughing or sneezing, cover your mouth and nose by coughing or  sneezing into the elbow areas of your shirt or coat, into a tissue or into your sleeve (not your hands). . Avoid shaking hands with others and consider head nods or verbal greetings only. . Avoid touching your eyes, nose, or mouth with unwashed hands.  . Avoid close contact with people who are sick. . Avoid places or events with large numbers of people in one location, like concerts or sporting events. . Carefully consider travel plans you have or are making. . If you are planning any travel outside or inside the [place], visit the CDC's Travelers' Health webpage for the latest health notices. . If you have some symptoms but not all symptoms, continue to monitor at home and seek medical attention if your symptoms worsen. . If you are having a medical emergency, call 911.  HOME CARE . Only take medications as instructed by your medical team. . Drink plenty of fluids and get plenty of rest. . A steam or ultrasonic humidifier can help if you have congestion.   GET HELP RIGHT AWAY IF YOU HAVE EMERGENCY WARNING SIGNS** FOR COVID-19. If you or someone is showing any of these signs seek emergency medical care immediately. Call 911 or proceed to your closest emergency facility if: . You develop worsening high fever. . Trouble breathing . Bluish lips or face . Persistent pain or pressure in the chest . New confusion . Inability to wake or stay awake . You cough up blood. . Your symptoms become more severe  **This list is not all possible symptoms. Contact your medical provider for any symptoms that are sever or concerning to you.  MAKE SURE YOU   Understand these instructions.  Will watch your condition.  Will get help right away if you are not doing well or get worse.  Your e-visit answers were reviewed by a board certified advanced clinical practitioner to complete your personal care plan.  Depending on the condition, your plan could have included both over the counter or prescription  medications.  If there is a problem please reply once you have received a response from your provider.  Your safety is important to us.  If you have drug allergies check your prescription carefully.    You can use MyChart to ask questions about today's visit, request a non-urgent call back, or ask for a work or school excuse for 24 hours related to this e-Visit. If it has been greater than 24 hours you will need to follow up with your provider, or enter a new e-Visit to address those concerns. You will get an e-mail in the next two days asking about your experience.  I hope that your e-visit has been valuable and will speed your recovery. Thank you for using e-visits.   Approximately 5 minutes was spent documenting and reviewing patient's chart.   

## 2020-01-14 ENCOUNTER — Other Ambulatory Visit: Payer: Self-pay

## 2020-01-16 ENCOUNTER — Other Ambulatory Visit: Payer: Self-pay | Admitting: Family Medicine

## 2020-01-16 DIAGNOSIS — B349 Viral infection, unspecified: Secondary | ICD-10-CM

## 2020-01-16 MED ORDER — AMOXICILLIN-POT CLAVULANATE 875-125 MG PO TABS: 1.0000 | ORAL_TABLET | Freq: Two times a day (BID) | ORAL | 0 refills | Status: AC

## 2020-01-17 ENCOUNTER — Other Ambulatory Visit: Payer: Self-pay

## 2020-01-28 ENCOUNTER — Other Ambulatory Visit: Payer: Self-pay

## 2020-01-28 ENCOUNTER — Other Ambulatory Visit: Payer: Self-pay | Admitting: Family Medicine

## 2020-01-28 ENCOUNTER — Encounter: Payer: Self-pay | Admitting: Family Medicine

## 2020-01-28 ENCOUNTER — Encounter (HOSPITAL_COMMUNITY): Payer: Self-pay | Admitting: Emergency Medicine

## 2020-01-28 DIAGNOSIS — R110 Nausea: Secondary | ICD-10-CM | POA: Diagnosis not present

## 2020-01-28 DIAGNOSIS — R519 Headache, unspecified: Secondary | ICD-10-CM | POA: Diagnosis not present

## 2020-01-28 DIAGNOSIS — H538 Other visual disturbances: Secondary | ICD-10-CM | POA: Insufficient documentation

## 2020-01-28 DIAGNOSIS — Z87891 Personal history of nicotine dependence: Secondary | ICD-10-CM | POA: Insufficient documentation

## 2020-01-28 DIAGNOSIS — G43909 Migraine, unspecified, not intractable, without status migrainosus: Secondary | ICD-10-CM

## 2020-01-28 DIAGNOSIS — J45909 Unspecified asthma, uncomplicated: Secondary | ICD-10-CM | POA: Diagnosis not present

## 2020-01-28 DIAGNOSIS — Z79899 Other long term (current) drug therapy: Secondary | ICD-10-CM | POA: Diagnosis not present

## 2020-01-28 DIAGNOSIS — I10 Essential (primary) hypertension: Secondary | ICD-10-CM | POA: Insufficient documentation

## 2020-01-28 MED ORDER — SUMATRIPTAN SUCCINATE 50 MG PO TABS: 50.0000 mg | ORAL_TABLET | Freq: Two times a day (BID) | ORAL | 5 refills | Status: DC | PRN

## 2020-01-28 NOTE — ED Triage Notes (Signed)
Pt reports headache since this AM has taken sumatripin without relief.

## 2020-01-29 ENCOUNTER — Emergency Department (HOSPITAL_COMMUNITY)
Admission: EM | Admit: 2020-01-29 | Discharge: 2020-01-29 | Disposition: A | Discharge status: Home / Self Care | Payer: Medicaid Other | Attending: Emergency Medicine | Admitting: Emergency Medicine

## 2020-01-29 ENCOUNTER — Emergency Department (HOSPITAL_COMMUNITY): Payer: Medicaid Other

## 2020-01-29 DIAGNOSIS — R519 Headache, unspecified: Secondary | ICD-10-CM

## 2020-01-29 MED ORDER — METOCLOPRAMIDE HCL 5 MG/ML IJ SOLN
10.0000 mg | Freq: Once | INTRAMUSCULAR | Status: AC
  Administered 2020-01-29: 10 mg via INTRAVENOUS
  Filled 2020-01-29: qty 2

## 2020-01-29 MED ORDER — SODIUM CHLORIDE 0.9 % IV BOLUS
500.0000 mL | Freq: Once | INTRAVENOUS | Status: AC
  Administered 2020-01-29: 500 mL via INTRAVENOUS

## 2020-01-29 MED ORDER — MAGNESIUM SULFATE IN D5W 1-5 GM/100ML-% IV SOLN
1.0000 g | Freq: Once | INTRAVENOUS | Status: AC
  Administered 2020-01-29: 1 g via INTRAVENOUS
  Filled 2020-01-29: qty 100

## 2020-01-29 MED ORDER — DIPHENHYDRAMINE HCL 50 MG/ML IJ SOLN
12.5000 mg | Freq: Once | INTRAMUSCULAR | Status: AC
  Administered 2020-01-29: 12.5 mg via INTRAVENOUS
  Filled 2020-01-29: qty 1

## 2020-01-29 MED ORDER — KETOROLAC TROMETHAMINE 15 MG/ML IJ SOLN
15.0000 mg | Freq: Once | INTRAMUSCULAR | Status: AC
  Administered 2020-01-29: 15 mg via INTRAVENOUS
  Filled 2020-01-29: qty 1

## 2020-01-29 NOTE — ED Provider Notes (Signed)
[facility] DEPT Provider Note   CSN: [id number] Arrival date & time: 01/28/20  2004     History Chief Complaint  Patient presents with  . Migraine    Teresa [name] is a 49 y.o. female with a history of HIV, hypertension, bipolar disorder, anxiety, depression, hepatitis C, and migraines who presents to the emergency department with complaints of headache that began when she woke up (01/28/20).  Patient states that the headache is generalized however it is worse on the left side of her head.  She states that her gradual onset with steady progression, currently severe.  She has associated bilateral blurry vision, nausea, and photophobia.  No alleviating or aggravating factors.  She tried sumatriptan prior to arrival without relief.  She states that she does have a history of migraines, however this is different as typically her migraines are alleviated with sumatriptan.  She denies fever, chills, neck stiffness, numbness, weakness, syncope, chest pain, or dyspnea. Denies recent head trauma.   HPI     Past Medical History:  Diagnosis Date  . Anemia    iron infusions x 2  . Anxiety   . Arthritis    Neck, left shoulder  . Asthma    uses inhaler 2-3 x week  . Bipolar disorder ([place])   . Depression   . Endometriosis   . Fibroids   . GERD (gastroesophageal reflux disease)   . Headache    Migraines  . Hepatitis 2009   Hep C  . HIV infection ([place])   . Hypertension   . Pancreatitis   . Rash 09/18/2019  . Seasonal allergies   . SVD (spontaneous vaginal delivery)    x 1    Patient Active Problem List   Diagnosis Date Noted  . Rash 09/18/2019  . Hypertensive urgency 05/24/2019  . Asthma   . Depression   . Pelvic pain in female 05/16/2019  . Arthralgia 04/02/2019  . HTN, goal below 130/80 06/05/2018  . Iron deficiency anemia due to chronic blood loss 03/25/2014  . Abnormal uterine bleeding (AUB) 03/12/2014  . Bipolar affective disorder ([place])  04/13/2012  . Endometriosis 04/13/2012  . Migraine headache 04/13/2012  . Hepatitis C infection 03/10/2011  . HIV disease ([place]) 03/10/2011    Past Surgical History:  Procedure Laterality Date  . ABDOMINAL HYSTERECTOMY    . CESAREAN SECTION     x2  . DILATION AND CURETTAGE OF UTERUS     x 2 - TAB  . HAND SURGERY Right    2 pins in wrist  . HYSTEROSCOPY N/A 10/11/2016   Procedure: HYSTEROSCOPY WITH HYDROTHERMAL ABLATION;  Surgeon: Donnamae Jude, MD;  Location: [facility];  Service: Gynecology;  Laterality: N/A;  Caryl Pina the HTA rep will be here.  Confirmed on 09/02/16.   Marland Kitchen LAPAROSCOPIC BILATERAL SALPINGECTOMY Bilateral 07/11/2018   Procedure: LAPAROSCOPIC BILATERAL SALPINGECTOMY;  Surgeon: Ward, Honor Loh, MD;  Location: [facility];  Service: Gynecology;  Laterality: Bilateral;  . LAPAROSCOPIC HYSTERECTOMY N/A 07/11/2018   Procedure: HYSTERECTOMY TOTAL LAPAROSCOPIC;  Surgeon: Ward, Honor Loh, MD;  Location: [facility];  Service: Gynecology;  Laterality: N/A;  . LAPAROSCOPIC TUBAL LIGATION Bilateral 10/11/2016   Procedure: LAPAROSCOPIC TUBAL LIGATION WITH FILSHIE CLIPS;  Surgeon: Donnamae Jude, MD;  Location: [facility];  Service: Gynecology;  Laterality: Bilateral;  . MOUTH SURGERY    . ROBOTIC ASSISTED LAPAROSCOPIC OVARIAN CYSTECTOMY Left 05/04/2019   Procedure: XI ROBOTIC ASSISTED LAPAROSCOPIC OVARIAN CYSTECTOMY;  Surgeon: Ward, Honor Loh, MD;  Location:  [facility];  Service: Gynecology;  Laterality: Left;  . UMBILICAL HERNIA REPAIR N/A 07/11/2018   Procedure: LAPAROSCOPIC UMBILICAL HERNIA REPAIR;  Surgeon: Benjamine Sprague, DO;  Location: [facility];  Service: General;  Laterality: N/A;  . WISDOM TOOTH EXTRACTION       OB History    Gravida  5   Para  3   Term  3   Preterm      AB  2   Living        SAB      TAB      Ectopic      Multiple      Live Births  3           Family History  Problem Relation Age of Onset  . Breast cancer Paternal Grandmother   . Breast cancer Cousin    . Hypertension Mother   . Lung cancer Father   . Diabetes Father     Social History   Tobacco Use  . Smoking status: Former Smoker    Packs/day: 0.25    Years: 25.00    Pack years: 6.25    Types: Cigarettes  . Smokeless tobacco: Never Used  . Tobacco comment: uses vapor occasionally  Vaping Use  . Vaping Use: Never used  Substance Use Topics  . Alcohol use: Yes    Comment: wine occ  . Drug use: Yes    Types: Marijuana    Comment: 2-3 x per month    Home Medications Prior to Admission medications   Medication Sig Start Date End Date Taking? Authorizing Provider  albuterol (PROAIR HFA) 108 (90 Base) MCG/ACT inhaler Inhale 2 puffs into the lungs every 6 (six) hours as needed for wheezing or shortness of breath.     [provider]  benzonatate (TESSALON PERLES) 100 MG capsule Take 1 capsule (100 mg total) by mouth 3 (three) times daily as needed. 01/02/20   Sharion Balloon, FNP  buPROPion (WELLBUTRIN) 75 MG tablet Take 150 mg by mouth daily.     [provider]  diclofenac Sodium (VOLTAREN) 1 % GEL Apply 2 g topically 4 (four) times daily.  04/02/19   [provider]  Dolutegravir-lamiVUDine (DOVATO) 50-300 MG TABS Take 1 tablet by mouth daily. 09/18/19   [name], [name], MD  Galcanezumab-gnlm [facility]) 120 MG/ML SOSY Inject 120 mg into the skin every 28 (twenty-eight) days.    [provider]  Glycopyrronium Tosylate 2.4 % PADS Apply topically. 06/06/19   [provider]  hydrochlorothiazide (HYDRODIURIL) 25 MG tablet Take 1 tablet (25 mg total) by mouth daily. 05/26/19 11/29/20  Sidney Ace, MD  pantoprazole (PROTONIX) 40 MG tablet Take 40 mg by mouth daily. Patient not taking: Reported on 11/30/2019    [provider]  sertraline (ZOLOFT) 100 MG tablet Take 150 mg by mouth at bedtime.     [provider]  SUMAtriptan (IMITREX) 50 MG tablet Take 1 tablet (50 mg total) by mouth 2 (two) times daily as needed for  migraine or headache. 01/28/20   Azzie Glatter, FNP  triamcinolone ointment (KENALOG) 0.5 % APPLY TOPICALLY TO THE AFFECTED AREA TWICE DAILY 11/07/19   [name], [name], MD  verapamil (CALAN) 80 MG tablet Take 1 tablet (80 mg total) by mouth 3 (three) times daily. 05/27/19 11/29/20  Sidney Ace, MD    Allergies    Naproxen sodium  Review of Systems   Review of Systems  Constitutional: Negative for  chills and fever.  Eyes: Positive for visual disturbance.  Respiratory: Negative for shortness of breath.   Cardiovascular: Negative for chest pain.  Gastrointestinal: Positive for nausea. Negative for abdominal pain.  Musculoskeletal: Negative for neck stiffness.  Neurological: Positive for headaches. Negative for seizures, syncope, speech difficulty, weakness and numbness.  All other systems reviewed and are negative.   Physical Exam Updated Vital Signs BP (!) 165/96 (BP Location: Left Arm)   Pulse 80   Temp 98.6 F (37 C) (Oral)   Resp 19   Ht 5\' 6"  (1.676 m)   Wt 73.1 kg   LMP 07/10/2018 (Exact Date)   SpO2 97%   BMI 26.02 kg/m   Physical Exam Vitals and nursing note reviewed.  Constitutional:      General: She is not in acute distress.    Appearance: She is well-developed. She is not toxic-appearing.  HENT:     Head: Normocephalic and atraumatic.  Eyes:     General: Vision grossly intact. Gaze aligned appropriately.        Right eye: No discharge.        Left eye: No discharge.     Extraocular Movements: Extraocular movements intact.     Conjunctiva/sclera: Conjunctivae normal.     Comments: PERRL. No proptosis.   Cardiovascular:     Rate and Rhythm: Normal rate and regular rhythm.  Pulmonary:     Effort: Pulmonary effort is normal. No respiratory distress.     Breath sounds: Normal breath sounds. No wheezing, rhonchi or rales.  Abdominal:     General: There is no distension.     Palpations: Abdomen is soft.     Tenderness: There is no abdominal  tenderness.  Musculoskeletal:     Cervical back: Normal range of motion and neck supple. No rigidity.  Lymphadenopathy:     Cervical: No cervical adenopathy.  Skin:    General: Skin is warm and dry.     Findings: No rash.  Neurological:     Comments: Alert. Clear speech. No facial droop. CNIII-XII grossly intact. Bilateral upper and lower extremities' sensation grossly intact. 5/5 symmetric strength with grip strength and with plantar and dorsi flexion bilaterally . Normal finger to nose bilaterally. Negative pronator drift. Negative Romberg sign. Gait is steady and intact.   Psychiatric:        Behavior: Behavior normal.    ED Results / Procedures / Treatments   Labs (all labs ordered are listed, but only abnormal results are displayed) Labs Reviewed - No data to display  EKG None  Radiology CT Head Wo Contrast  Result Date: 01/29/2020 CLINICAL DATA:  Headache EXAM: CT HEAD WITHOUT CONTRAST TECHNIQUE: Contiguous axial images were obtained from the base of the skull through the vertex without intravenous contrast. COMPARISON:  May 16, 2019 FINDINGS: Brain: No evidence of acute territorial infarction, hemorrhage, hydrocephalus,extra-axial collection or mass lesion/mass effect. Normal gray-white differentiation. Ventricles are normal in size and contour. Vascular: No hyperdense vessel or unexpected calcification. Skull: The skull is intact. No fracture or focal lesion identified. Sinuses/Orbits: The visualized paranasal sinuses and mastoid air cells are clear. The orbits and globes intact. Other: None IMPRESSION: No acute intracranial abnormality. Electronically Signed   By: Prudencio Pair M.D.   On: 01/29/2020 03:25    Procedures Procedures (including critical care time)  Medications Ordered in ED Medications  metoCLOPramide (REGLAN) injection 10 mg (10 mg Intravenous Given 01/29/20 0327)  diphenhydrAMINE (BENADRYL) injection 12.5 mg (12.5 mg Intravenous Given 01/29/20 0327)  sodium  chloride 0.9 % bolus 500 mL (0 mLs Intravenous Stopped 01/29/20 0452)  ketorolac (TORADOL) 15 MG/ML injection 15 mg (15 mg Intravenous Given 01/29/20 0452)  magnesium sulfate IVPB 1 g 100 mL (1 g Intravenous New Bag/Given 01/29/20 3220)    ED Course  I have reviewed the triage vital signs and the nursing notes.  Pertinent labs & imaging results that were available during my care of the patient were reviewed by me and considered in my medical decision making (see chart for details).    MDM Rules/Calculators/A&P                         Patient presents to the ED with complaints of headache. Patient is nontoxic, vitals are within normal limits with the exception of elevated blood pressure.  She has a benign physical exam without focal neurologic deficits, visual deficits, fever, or nuchal rigidity.  Given patient states that her headache is somewhat different a head CT will be obtained.  Will give a migraine cocktail with plan for reassessment.  Additional history obtained:  Additional history obtained from chart review nursing note reviewed, several prior ER/urgent care visits for headaches in the past..   Imaging Studies ordered:  I ordered imaging studies which included CT head, I independently visualized and interpreted imaging which showed no acute process.  On reassessment following Reglan, Benadryl, and fluids patient's pain is improved to a 6 out of 10 in severity.  Toradol and magnesium given subsequently with significant improvement, patient states she feels much better and she is ready to go home.  She remains with a benign physical exam.  Overall have a low suspicion for [facility], ICH, ischemic CVA, dural venous sinus thrombosis, acute glaucoma, giant cell arteritis, mass, or meningitis based on patient presentation, physical exam, head CT, and improvement with medications given in the ED. I discussed results, treatment plan, need for PCP follow-up, and return precautions with the patient.  Provided opportunity for questions, patient confirmed understanding and is in agreement with plan.    Portions of this note were generated with [employer]. Dictation errors may occur despite best attempts at proofreading.  Final Clinical Impression(s) / ED Diagnoses Final diagnoses:  Acute nonintractable headache, unspecified headache type    Rx / DC Orders ED Discharge Orders    None       Amaryllis Dyke, PA-C 01/29/20 2542    Orpah Greek, MD 01/29/20 [phone]

## 2020-01-29 NOTE — Discharge Instructions (Addendum)
You were seen in the emergency department today for a headache, your head CT was normal, you were given a migraine cocktail with improvement of your symptoms.  Please take your previously prescribed migraine medications per prescribing provider instructions.   Follow with primary care neurology within 3 days.  Return to the ER for new or worsening symptoms including but not limited to worsening headache, sudden change in headache, change in your vision, numbness, weakness, passing out, fever, trouble moving your neck, or any other concerns.

## 2020-01-30 ENCOUNTER — Encounter (HOSPITAL_COMMUNITY): Payer: Self-pay | Admitting: *Deleted

## 2020-01-30 ENCOUNTER — Ambulatory Visit (HOSPITAL_COMMUNITY)
Admission: EM | Admit: 2020-01-30 | Discharge: 2020-01-30 | Disposition: A | Discharge status: Home / Self Care | Payer: Medicaid Other | Attending: Family Medicine | Admitting: Family Medicine

## 2020-01-30 ENCOUNTER — Other Ambulatory Visit: Payer: Self-pay

## 2020-01-30 DIAGNOSIS — G43819 Other migraine, intractable, without status migrainosus: Secondary | ICD-10-CM

## 2020-01-30 MED ORDER — KETOROLAC TROMETHAMINE 30 MG/ML IJ SOLN
INTRAMUSCULAR | Status: AC
  Filled 2020-01-30: qty 1

## 2020-01-30 MED ORDER — DEXAMETHASONE SODIUM PHOSPHATE 10 MG/ML IJ SOLN
10.0000 mg | Freq: Once | INTRAMUSCULAR | Status: AC
  Administered 2020-01-30: 10 mg via INTRAMUSCULAR

## 2020-01-30 MED ORDER — ONDANSETRON 4 MG PO TBDP
4.0000 mg | ORAL_TABLET | Freq: Once | ORAL | Status: AC
  Administered 2020-01-30: 4 mg via ORAL

## 2020-01-30 MED ORDER — ONDANSETRON 4 MG PO TBDP
ORAL_TABLET | ORAL | Status: AC
  Filled 2020-01-30: qty 1

## 2020-01-30 MED ORDER — KETOROLAC TROMETHAMINE 30 MG/ML IJ SOLN
30.0000 mg | Freq: Once | INTRAMUSCULAR | Status: AC
  Administered 2020-01-30: 30 mg via INTRAMUSCULAR

## 2020-01-30 MED ORDER — DEXAMETHASONE SODIUM PHOSPHATE 10 MG/ML IJ SOLN
INTRAMUSCULAR | Status: AC
  Filled 2020-01-30: qty 1

## 2020-01-30 MED ORDER — HYDROCODONE-ACETAMINOPHEN 5-325 MG PO TABS
ORAL_TABLET | ORAL | Status: AC
  Filled 2020-01-30: qty 1

## 2020-01-30 MED ORDER — ONDANSETRON 4 MG PO TBDP: 4.0000 mg | ORAL_TABLET | Freq: Three times a day (TID) | ORAL | 0 refills | Status: DC | PRN

## 2020-01-30 MED ORDER — HYDROCODONE-ACETAMINOPHEN 5-325 MG PO TABS
1.0000 | ORAL_TABLET | Freq: Once | ORAL | Status: AC
  Administered 2020-01-30: 1 via ORAL

## 2020-01-30 NOTE — ED Triage Notes (Addendum)
Patient in with complaints of a migraine headache x 3 days. Imitrex states that she has been on the medication for years but has not experienced any relief. Patient was seen in the emergency room on yesterday and received medication but migraine has returned. Patient was COVID positive last month.

## 2020-01-30 NOTE — ED Provider Notes (Signed)
[facility]   [id number] 01/30/20 Arrival Time: 6440  ASSESSMENT & PLAN:  1. Other migraine without status migrainosus, intractable     Meds ordered this encounter  Medications  . ketorolac (TORADOL) 30 MG/ML injection 30 mg  . dexamethasone (DECADRON) injection 10 mg  . ondansetron (ZOFRAN-ODT) disintegrating tablet 4 mg  . HYDROcodone-acetaminophen (NORCO/VICODIN) 5-325 MG per tablet 1 tablet  . ondansetron (ZOFRAN-ODT) 4 MG disintegrating tablet    Sig: Take 1 tablet (4 mg total) by mouth every 8 (eight) hours as needed for nausea or vomiting.    Dispense:  15 tablet    Refill:  0   Reports that headache is easing. Work note provided.  Normal neurological exam. Afebrile without nuchal rigidity. Discussed. Current presentation and symptoms are consistent with prior migraines and are not consistent with SAH, ICH, meningitis, or temporal arteritis. Without fever, focal neuro logical deficits, nuchal rigidity, or change in vision. No indication for repeat neurodiagnostic workup at this time. Normal head CT yesterday. Discussed.   Recommend:  Follow-up Information    Schedule an appointment as soon as possible for a visit  with Azzie Glatter, FNP.   Specialty: Family Medicine Why: To discuss your continued headaches. Contact information: [place] [address] [phone]                Reviewed expectations re: course of current medical issues. Questions answered. Outlined signs and symptoms indicating need for more acute intervention. Patient verbalized understanding. After Visit Summary given.   SUBJECTIVE: History from: Patient Patient is able to give a clear and coherent history.  Teresa [name] is a 49 y.o. female who presents with complaint of a migraine headache. Seen in ED yesterday. Headache relieved with treatment. Now has returned. Normal head CT. Describes current headache as generalized but more on left; throbbing.  Gradual return of symptoms. Associated symptoms: Preceding aura: no. Nausea/vomiting: nausea without emesis. Vision changes: occasional blurry vision. Increased sensitivity to light and to noises: yes. Fever: no. Sinus pressure/congestion: no. Extremity weakness: no. Current headache has limited normal daily activities. Denies depression, dizziness, loss of balance, muscle weakness, numbness of extremities and speech difficulties. No head injury reported. Ambulatory without difficulty. No recent travel.    OBJECTIVE:  Vitals:   01/30/20 1152  BP: (!) 155/96  Pulse: 66  Temp: 98.3 F (36.8 C)  TempSrc: Oral  SpO2: 100%    General appearance: alert; NAD; prefers darkened room HENT: normocephalic; atraumatic Eyes: PERRLA; EOMI; conjunctivae normal Neck: supple with FROM Lungs: clear to auscultation bilaterally; unlabored respirations Heart: regular rate and rhythm Extremities: no edema; symmetrical with no gross deformities Skin: warm and dry Neurologic: alert; speech is fluent and clear without dysarthria or aphasia; CN 2-12 grossly intact; no facial droop; normal gait; normal symmetric reflexes; normal extremity strength and sensation throughout Psychological: alert and cooperative; normal mood and affect   Allergies  Allergen Reactions  . Naproxen Sodium Hives    Can take ibuprofen without causing any hives    Past Medical History:  Diagnosis Date  . Anemia    iron infusions x 2  . Anxiety   . Arthritis    Neck, left shoulder  . Asthma    uses inhaler 2-3 x week  . Bipolar disorder ([place])   . Depression   . Endometriosis   . Fibroids   . GERD (gastroesophageal reflux disease)   . Headache    Migraines  . Hepatitis 2009   Hep  C  . HIV infection ([place])   . Hypertension   . Pancreatitis   . Rash 09/18/2019  . Seasonal allergies   . SVD (spontaneous vaginal delivery)    x 1   Social History   Socioeconomic History  . Marital status: Legally Separated     Spouse name: Not on file  . Number of children: Not on file  . Years of education: Not on file  . Highest education level: Not on file  Occupational History  . Not on file  Tobacco Use  . Smoking status: Former Smoker    Packs/day: 0.25    Years: 25.00    Pack years: 6.25    Types: Cigarettes  . Smokeless tobacco: Never Used  . Tobacco comment: uses vapor occasionally  Vaping Use  . Vaping Use: Never used  Substance and Sexual Activity  . Alcohol use: Yes    Comment: wine occ  . Drug use: Yes    Types: Marijuana    Comment: 2-3 x per month  . Sexual activity: Not Currently  Other Topics Concern  . Not on file  Social History Narrative  . Not on file   Social Determinants of Health   Financial Resource Strain:   . Difficulty of Paying Living Expenses: Not on file  Food Insecurity:   . Worried About Charity fundraiser in the Last Year: Not on file  . Ran Out of Food in the Last Year: Not on file  Transportation Needs:   . Lack of Transportation (Medical): Not on file  . Lack of Transportation (Non-Medical): Not on file  Physical Activity:   . Days of Exercise per Week: Not on file  . Minutes of Exercise per Session: Not on file  Stress:   . Feeling of Stress : Not on file  Social Connections:   . Frequency of Communication with Friends and Family: Not on file  . Frequency of Social Gatherings with Friends and Family: Not on file  . Attends Religious Services: Not on file  . Active Member of Clubs or Organizations: Not on file  . Attends Archivist Meetings: Not on file  . Marital Status: Not on file  Intimate Partner Violence:   . Fear of Current or Ex-Partner: Not on file  . Emotionally Abused: Not on file  . Physically Abused: Not on file  . Sexually Abused: Not on file   Family History  Problem Relation Age of Onset  . Breast cancer Paternal Grandmother   . Breast cancer Cousin   . Hypertension Mother   . Lung cancer Father   . Diabetes  Father    Past Surgical History:  Procedure Laterality Date  . ABDOMINAL HYSTERECTOMY    . CESAREAN SECTION     x2  . DILATION AND CURETTAGE OF UTERUS     x 2 - TAB  . HAND SURGERY Right    2 pins in wrist  . HYSTEROSCOPY N/A 10/11/2016   Procedure: HYSTEROSCOPY WITH HYDROTHERMAL ABLATION;  Surgeon: Donnamae Jude, MD;  Location: [facility];  Service: Gynecology;  Laterality: N/A;  Caryl Pina the HTA rep will be here.  Confirmed on 09/02/16.   Marland Kitchen LAPAROSCOPIC BILATERAL SALPINGECTOMY Bilateral 07/11/2018   Procedure: LAPAROSCOPIC BILATERAL SALPINGECTOMY;  Surgeon: Ward, Honor Loh, MD;  Location: [facility];  Service: Gynecology;  Laterality: Bilateral;  . LAPAROSCOPIC HYSTERECTOMY N/A 07/11/2018   Procedure: HYSTERECTOMY TOTAL LAPAROSCOPIC;  Surgeon: Ward, Honor Loh, MD;  Location: [facility];  Service: Gynecology;  Laterality: N/A;  . LAPAROSCOPIC TUBAL LIGATION Bilateral 10/11/2016   Procedure: LAPAROSCOPIC TUBAL LIGATION WITH FILSHIE CLIPS;  Surgeon: Donnamae Jude, MD;  Location: [facility];  Service: Gynecology;  Laterality: Bilateral;  . MOUTH SURGERY    . ROBOTIC ASSISTED LAPAROSCOPIC OVARIAN CYSTECTOMY Left 05/04/2019   Procedure: XI ROBOTIC ASSISTED LAPAROSCOPIC OVARIAN CYSTECTOMY;  Surgeon: Ward, Honor Loh, MD;  Location: [facility];  Service: Gynecology;  Laterality: Left;  . UMBILICAL HERNIA REPAIR N/A 07/11/2018   Procedure: LAPAROSCOPIC UMBILICAL HERNIA REPAIR;  Surgeon: Benjamine Sprague, DO;  Location: [facility];  Service: General;  Laterality: N/A;  . WISDOM TOOTH EXTRACTION       Vanessa Kick, MD 01/30/20 1332

## 2020-02-04 ENCOUNTER — Other Ambulatory Visit: Payer: Self-pay | Admitting: Family Medicine

## 2020-02-04 DIAGNOSIS — J302 Other seasonal allergic rhinitis: Secondary | ICD-10-CM

## 2020-02-04 DIAGNOSIS — L299 Pruritus, unspecified: Secondary | ICD-10-CM

## 2020-02-04 MED ORDER — CETIRIZINE HCL 10 MG PO TABS: 10.0000 mg | ORAL_TABLET | Freq: Every day | ORAL | 11 refills | Status: DC | PRN

## 2020-02-04 MED ORDER — HYDROXYZINE HCL 25 MG PO TABS: 25.0000 mg | ORAL_TABLET | Freq: Three times a day (TID) | ORAL | 1 refills | Status: DC | PRN

## 2020-02-15 ENCOUNTER — Other Ambulatory Visit: Payer: Self-pay | Admitting: Infectious Disease

## 2020-02-19 ENCOUNTER — Telehealth: Payer: Self-pay

## 2020-02-19 NOTE — Telephone Encounter (Signed)
Called to get patient a follow up scheduled, left a voicemail to call us back to get that scheduled

## 2020-02-25 ENCOUNTER — Other Ambulatory Visit: Payer: Self-pay | Admitting: Family Medicine

## 2020-02-25 DIAGNOSIS — L299 Pruritus, unspecified: Secondary | ICD-10-CM

## 2020-02-27 ENCOUNTER — Other Ambulatory Visit: Payer: Self-pay

## 2020-02-27 ENCOUNTER — Encounter: Payer: Self-pay | Admitting: Family Medicine

## 2020-02-27 ENCOUNTER — Encounter (HOSPITAL_COMMUNITY): Payer: Self-pay

## 2020-02-27 ENCOUNTER — Ambulatory Visit (HOSPITAL_COMMUNITY)
Admission: EM | Admit: 2020-02-27 | Discharge: 2020-02-27 | Disposition: A | Discharge status: Home / Self Care | Payer: Medicaid Other | Attending: Family Medicine | Admitting: Family Medicine

## 2020-02-27 DIAGNOSIS — L299 Pruritus, unspecified: Secondary | ICD-10-CM | POA: Diagnosis not present

## 2020-02-27 LAB — COMPREHENSIVE METABOLIC PANEL
ALT: 41 U/L (ref 0–44)
AST: 64 U/L — ABNORMAL HIGH (ref 15–41)
Albumin: 3.1 g/dL — ABNORMAL LOW (ref 3.5–5.0)
Alkaline Phosphatase: 146 U/L — ABNORMAL HIGH (ref 38–126)
Anion gap: 8 (ref 5–15)
BUN: 5 mg/dL — ABNORMAL LOW (ref 6–20)
CO2: 28 mmol/L (ref 22–32)
Calcium: 8.7 mg/dL — ABNORMAL LOW (ref 8.9–10.3)
Chloride: 102 mmol/L (ref 98–111)
Creatinine, Ser: 0.67 mg/dL (ref 0.44–1.00)
GFR, Estimated: >60 mL/min (ref >60)
Glucose, Bld: 103 mg/dL — ABNORMAL HIGH (ref 70–99)
Potassium: 4 mmol/L (ref 3.5–5.1)
Sodium: 138 mmol/L (ref 135–145)
Total Bilirubin: 0.7 mg/dL (ref 0.3–1.2)
Total Protein: 6.4 g/dL — ABNORMAL LOW (ref 6.5–8.1)

## 2020-02-27 LAB — TSH: TSH: 1.571 u[IU]/mL (ref 0.350–4.500)

## 2020-02-27 MED ORDER — HYDROXYZINE HCL 25 MG PO TABS: 50.0000 mg | ORAL_TABLET | Freq: Four times a day (QID) | ORAL | 1 refills | Status: DC

## 2020-02-27 MED ORDER — PERMETHRIN 5 % EX CREA: TOPICAL_CREAM | CUTANEOUS | 1 refills | Status: DC

## 2020-02-27 NOTE — ED Provider Notes (Signed)
[place]   [id number] 02/27/20 Arrival Time: 3559  ASSESSMENT & PLAN:  1. Pruritus     Pending; Labs Reviewed  COMPREHENSIVE METABOLIC PANEL  TSH   See AVS for d/c information. Begin: Meds ordered this encounter  Medications  . hydrOXYzine (ATARAX/VISTARIL) 25 MG tablet    Sig: Take 2 tablets (50 mg total) by mouth every 6 (six) hours.    Dispense:  30 tablet    Refill:  1  . permethrin (ELIMITE) 5 % cream    Sig: Apply from neck down before bed then wash off in the morning. May repeat in one week.    Dispense:  60 g    Refill:  1     Follow-up Information    Azzie Glatter, FNP.   Specialty: Family Medicine Why: If worsening or failing to improve as anticipated. Contact information: [place] [address] [phone]               Reviewed expectations re: course of current medical issues. Questions answered. Outlined signs and symptoms indicating need for more acute intervention. Understanding verbalized. After Visit Summary given.   SUBJECTIVE: History from: patient. Teresa [name] is a 49 y.o. female who reports generalized itching over the past 1 month. No changes preceding symptoms. No recent illnesses or new medications. No travel. No h/o similar. Vistaril with mild but temp relief. Seen by PCP. Normal PO intake without n/v/d.  Social History   Substance and Sexual Activity  Alcohol Use Yes   Comment: wine occ     OBJECTIVE:  Vitals:   02/27/20 1027  BP: (!) 144/77  Pulse: 82  Resp: 18  Temp: 98 F (36.7 C)  TempSrc: Oral  SpO2: 97%    General appearance: alert; no distress Eyes: PERRLA; EOMI; conjunctiva normal HENT: [place]; AT; without nasal congestion Neck: supple  Lungs: speaks full sentences without difficulty; unlabored Extremities: no edema Skin: warm and dry; no rashes or lesions Neurologic: normal gait Psychological: alert and cooperative; normal mood and affect  Labs: Labs Reviewed   COMPREHENSIVE METABOLIC PANEL  TSH     Allergies  Allergen Reactions  . Naproxen Sodium Hives    Can take ibuprofen without causing any hives    Past Medical History:  Diagnosis Date  . Anemia    iron infusions x 2  . Anxiety   . Arthritis    Neck, left shoulder  . Asthma    uses inhaler 2-3 x week  . Bipolar disorder ([place])   . Depression   . Endometriosis   . Fibroids   . GERD (gastroesophageal reflux disease)   . Headache    Migraines  . Hepatitis 2009   Hep C  . HIV infection ([place])   . Hypertension   . Pancreatitis   . Rash 09/18/2019  . Seasonal allergies   . SVD (spontaneous vaginal delivery)    x 1   Social History   Socioeconomic History  . Marital status: Legally Separated    Spouse name: Not on file  . Number of children: Not on file  . Years of education: Not on file  . Highest education level: Not on file  Occupational History  . Not on file  Tobacco Use  . Smoking status: Former Smoker    Packs/day: 0.25    Years: 25.00    Pack years: 6.25    Types: Cigarettes  . Smokeless tobacco: Never Used  . Tobacco comment: uses vapor occasionally  Vaping Use  . Vaping Use: Never used  Substance and Sexual Activity  . Alcohol use: Yes    Comment: wine occ  . Drug use: Yes    Types: Marijuana    Comment: 2-3 x per month  . Sexual activity: Not Currently  Other Topics Concern  . Not on file  Social History Narrative  . Not on file   Social Determinants of Health   Financial Resource Strain:   . Difficulty of Paying Living Expenses: Not on file  Food Insecurity:   . Worried About Charity fundraiser in the Last Year: Not on file  . Ran Out of Food in the Last Year: Not on file  Transportation Needs:   . Lack of Transportation (Medical): Not on file  . Lack of Transportation (Non-Medical): Not on file  Physical Activity:   . Days of Exercise per Week: Not on file  . Minutes of Exercise per Session: Not on file  Stress:   . Feeling of  Stress : Not on file  Social Connections:   . Frequency of Communication with Friends and Family: Not on file  . Frequency of Social Gatherings with Friends and Family: Not on file  . Attends Religious Services: Not on file  . Active Member of Clubs or Organizations: Not on file  . Attends Archivist Meetings: Not on file  . Marital Status: Not on file  Intimate Partner Violence:   . Fear of Current or Ex-Partner: Not on file  . Emotionally Abused: Not on file  . Physically Abused: Not on file  . Sexually Abused: Not on file   Family History  Problem Relation Age of Onset  . Breast cancer Paternal Grandmother   . Breast cancer Cousin   . Hypertension Mother   . Lung cancer Father   . Diabetes Father    Past Surgical History:  Procedure Laterality Date  . ABDOMINAL HYSTERECTOMY    . CESAREAN SECTION     x2  . DILATION AND CURETTAGE OF UTERUS     x 2 - TAB  . HAND SURGERY Right    2 pins in wrist  . HYSTEROSCOPY N/A 10/11/2016   Procedure: HYSTEROSCOPY WITH HYDROTHERMAL ABLATION;  Surgeon: Donnamae Jude, MD;  Location: [facility];  Service: Gynecology;  Laterality: N/A;  Caryl Pina the HTA rep will be here.  Confirmed on 09/02/16.   Marland Kitchen LAPAROSCOPIC BILATERAL SALPINGECTOMY Bilateral 07/11/2018   Procedure: LAPAROSCOPIC BILATERAL SALPINGECTOMY;  Surgeon: Ward, Honor Loh, MD;  Location: [facility];  Service: Gynecology;  Laterality: Bilateral;  . LAPAROSCOPIC HYSTERECTOMY N/A 07/11/2018   Procedure: HYSTERECTOMY TOTAL LAPAROSCOPIC;  Surgeon: Ward, Honor Loh, MD;  Location: [facility];  Service: Gynecology;  Laterality: N/A;  . LAPAROSCOPIC TUBAL LIGATION Bilateral 10/11/2016   Procedure: LAPAROSCOPIC TUBAL LIGATION WITH FILSHIE CLIPS;  Surgeon: Donnamae Jude, MD;  Location: [facility];  Service: Gynecology;  Laterality: Bilateral;  . MOUTH SURGERY    . ROBOTIC ASSISTED LAPAROSCOPIC OVARIAN CYSTECTOMY Left 05/04/2019   Procedure: XI ROBOTIC ASSISTED LAPAROSCOPIC OVARIAN CYSTECTOMY;  Surgeon:  Ward, Honor Loh, MD;  Location: [facility];  Service: Gynecology;  Laterality: Left;  . UMBILICAL HERNIA REPAIR N/A 07/11/2018   Procedure: LAPAROSCOPIC UMBILICAL HERNIA REPAIR;  Surgeon: Benjamine Sprague, DO;  Location: [facility];  Service: General;  Laterality: N/A;  . WISDOM TOOTH EXTRACTION       Vanessa Kick, MD 02/27/20 1102

## 2020-02-27 NOTE — ED Triage Notes (Signed)
Pt presents with uncontrollably itching all over her body from unknown source X 1 month ; pt states she has tried zyrtec and hydrocortisone with no relief.

## 2020-02-29 ENCOUNTER — Ambulatory Visit: Payer: Disability Insurance | Admitting: Family Medicine

## 2020-03-10 ENCOUNTER — Ambulatory Visit (INDEPENDENT_AMBULATORY_CARE_PROVIDER_SITE_OTHER): Payer: Disability Insurance | Admitting: Family Medicine

## 2020-03-10 ENCOUNTER — Other Ambulatory Visit: Payer: Self-pay

## 2020-03-10 ENCOUNTER — Encounter: Payer: Self-pay | Admitting: Family Medicine

## 2020-03-10 VITALS — BP 163/93 | HR 71 | Temp 97.6°F | Ht 67.0 in | Wt 169.8 lb

## 2020-03-10 DIAGNOSIS — L299 Pruritus, unspecified: Secondary | ICD-10-CM

## 2020-03-10 DIAGNOSIS — B20 Human immunodeficiency virus [HIV] disease: Secondary | ICD-10-CM | POA: Diagnosis not present

## 2020-03-10 DIAGNOSIS — I10 Essential (primary) hypertension: Secondary | ICD-10-CM | POA: Diagnosis not present

## 2020-03-10 DIAGNOSIS — K219 Gastro-esophageal reflux disease without esophagitis: Secondary | ICD-10-CM

## 2020-03-10 DIAGNOSIS — G43909 Migraine, unspecified, not intractable, without status migrainosus: Secondary | ICD-10-CM

## 2020-03-10 DIAGNOSIS — F319 Bipolar disorder, unspecified: Secondary | ICD-10-CM

## 2020-03-10 DIAGNOSIS — Z21 Asymptomatic human immunodeficiency virus [HIV] infection status: Secondary | ICD-10-CM

## 2020-03-10 DIAGNOSIS — Z09 Encounter for follow-up examination after completed treatment for conditions other than malignant neoplasm: Secondary | ICD-10-CM

## 2020-03-10 DIAGNOSIS — F419 Anxiety disorder, unspecified: Secondary | ICD-10-CM

## 2020-03-10 DIAGNOSIS — J45909 Unspecified asthma, uncomplicated: Secondary | ICD-10-CM

## 2020-03-10 LAB — POCT URINALYSIS DIPSTICK
Bilirubin, UA: NEGATIVE
Blood, UA: NEGATIVE
Glucose, UA: NEGATIVE
Ketones, UA: NEGATIVE
Leukocytes, UA: NEGATIVE
Nitrite, UA: NEGATIVE
Protein, UA: NEGATIVE
Spec Grav, UA: 1.02 (ref 1.010–1.025)
Urobilinogen, UA: 4 E.U./dL — AB
pH, UA: 7 (ref 5.0–8.0)

## 2020-03-10 LAB — POCT GLYCOSYLATED HEMOGLOBIN (HGB A1C)
HbA1c POC (<> result, manual entry): 4.9 % (ref 4.0–5.6)
HbA1c, POC (controlled diabetic range): 4.9 % (ref 0.0–7.0)
HbA1c, POC (prediabetic range): 4.9 % — AB (ref 5.7–6.4)
Hemoglobin A1C: 4.9 % (ref 4.0–5.6)

## 2020-03-10 LAB — GLUCOSE, POCT (MANUAL RESULT ENTRY): POC Glucose: 107 mg/dl — AB (ref 70–99)

## 2020-03-10 NOTE — Progress Notes (Signed)
[facility]   Established Patient Office Visit  Subjective:  Patient ID: Teresa [name], female    DOB: 1970/07/18  Age: 49 y.o. MRN: [id number]  CC:  Chief Complaint  Patient presents with  . Follow-up    MigrainsX20 yrs.  itching all over her bodyX    HPI Teresa [name] is a 49 year old female who presents for Follow Up today.    Patient Active Problem List   Diagnosis Date Noted  . Pruritus 03/11/2020  . Rash 09/18/2019  . Hypertensive urgency 05/24/2019  . Asthma   . Depression   . Pelvic pain in female 05/16/2019  . Arthralgia 04/02/2019  . HTN, goal below 130/80 06/05/2018  . Iron deficiency anemia due to chronic blood loss 03/25/2014  . Abnormal uterine bleeding (AUB) 03/12/2014  . Bipolar affective disorder ([place]) 04/13/2012  . Endometriosis 04/13/2012  . Migraine headache 04/13/2012  . Hepatitis C infection 03/10/2011  . HIV disease ([place]) 03/10/2011   Current Status: Since her last office visit, she has c/o chronic migraines, which she takes Imitrex with minimal relief. She continues to follow up with Dr. Quintella Reichert for Psychiatrist, who is prescribing medications as needed. She also follows up with Dr. [name] with Infection Disease. She denies fevers, chills, fatigue, recent infections, weight loss, and night sweats. Her blood pressures are elevate today, which she states that she has not been taking medications as prescribed. She has not had any headaches, visual changes, dizziness, and falls. No chest pain, heart palpitations, cough and shortness of breath reported. Denies GI problems such as nausea, vomiting, diarrhea, and constipation. She has no reports of blood in stools, dysuria and hematuria. No depression or anxiety, and denies suicidal ideations, homicidal ideations, or auditory hallucinations. She is taking all medications as prescribed. She denies pain today.   Past Medical History:  Diagnosis Date  .  Anemia    iron infusions x 2  . Anxiety   . Arthritis    Neck, left shoulder  . Asthma    uses inhaler 2-3 x week  . Bipolar disorder ([place])   . Depression   . Endometriosis   . Fibroids   . GERD (gastroesophageal reflux disease)   . Headache    Migraines  . Hepatitis 2009   Hep C  . HIV infection ([place])   . Hypertension   . Pancreatitis   . Pruritus 03/11/2020  . Rash 09/18/2019  . Seasonal allergies   . SVD (spontaneous vaginal delivery)    x 1    Past Surgical History:  Procedure Laterality Date  . ABDOMINAL HYSTERECTOMY    . CESAREAN SECTION     x2  . DILATION AND CURETTAGE OF UTERUS     x 2 - TAB  . HAND SURGERY Right    2 pins in wrist  . HYSTEROSCOPY N/A 10/11/2016   Procedure: HYSTEROSCOPY WITH HYDROTHERMAL ABLATION;  Surgeon: Donnamae Jude, MD;  Location: [facility];  Service: Gynecology;  Laterality: N/A;  Caryl Pina the HTA rep will be here.  Confirmed on 09/02/16.   Marland Kitchen LAPAROSCOPIC BILATERAL SALPINGECTOMY Bilateral 07/11/2018   Procedure: LAPAROSCOPIC BILATERAL SALPINGECTOMY;  Surgeon: Ward, Honor Loh, MD;  Location: [facility];  Service: Gynecology;  Laterality: Bilateral;  . LAPAROSCOPIC HYSTERECTOMY N/A 07/11/2018   Procedure: HYSTERECTOMY TOTAL LAPAROSCOPIC;  Surgeon: Ward, Honor Loh, MD;  Location: [facility];  Service: Gynecology;  Laterality: N/A;  . LAPAROSCOPIC TUBAL LIGATION Bilateral 10/11/2016  Procedure: LAPAROSCOPIC TUBAL LIGATION WITH FILSHIE CLIPS;  Surgeon: Donnamae Jude, MD;  Location: [facility];  Service: Gynecology;  Laterality: Bilateral;  . MOUTH SURGERY    . ROBOTIC ASSISTED LAPAROSCOPIC OVARIAN CYSTECTOMY Left 05/04/2019   Procedure: XI ROBOTIC ASSISTED LAPAROSCOPIC OVARIAN CYSTECTOMY;  Surgeon: Ward, Honor Loh, MD;  Location: [facility];  Service: Gynecology;  Laterality: Left;  . UMBILICAL HERNIA REPAIR N/A 07/11/2018   Procedure: LAPAROSCOPIC UMBILICAL HERNIA REPAIR;  Surgeon: Benjamine Sprague, DO;  Location: [facility];  Service: General;  Laterality: N/A;  .  WISDOM TOOTH EXTRACTION      Family History  Problem Relation Age of Onset  . Breast cancer Paternal Grandmother   . Breast cancer Cousin   . Hypertension Mother   . Lung cancer Father   . Diabetes Father     Social History   Socioeconomic History  . Marital status: Legally Separated    Spouse name: Not on file  . Number of children: Not on file  . Years of education: Not on file  . Highest education level: Not on file  Occupational History  . Not on file  Tobacco Use  . Smoking status: Former Smoker    Packs/day: 0.25    Years: 25.00    Pack years: 6.25    Types: Cigarettes  . Smokeless tobacco: Never Used  . Tobacco comment: uses vapor occasionally  Vaping Use  . Vaping Use: Never used  Substance and Sexual Activity  . Alcohol use: Yes    Comment: wine occ  . Drug use: Yes    Types: Marijuana    Comment: 2-3 x per month  . Sexual activity: Not Currently  Other Topics Concern  . Not on file  Social History Narrative  . Not on file   Social Determinants of Health   Financial Resource Strain:   . Difficulty of Paying Living Expenses: Not on file  Food Insecurity:   . Worried About Charity fundraiser in the Last Year: Not on file  . Ran Out of Food in the Last Year: Not on file  Transportation Needs:   . Lack of Transportation (Medical): Not on file  . Lack of Transportation (Non-Medical): Not on file  Physical Activity:   . Days of Exercise per Week: Not on file  . Minutes of Exercise per Session: Not on file  Stress:   . Feeling of Stress : Not on file  Social Connections:   . Frequency of Communication with Friends and Family: Not on file  . Frequency of Social Gatherings with Friends and Family: Not on file  . Attends Religious Services: Not on file  . Active Member of Clubs or Organizations: Not on file  . Attends Archivist Meetings: Not on file  . Marital Status: Not on file  Intimate Partner Violence:   . Fear of Current or  Ex-Partner: Not on file  . Emotionally Abused: Not on file  . Physically Abused: Not on file  . Sexually Abused: Not on file    Outpatient Medications Prior to Visit  Medication Sig Dispense Refill  . albuterol (PROAIR HFA) 108 (90 Base) MCG/ACT inhaler Inhale 2 puffs into the lungs every 6 (six) hours as needed for wheezing or shortness of breath.     Marland Kitchen buPROPion (WELLBUTRIN) 75 MG tablet Take 150 mg by mouth daily.     . cetirizine (ZYRTEC) 10 MG tablet Take 1 tablet (10 mg total) by mouth daily as needed for allergies. [place]  tablet 11  . diclofenac Sodium (VOLTAREN) 1 % GEL Apply 2 g topically 4 (four) times daily.     . Dolutegravir-lamiVUDine (DOVATO) 50-300 MG TABS Take 1 tablet by mouth daily. 30 tablet 11  . hydrochlorothiazide (HYDRODIURIL) 25 MG tablet Take 1 tablet (25 mg total) by mouth daily. 30 tablet 0  . hydrOXYzine (ATARAX/VISTARIL) 25 MG tablet Take 2 tablets (50 mg total) by mouth every 6 (six) hours. 30 tablet 1  . ondansetron (ZOFRAN-ODT) 4 MG disintegrating tablet Take 1 tablet (4 mg total) by mouth every 8 (eight) hours as needed for nausea or vomiting. 15 tablet 0  . sertraline (ZOLOFT) 100 MG tablet Take 150 mg by mouth at bedtime.     . triamcinolone ointment (KENALOG) 0.5 % APPLY TOPICALLY TO THE AFFECTED AREA TWICE DAILY 30 g 0  . Glycopyrronium Tosylate 2.4 % PADS Apply topically.    . verapamil (CALAN) 80 MG tablet Take 1 tablet (80 mg total) by mouth 3 (three) times daily. 90 tablet 0  . Galcanezumab-gnlm (EMGALITY) 120 MG/ML SOSY Inject 120 mg into the skin every 28 (twenty-eight) days. (Patient not taking: Reported on 03/10/2020)    . pantoprazole (PROTONIX) 40 MG tablet Take 40 mg by mouth daily. (Patient not taking: Reported on 03/10/2020)    . SUMAtriptan (IMITREX) 50 MG tablet Take 1 tablet (50 mg total) by mouth 2 (two) times daily as needed for migraine or headache. (Patient not taking: Reported on 03/10/2020) 10 tablet 5  . permethrin (ELIMITE) 5 % cream  Apply from neck down before bed then wash off in the morning. May repeat in one week. 60 g 1   No facility-administered medications prior to visit.    Allergies  Allergen Reactions  . Naproxen Sodium Hives    Can take ibuprofen without causing any hives    ROS Review of Systems  Constitutional: Negative.   HENT: Negative.   Eyes: Negative.   Respiratory: Negative.   Cardiovascular: Negative.   Gastrointestinal: Negative.   Endocrine: Negative.   Genitourinary: Negative.   Musculoskeletal: Negative.   Skin: Negative.   Allergic/Immunologic: Negative.   Neurological: Negative.   Hematological: Negative.   Psychiatric/Behavioral: Negative.    Objective:    Physical Exam Vitals and nursing note reviewed.  Constitutional:      Appearance: Normal appearance.  HENT:     Head: Normocephalic and atraumatic.     Nose: Nose normal.     Mouth/Throat:     Mouth: Mucous membranes are dry.     Pharynx: Oropharynx is clear.  Cardiovascular:     Rate and Rhythm: Normal rate and regular rhythm.     Pulses: Normal pulses.     Heart sounds: Normal heart sounds.  Pulmonary:     Effort: Pulmonary effort is normal.     Breath sounds: Normal breath sounds.  Abdominal:     General: Bowel sounds are normal.     Palpations: Abdomen is soft.  Musculoskeletal:        General: Normal range of motion.     Cervical back: Normal range of motion and neck supple.  Skin:    General: Skin is warm and dry.  Neurological:     General: No focal deficit present.     Mental Status: She is alert and oriented to person, place, and time.     Comments: migraines  Psychiatric:     Comments: anxious     BP (!) 163/93 (BP Location: Right Arm, Patient Position: Sitting,  Cuff Size: Normal)   Pulse 71   Temp 97.6 F (36.4 C)   Ht 5\' 7"  (1.702 m)   Wt 169 lb 12.8 oz (77 kg)   LMP 07/10/2018 (Exact Date)   BMI 26.59 kg/m  Wt Readings from Last 3 Encounters:  03/11/20 170 lb (77.1 kg)  03/10/20  169 lb 12.8 oz (77 kg)  01/28/20 161 lb 3.2 oz (73.1 kg)     Health Maintenance Due  Topic Date Due  . COVID-19 Vaccine (1) Never done  . PAP SMEAR-Modifier  Never done    There are no preventive care reminders to display for this patient.  Lab Results  Component Value Date   TSH 1.571 02/27/2020   Lab Results  Component Value Date   WBC 3.2 (L) 03/11/2020   HGB 14.0 03/11/2020   HCT 41.1 03/11/2020   MCV 88.8 03/11/2020   PLT 164 03/11/2020   Lab Results  Component Value Date   NA 138 03/11/2020   K 3.7 03/11/2020   CO2 26 03/11/2020   GLUCOSE 149 (H) 03/11/2020   BUN 8 03/11/2020   CREATININE 0.88 03/11/2020   BILITOT 0.8 03/11/2020   ALKPHOS 146 (H) 02/27/2020   AST 84 (H) 03/11/2020   ALT 56 (H) 03/11/2020   PROT 6.7 03/11/2020   ALBUMIN 3.1 (L) 02/27/2020   CALCIUM 8.9 03/11/2020   ANIONGAP 8 02/27/2020   Lab Results  Component Value Date   CHOL 151 03/11/2020   Lab Results  Component Value Date   HDL 30 (L) 03/11/2020   Lab Results  Component Value Date   LDLCALC 77 03/11/2020   Lab Results  Component Value Date   TRIG 369 (H) 03/11/2020   Lab Results  Component Value Date   CHOLHDL 5.0 (H) 03/11/2020   Lab Results  Component Value Date   HGBA1C 4.9 03/10/2020   HGBA1C 4.9 03/10/2020   HGBA1C 4.9 (A) 03/10/2020   HGBA1C 4.9 03/10/2020   Assessment & Plan:   1. Migraine without status migrainosus, not intractable, unspecified migraine type Stable today.  - Urinalysis Dipstick - POC Glucose (CBG) - POC HgB A1c - Ambulatory referral to Neurology  2. Hypertension, unspecified type The current medical regimen is effective; she will begin to take all medications as prescribed. She will continue to take medications as prescribed, to decrease high sodium intake, excessive alcohol intake, increase potassium intake, smoking cessation, and increase physical activity of at least 30 minutes of cardio activity daily. She will continue to follow  Heart Healthy or DASH diet.   3. Itching  4. HIV infection, unspecified symptom status ([place]) Continue to follow up with Infection Disease as needed.   5. Bipolar affective disorder, remission status unspecified ([place]) Stable today. Continue to follow up with Psychiatry as needed.   6. Follow up She will follow up in 1 month for blood pressure recheck.  She will follow up in 3 months for office visit.   No orders of the defined types were placed in this encounter.   Orders Placed This Encounter  Procedures  . Ambulatory referral to Neurology  . Urinalysis Dipstick  . POC Glucose (CBG)  . POC HgB A1c     Referral Orders     Ambulatory referral to Neurology   Kathe Becton,  MSN, FNP-BC [facility] [address] [place], [place] [address] [phone] fax   Problem List Items Addressed This Visit  Cardiovascular and Mediastinum   Migraine headache - Primary   Relevant Orders   Urinalysis Dipstick (Completed)   POC Glucose (CBG) (Completed)   POC HgB A1c (Completed)   Ambulatory referral to Neurology     Other   Bipolar affective disorder ([place])    Other Visit Diagnoses    Hypertension, unspecified type       Itching       HIV infection, unspecified symptom status ([place])       Follow up          No orders of the defined types were placed in this encounter.   Follow-up: No follow-ups on file.    Azzie Glatter, FNP

## 2020-03-11 ENCOUNTER — Encounter: Payer: Self-pay | Admitting: Infectious Disease

## 2020-03-11 ENCOUNTER — Ambulatory Visit (INDEPENDENT_AMBULATORY_CARE_PROVIDER_SITE_OTHER): Payer: Disability Insurance | Admitting: Infectious Disease

## 2020-03-11 VITALS — Wt 170.0 lb

## 2020-03-11 DIAGNOSIS — B182 Chronic viral hepatitis C: Secondary | ICD-10-CM

## 2020-03-11 DIAGNOSIS — F319 Bipolar disorder, unspecified: Secondary | ICD-10-CM

## 2020-03-11 DIAGNOSIS — B20 Human immunodeficiency virus [HIV] disease: Secondary | ICD-10-CM | POA: Diagnosis not present

## 2020-03-11 DIAGNOSIS — L299 Pruritus, unspecified: Secondary | ICD-10-CM

## 2020-03-11 DIAGNOSIS — R21 Rash and other nonspecific skin eruption: Secondary | ICD-10-CM

## 2020-03-11 DIAGNOSIS — I10 Essential (primary) hypertension: Secondary | ICD-10-CM

## 2020-03-11 HISTORY — DX: Pruritus, unspecified: L29.9

## 2020-03-11 NOTE — Telephone Encounter (Signed)
Was able to reach patient this morning regarding recent mychart message.  Patient was seen by PCP yesterday and states she feels like the Dovato is causing her to "itch". Patient is due for a follow up visit with Dr. [name], added to his open schedule today. Patient accepts appointment. Eugenia Mcalpine

## 2020-03-11 NOTE — Progress Notes (Signed)
Subjective:  Chief complaint intense pruritus  Patient ID: Teresa [name], female    DOB: 04-14-1971, 49 y.o.   MRN: [id number]  HPI  Teresa [name] is a 49 year old African-American lady living with HIV who was originally cared for at [facility] where she says she was on Isentress twice daily and Truvada.  She has been in the care of Dr. Adela Glimpse at [facility] and been on Triumeq with perfectly suppressed viral load and healthy CD4 count.  When I saw Teresa [name] last May she was already complaining of itchiness and I prescribed some topical corticosteroids for her.  At the time she was on Triumeq and I changed her to [facility] which is really not much of a change in medication is more of a removal of one in the sense that these are the same medications minus the abacavir.  She does have comorbid hepatitis C genotype 1a which is remained untreated and does have continued elevation in her liver function tests.  She had an elevated AFP as well.  She did have elastography done on 11 having difficulty interpreting her score.  She also had an MRI of the liver done which did not show any evidence of hepatic malignancy.  She continues to have trouble with itching and in fact was seen in the ER where she was given hydroxyzine as well as permethrin cream which she took two treatments of without success.  She is also seen PCP for this as well there does not appear to be any other changes in medications other than the changing her from Triumeq to [facility].  As mentioned the itching was preceding that change in any case.  I DO WONDER IF the PRURITIS is due to UNTREATED HCV infection.      Past Medical History:  Diagnosis Date  . Anemia    iron infusions x 2  . Anxiety   . Arthritis    Neck, left shoulder  . Asthma    uses inhaler 2-3 x week  . Bipolar disorder ([place])   . Depression   . Endometriosis   . Fibroids   . GERD (gastroesophageal reflux disease)   . Headache     Migraines  . Hepatitis 2009   Hep C  . HIV infection ([place])   . Hypertension   . Pancreatitis   . Pruritus 03/11/2020  . Rash 09/18/2019  . Seasonal allergies   . SVD (spontaneous vaginal delivery)    x 1    Past Surgical History:  Procedure Laterality Date  . ABDOMINAL HYSTERECTOMY    . CESAREAN SECTION     x2  . DILATION AND CURETTAGE OF UTERUS     x 2 - TAB  . HAND SURGERY Right    2 pins in wrist  . HYSTEROSCOPY N/A 10/11/2016   Procedure: HYSTEROSCOPY WITH HYDROTHERMAL ABLATION;  Surgeon: Donnamae Jude, MD;  Location: [facility];  Service: Gynecology;  Laterality: N/A;  Caryl Pina the HTA rep will be here.  Confirmed on 09/02/16.   Marland Kitchen LAPAROSCOPIC BILATERAL SALPINGECTOMY Bilateral 07/11/2018   Procedure: LAPAROSCOPIC BILATERAL SALPINGECTOMY;  Surgeon: Ward, Honor Loh, MD;  Location: [facility];  Service: Gynecology;  Laterality: Bilateral;  . LAPAROSCOPIC HYSTERECTOMY N/A 07/11/2018   Procedure: HYSTERECTOMY TOTAL LAPAROSCOPIC;  Surgeon: Ward, Honor Loh, MD;  Location: [facility];  Service: Gynecology;  Laterality: N/A;  . LAPAROSCOPIC TUBAL LIGATION Bilateral 10/11/2016   Procedure: LAPAROSCOPIC TUBAL LIGATION WITH FILSHIE CLIPS;  Surgeon: Donnamae Jude, MD;  Location: [facility];  Service: Gynecology;  Laterality: Bilateral;  . MOUTH SURGERY    . ROBOTIC ASSISTED LAPAROSCOPIC OVARIAN CYSTECTOMY Left 05/04/2019   Procedure: XI ROBOTIC ASSISTED LAPAROSCOPIC OVARIAN CYSTECTOMY;  Surgeon: Ward, Honor Loh, MD;  Location: [facility];  Service: Gynecology;  Laterality: Left;  . UMBILICAL HERNIA REPAIR N/A 07/11/2018   Procedure: LAPAROSCOPIC UMBILICAL HERNIA REPAIR;  Surgeon: Benjamine Sprague, DO;  Location: [facility];  Service: General;  Laterality: N/A;  . WISDOM TOOTH EXTRACTION      Family History  Problem Relation Age of Onset  . Breast cancer Paternal Grandmother   . Breast cancer Cousin   . Hypertension Mother   . Lung cancer Father   . Diabetes Father       Social History   Socioeconomic  History  . Marital status: Legally Separated    Spouse name: Not on file  . Number of children: Not on file  . Years of education: Not on file  . Highest education level: Not on file  Occupational History  . Not on file  Tobacco Use  . Smoking status: Former Smoker    Packs/day: 0.25    Years: 25.00    Pack years: 6.25    Types: Cigarettes  . Smokeless tobacco: Never Used  . Tobacco comment: uses vapor occasionally  Vaping Use  . Vaping Use: Never used  Substance and Sexual Activity  . Alcohol use: Yes    Comment: wine occ  . Drug use: Yes    Types: Marijuana    Comment: 2-3 x per month  . Sexual activity: Not Currently  Other Topics Concern  . Not on file  Social History Narrative  . Not on file   Social Determinants of Health   Financial Resource Strain:   . Difficulty of Paying Living Expenses: Not on file  Food Insecurity:   . Worried About Charity fundraiser in the Last Year: Not on file  . Ran Out of Food in the Last Year: Not on file  Transportation Needs:   . Lack of Transportation (Medical): Not on file  . Lack of Transportation (Non-Medical): Not on file  Physical Activity:   . Days of Exercise per Week: Not on file  . Minutes of Exercise per Session: Not on file  Stress:   . Feeling of Stress : Not on file  Social Connections:   . Frequency of Communication with Friends and Family: Not on file  . Frequency of Social Gatherings with Friends and Family: Not on file  . Attends Religious Services: Not on file  . Active Member of Clubs or Organizations: Not on file  . Attends Archivist Meetings: Not on file  . Marital Status: Not on file    Allergies  Allergen Reactions  . Naproxen Sodium Hives    Can take ibuprofen without causing any hives     Current Outpatient Medications:  .  albuterol (PROAIR HFA) 108 (90 Base) MCG/ACT inhaler, Inhale 2 puffs into the lungs every 6 (six) hours as needed for wheezing or shortness of breath. , Disp:  , Rfl:  .  buPROPion (WELLBUTRIN) 75 MG tablet, Take 150 mg by mouth daily. , Disp: , Rfl:  .  cetirizine (ZYRTEC) 10 MG tablet, Take 1 tablet (10 mg total) by mouth daily as needed for allergies., Disp: 30 tablet, Rfl: 11 .  diclofenac Sodium (VOLTAREN) 1 % GEL, Apply 2 g topically 4 (four) times daily. , Disp: , Rfl:  .  Dolutegravir-lamiVUDine (DOVATO) 50-300 MG  TABS, Take 1 tablet by mouth daily., Disp: 30 tablet, Rfl: 11 .  Galcanezumab-gnlm (EMGALITY) 120 MG/ML SOSY, Inject 120 mg into the skin every 28 (twenty-eight) days. (Patient not taking: Reported on 03/10/2020), Disp: , Rfl:  .  hydrochlorothiazide (HYDRODIURIL) 25 MG tablet, Take 1 tablet (25 mg total) by mouth daily., Disp: 30 tablet, Rfl: 0 .  hydrOXYzine (ATARAX/VISTARIL) 25 MG tablet, Take 2 tablets (50 mg total) by mouth every 6 (six) hours., Disp: 30 tablet, Rfl: 1 .  ondansetron (ZOFRAN-ODT) 4 MG disintegrating tablet, Take 1 tablet (4 mg total) by mouth every 8 (eight) hours as needed for nausea or vomiting., Disp: 15 tablet, Rfl: 0 .  pantoprazole (PROTONIX) 40 MG tablet, Take 40 mg by mouth daily. (Patient not taking: Reported on 03/10/2020), Disp: , Rfl:  .  sertraline (ZOLOFT) 100 MG tablet, Take 150 mg by mouth at bedtime. , Disp: , Rfl:  .  SUMAtriptan (IMITREX) 50 MG tablet, Take 1 tablet (50 mg total) by mouth 2 (two) times daily as needed for migraine or headache. (Patient not taking: Reported on 03/10/2020), Disp: 10 tablet, Rfl: 5 .  triamcinolone ointment (KENALOG) 0.5 %, APPLY TOPICALLY TO THE AFFECTED AREA TWICE DAILY, Disp: 30 g, Rfl: 0    Review of Systems  Constitutional: Negative for chills and fever.  HENT: Negative for congestion and sore throat.   Eyes: Negative for photophobia.  Respiratory: Negative for cough, shortness of breath and wheezing.   Cardiovascular: Negative for chest pain, palpitations and leg swelling.  Gastrointestinal: Negative for abdominal pain, blood in stool, constipation,  diarrhea, nausea and vomiting.  Genitourinary: Negative for dysuria, flank pain and hematuria.  Musculoskeletal: Negative for back pain and myalgias.  Skin: Negative for rash.  Neurological: Negative for dizziness, weakness and headaches.  Hematological: Does not bruise/bleed easily.  Psychiatric/Behavioral: Negative for suicidal ideas.       Objective:   Physical Exam Constitutional:      General: She is not in acute distress.    Appearance: She is well-developed. She is not diaphoretic.  HENT:     Head: Normocephalic and atraumatic.     Mouth/Throat:     Pharynx: No oropharyngeal exudate.  Eyes:     General: No scleral icterus.    Conjunctiva/sclera: Conjunctivae normal.  Cardiovascular:     Rate and Rhythm: Normal rate and regular rhythm.  Pulmonary:     Effort: Pulmonary effort is normal. No respiratory distress.     Breath sounds: No wheezing.  Abdominal:     General: There is no distension.  Musculoskeletal:        General: No tenderness.     Cervical back: Normal range of motion and neck supple.  Skin:    General: Skin is warm and dry.     Coloration: Skin is not pale.     Findings: No erythema or rash.  Neurological:     General: No focal deficit present.     Mental Status: She is alert and oriented to person, place, and time.     Motor: No abnormal muscle tone.     Coordination: Coordination normal.  Psychiatric:        Attention and Perception: Attention and perception normal.        Mood and Affect: Mood normal.        Speech: Speech normal.        Behavior: Behavior normal.        Thought Content: Thought content normal.  Cognition and Memory: Cognition and memory normal.        Judgment: Judgment normal.    Leg with area of "bruising" from itching:       Assessment & Plan:   Itching: I wonder if this is due to untreated HCV? I do NOT think related to her ARV. I have put in referral to allergist and we will also endeavor to treat her  HCV   HIV disease: check labs and continuee Dovato   Hepatitis C without hepatic coma: She is genotype one 1a so I am thinking Maviret or Epclusa would be options for her   Bipolar disorder and depression: needs to be in couselling

## 2020-03-12 LAB — T-HELPER CELL (CD4) - (RCID CLINIC ONLY)
CD4 % Helper T Cell: 48 % (ref 33–65)
CD4 T Cell Abs: 914 /uL (ref 400–1790)

## 2020-03-13 ENCOUNTER — Encounter: Payer: Self-pay | Admitting: Neurology

## 2020-03-13 NOTE — Telephone Encounter (Signed)
Patient has questions regarding her lab results and their effect/result of her current health issues. Please advise. Landis Gandy, RN

## 2020-03-14 ENCOUNTER — Telehealth: Payer: Self-pay

## 2020-03-14 NOTE — Telephone Encounter (Signed)
[facility] Patient Teacher, English as a foreign language completed.    The patient is insured through [place].  Patient insurance will pay for [place] , but will need a PA.  We will continue to follow to see if copay assistance is needed.  [name], [facility] Patient [facility] for Infectious Disease Phone: [phone] Fax:  [phone]

## 2020-03-16 ENCOUNTER — Encounter: Payer: Self-pay | Admitting: Family Medicine

## 2020-03-16 MED ORDER — HYDROCHLOROTHIAZIDE 25 MG PO TABS: 25.0000 mg | ORAL_TABLET | Freq: Every day | ORAL | 3 refills | Status: AC

## 2020-03-16 MED ORDER — PANTOPRAZOLE SODIUM 40 MG PO TBEC: 40.0000 mg | DELAYED_RELEASE_TABLET | Freq: Every day | ORAL | 3 refills | Status: DC

## 2020-03-16 MED ORDER — ALBUTEROL SULFATE HFA 108 (90 BASE) MCG/ACT IN AERS: 2.0000 | INHALATION_SPRAY | Freq: Four times a day (QID) | RESPIRATORY_TRACT | 11 refills | Status: DC | PRN

## 2020-03-16 MED ORDER — HYDROXYZINE HCL 25 MG PO TABS: 50.0000 mg | ORAL_TABLET | Freq: Three times a day (TID) | ORAL | 3 refills | Status: AC | PRN

## 2020-03-16 MED ORDER — SERTRALINE HCL 100 MG PO TABS: 150.0000 mg | ORAL_TABLET | Freq: Every day | ORAL | 3 refills | Status: AC

## 2020-03-18 LAB — CBC WITH DIFFERENTIAL/PLATELET
Absolute Monocytes: 198 cells/uL — ABNORMAL LOW (ref 200–950)
Basophils Absolute: 38 cells/uL (ref 0–200)
Basophils Relative: 1.2 %
Eosinophils Absolute: 80 cells/uL (ref 15–500)
Eosinophils Relative: 2.5 %
HCT: 41.1 % (ref 35.0–45.0)
Hemoglobin: 14 g/dL (ref 11.7–15.5)
Lymphs Abs: 2016 cells/uL (ref 850–3900)
MCH: 30.2 pg (ref 27.0–33.0)
MCHC: 34.1 g/dL (ref 32.0–36.0)
MCV: 88.8 fL (ref 80.0–100.0)
MPV: 12.6 fL — ABNORMAL HIGH (ref 7.5–12.5)
Monocytes Relative: 6.2 %
Neutro Abs: 867 cells/uL — ABNORMAL LOW (ref 1500–7800)
Neutrophils Relative %: 27.1 %
Platelets: 164 10*3/uL (ref 140–400)
RBC: 4.63 10*6/uL (ref 3.80–5.10)
RDW: 11.8 % (ref 11.0–15.0)
Total Lymphocyte: 63 %
WBC: 3.2 10*3/uL — ABNORMAL LOW (ref 3.8–10.8)

## 2020-03-18 LAB — COMPLETE METABOLIC PANEL WITH GFR
AG Ratio: 1.2 (calc) (ref 1.0–2.5)
ALT: 56 U/L — ABNORMAL HIGH (ref 6–29)
AST: 84 U/L — ABNORMAL HIGH (ref 10–35)
Albumin: 3.6 g/dL (ref 3.6–5.1)
Alkaline phosphatase (APISO): 146 U/L — ABNORMAL HIGH (ref 31–125)
BUN: 8 mg/dL (ref 7–25)
CO2: 26 mmol/L (ref 20–32)
Calcium: 8.9 mg/dL (ref 8.6–10.2)
Chloride: 103 mmol/L (ref 98–110)
Creat: 0.88 mg/dL (ref 0.50–1.10)
GFR, Est African American: 91 mL/min/{1.73_m2} (ref >=60)
GFR, Est Non African American: 79 mL/min/{1.73_m2} (ref >=60)
Globulin: 3.1 g/dL (calc) (ref 1.9–3.7)
Glucose, Bld: 149 mg/dL — ABNORMAL HIGH (ref 65–99)
Potassium: 3.7 mmol/L (ref 3.5–5.3)
Sodium: 138 mmol/L (ref 135–146)
Total Bilirubin: 0.8 mg/dL (ref 0.2–1.2)
Total Protein: 6.7 g/dL (ref 6.1–8.1)

## 2020-03-18 LAB — LIPID PANEL
Cholesterol: 151 mg/dL (ref <200)
HDL: 30 mg/dL — ABNORMAL LOW (ref >=50)
LDL Cholesterol (Calc): 77 mg/dL (calc)
Non-HDL Cholesterol (Calc): 121 mg/dL (calc) (ref <130)
Total CHOL/HDL Ratio: 5 (calc) — ABNORMAL HIGH (ref <5.0)
Triglycerides: 369 mg/dL — ABNORMAL HIGH (ref <150)

## 2020-03-18 LAB — HIV-1 RNA QUANT-NO REFLEX-BLD
HIV 1 RNA Quant: <20 Copies/mL
HIV-1 RNA Quant, Log: <1.3 Log cps/mL

## 2020-03-18 LAB — HEPATITIS C RNA QUANTITATIVE
HCV RNA, PCR, QN (Log): 7.19 log IU/mL — ABNORMAL HIGH
HCV RNA, PCR, QN: 15600000 IU/mL — ABNORMAL HIGH

## 2020-03-18 LAB — RPR: RPR Ser Ql: NONREACTIVE

## 2020-03-18 LAB — CRYOGLOBULIN: Cryoglobulin, Qualitative Analysis: NOT DETECTED

## 2020-03-24 ENCOUNTER — Other Ambulatory Visit: Payer: Self-pay | Admitting: Infectious Disease

## 2020-03-24 ENCOUNTER — Encounter: Payer: Self-pay | Admitting: Infectious Disease

## 2020-03-24 ENCOUNTER — Ambulatory Visit (INDEPENDENT_AMBULATORY_CARE_PROVIDER_SITE_OTHER): Payer: Medicaid Other | Admitting: Infectious Disease

## 2020-03-24 ENCOUNTER — Telehealth: Payer: Self-pay

## 2020-03-24 ENCOUNTER — Telehealth: Payer: Self-pay | Admitting: Pharmacist

## 2020-03-24 ENCOUNTER — Other Ambulatory Visit: Payer: Self-pay

## 2020-03-24 VITALS — BP 132/88 | HR 79 | Temp 97.7°F | Resp 16 | Ht 73.0 in | Wt 173.0 lb

## 2020-03-24 DIAGNOSIS — B20 Human immunodeficiency virus [HIV] disease: Secondary | ICD-10-CM | POA: Diagnosis not present

## 2020-03-24 DIAGNOSIS — N809 Endometriosis, unspecified: Secondary | ICD-10-CM | POA: Diagnosis not present

## 2020-03-24 DIAGNOSIS — B182 Chronic viral hepatitis C: Secondary | ICD-10-CM | POA: Diagnosis not present

## 2020-03-24 DIAGNOSIS — R772 Abnormality of alphafetoprotein: Secondary | ICD-10-CM | POA: Diagnosis not present

## 2020-03-24 DIAGNOSIS — N83202 Unspecified ovarian cyst, left side: Secondary | ICD-10-CM | POA: Diagnosis not present

## 2020-03-24 DIAGNOSIS — N83209 Unspecified ovarian cyst, unspecified side: Secondary | ICD-10-CM | POA: Insufficient documentation

## 2020-03-24 HISTORY — DX: Unspecified ovarian cyst, unspecified side: N83.209

## 2020-03-24 MED ORDER — MAVYRET 100-40 MG PO TABS: ORAL_TABLET | ORAL | 1 refills | Status: DC

## 2020-03-24 NOTE — Telephone Encounter (Signed)
[facility] Patient Advocate Encounter   Received notification from [place] that prior authorization for MAVYRET is required.   PA submitted on 03/24/20 [place] [id number] Status is pending    [facility] will continue to follow.   [name], [facility] Patient [facility] for Infectious Disease Phone: [phone] Fax:  [phone]

## 2020-03-24 NOTE — Progress Notes (Signed)
Subjective:  Chief complaint intense pruritus, also having headaches  Patient ID: Teresa [name], female    DOB: [birth year], 49 y.o.   MRN: [id number]  HPI  Teresa [name] is a 49 year old African-American lady living with HIV who was originally cared for at [facility] where she says she was on Isentress twice daily and Truvada.  She has been in the care of Dr. Adela Glimpse at [facility] and been on Triumeq with perfectly suppressed viral load and healthy CD4 count.  When I saw Teresa [name] last May she was already complaining of itchiness and I prescribed some topical corticosteroids for her.  At the time she was on Triumeq and I changed her to [facility] which is really not much of a change in medication is more of a removal of one in the sense that these are the same medications minus the abacavir.  She does have comorbid hepatitis C genotype 1a which is remained untreated and does have continued elevation in her liver function tests.  She had an elevated AFP as well.  She did have elastography done on 11 having difficulty interpreting her score.  She also had an MRI of the liver done which did not show any evidence of hepatic malignancy.  She continues to have trouble with itching and in fact was seen in the ER where she was given hydroxyzine as well as permethrin cream which she took two treatments of without success.  She is also seen PCP for this as well there does not appear to be any other changes in medications other than the changing her from Triumeq to [facility].  As mentioned the itching was preceding that change in any case.  I DO WONDER IF the PRURITIS is due to UNTREATED HCV infection though she does not have elevated bilirubin which would be more typical  Her HIV is perfectly controlled.  I was worried about her AFB being elevated but I understand this could be due to her HCV itself.  She has had TAH and has had oopherectomy left side. THere were some cystic changes  seen on right ovary in January of 2021 on CT. She has not seen Ob/gyn recently.    Past Medical History:  Diagnosis Date  . Anemia    iron infusions x 2  . Anxiety   . Arthritis    Neck, left shoulder  . Asthma    uses inhaler 2-3 x week  . Bipolar disorder ([place])   . Depression   . Endometriosis   . Fibroids   . GERD (gastroesophageal reflux disease)   . Headache    Migraines  . Hepatitis 2009   Hep C  . HIV infection ([place])   . Hypertension   . Pancreatitis   . Pruritus 03/11/2020  . Rash 09/18/2019  . Seasonal allergies   . SVD (spontaneous vaginal delivery)    x 1    Past Surgical History:  Procedure Laterality Date  . ABDOMINAL HYSTERECTOMY    . CESAREAN SECTION     x2  . DILATION AND CURETTAGE OF UTERUS     x 2 - TAB  . HAND SURGERY Right    2 pins in wrist  . HYSTEROSCOPY N/A 10/11/2016   Procedure: HYSTEROSCOPY WITH HYDROTHERMAL ABLATION;  Surgeon: Donnamae Jude, MD;  Location: [facility];  Service: Gynecology;  Laterality: N/A;  Caryl Pina the HTA rep will be here.  Confirmed on 09/02/16.   Marland Kitchen LAPAROSCOPIC BILATERAL SALPINGECTOMY Bilateral 07/11/2018   Procedure: LAPAROSCOPIC BILATERAL SALPINGECTOMY;  Surgeon: Ward, Honor Loh, MD;  Location: [facility];  Service: Gynecology;  Laterality: Bilateral;  . LAPAROSCOPIC HYSTERECTOMY N/A 07/11/2018   Procedure: HYSTERECTOMY TOTAL LAPAROSCOPIC;  Surgeon: Ward, Honor Loh, MD;  Location: [facility];  Service: Gynecology;  Laterality: N/A;  . LAPAROSCOPIC TUBAL LIGATION Bilateral 10/11/2016   Procedure: LAPAROSCOPIC TUBAL LIGATION WITH FILSHIE CLIPS;  Surgeon: Donnamae Jude, MD;  Location: [facility];  Service: Gynecology;  Laterality: Bilateral;  . MOUTH SURGERY    . ROBOTIC ASSISTED LAPAROSCOPIC OVARIAN CYSTECTOMY Left 05/04/2019   Procedure: XI ROBOTIC ASSISTED LAPAROSCOPIC OVARIAN CYSTECTOMY;  Surgeon: Ward, Honor Loh, MD;  Location: [facility];  Service: Gynecology;  Laterality: Left;  . UMBILICAL HERNIA REPAIR N/A 07/11/2018    Procedure: LAPAROSCOPIC UMBILICAL HERNIA REPAIR;  Surgeon: Benjamine Sprague, DO;  Location: [facility];  Service: General;  Laterality: N/A;  . WISDOM TOOTH EXTRACTION      Family History  Problem Relation Age of Onset  . Breast cancer Paternal Grandmother   . Breast cancer Cousin   . Hypertension Mother   . Lung cancer Father   . Diabetes Father       Social History   Socioeconomic History  . Marital status: Legally Separated    Spouse name: Not on file  . Number of children: Not on file  . Years of education: Not on file  . Highest education level: Not on file  Occupational History  . Not on file  Tobacco Use  . Smoking status: Former Smoker    Packs/day: 0.25    Years: 25.00    Pack years: 6.25    Types: Cigarettes  . Smokeless tobacco: Never Used  . Tobacco comment: uses vapor occasionally  Vaping Use  . Vaping Use: Never used  Substance and Sexual Activity  . Alcohol use: Yes    Comment: wine occ  . Drug use: Yes    Types: Marijuana    Comment: 2-3 x per month  . Sexual activity: Not Currently  Other Topics Concern  . Not on file  Social History Narrative  . Not on file   Social Determinants of Health   Financial Resource Strain:   . Difficulty of Paying Living Expenses: Not on file  Food Insecurity:   . Worried About Charity fundraiser in the Last Year: Not on file  . Ran Out of Food in the Last Year: Not on file  Transportation Needs:   . Lack of Transportation (Medical): Not on file  . Lack of Transportation (Non-Medical): Not on file  Physical Activity:   . Days of Exercise per Week: Not on file  . Minutes of Exercise per Session: Not on file  Stress:   . Feeling of Stress : Not on file  Social Connections:   . Frequency of Communication with Friends and Family: Not on file  . Frequency of Social Gatherings with Friends and Family: Not on file  . Attends Religious Services: Not on file  . Active Member of Clubs or Organizations: Not on file  .  Attends Archivist Meetings: Not on file  . Marital Status: Not on file    Allergies  Allergen Reactions  . Naproxen Sodium Hives    Can take ibuprofen without causing any hives     Current Outpatient Medications:  .  albuterol (PROAIR HFA) 108 (90 Base) MCG/ACT inhaler, Inhale 2 puffs into the lungs every 6 (six) hours as needed for wheezing or shortness of breath., Disp: 8 g, Rfl: 11 .  buPROPion (WELLBUTRIN) 75 MG tablet, Take 150 mg by mouth daily. , Disp: , Rfl:  .  cetirizine (ZYRTEC) 10 MG tablet, Take 1 tablet (10 mg total) by mouth daily as needed for allergies., Disp: 30 tablet, Rfl: 11 .  diclofenac Sodium (VOLTAREN) 1 % GEL, Apply 2 g topically 4 (four) times daily. , Disp: , Rfl:  .  Galcanezumab-gnlm (EMGALITY) 120 MG/ML SOSY, Inject 120 mg into the skin every 28 (twenty-eight) days. , Disp: , Rfl:  .  hydrochlorothiazide (HYDRODIURIL) 25 MG tablet, Take 1 tablet (25 mg total) by mouth daily., Disp: 90 tablet, Rfl: 3 .  hydrOXYzine (ATARAX/VISTARIL) 25 MG tablet, Take 2 tablets (50 mg total) by mouth every 8 (eight) hours as needed., Disp: 90 tablet, Rfl: 3 .  ondansetron (ZOFRAN-ODT) 4 MG disintegrating tablet, Take 1 tablet (4 mg total) by mouth every 8 (eight) hours as needed for nausea or vomiting., Disp: 15 tablet, Rfl: 0 .  pantoprazole (PROTONIX) 40 MG tablet, Take 1 tablet (40 mg total) by mouth daily., Disp: 90 tablet, Rfl: 3 .  sertraline (ZOLOFT) 100 MG tablet, Take 1.5 tablets (150 mg total) by mouth at bedtime., Disp: 90 tablet, Rfl: 3 .  SUMAtriptan (IMITREX) 50 MG tablet, Take 1 tablet (50 mg total) by mouth 2 (two) times daily as needed for migraine or headache., Disp: 10 tablet, Rfl: 5 .  triamcinolone ointment (KENALOG) 0.5 %, APPLY TOPICALLY TO THE AFFECTED AREA TWICE DAILY, Disp: 30 g, Rfl: 0 .  TRIUMEQ 600-50-300 MG tablet, Take 1 tablet by mouth daily., Disp: , Rfl:     Review of Systems  Constitutional: Negative for chills and fever.  HENT:  Negative for congestion and sore throat.   Eyes: Negative for photophobia.  Respiratory: Negative for cough, shortness of breath and wheezing.   Cardiovascular: Negative for chest pain, palpitations and leg swelling.  Gastrointestinal: Negative for abdominal pain, blood in stool, constipation, diarrhea, nausea and vomiting.  Genitourinary: Negative for dysuria, flank pain and hematuria.  Musculoskeletal: Negative for back pain and myalgias.  Skin: Negative for rash.  Neurological: Positive for headaches. Negative for dizziness and weakness.  Hematological: Does not bruise/bleed easily.  Psychiatric/Behavioral: Negative for agitation and suicidal ideas.       Objective:   Physical Exam Constitutional:      General: She is not in acute distress.    Appearance: She is well-developed. She is not diaphoretic.  HENT:     Head: Normocephalic and atraumatic.     Mouth/Throat:     Pharynx: No oropharyngeal exudate.  Eyes:     General: No scleral icterus.    Conjunctiva/sclera: Conjunctivae normal.  Cardiovascular:     Rate and Rhythm: Normal rate and regular rhythm.  Pulmonary:     Effort: Pulmonary effort is normal. No respiratory distress.     Breath sounds: No wheezing.  Abdominal:     General: There is no distension.  Musculoskeletal:        General: No tenderness. Normal range of motion.     Cervical back: Normal range of motion and neck supple.  Skin:    General: Skin is warm and dry.     Coloration: Skin is not pale.     Findings: No erythema or rash.  Neurological:     General: No focal deficit present.     Mental Status: She is alert and oriented to person, place, and time.     Motor: No abnormal muscle tone.     Coordination:  Coordination normal.  Psychiatric:        Attention and Perception: Attention and perception normal.        Mood and Affect: Mood normal.        Speech: Speech normal.        Behavior: Behavior normal.        Thought Content: Thought content  normal.        Cognition and Memory: Cognition and memory normal.        Judgment: Judgment normal.    Leg with area of "bruising" from itching:       Assessment & Plan:   Itching: I wonder if this is due to untreated HCV? I do NOT think related to her ARV. I have put in referral to allergist and we will also endeavor to treat her HCV   HIV disease:  continuee Dovato   Hepatitis C without hepatic coma: She is genotype one 1a so I am starting Mavyret 3 tablets with food daily x 8 weeks  Will check HCV VL in 4 weeks roughly  Elevated AFP: will repeat, likely is due to her HCV but will also check CA 125 as well given abnormalities on CT scan, pruritis other oddities to her story. May need repeat abdominal imaging of ovaries and have asked her to get reconnected to Ob/gyn.

## 2020-03-24 NOTE — Telephone Encounter (Signed)
Patient is approved to receive Mavyret x 8 weeks for chronic Hepatitis C infection. Counseled patient to take all three tablets of Mavyret daily with food.  Counseled patient the need to take all three tablets together and to not separate them out during the day. Encouraged patient not to miss any doses and explained how their chance of cure could go down with each dose missed. Counseled patient on what to do if dose is missed - if it is closer to the missed dose take immediately; if closer to next dose then skip dose and take the next dose at the usual time.   Counseled patient on common side effects such as headache, fatigue, and nausea and that these normally decrease with time. I reviewed patient medications and found no drug interactions. Discussed with patient that there are several drug interactions with Mavyret and instructed patient to call the clinic if she wishes to start a new medication during course of therapy. Also advised patient to call if she experiences any side effects. Patient will follow-up with me in the pharmacy clinic 4 weeks after starting therapy.

## 2020-03-25 ENCOUNTER — Telehealth: Payer: Self-pay

## 2020-03-25 LAB — AFP TUMOR MARKER: AFP-Tumor Marker: 202.2 ng/mL — ABNORMAL HIGH

## 2020-03-25 LAB — CA 125: CA 125: 8 U/mL (ref <35)

## 2020-03-25 MED FILL — MAVYRET 100-40 MG TABS: 100-40 | 28 days supply | Qty: 84 | Fill #0

## 2020-03-25 NOTE — Telephone Encounter (Signed)
[facility] Patient Advocate Encounter  Prior Authorization for MAVYRET has been approved.    PA# [id number] Effective dates: 03/25/20 through 05/20/20  Patients co-pay is $3.00.   [facility] will continue to follow.  [name], [facility] Patient [facility] for Infectious Disease Phone: [phone] Fax:  [phone]

## 2020-03-26 ENCOUNTER — Other Ambulatory Visit: Payer: Self-pay | Admitting: Infectious Disease

## 2020-03-26 DIAGNOSIS — R772 Abnormality of alphafetoprotein: Secondary | ICD-10-CM

## 2020-03-26 DIAGNOSIS — B182 Chronic viral hepatitis C: Secondary | ICD-10-CM

## 2020-03-26 NOTE — Progress Notes (Signed)
t

## 2020-03-27 ENCOUNTER — Telehealth: Payer: Self-pay | Admitting: Pharmacist

## 2020-03-27 ENCOUNTER — Other Ambulatory Visit: Payer: Self-pay

## 2020-03-27 ENCOUNTER — Telehealth: Payer: Self-pay

## 2020-03-27 MED ORDER — DOVATO 50-300 MG PO TABS
1.0000 | ORAL_TABLET | Freq: Every day | ORAL | 5 refills | Status: DC
Start: 2020-03-27 — End: 2020-09-25

## 2020-03-27 NOTE — Telephone Encounter (Signed)
Patient was set to start [place] for her Hepatitis C today. Called and spoke with her and told her to hold off until she got the MRI that Dr. [name] ordered for her. I told her someone would reach out to schedule that appointment and to hold off on starting until she heard back from Dr. [name] or myself. She verbalized understanding and will hold off until she hears otherwise.

## 2020-03-27 NOTE — Telephone Encounter (Signed)
-----   Message from [name], MD sent at 03/26/2020  9:32 AM EST ----- Regarding: RE: ? what to do with AFP marker Thanks to both of you. I am ordering MRI liver today and [name] I have put in referral for her to see you as well. My staff can we let [name] know about MRI liver and referral to endocrine. [name] is going to have her hold of on starting her HCV treatment until we know if she has liver cancer (I certainly hope not and last MRI normal) ----- Message ----- From: [name], MD Sent: 03/26/2020   8:16 AM EST To: Renato Shin, MD, [name], MD Subject: RE: ? what to do with AFP marker               I would repeat an MRI of the liver.  Need to look for development of hepatocellular carcinoma.  If nothing present would follow it as I think it could come from the liver disease itself but the rise is concerning.   [name] ----- Message ----- From: [name], [name], MD Sent: 03/25/2020   3:32 PM EST To: Renato Shin, MD, [name], MD Subject: ? what to do with AFP marker                   [name] and [name] Dr [name] got AFP in this patient more than a year ago which was elevated. MRI liver without HCC. I repeated it now along with CA 125 (which was normal). I am not sure what to do in terms of further workup of this lab. Could this ONLY be due to her untreated HCV (we just started to treat (sent meds in yesterday) ----- Message ----- From: [name] Lab Results In Sent: 03/25/2020   8:44 AM EST To: [name], MD

## 2020-03-27 NOTE — Telephone Encounter (Signed)
CMA will forward message to referral coordinator who will schedule MRIl.

## 2020-04-01 NOTE — Progress Notes (Signed)
New Patient Note  RE: Teresa [name] MRN: [id number] DOB: Oct 16, 1970 Date of Office Visit: 04/02/2020  Referring provider: [name], [name], MD Primary care provider: Azzie Glatter, FNP  Chief Complaint: Pruritus, Allergic Rhinitis , and Eczema  History of Present Illness: I had the pleasure of seeing Teresa [name] for initial evaluation at the [facility] on 04/02/2020. She is a 49 y.o. female, who is referred here by ID (Dr. [name]) for the evaluation of pruritus.  Patient started having pruritus on the back then it gradually spread to other parts of her body for the past 5 months. Denies any associated rash but noticing some bruising at the areas of scratching for the past 3 months as well.   Associated symptoms include: none. Suspected triggers are unknown. Denies any fevers, chills, changes in medications, foods, personal care products or recent infections. She has tried the following therapies: triamcinolone cream, hydrocortisone cream, hydroxyzine with no benefit. Systemic steroids not sure. Currently on hydroxyzine 25mg  BID, and zyrtec 10mg  BID.  Currently moisturizing with Vaseline, Cerave.   Previous work up includes: patient follows with ID - hepatitis C, HIV. Previous history of rash/hives: no. Patient is up to date with the following cancer screening tests: physical exam, pap smears, due for mammogram.  03/11/2020 ID visit: "Itching: I wonder if this is due to untreated HCV? I do NOT think related to her ARV. 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 female with: Pruritus Whole body pruritus for 5 months. No associated rash but sometimes  has bruising. Tried topical steroid cream, hydroxyzine, zyrtec, permethrin with minimal benefit. Past medical history significant for controlled HIV and not treated hepatitis C. Denies changes in medications, diet. She did change some of her personal care products.  Discussed with patient that the differential diagnosis for pruritus is vast however, given her history, concerned if the hepatitis is the main trigger. Not likely to be triggered by food or environmental allergies.  Continue to follow up with ID for HIV and Hepatitis C.  Start proper skin care - use dye free and fragrance products.   Do not use fabric softeners or dryer sheets.  Moisturize throughout day - do not use tea tree oil. Get bloodwork as below to rule out other etiologies.   Continue with taking zyrtec (cetirizine) 10mg  twice a day.  May take hydroxyzine 25mg  1 tablet every 8 hours if needed for itching.  This may make you drowsy.   Other allergic rhinitis Perennial rhino conjunctivitis symptoms which flares during the spring.   Continue with zyrtec as above.  Check environmental allergies via bloodwork and will make additional recommendations based on results.  Shortness of breath Significant tobacco exposure history and used to be on Spiriva. Now having issues with shortness of breath and wheezing in the mornings and at night for the past 1.5 months.   Today's spirometry showed: normal pattern with no improvement in FEV1 post bronchodilator treatment. Clinically feeling the same. Patient used albuterol 1-2 hours before visit. . Daily controller medication(s): start Breo 135mcg 1 puff daily given daily symptoms and rinse mouth after each use.  Sample given and demonstrated proper use.  . May use albuterol rescue inhaler 2 puffs every 4 to 6 hours as needed for shortness of breath, chest tightness, coughing, and wheezing. May use albuterol rescue inhaler 2 puffs 5 to 15 minutes prior to strenuous physical  activities. Monitor frequency of use.  . Repeat spirometry at next visit.  Return in about 2 months (around 06/03/2020).  Meds ordered this encounter  Medications  . fluticasone furoate-vilanterol (BREO ELLIPTA) 100-25 MCG/INH AEPB    Sig: Inhale 1 puff into the lungs daily. Rinse mouth after each use.    Dispense:  60 each    Refill:  5  . cetirizine (ZYRTEC) 10 MG tablet    Sig: Take 1 tablet (10 mg total) by mouth daily as needed for allergies.    Dispense:  90 tablet    Refill:  3    Lab Orders     Tryptase     Allergens w/Total IgE Area 2     Alpha-Gal Panel     ANA w/Reflex  Other allergy screening: Asthma:  Uses albuterol in the mornings and at night due to shortness of breath and wheezing for the past 1.5 months. Patient used to be on Spiriva.   Rhino conjunctivitis: yes  Rhino conjunctivitis symptoms usually in the spring but now having perennial symptoms.  Food allergy: no Medication allergy: yes  Naproxen causes rash Hymenoptera allergy: no Urticaria: no Eczema:no  Diagnostics: Spirometry:  Took albuterol at 9:15AM Tracings reviewed. Her effort: Good reproducible efforts. FVC: 3.07L FEV1: 2.51L, 94% predicted FEV1/FVC ratio: 82% Interpretation: Spirometry consistent with normal pattern with no improvement in FEV1 post bronchodilator treatment. Clinically feeling the same.  Please see scanned spirometry results for details.  Past Medical History: Patient Active Problem List   Diagnosis Date Noted  . Other allergic rhinitis 04/02/2020  . Shortness of breath 04/02/2020  . Ovarian cyst 03/24/2020  . Pruritus 03/11/2020  . Rash 09/18/2019  . Hypertensive urgency 05/24/2019  . Asthma   . Depression   . Pelvic pain in female 05/16/2019  . Arthralgia 04/02/2019  . HTN, goal below 130/80 06/05/2018  . Iron deficiency anemia due to chronic blood loss 03/25/2014  . Abnormal uterine bleeding (AUB) 03/12/2014  . Bipolar affective disorder ([place]) 04/13/2012   . Endometriosis 04/13/2012  . Migraine headache 04/13/2012  . Hepatitis C infection 03/10/2011  . HIV disease ([place]) 03/10/2011   Past Medical History:  Diagnosis Date  . Anemia    iron infusions x 2  . Anxiety   . Arthritis    Neck, left shoulder  . Asthma    uses inhaler 2-3 x week  . Bipolar disorder ([place])   . Depression   . Eczema   . Endometriosis   . Fibroids   . GERD (gastroesophageal reflux disease)   . Headache    Migraines  . Hepatitis 2009   Hep C  . HIV infection ([place])   . Hypertension   . Ovarian cyst 03/24/2020  . Pancreatitis   . Pruritus 03/11/2020  . Rash 09/18/2019  . Seasonal allergies   . SVD (spontaneous vaginal delivery)    x 1   Past Surgical History: Past Surgical History:  Procedure Laterality Date  . ABDOMINAL HYSTERECTOMY    . CESAREAN SECTION     x2  . DILATION AND CURETTAGE OF UTERUS     x 2 - TAB  . HAND SURGERY Right    2 pins in wrist  . HYSTEROSCOPY N/A  10/11/2016   Procedure: HYSTEROSCOPY WITH HYDROTHERMAL ABLATION;  Surgeon: Donnamae Jude, MD;  Location: [facility];  Service: Gynecology;  Laterality: N/A;  Caryl Pina the HTA rep will be here.  Confirmed on 09/02/16.   Marland Kitchen LAPAROSCOPIC BILATERAL SALPINGECTOMY Bilateral 07/11/2018   Procedure: LAPAROSCOPIC BILATERAL SALPINGECTOMY;  Surgeon: Ward, Honor Loh, MD;  Location: [facility];  Service: Gynecology;  Laterality: Bilateral;  . LAPAROSCOPIC HYSTERECTOMY N/A 07/11/2018   Procedure: HYSTERECTOMY TOTAL LAPAROSCOPIC;  Surgeon: Ward, Honor Loh, MD;  Location: [facility];  Service: Gynecology;  Laterality: N/A;  . LAPAROSCOPIC TUBAL LIGATION Bilateral 10/11/2016   Procedure: LAPAROSCOPIC TUBAL LIGATION WITH FILSHIE CLIPS;  Surgeon: Donnamae Jude, MD;  Location: [facility];  Service: Gynecology;  Laterality: Bilateral;  . MOUTH SURGERY    . ROBOTIC ASSISTED LAPAROSCOPIC OVARIAN CYSTECTOMY Left 05/04/2019   Procedure: XI ROBOTIC ASSISTED LAPAROSCOPIC OVARIAN CYSTECTOMY;  Surgeon: Ward, Honor Loh, MD;   Location: [facility];  Service: Gynecology;  Laterality: Left;  . TONSILLECTOMY    . UMBILICAL HERNIA REPAIR N/A 07/11/2018   Procedure: LAPAROSCOPIC UMBILICAL HERNIA REPAIR;  Surgeon: Benjamine Sprague, DO;  Location: [facility];  Service: General;  Laterality: N/A;  . WISDOM TOOTH EXTRACTION     Medication List:  Current Outpatient Medications  Medication Sig Dispense Refill  . albuterol (PROAIR HFA) 108 (90 Base) MCG/ACT inhaler Inhale 2 puffs into the lungs every 6 (six) hours as needed for wheezing or shortness of breath. 8 g 11  . buPROPion (WELLBUTRIN) 75 MG tablet Take 150 mg by mouth daily.     . cetirizine (ZYRTEC) 10 MG tablet Take 1 tablet (10 mg total) by mouth daily as needed for allergies. 90 tablet 3  . diclofenac Sodium (VOLTAREN) 1 % GEL Apply 2 g topically 4 (four) times daily.     . Dolutegravir-lamiVUDine (DOVATO) 50-300 MG TABS Take 1 tablet by mouth daily. 30 tablet 5  . Galcanezumab-gnlm (EMGALITY) 120 MG/ML SOSY Inject 120 mg into the skin every 28 (twenty-eight) days.     . Glecaprevir-Pibrentasvir (MAVYRET) 100-40 MG TABS Take three tablets daily for 8 weeks 90 tablet 1  . hydrochlorothiazide (HYDRODIURIL) 25 MG tablet Take 1 tablet (25 mg total) by mouth daily. 90 tablet 3  . hydrOXYzine (ATARAX/VISTARIL) 25 MG tablet Take 2 tablets (50 mg total) by mouth every 8 (eight) hours as needed. 90 tablet 3  . ondansetron (ZOFRAN-ODT) 4 MG disintegrating tablet Take 1 tablet (4 mg total) by mouth every 8 (eight) hours as needed for nausea or vomiting. 15 tablet 0  . pantoprazole (PROTONIX) 40 MG tablet Take 1 tablet (40 mg total) by mouth daily. 90 tablet 3  . sertraline (ZOLOFT) 100 MG tablet Take 1.5 tablets (150 mg total) by mouth at bedtime. 90 tablet 3  . SUMAtriptan (IMITREX) 50 MG tablet Take 1 tablet (50 mg total) by mouth 2 (two) times daily as needed for migraine or headache. 10 tablet 5  . triamcinolone ointment (KENALOG) 0.5 % APPLY TOPICALLY TO THE AFFECTED AREA TWICE  DAILY 30 g 0  . fluticasone furoate-vilanterol (BREO ELLIPTA) 100-25 MCG/INH AEPB Inhale 1 puff into the lungs daily. Rinse mouth after each use. 60 each 5   No current facility-administered medications for this visit.   Allergies: Allergies  Allergen Reactions  . Naproxen Sodium Hives    Can take ibuprofen without causing any hives   Social History: Social History   Socioeconomic History  . Marital status: Unknown    Spouse name: Not on file  .  Number of children: Not on file  . Years of education: Not on file  . Highest education level: Not on file  Occupational History  . Not on file  Tobacco Use  . Smoking status: Former Smoker    Packs/day: 0.25    Years: 25.00    Pack years: 6.25    Types: Cigarettes  . Smokeless tobacco: Never Used  . Tobacco comment: uses vapor occasionally  Vaping Use  . Vaping Use: Never used  Substance and Sexual Activity  . Alcohol use: Not Currently    Comment: wine occ  . Drug use: Yes    Types: Marijuana    Comment: 2-3 x per month  . Sexual activity: Not Currently  Other Topics Concern  . Not on file  Social History Narrative  . Not on file   Social Determinants of Health   Financial Resource Strain:   . Difficulty of Paying Living Expenses: Not on file  Food Insecurity:   . Worried About Charity fundraiser in the Last Year: Not on file  . Ran Out of Food in the Last Year: Not on file  Transportation Needs:   . Lack of Transportation (Medical): Not on file  . Lack of Transportation (Non-Medical): Not on file  Physical Activity:   . Days of Exercise per Week: Not on file  . Minutes of Exercise per Session: Not on file  Stress:   . Feeling of Stress : Not on file  Social Connections:   . Frequency of Communication with Friends and Family: Not on file  . Frequency of Social Gatherings with Friends and Family: Not on file  . Attends Religious Services: Not on file  . Active Member of Clubs or Organizations: Not on file  .  Attends Archivist Meetings: Not on file  . Marital Status: Not on file   Lives in a house. Smoking: quit in 2019 (smoked from [place] 1/2 pack per day) Occupation: not employed  Environmental HistoryFreight forwarder in the house: yes Carpet in the family room: no Carpet in the bedroom: no Heating: gas Cooling: central Pet: no  Family History: Family History  Problem Relation Age of Onset  . Breast cancer Paternal Grandmother   . Breast cancer Cousin   . Hypertension Mother   . Lung cancer Father   . Diabetes Father   . Allergic rhinitis Maternal Aunt   . Asthma Maternal Aunt   . Angioedema Neg Hx   . Atopy Neg Hx   . Eczema Neg Hx   . Immunodeficiency Neg Hx   . Urticaria Neg Hx    Review of Systems  Constitutional: Negative for appetite change, chills, fever and unexpected weight change.  HENT: Positive for congestion and rhinorrhea.   Eyes: Positive for itching.  Respiratory: Positive for shortness of breath and wheezing. Negative for cough and chest tightness.   Cardiovascular: Negative for chest pain.  Gastrointestinal: Negative for abdominal pain.  Genitourinary: Negative for difficulty urinating.  Skin: Negative for rash.  Neurological: Negative for headaches.   Objective: BP 110/62   Pulse 98   Temp 98.7 F (37.1 C) (Temporal)   Resp 16   Ht 5\' 7"  (1.702 m)   Wt 175 lb 6.4 oz (79.6 kg)   LMP 07/10/2018 (Exact Date)   SpO2 100%   BMI 27.47 kg/m  Body mass index is 27.47 kg/m. Physical Exam Vitals and nursing note reviewed.  Constitutional:      Appearance: Normal appearance. She is  well-developed.  HENT:     Head: Normocephalic and atraumatic.     Right Ear: External ear normal.     Left Ear: External ear normal.     Nose: Nose normal.     Mouth/Throat:     Mouth: Mucous membranes are moist.     Pharynx: Oropharynx is clear.  Eyes:     Conjunctiva/sclera: Conjunctivae normal.  Cardiovascular:     Rate and Rhythm: Normal rate  and regular rhythm.     Heart sounds: Normal heart sounds. No murmur heard.  No friction rub. No gallop.   Pulmonary:     Effort: Pulmonary effort is normal.     Breath sounds: Normal breath sounds. No wheezing, rhonchi or rales.  Musculoskeletal:     Cervical back: Neck supple.  Skin:    General: Skin is warm.     Findings: No rash.     Comments: Itching skin throughout the visit.  Neurological:     Mental Status: She is alert and oriented to person, place, and time.  Psychiatric:        Behavior: Behavior normal.    The plan was reviewed with the patient/family, and all questions/concerned were addressed.  It was my pleasure to see Teresa [name] today and participate in her care. Please feel free to contact me with any questions or concerns.  Sincerely,  Rexene Alberts, DO Allergy & Immunology  [facility] office: [place] office: [phone]

## 2020-04-02 ENCOUNTER — Other Ambulatory Visit: Payer: Self-pay

## 2020-04-02 ENCOUNTER — Ambulatory Visit (INDEPENDENT_AMBULATORY_CARE_PROVIDER_SITE_OTHER): Payer: Medicaid Other | Admitting: Allergy

## 2020-04-02 ENCOUNTER — Encounter: Payer: Self-pay | Admitting: Allergy

## 2020-04-02 VITALS — BP 110/62 | HR 98 | Temp 98.7°F | Resp 16 | Ht 67.0 in | Wt 175.4 lb

## 2020-04-02 DIAGNOSIS — J302 Other seasonal allergic rhinitis: Secondary | ICD-10-CM | POA: Diagnosis not present

## 2020-04-02 DIAGNOSIS — R0602 Shortness of breath: Secondary | ICD-10-CM | POA: Diagnosis not present

## 2020-04-02 DIAGNOSIS — J3089 Other allergic rhinitis: Secondary | ICD-10-CM | POA: Insufficient documentation

## 2020-04-02 DIAGNOSIS — L299 Pruritus, unspecified: Secondary | ICD-10-CM

## 2020-04-02 MED ORDER — CETIRIZINE HCL 10 MG PO TABS: 10.0000 mg | ORAL_TABLET | Freq: Every day | ORAL | 3 refills | Status: DC | PRN

## 2020-04-02 MED ORDER — FLUTICASONE FUROATE-VILANTEROL 100-25 MCG/INH IN AEPB: 1.0000 | INHALATION_SPRAY | Freq: Every day | RESPIRATORY_TRACT | 5 refills | Status: DC

## 2020-04-02 NOTE — Assessment & Plan Note (Signed)
Whole body pruritus for 5 months. No associated rash but sometimes has bruising. Tried topical steroid cream, hydroxyzine, zyrtec, permethrin with minimal benefit. Past medical history significant for controlled HIV and not treated hepatitis C. Denies changes in medications, diet. She did change some of her personal care products.  Discussed with patient that the differential diagnosis for pruritus is vast however, given her history, concerned if the hepatitis is the main trigger. Not likely to be triggered by food or environmental allergies.  Continue to follow up with ID for HIV and Hepatitis C.  Start proper skin care - use dye free and fragrance products.   Do not use fabric softeners or dryer sheets.  Moisturize throughout day - do not use tea tree oil. Get bloodwork as below to rule out other etiologies.   Continue with taking zyrtec (cetirizine) 10mg  twice a day.  May take hydroxyzine 25mg  1 tablet every 8 hours if needed for itching.  This may make you drowsy.

## 2020-04-02 NOTE — Assessment & Plan Note (Signed)
Perennial rhino conjunctivitis symptoms which flares during the spring.   Continue with zyrtec as above.  Check environmental allergies via bloodwork and will make additional recommendations based on results.

## 2020-04-02 NOTE — Assessment & Plan Note (Signed)
Significant tobacco exposure history and used to be on Spiriva. Now having issues with shortness of breath and wheezing in the mornings and at night for the past 1.5 months.   Today's spirometry showed: normal pattern with no improvement in FEV1 post bronchodilator treatment. Clinically feeling the same. Patient used albuterol 1-2 hours before visit. . Daily controller medication(s): start Breo 172mcg 1 puff daily given daily symptoms and rinse mouth after each use. Sample given and demonstrated proper use.  . May use albuterol rescue inhaler 2 puffs every 4 to 6 hours as needed for shortness of breath, chest tightness, coughing, and wheezing. May use albuterol rescue inhaler 2 puffs 5 to 15 minutes prior to strenuous physical activities. Monitor frequency of use.  . Repeat spirometry at next visit.

## 2020-04-02 NOTE — Patient Instructions (Addendum)
Itching:   There can be a lot of triggers for itching.  Given your history, concerned if the hepatitis is the main trigger.   Keep following with infectious disease.  See below for proper skin care - use dye free and fragrance products.   Do not use fabric softeners or dryer sheets.  Moisturize throughout day - do not use tea tree oil. Get bloodwork:  We are ordering labs, so please allow 1-2 weeks for the results to come back. With the newly implemented Cures Act, the labs might be visible to you at the same time that they become visible to me. However, I will not address the results until all of the results are back, so please be patient.   Continue with taking zyrtec (cetirizine) 10mg  twice a day.  May take hydroxyzine 25mg  1 tablet every 8 hours if needed for itching.  This may make you drowsy.   Breathing: . Daily controller medication(s): start Breo 163mcg 1 puff daily and rinse mouth after each use. Sample given and demonstrated propre use.  . May use albuterol rescue inhaler 2 puffs every 4 to 6 hours as needed for shortness of breath, chest tightness, coughing, and wheezing. May use albuterol rescue inhaler 2 puffs 5 to 15 minutes prior to strenuous physical activities. Monitor frequency of use.  . Asthma control goals:  o Full participation in all desired activities (may need albuterol before activity) o Albuterol use two times or less a week on average (not counting use with activity) o Cough interfering with sleep two times or less a month o Oral steroids no more than once a year o No hospitalizations  Rhinitis:  Continue with zyrtec as above.  Will check environmental allergies via bloodwork and make additional recommendations based on results.  Follow up in 2 months or sooner if needed.   Skin care recommendations  Bath time: . Always use lukewarm water. AVOID very hot or cold water. Marland Kitchen Keep bathing time to 5-10 minutes. . Do NOT use bubble bath. . Use a  mild soap and use just enough to wash the dirty areas. . Do NOT scrub skin vigorously.  . After bathing, pat dry your skin with a towel. Do NOT rub or scrub the skin.  Moisturizers and prescriptions:  . ALWAYS apply moisturizers immediately after bathing (within 3 minutes). This helps to lock-in moisture. . Use the moisturizer several times a day over the whole body. Kermit Balo summer moisturizers include: Aveeno, CeraVe, Cetaphil. Kermit Balo winter moisturizers include: Aquaphor, Vaseline, Cerave, Cetaphil, Eucerin, Vanicream. . When using moisturizers along with medications, the moisturizer should be applied about one hour after applying the medication to prevent diluting effect of the medication or moisturize around where you applied the medications. When not using medications, the moisturizer can be continued twice daily as maintenance.  Laundry and clothing: . Avoid laundry products with added color or perfumes. . Use unscented hypo-allergenic laundry products such as Tide free, Cheer free & gentle, and All free and clear.  . If the skin still seems dry or sensitive, you can try double-rinsing the clothes. . Avoid tight or scratchy clothing such as wool. . Do not use fabric softeners or dyer sheets.

## 2020-04-08 LAB — ALPHA-GAL PANEL

## 2020-04-09 LAB — TRYPTASE: Tryptase: 6.4 ug/L (ref 2.2–13.2)

## 2020-04-09 LAB — ALLERGENS W/TOTAL IGE AREA 2
Alternaria Alternata IgE: <0.1 kU/L
Aspergillus Fumigatus IgE: <0.1 kU/L
Bermuda Grass IgE: 0.32 kU/L — AB
Cat Dander IgE: <0.1 kU/L
Cedar, Mountain IgE: 0.26 kU/L — AB
Cladosporium Herbarum IgE: <0.1 kU/L
Cockroach, German IgE: 0.24 kU/L — AB
Common Silver Birch IgE: 0.22 kU/L — AB
Cottonwood IgE: 0.24 kU/L — AB
D Farinae IgE: <0.1 kU/L
D Pteronyssinus IgE: <0.1 kU/L
Dog Dander IgE: <0.1 kU/L
Elm, American IgE: 0.3 kU/L — AB
IgE (Immunoglobulin E), Serum: 65 IU/mL (ref 6–495)
Johnson Grass IgE: 0.29 kU/L — AB
Maple/Box Elder IgE: 0.61 kU/L — AB
Mouse Urine IgE: <0.1 kU/L
Oak, White IgE: 0.31 kU/L — AB
Pecan, Hickory IgE: 0.27 kU/L — AB
Penicillium Chrysogen IgE: <0.1 kU/L
Pigweed, Rough IgE: 0.26 kU/L — AB
Ragweed, Short IgE: 0.31 kU/L — AB
Sheep Sorrel IgE Qn: 0.38 kU/L — AB
Timothy Grass IgE: 0.32 kU/L — AB
White Mulberry IgE: 0.2 kU/L — AB

## 2020-04-09 LAB — ALPHA-GAL PANEL
Alpha Gal IgE*: <0.1 kU/L (ref <0.10)
Beef (Bos spp) IgE: <0.1 kU/L (ref <0.35)
Class Interpretation: 0
Class Interpretation: 0
Class Interpretation: 0
Lamb/Mutton (Ovis spp) IgE: <0.1 kU/L (ref <0.35)
Pork (Sus spp) IgE: <0.1 kU/L (ref <0.35)

## 2020-04-09 LAB — ANA W/REFLEX: Anti Nuclear Antibody (ANA): NEGATIVE

## 2020-04-10 ENCOUNTER — Other Ambulatory Visit: Payer: Medicaid Other

## 2020-04-15 ENCOUNTER — Encounter (HOSPITAL_COMMUNITY): Payer: Self-pay

## 2020-04-15 ENCOUNTER — Ambulatory Visit (HOSPITAL_COMMUNITY): Admission: RE | Admit: 2020-04-15 | Payer: Medicaid Other | Source: Ambulatory Visit

## 2020-04-16 ENCOUNTER — Other Ambulatory Visit: Payer: Self-pay

## 2020-04-16 ENCOUNTER — Telehealth: Payer: Self-pay | Admitting: Infectious Disease

## 2020-04-16 ENCOUNTER — Ambulatory Visit (HOSPITAL_COMMUNITY)
Admission: RE | Admit: 2020-04-16 | Discharge: 2020-04-16 | Disposition: A | Discharge status: Home / Self Care | Payer: Medicaid Other | Source: Ambulatory Visit | Attending: Infectious Disease | Admitting: Infectious Disease

## 2020-04-16 ENCOUNTER — Other Ambulatory Visit: Payer: Self-pay | Admitting: Infectious Disease

## 2020-04-16 DIAGNOSIS — B182 Chronic viral hepatitis C: Secondary | ICD-10-CM | POA: Diagnosis present

## 2020-04-16 DIAGNOSIS — C220 Liver cell carcinoma: Secondary | ICD-10-CM

## 2020-04-16 DIAGNOSIS — R772 Abnormality of alphafetoprotein: Secondary | ICD-10-CM | POA: Diagnosis present

## 2020-04-16 MED ORDER — GADOBUTROL 1 MMOL/ML IV SOLN
8.0000 mL | Freq: Once | INTRAVENOUS | Status: AC | PRN
  Administered 2020-04-16: 8 mL via INTRAVENOUS

## 2020-04-16 NOTE — Telephone Encounter (Signed)
Patient's MRI is concerning for liver cancer  This explains her super high AFP (she had prior normal MRI liver last year but I suppose tumor was not yet visible)  This also likely explains itching too  I have ordered CT guided biopsy of this and referral to oncology  I am happy to discuss w her in person tomorrow or over phone for a visit or brief chat

## 2020-04-17 ENCOUNTER — Telehealth: Payer: Self-pay | Admitting: Infectious Disease

## 2020-04-17 ENCOUNTER — Other Ambulatory Visit: Payer: Self-pay | Admitting: Infectious Disease

## 2020-04-17 NOTE — Telephone Encounter (Signed)
I discussed this case with Dr. Benay Spice   He feels based on size of tumor that it can likely be either surgically resected or radio-ablated.   He is having his navigator get pt connected to [facility] surgical expert  Seems she should be able to start HCV treatment  Just want to check w you [name] before giving her greenlight  I updated her on plan for the [facility]

## 2020-04-18 NOTE — Telephone Encounter (Signed)
We usually wait until [facility] is resolved. When someone has [place], the chances of cure with DAAs goes down to 50-60%. This is usually with chemo and radiation though, so I'm not 100% with just surgical resection. I would still err on the side of waiting until it is resolved/resected, but up to you.

## 2020-04-18 NOTE — Telephone Encounter (Signed)
Hmmm maybe find out what time table is on resection or ablation

## 2020-04-21 ENCOUNTER — Encounter: Payer: Self-pay | Admitting: Endocrinology

## 2020-04-21 ENCOUNTER — Ambulatory Visit: Payer: Self-pay | Admitting: Surgery

## 2020-04-21 NOTE — H&P (View-Only) (Signed)
History of Present Illness (Teresa [name] Teresa [name]; 04/21/2020 11:45 [name]) The patient is a 49 year old female who presents with a liver mass. Teresa [name] is a 49 yo female who was referred with a newly-diagnosed hepatocellular carcinoma. She has a history of HCV (diagnosed 2010), for which she is followed by Dr. Daiva Eves in infectious diseases (she has not yet been treated for HCV). She was found to have an elevated AFP, for which she underwent an MRI of the liver in February 2021. This showed a small benign cyst in segment 7 but no lesions concerning for HCC. She underwent follow up MRI on 12/22, which showed a 1.7cm arterially enhancing mass in segment 7, read as a LiRADS-5 lesion consistent with HCC. There were no features of cirrhosis or portal hypertension. Her last LFTs on 11/16 showed a very mild elevation in her transaminases with a normal bilirubin. She was referred to discuss surgical resection. She says she has occasional abdominal pain but denies jaundice.  PMH: migraines, HCV, depression PSH: C section, hysterectomy, lap ovarian cystectomy, lap ventral hernia repair with mesh FHx: breast cancer (cousin), colon cancer (father) SocHx: former smoker, occasional EtOH use   Past Surgical History Teresa [name], Teresa [name]) Cesarean Section - Multiple Hysterectomy (due to cancer) - Complete Oral Surgery Ventral / Umbilical Hernia Surgery multiple  Diagnostic Studies History Teresa [name], Teresa [name]) Colonoscopy never Mammogram 1-3 years ago Pap Smear 1-5 years ago  Allergies Teresa [name], Teresa [name]) No Known Drug Allergies [04/21/2020]: Allergies Reconciled  Medication History (Teresa [name], Teresa [name]) hydrOXYzine HCl (25MG  Tablet, Oral) Active. Albuterol Sulfate HFA (108 (90 Base)MCG/ACT Aerosol Soln, Inhalation) Active. Cetirizine HCl (10MG  Tablet, Oral) Active. SUMAtriptan Succinate (50MG  Tablet, Oral)  Active. traZODone HCl (50MG  Tablet, Oral) Active. Verapamil HCl ER (240MG  Tablet ER, Oral) Active. Sertraline HCl (100MG  Tablet, Oral) Active. Sertraline HCl (50MG  Tablet, Oral) Active. hydroCHLOROthiazide (25MG  Tablet, Oral) Active. buPROPion HCl ER (XL) (150MG  Tablet ER 24HR, Oral) Active. Dovato (50-300MG  Tablet, Oral) Active. Medications Reconciled  Social History , ; 04/21/2020 9:02 [name]) Alcohol use Occasional alcohol use. Illicit drug use Uses weekly. Tobacco use Former smoker.  Family History , ; 04/21/2020 9:01 [name]) Alcohol Abuse Father. Cerebrovascular Accident Father. Diabetes Mellitus Father. Heart Disease Father, Mother. Hypertension Mother.  Pregnancy / Birth History , ; 04/21/2020 9:02 [name]) Age at menarche 14 years. Gravida 4 Irregular periods Maternal age 63-20 Para 4  Other Problems [name], Teresa [name]; 04/21/2020 9:02 [name]) Anxiety Disorder Arthritis Asthma Back Pain Depression Gastroesophageal Reflux Disease Hepatitis High blood pressure HIV-positive Migraine Headache Oophorectomy Bilateral. Pancreatitis     Review of Systems Teresa [name] Teresa [name]; 04/21/2020 9:02 [name]) General Present- Appetite Loss, Fatigue and Night Sweats. Not Present- Chills, Fever, Weight Gain and Weight Loss. Skin Present- Dryness. Not Present- Change in Wart/Mole, Hives, Jaundice, New Lesions, Non-Healing Wounds, Rash and Ulcer. HEENT Present- Seasonal Allergies and Wears glasses/contact lenses. Not Present- Earache, Hearing Loss, Hoarseness, Nose Bleed, Oral Ulcers, Ringing in the Ears, Sinus Pain, Sore Throat, Visual Disturbances and Yellow Eyes. Respiratory Present- Snoring. Not Present- Bloody sputum, Chronic Cough, Difficulty Breathing and Wheezing. Breast Not Present- Breast Mass, Breast Pain, Nipple Discharge and Skin Changes. Cardiovascular Present- Shortness of Breath. Not Present-  Chest Pain, Difficulty Breathing Lying Down, Leg Cramps, Palpitations, Rapid Heart Rate and Swelling of Extremities. Gastrointestinal Present- Nausea. Not Present- Abdominal Pain, Bloating, Bloody Stool, Change in Bowel Habits, Chronic diarrhea, Constipation, Difficulty Swallowing,  Excessive gas, Gets full quickly at meals, Hemorrhoids, Indigestion, Rectal Pain and Vomiting. Female Genitourinary Present- Frequency. Not Present- Nocturia, Painful Urination, Pelvic Pain and Urgency. Musculoskeletal Present- Muscle Weakness. Not Present- Back Pain, Joint Pain, Joint Stiffness, Muscle Pain and Swelling of Extremities. Neurological Present- Decreased Memory, Headaches and Tingling. Not Present- Fainting, Numbness, Seizures, Tremor, Trouble walking and Weakness. Psychiatric Present- Anxiety, Bipolar, Change in Sleep Pattern, Depression and Fearful. Not Present- Frequent crying. Endocrine Present- Excessive Hunger, Hair Changes and Heat Intolerance. Not Present- Cold Intolerance, Hot flashes and New Diabetes. Hematology Present- HIV. Not Present- Blood Thinners, Easy Bruising, Excessive bleeding, Gland problems and Persistent Infections.  Vitals (Teresa [name]; 04/21/2020 9:30 [name]) 04/21/2020 9:30 [name] Weight: 175.25 lb Height: 67in Body Surface Area: 1.91 m Body Mass Index: 27.45 kg/m  Temp.: 97.39F  Pulse: 74 (Regular)  P.OX: 95% (Room air) BP: 118/80(Sitting, Left Arm, Standard)        Physical Exam (Teresa [name]; 04/21/2020 11:54 [name])  The physical exam findings are as follows: Note:Constitutional: No acute distress; conversant; no deformities Neuro: alert and oriented; cranial nerves grossly in tact; no focal deficits Eyes: Moist conjunctiva; anicteric sclerae; extraocular movements in tact Neck: Trachea midline Lungs: Normal respiratory effort; lungs clear to auscultation bilaterally; symmetric chest wall expansion CV: Regular rate and rhythm; no murmurs; no pitting  edema GI: Abdomen soft, nontender, nondistended; no masses or organomegaly; well-healed lower midline surgical scar and port site scars MSK: Normal gait and station; no clubbing/cyanosis Psychiatric: Appropriate affect; alert and oriented 3    Assessment & Plan (Natalina Wieting L. Zenia Resides Teresa [name]; 04/21/2020 12:05 PM)  HEPATOCELLULAR CARCINOMA (C22.0) Impression: 49 yo female with a history of HCV, presenting with a newly-diagnosed HCC (LiRADS-5), segment 7). I reviewed her liver MRI. There is a small arterially enhancing lesion in segment 7, less than 2cm in diameter, relatively superficial and not near any major hepatic vasculature. She has no signs of underlying cirrhosis or portal hypertension, and is otherwise in good health. This lesion is technically resectable, and I recommend resection over ablation. With a LiRADS-5 lesion with elevated AFP, biopsy is not needed for confirmation of diagnosis. I discussed the details of surgery with the patient - although it is a small tumor and will require minimal resection of the surrounding liver parenchyma, its location in the dome of the liver will make a laparoscopic approach difficult. I will begin with diagnostic laparoscopy but will have a low threshold to convert to an open operation and I discussed this with the patient. She can anticipate a 3-5 day hospital stay in the case of an open surgery. I discussed the risks including bleeding. Risk of post-hepatectomy liver failure is very low as this will be a very small resection. Patient agrees to proceed with surgery. Will schedule her in January. I counseled that she should proceed with HCV treatment as planned by Dr. [name] after surgery to minimize the risk of cirrhosis and recurrent [place].   Michaelle Birks, [facility], Hepatobiliary and Pancreatic Surgery 04/21/20 12:06 PM

## 2020-04-21 NOTE — H&P (Signed)
History of Present Illness (Magaret Justo L. Freida Busman MD; 04/21/2020 11:45 AM) The patient is a 49 year old female who presents with a liver mass. Ms. Dickman is a 49 yo female who was referred with a newly-diagnosed hepatocellular carcinoma. She has a history of HCV (diagnosed 2010), for which she is followed by Dr. Daiva Eves in infectious diseases (she has not yet been treated for HCV). She was found to have an elevated AFP, for which she underwent an MRI of the liver in February 2021. This showed a small benign cyst in segment 7 but no lesions concerning for HCC. She underwent follow up MRI on 12/22, which showed a 1.7cm arterially enhancing mass in segment 7, read as a LiRADS-5 lesion consistent with HCC. There were no features of cirrhosis or portal hypertension. Her last LFTs on 11/16 showed a very mild elevation in her transaminases with a normal bilirubin. She was referred to discuss surgical resection. She says she has occasional abdominal pain but denies jaundice.  PMH: migraines, HCV, depression PSH: C section, hysterectomy, lap ovarian cystectomy, lap ventral hernia repair with mesh FHx: breast cancer (cousin), colon cancer (father) SocHx: former smoker, occasional EtOH use   Past Surgical History Jaynee Eagles, CMA; 04/21/2020 9:01 AM) Cesarean Section - Multiple Hysterectomy (due to cancer) - Complete Oral Surgery Ventral / Umbilical Hernia Surgery multiple  Diagnostic Studies History Jaynee Eagles, CMA; 04/21/2020 9:01 AM) Colonoscopy never Mammogram 1-3 years ago Pap Smear 1-5 years ago  Allergies Rosezella [place], RN; 04/21/2020 9:35 AM) No Known Drug Allergies [04/21/2020]: Allergies Reconciled  Medication History (Diane Herrin, RN; 04/21/2020 9:35 AM) hydrOXYzine HCl (25MG  Tablet, Oral) Active. Albuterol Sulfate HFA (108 (90 Base)MCG/ACT Aerosol Soln, Inhalation) Active. Cetirizine HCl (10MG  Tablet, Oral) Active. SUMAtriptan Succinate (50MG  Tablet, Oral)  Active. traZODone HCl (50MG  Tablet, Oral) Active. Verapamil HCl ER (240MG  Tablet ER, Oral) Active. Sertraline HCl (100MG  Tablet, Oral) Active. Sertraline HCl (50MG  Tablet, Oral) Active. hydroCHLOROthiazide (25MG  Tablet, Oral) Active. buPROPion HCl ER (XL) (150MG  Tablet ER 24HR, Oral) Active. Dovato (50-300MG  Tablet, Oral) Active. Medications Reconciled  Social History , ; 04/21/2020 9:02 AM) Alcohol use Occasional alcohol use. Illicit drug use Uses weekly. Tobacco use Former smoker.  Family History , ; 04/21/2020 9:01 AM) Alcohol Abuse Father. Cerebrovascular Accident Father. Diabetes Mellitus Father. Heart Disease Father, Mother. Hypertension Mother.  Pregnancy / Birth History , ; 04/21/2020 9:02 AM) Age at menarche 14 years. Gravida 4 Irregular periods Maternal age 63-20 Para 4  Other Problems [name], CMA; 04/21/2020 9:02 AM) Anxiety Disorder Arthritis Asthma Back Pain Depression Gastroesophageal Reflux Disease Hepatitis High blood pressure HIV-positive Migraine Headache Oophorectomy Bilateral. Pancreatitis     Review of Systems Jaynee Eagles CMA; 04/21/2020 9:02 AM) General Present- Appetite Loss, Fatigue and Night Sweats. Not Present- Chills, Fever, Weight Gain and Weight Loss. Skin Present- Dryness. Not Present- Change in Wart/Mole, Hives, Jaundice, New Lesions, Non-Healing Wounds, Rash and Ulcer. HEENT Present- Seasonal Allergies and Wears glasses/contact lenses. Not Present- Earache, Hearing Loss, Hoarseness, Nose Bleed, Oral Ulcers, Ringing in the Ears, Sinus Pain, Sore Throat, Visual Disturbances and Yellow Eyes. Respiratory Present- Snoring. Not Present- Bloody sputum, Chronic Cough, Difficulty Breathing and Wheezing. Breast Not Present- Breast Mass, Breast Pain, Nipple Discharge and Skin Changes. Cardiovascular Present- Shortness of Breath. Not Present-  Chest Pain, Difficulty Breathing Lying Down, Leg Cramps, Palpitations, Rapid Heart Rate and Swelling of Extremities. Gastrointestinal Present- Nausea. Not Present- Abdominal Pain, Bloating, Bloody Stool, Change in Bowel Habits, Chronic diarrhea, Constipation, Difficulty Swallowing,  Excessive gas, Gets full quickly at meals, Hemorrhoids, Indigestion, Rectal Pain and Vomiting. Female Genitourinary Present- Frequency. Not Present- Nocturia, Painful Urination, Pelvic Pain and Urgency. Musculoskeletal Present- Muscle Weakness. Not Present- Back Pain, Joint Pain, Joint Stiffness, Muscle Pain and Swelling of Extremities. Neurological Present- Decreased Memory, Headaches and Tingling. Not Present- Fainting, Numbness, Seizures, Tremor, Trouble walking and Weakness. Psychiatric Present- Anxiety, Bipolar, Change in Sleep Pattern, Depression and Fearful. Not Present- Frequent crying. Endocrine Present- Excessive Hunger, Hair Changes and Heat Intolerance. Not Present- Cold Intolerance, Hot flashes and New Diabetes. Hematology Present- HIV. Not Present- Blood Thinners, Easy Bruising, Excessive bleeding, Gland problems and Persistent Infections.  Vitals (Diane Herrin RN; 04/21/2020 9:30 AM) 04/21/2020 9:30 AM Weight: 175.25 lb Height: 67in Body Surface Area: 1.91 m Body Mass Index: 27.45 kg/m  Temp.: 97.39F  Pulse: 74 (Regular)  P.OX: 95% (Room air) BP: 118/80(Sitting, Left Arm, Standard)        Physical Exam (Jibreel Fedewa L. Zenia Resides MD; 04/21/2020 11:54 AM)  The physical exam findings are as follows: Note:Constitutional: No acute distress; conversant; no deformities Neuro: alert and oriented; cranial nerves grossly in tact; no focal deficits Eyes: Moist conjunctiva; anicteric sclerae; extraocular movements in tact Neck: Trachea midline Lungs: Normal respiratory effort; lungs clear to auscultation bilaterally; symmetric chest wall expansion CV: Regular rate and rhythm; no murmurs; no pitting  edema GI: Abdomen soft, nontender, nondistended; no masses or organomegaly; well-healed lower midline surgical scar and port site scars MSK: Normal gait and station; no clubbing/cyanosis Psychiatric: Appropriate affect; alert and oriented 3    Assessment & Plan (Audie Wieser L. Zenia Resides MD; 04/21/2020 12:05 PM)  HEPATOCELLULAR CARCINOMA (C22.0) Impression: 49 yo female with a history of HCV, presenting with a newly-diagnosed HCC (LiRADS-5), segment 7). I reviewed her liver MRI. There is a small arterially enhancing lesion in segment 7, less than 2cm in diameter, relatively superficial and not near any major hepatic vasculature. She has no signs of underlying cirrhosis or portal hypertension, and is otherwise in good health. This lesion is technically resectable, and I recommend resection over ablation. With a LiRADS-5 lesion with elevated AFP, biopsy is not needed for confirmation of diagnosis. I discussed the details of surgery with the patient - although it is a small tumor and will require minimal resection of the surrounding liver parenchyma, its location in the dome of the liver will make a laparoscopic approach difficult. I will begin with diagnostic laparoscopy but will have a low threshold to convert to an open operation and I discussed this with the patient. She can anticipate a 3-5 day hospital stay in the case of an open surgery. I discussed the risks including bleeding. Risk of post-hepatectomy liver failure is very low as this will be a very small resection. Patient agrees to proceed with surgery. Will schedule her in January. I counseled that she should proceed with HCV treatment as planned by Dr. [name] after surgery to minimize the risk of cirrhosis and recurrent [place].   Michaelle Birks, [facility], Hepatobiliary and Pancreatic Surgery 04/21/20 12:06 PM

## 2020-04-28 ENCOUNTER — Other Ambulatory Visit (HOSPITAL_COMMUNITY)
Admission: RE | Admit: 2020-04-28 | Discharge: 2020-04-28 | Disposition: A | Discharge status: Home / Self Care | Payer: Medicaid Other | Source: Ambulatory Visit | Attending: Surgery | Admitting: Surgery

## 2020-04-28 DIAGNOSIS — Z20822 Contact with and (suspected) exposure to covid-19: Secondary | ICD-10-CM | POA: Diagnosis not present

## 2020-04-28 DIAGNOSIS — Z01818 Encounter for other preprocedural examination: Secondary | ICD-10-CM | POA: Diagnosis not present

## 2020-04-28 LAB — SARS CORONAVIRUS 2 (TAT 6-24 HRS): SARS Coronavirus 2: NEGATIVE

## 2020-04-29 ENCOUNTER — Encounter (HOSPITAL_COMMUNITY): Payer: Self-pay | Admitting: Surgery

## 2020-04-29 NOTE — Progress Notes (Signed)
Spoke with pt for pre-op call. Pt denies cardiac history or Diabetes. Pt is treated for HTN.  Covid test done 04/28/20 and it's negative.  Pt states she's been in quarantine since the test was done and understands that she stays in quarantine until she comes to the hospital tomorrow

## 2020-04-30 ENCOUNTER — Inpatient Hospital Stay (HOSPITAL_COMMUNITY): Payer: Medicaid Other | Admitting: Anesthesiology

## 2020-04-30 ENCOUNTER — Encounter (HOSPITAL_COMMUNITY): Payer: Self-pay | Admitting: Surgery

## 2020-04-30 ENCOUNTER — Encounter (HOSPITAL_COMMUNITY)
Admission: RE | Disposition: A | Discharge status: Home / Self Care | Payer: Self-pay | Source: Home / Self Care | Attending: Surgery

## 2020-04-30 ENCOUNTER — Ambulatory Visit: Payer: Medicaid Other | Admitting: Pharmacist

## 2020-04-30 ENCOUNTER — Inpatient Hospital Stay (HOSPITAL_COMMUNITY)
Admission: RE | Admit: 2020-04-30 | Discharge: 2020-05-07 | DRG: 358 | Disposition: A | Discharge status: Home / Self Care | Payer: Medicaid Other | Attending: Surgery | Admitting: Surgery

## 2020-04-30 ENCOUNTER — Other Ambulatory Visit: Payer: Self-pay

## 2020-04-30 DIAGNOSIS — R772 Abnormality of alphafetoprotein: Secondary | ICD-10-CM | POA: Diagnosis not present

## 2020-04-30 DIAGNOSIS — J45909 Unspecified asthma, uncomplicated: Secondary | ICD-10-CM | POA: Diagnosis present

## 2020-04-30 DIAGNOSIS — Z79899 Other long term (current) drug therapy: Secondary | ICD-10-CM

## 2020-04-30 DIAGNOSIS — Z21 Asymptomatic human immunodeficiency virus [HIV] infection status: Secondary | ICD-10-CM | POA: Diagnosis present

## 2020-04-30 DIAGNOSIS — G43909 Migraine, unspecified, not intractable, without status migrainosus: Secondary | ICD-10-CM | POA: Diagnosis present

## 2020-04-30 DIAGNOSIS — I10 Essential (primary) hypertension: Secondary | ICD-10-CM | POA: Diagnosis not present

## 2020-04-30 DIAGNOSIS — F32A Depression, unspecified: Secondary | ICD-10-CM | POA: Diagnosis not present

## 2020-04-30 DIAGNOSIS — C220 Liver cell carcinoma: Secondary | ICD-10-CM | POA: Diagnosis present

## 2020-04-30 DIAGNOSIS — Z87891 Personal history of nicotine dependence: Secondary | ICD-10-CM

## 2020-04-30 DIAGNOSIS — D1803 Hemangioma of intra-abdominal structures: Secondary | ICD-10-CM

## 2020-04-30 DIAGNOSIS — B182 Chronic viral hepatitis C: Secondary | ICD-10-CM | POA: Diagnosis not present

## 2020-04-30 DIAGNOSIS — Z886 Allergy status to analgesic agent status: Secondary | ICD-10-CM | POA: Diagnosis not present

## 2020-04-30 DIAGNOSIS — K219 Gastro-esophageal reflux disease without esophagitis: Secondary | ICD-10-CM | POA: Diagnosis not present

## 2020-04-30 HISTORY — PX: OPEN PARTIAL HEPATECTOMY [83]: SHX5987

## 2020-04-30 HISTORY — PX: LAPAROSCOPY: SHX197

## 2020-04-30 HISTORY — DX: Malignant (primary) neoplasm, unspecified: C80.1

## 2020-04-30 LAB — CBC
HCT: 41.3 % (ref 36.0–46.0)
HCT: 35.1 % — ABNORMAL LOW (ref 36.0–46.0)
Hemoglobin: 13.5 g/dL (ref 12.0–15.0)
Hemoglobin: 11.4 g/dL — ABNORMAL LOW (ref 12.0–15.0)
MCH: 28.9 pg (ref 26.0–34.0)
MCH: 28.9 pg (ref 26.0–34.0)
MCHC: 32.7 g/dL (ref 30.0–36.0)
MCHC: 32.5 g/dL (ref 30.0–36.0)
MCV: 88.4 fL (ref 80.0–100.0)
MCV: 88.9 fL (ref 80.0–100.0)
Platelets: 146 10*3/uL — ABNORMAL LOW (ref 150–400)
Platelets: 150 10*3/uL (ref 150–400)
RBC: 4.67 MIL/uL (ref 3.87–5.11)
RBC: 3.95 MIL/uL (ref 3.87–5.11)
RDW: 13.2 % (ref 11.5–15.5)
RDW: 13.2 % (ref 11.5–15.5)
WBC: 4.1 10*3/uL (ref 4.0–10.5)
WBC: 11.9 10*3/uL — ABNORMAL HIGH (ref 4.0–10.5)
nRBC: 0 % (ref 0.0–0.2)
nRBC: 0 % (ref 0.0–0.2)

## 2020-04-30 LAB — COMPREHENSIVE METABOLIC PANEL
ALT: 56 U/L — ABNORMAL HIGH (ref 0–44)
ALT: 80 U/L — ABNORMAL HIGH (ref 0–44)
AST: 78 U/L — ABNORMAL HIGH (ref 15–41)
AST: 239 U/L — ABNORMAL HIGH (ref 15–41)
Albumin: 3.4 g/dL — ABNORMAL LOW (ref 3.5–5.0)
Albumin: 3 g/dL — ABNORMAL LOW (ref 3.5–5.0)
Alkaline Phosphatase: 101 U/L (ref 38–126)
Alkaline Phosphatase: 77 U/L (ref 38–126)
Anion gap: 11 (ref 5–15)
Anion gap: 9 (ref 5–15)
BUN: 7 mg/dL (ref 6–20)
BUN: 6 mg/dL (ref 6–20)
CO2: 23 mmol/L (ref 22–32)
CO2: 25 mmol/L (ref 22–32)
Calcium: 8.7 mg/dL — ABNORMAL LOW (ref 8.9–10.3)
Calcium: 8.1 mg/dL — ABNORMAL LOW (ref 8.9–10.3)
Chloride: 103 mmol/L (ref 98–111)
Chloride: 102 mmol/L (ref 98–111)
Creatinine, Ser: 0.88 mg/dL (ref 0.44–1.00)
Creatinine, Ser: 0.82 mg/dL (ref 0.44–1.00)
GFR, Estimated: >60 mL/min (ref >60)
GFR, Estimated: >60 mL/min (ref >60)
Glucose, Bld: 100 mg/dL — ABNORMAL HIGH (ref 70–99)
Glucose, Bld: 191 mg/dL — ABNORMAL HIGH (ref 70–99)
Potassium: 3.2 mmol/L — ABNORMAL LOW (ref 3.5–5.1)
Potassium: 3.2 mmol/L — ABNORMAL LOW (ref 3.5–5.1)
Sodium: 137 mmol/L (ref 135–145)
Sodium: 136 mmol/L (ref 135–145)
Total Bilirubin: 1.4 mg/dL — ABNORMAL HIGH (ref 0.3–1.2)
Total Bilirubin: 1.3 mg/dL — ABNORMAL HIGH (ref 0.3–1.2)
Total Protein: 6.7 g/dL (ref 6.5–8.1)
Total Protein: 5.7 g/dL — ABNORMAL LOW (ref 6.5–8.1)

## 2020-04-30 LAB — PROTIME-INR
INR: 1.1 (ref 0.8–1.2)
Prothrombin Time: 13.9 seconds (ref 11.4–15.2)

## 2020-04-30 LAB — PREPARE RBC (CROSSMATCH)

## 2020-04-30 SURGERY — LAPAROSCOPY, DIAGNOSTIC
Anesthesia: General | Site: Abdomen | Laterality: Right

## 2020-04-30 MED ORDER — GABAPENTIN 300 MG PO CAPS
300.0000 mg | ORAL_CAPSULE | Freq: Once | ORAL | Status: AC
  Administered 2020-04-30: 300 mg via ORAL
  Filled 2020-04-30: qty 1

## 2020-04-30 MED ORDER — PROPOFOL 10 MG/ML IV BOLUS
INTRAVENOUS | Status: AC
  Filled 2020-04-30: qty 20

## 2020-04-30 MED ORDER — SODIUM CHLORIDE 0.9 % IR SOLN
Status: DC | PRN
  Administered 2020-04-30: 1000 mL

## 2020-04-30 MED ORDER — TRAZODONE HCL 100 MG PO TABS
100.0000 mg | ORAL_TABLET | Freq: Every evening | ORAL | Status: DC | PRN
  Filled 2020-04-30: qty 1

## 2020-04-30 MED ORDER — BUPIVACAINE HCL (PF) 0.25 % IJ SOLN
INTRAMUSCULAR | Status: AC
  Filled 2020-04-30: qty 30

## 2020-04-30 MED ORDER — PROMETHAZINE HCL 25 MG/ML IJ SOLN: 6.2500 mg | INTRAMUSCULAR | Status: DC | PRN

## 2020-04-30 MED ORDER — HYDROMORPHONE HCL 1 MG/ML IJ SOLN
INTRAMUSCULAR | Status: AC
  Filled 2020-04-30: qty 1

## 2020-04-30 MED ORDER — ALBUTEROL SULFATE HFA 108 (90 BASE) MCG/ACT IN AERS: 2.0000 | INHALATION_SPRAY | Freq: Four times a day (QID) | RESPIRATORY_TRACT | Status: DC | PRN

## 2020-04-30 MED ORDER — BUPROPION HCL ER (XL) 150 MG PO TB24
150.0000 mg | ORAL_TABLET | Freq: Every day | ORAL | Status: DC
  Administered 2020-05-01 – 2020-05-06 (×6): 150 mg via ORAL
  Filled 2020-04-30 (×6): qty 1

## 2020-04-30 MED ORDER — KETAMINE HCL 10 MG/ML IJ SOLN
INTRAMUSCULAR | Status: DC | PRN
  Administered 2020-04-30: 20 mg via INTRAVENOUS
  Administered 2020-04-30: 30 mg via INTRAVENOUS

## 2020-04-30 MED ORDER — HEMOSTATIC AGENTS (NO CHARGE) OPTIME
TOPICAL | Status: DC | PRN
  Administered 2020-04-30: 1 via TOPICAL

## 2020-04-30 MED ORDER — CEFAZOLIN SODIUM-DEXTROSE 2-4 GM/100ML-% IV SOLN
2.0000 g | INTRAVENOUS | Status: AC
  Administered 2020-04-30: 2 g via INTRAVENOUS

## 2020-04-30 MED ORDER — POTASSIUM CHLORIDE 10 MEQ/100ML IV SOLN
10.0000 meq | INTRAVENOUS | Status: AC
  Administered 2020-05-01 (×3): 10 meq via INTRAVENOUS

## 2020-04-30 MED ORDER — MIDAZOLAM HCL 2 MG/2ML IJ SOLN
1.0000 mg | Freq: Once | INTRAMUSCULAR | Status: AC
  Administered 2020-04-30: 1 mg via INTRAVENOUS

## 2020-04-30 MED ORDER — LAMIVUDINE 150 MG PO TABS
300.0000 mg | ORAL_TABLET | Freq: Every day | ORAL | Status: DC
  Administered 2020-05-01 – 2020-05-06 (×6): 300 mg via ORAL
  Filled 2020-04-30 (×7): qty 2

## 2020-04-30 MED ORDER — KETAMINE HCL 50 MG/5ML IJ SOSY
PREFILLED_SYRINGE | INTRAMUSCULAR | Status: AC
  Filled 2020-04-30: qty 5

## 2020-04-30 MED ORDER — OXYCODONE HCL 5 MG PO TABS
5.0000 mg | ORAL_TABLET | ORAL | Status: DC | PRN
  Administered 2020-04-30 – 2020-05-01 (×2): 10 mg via ORAL
  Filled 2020-04-30 (×2): qty 2

## 2020-04-30 MED ORDER — ENOXAPARIN SODIUM 40 MG/0.4ML ~~LOC~~ SOLN
40.0000 mg | SUBCUTANEOUS | Status: DC
  Administered 2020-05-01 – 2020-05-06 (×6): 40 mg via SUBCUTANEOUS
  Filled 2020-04-30 (×6): qty 0.4

## 2020-04-30 MED ORDER — PROPOFOL 10 MG/ML IV BOLUS
INTRAVENOUS | Status: DC | PRN
  Administered 2020-04-30: 200 mg via INTRAVENOUS
  Administered 2020-04-30: 10 mg via INTRAVENOUS
  Administered 2020-04-30: 20 mg via INTRAVENOUS
  Administered 2020-04-30: 10 mg via INTRAVENOUS

## 2020-04-30 MED ORDER — ORAL CARE MOUTH RINSE: 15.0000 mL | Freq: Once | OROMUCOSAL | Status: AC

## 2020-04-30 MED ORDER — SUGAMMADEX SODIUM 200 MG/2ML IV SOLN
INTRAVENOUS | Status: DC | PRN
  Administered 2020-04-30: 200 mg via INTRAVENOUS

## 2020-04-30 MED ORDER — MIDAZOLAM HCL 2 MG/2ML IJ SOLN
INTRAMUSCULAR | Status: AC
  Filled 2020-04-30: qty 2

## 2020-04-30 MED ORDER — DOLUTEGRAVIR-LAMIVUDINE 50-300 MG PO TABS: 1.0000 | ORAL_TABLET | Freq: Every day | ORAL | Status: DC

## 2020-04-30 MED ORDER — PHENYLEPHRINE HCL-NACL 10-0.9 MG/250ML-% IV SOLN
INTRAVENOUS | Status: DC | PRN
  Administered 2020-04-30: 10 ug/min via INTRAVENOUS

## 2020-04-30 MED ORDER — LACTATED RINGERS IV SOLN: INTRAVENOUS | Status: DC

## 2020-04-30 MED ORDER — HYDROMORPHONE HCL 1 MG/ML IJ SOLN
0.2500 mg | INTRAMUSCULAR | Status: DC | PRN
  Administered 2020-04-30 (×5): 0.5 mg via INTRAVENOUS

## 2020-04-30 MED ORDER — ONDANSETRON 4 MG PO TBDP: 4.0000 mg | ORAL_TABLET | Freq: Four times a day (QID) | ORAL | Status: DC | PRN

## 2020-04-30 MED ORDER — LABETALOL HCL 5 MG/ML IV SOLN
INTRAVENOUS | Status: DC | PRN
  Administered 2020-04-30: 5 mg via INTRAVENOUS

## 2020-04-30 MED ORDER — ROCURONIUM BROMIDE 10 MG/ML (PF) SYRINGE
PREFILLED_SYRINGE | INTRAVENOUS | Status: DC | PRN
  Administered 2020-04-30: 20 mg via INTRAVENOUS
  Administered 2020-04-30: 50 mg via INTRAVENOUS

## 2020-04-30 MED ORDER — FENTANYL CITRATE (PF) 250 MCG/5ML IJ SOLN
INTRAMUSCULAR | Status: AC
  Filled 2020-04-30: qty 5

## 2020-04-30 MED ORDER — HYDROMORPHONE HCL 1 MG/ML IJ SOLN
INTRAMUSCULAR | Status: DC | PRN
  Administered 2020-04-30: .5 mg via INTRAVENOUS

## 2020-04-30 MED ORDER — VENLAFAXINE HCL 37.5 MG PO TABS
37.5000 mg | ORAL_TABLET | Freq: Two times a day (BID) | ORAL | Status: DC
Start: 2020-04-30 — End: 2020-05-07
  Administered 2020-05-01 – 2020-05-06 (×13): 37.5 mg via ORAL
  Filled 2020-04-30 (×14): qty 1

## 2020-04-30 MED ORDER — 0.9 % SODIUM CHLORIDE (POUR BTL) OPTIME
TOPICAL | Status: DC | PRN
  Administered 2020-04-30: 2000 mL

## 2020-04-30 MED ORDER — PHENYLEPHRINE HCL (PRESSORS) 10 MG/ML IV SOLN
INTRAVENOUS | Status: DC | PRN
  Administered 2020-04-30: 80 ug via INTRAVENOUS

## 2020-04-30 MED ORDER — POTASSIUM CHLORIDE 10 MEQ/100ML IV SOLN
10.0000 meq | INTRAVENOUS | Status: DC
  Administered 2020-04-30: 10 meq via INTRAVENOUS
  Filled 2020-04-30 (×4): qty 100

## 2020-04-30 MED ORDER — ALBUMIN HUMAN 5 % IV SOLN: INTRAVENOUS | Status: DC | PRN

## 2020-04-30 MED ORDER — HYDROMORPHONE HCL 1 MG/ML IJ SOLN
INTRAMUSCULAR | Status: AC
  Filled 2020-04-30: qty 0.5

## 2020-04-30 MED ORDER — ONDANSETRON HCL 4 MG/2ML IJ SOLN
4.0000 mg | Freq: Four times a day (QID) | INTRAMUSCULAR | Status: DC | PRN
  Administered 2020-05-03 – 2020-05-05 (×4): 4 mg via INTRAVENOUS
  Filled 2020-04-30 (×4): qty 2

## 2020-04-30 MED ORDER — BUPIVACAINE-EPINEPHRINE (PF) 0.25% -1:200000 IJ SOLN
INTRAMUSCULAR | Status: DC | PRN
  Administered 2020-04-30 (×2): 25 mL

## 2020-04-30 MED ORDER — DOCUSATE SODIUM 100 MG PO CAPS
100.0000 mg | ORAL_CAPSULE | Freq: Two times a day (BID) | ORAL | Status: DC
  Administered 2020-04-30 – 2020-05-06 (×13): 100 mg via ORAL
  Filled 2020-04-30 (×13): qty 1

## 2020-04-30 MED ORDER — CHLORHEXIDINE GLUCONATE 0.12 % MT SOLN: 15.0000 mL | Freq: Once | OROMUCOSAL | Status: AC

## 2020-04-30 MED ORDER — DOLUTEGRAVIR SODIUM 50 MG PO TABS
50.0000 mg | ORAL_TABLET | Freq: Every day | ORAL | Status: DC
  Administered 2020-05-01 – 2020-05-06 (×6): 50 mg via ORAL
  Filled 2020-04-30 (×7): qty 1

## 2020-04-30 MED ORDER — MIDAZOLAM HCL 2 MG/2ML IJ SOLN
INTRAMUSCULAR | Status: DC | PRN
  Administered 2020-04-30: 2 mg via INTRAVENOUS

## 2020-04-30 MED ORDER — FLUTICASONE FUROATE-VILANTEROL 100-25 MCG/INH IN AEPB
1.0000 | INHALATION_SPRAY | Freq: Every day | RESPIRATORY_TRACT | Status: DC
  Administered 2020-05-01 – 2020-05-07 (×7): 1 via RESPIRATORY_TRACT
  Filled 2020-04-30: qty 28

## 2020-04-30 MED ORDER — ONDANSETRON HCL 4 MG/2ML IJ SOLN
INTRAMUSCULAR | Status: DC | PRN
  Administered 2020-04-30: 4 mg via INTRAVENOUS

## 2020-04-30 MED ORDER — CLONIDINE HCL (ANALGESIA) 100 MCG/ML EP SOLN
EPIDURAL | Status: DC | PRN
  Administered 2020-04-30 (×2): 50 ug

## 2020-04-30 MED ORDER — AMISULPRIDE (ANTIEMETIC) 5 MG/2ML IV SOLN: 10.0000 mg | Freq: Once | INTRAVENOUS | Status: DC | PRN

## 2020-04-30 MED ORDER — DEXAMETHASONE SODIUM PHOSPHATE 10 MG/ML IJ SOLN
INTRAMUSCULAR | Status: DC | PRN
  Administered 2020-04-30: 10 mg via INTRAVENOUS

## 2020-04-30 MED ORDER — LIDOCAINE 2% (20 MG/ML) 5 ML SYRINGE
INTRAMUSCULAR | Status: DC | PRN
  Administered 2020-04-30: 60 mg via INTRAVENOUS

## 2020-04-30 MED ORDER — CHLORHEXIDINE GLUCONATE 0.12 % MT SOLN
OROMUCOSAL | Status: AC
  Administered 2020-04-30: 15 mL via OROMUCOSAL
  Filled 2020-04-30: qty 15

## 2020-04-30 MED ORDER — FENTANYL CITRATE (PF) 250 MCG/5ML IJ SOLN
INTRAMUSCULAR | Status: DC | PRN
  Administered 2020-04-30 (×2): 50 ug via INTRAVENOUS
  Administered 2020-04-30: 25 ug via INTRAVENOUS
  Administered 2020-04-30: 50 ug via INTRAVENOUS
  Administered 2020-04-30: 100 ug via INTRAVENOUS
  Administered 2020-04-30: 50 ug via INTRAVENOUS
  Administered 2020-04-30: 25 ug via INTRAVENOUS
  Administered 2020-04-30: 50 ug via INTRAVENOUS

## 2020-04-30 MED ORDER — CEFAZOLIN SODIUM-DEXTROSE 2-4 GM/100ML-% IV SOLN
INTRAVENOUS | Status: AC
  Filled 2020-04-30: qty 100

## 2020-04-30 MED ORDER — LACTATED RINGERS IV SOLN: INTRAVENOUS | Status: DC | PRN

## 2020-04-30 MED ORDER — HYDROMORPHONE HCL 1 MG/ML IJ SOLN
0.5000 mg | INTRAMUSCULAR | Status: DC | PRN
Start: 2020-04-30 — End: 2020-05-07
  Administered 2020-05-01 – 2020-05-04 (×5): 0.5 mg via INTRAVENOUS
  Filled 2020-04-30 (×5): qty 1

## 2020-04-30 MED ORDER — SERTRALINE HCL 50 MG PO TABS
150.0000 mg | ORAL_TABLET | Freq: Every day | ORAL | Status: DC
  Administered 2020-05-01 – 2020-05-06 (×6): 150 mg via ORAL
  Filled 2020-04-30 (×7): qty 1

## 2020-04-30 MED ORDER — FLUTICASONE FUROATE-VILANTEROL 100-25 MCG/INH IN AEPB: 2.0000 | INHALATION_SPRAY | Freq: Every day | RESPIRATORY_TRACT | Status: DC

## 2020-04-30 SURGICAL SUPPLY — 81 items
BIOPATCH RED 1 DISK 7.0 (GAUZE/BANDAGES/DRESSINGS) IMPLANT
BLADE CLIPPER SURG (BLADE) IMPLANT
BOOT SUTURE AID YELLOW STND (SUTURE) IMPLANT
CANISTER SUCT 3000ML PPV (MISCELLANEOUS) ×4 IMPLANT
CHLORAPREP W/TINT 26 (MISCELLANEOUS) ×4 IMPLANT
CLIP VESOCCLUDE MED 24/CT (CLIP) ×4 IMPLANT
CLIP VESOCCLUDE MED 6/CT (CLIP) ×8 IMPLANT
CLIP VESOCCLUDE SM WIDE 6/CT (CLIP) ×4 IMPLANT
CNTNR URN SCR LID CUP LEK RST (MISCELLANEOUS) IMPLANT
CONT SPEC 4OZ STRL OR WHT (MISCELLANEOUS)
COVER SURGICAL LIGHT HANDLE (MISCELLANEOUS) ×4 IMPLANT
COVER WAND RF STERILE (DRAPES) ×4 IMPLANT
DERMABOND ADVANCED (GAUZE/BANDAGES/DRESSINGS) ×2
DERMABOND ADVANCED .7 DNX12 (GAUZE/BANDAGES/DRESSINGS) ×6 IMPLANT
DRAIN CHANNEL 19F RND (DRAIN) IMPLANT
DRAPE INCISE IOBAN 66X45 STRL (DRAPES) IMPLANT
DRAPE LAPAROSCOPIC ABDOMINAL (DRAPES) ×4 IMPLANT
DRAPE POUCH INSTRU U-SHP 10X18 (DRAPES) IMPLANT
DRAPE WARM FLUID 44X44 (DRAPES) ×8 IMPLANT
DRSG TEGADERM 4X4.75 (GAUZE/BANDAGES/DRESSINGS) IMPLANT
ELECT BLADE 4.0 EZ CLEAN MEGAD (MISCELLANEOUS) ×4
ELECT BLADE 6.5 EXT (BLADE) ×4 IMPLANT
ELECT CAUTERY BLADE 6.4 (BLADE) ×4 IMPLANT
ELECT PAD DSPR THERM+ ADLT (MISCELLANEOUS) ×4 IMPLANT
ELECT REM PT RETURN 9FT ADLT (ELECTROSURGICAL) ×4
ELECTRODE BLDE 4.0 EZ CLN MEGD (MISCELLANEOUS) ×3 IMPLANT
ELECTRODE REM PT RTRN 9FT ADLT (ELECTROSURGICAL) ×3 IMPLANT
EVACUATOR SILICONE 100CC (DRAIN) IMPLANT
GAUZE 4X4 16PLY RFD (DISPOSABLE) IMPLANT
GAUZE SPONGE 4X4 16PLY XRAY LF (GAUZE/BANDAGES/DRESSINGS) IMPLANT
GLOVE BIOGEL PI IND STRL 6 (GLOVE) ×3 IMPLANT
GLOVE BIOGEL PI INDICATOR 6 (GLOVE) ×1
GLOVE BIOGEL PI MICRO 5.5 (GLOVE) ×3
GLOVE BIOGEL PI MICRO STRL 5.5 (GLOVE) ×9 IMPLANT
GOWN STRL REUS W/ TWL LRG LVL3 (GOWN DISPOSABLE) ×6 IMPLANT
GOWN STRL REUS W/TWL LRG LVL3 (GOWN DISPOSABLE) ×2
HAND PENCIL TRP OPTION (MISCELLANEOUS) ×4 IMPLANT
HANDLE SUCTION POOLE (INSTRUMENTS) ×3 IMPLANT
HEMOSTAT HEMOBLAST BELLOWS (HEMOSTASIS) ×4 IMPLANT
KIT BASIN OR (CUSTOM PROCEDURE TRAY) ×4 IMPLANT
KIT TURNOVER KIT B (KITS) ×4 IMPLANT
LIGASURE IMPACT 36 18CM CVD LR (INSTRUMENTS) IMPLANT
NS IRRIG 1000ML POUR BTL (IV SOLUTION) ×8 IMPLANT
PACK GENERAL/GYN (CUSTOM PROCEDURE TRAY) ×4 IMPLANT
PAD ARMBOARD 7.5X6 YLW CONV (MISCELLANEOUS) ×8 IMPLANT
PENCIL SMOKE EVACUATOR (MISCELLANEOUS) ×4 IMPLANT
PLUG CATH AND CAP STER (CATHETERS) IMPLANT
SCISSORS LAP 5X35 DISP (ENDOMECHANICALS) IMPLANT
SEALER BIPOLAR AQUA 6.0 (INSTRUMENTS) ×4 IMPLANT
SET IRRIG TUBING LAPAROSCOPIC (IRRIGATION / IRRIGATOR) IMPLANT
SET TUBE SMOKE EVAC HIGH FLOW (TUBING) ×4 IMPLANT
SLEEVE ENDOPATH XCEL 5M (ENDOMECHANICALS) ×4 IMPLANT
SLEEVE SUCTION CATH 165 (SLEEVE) IMPLANT
SPONGE LAP 18X18 RF (DISPOSABLE) ×4 IMPLANT
SUCTION POOLE HANDLE (INSTRUMENTS) ×4
SUT CHROMIC 0 BP (SUTURE) ×8 IMPLANT
SUT CHROMIC 3 0 CT 36 (SUTURE) IMPLANT
SUT ETHILON 2 0 FS 18 (SUTURE) IMPLANT
SUT MNCRL AB 4-0 PS2 18 (SUTURE) ×8 IMPLANT
SUT PDS AB 1 TP1 96 (SUTURE) IMPLANT
SUT PROLENE 2 0 SH DA (SUTURE) IMPLANT
SUT PROLENE 3 0 PS 2 (SUTURE) ×16 IMPLANT
SUT PROLENE 4 0 RB 1 (SUTURE)
SUT PROLENE 4-0 RB1 .5 CRCL 36 (SUTURE) IMPLANT
SUT SILK 2 0 (SUTURE) ×1
SUT SILK 2 0 SH CR/8 (SUTURE) IMPLANT
SUT SILK 2 0 TIES 10X30 (SUTURE) ×4 IMPLANT
SUT SILK 2-0 18XBRD TIE 12 (SUTURE) ×3 IMPLANT
SUT SILK 3 0 SH CR/8 (SUTURE) IMPLANT
SUT SILK 3 0 TIES 10X30 (SUTURE) IMPLANT
SUT VIC AB 3-0 SH 27 (SUTURE) ×2
SUT VIC AB 3-0 SH 27X BRD (SUTURE) ×6 IMPLANT
SUT VIC AB 3-0 SH 27XBRD (SUTURE) IMPLANT
SYR BULB IRRIG 60ML STRL (SYRINGE) ×4 IMPLANT
TOWEL GREEN STERILE (TOWEL DISPOSABLE) ×4 IMPLANT
TOWEL GREEN STERILE FF (TOWEL DISPOSABLE) ×4 IMPLANT
TRAY FOLEY MTR SLVR 14FR STAT (SET/KITS/TRAYS/PACK) ×4 IMPLANT
TRAY LAPAROSCOPIC MC (CUSTOM PROCEDURE TRAY) ×4 IMPLANT
TROCAR XCEL BLUNT TIP 100MML (ENDOMECHANICALS) ×4 IMPLANT
TROCAR XCEL NON-BLD 5MMX100MML (ENDOMECHANICALS) IMPLANT
TUBE CONNECTING 12X1/4 (SUCTIONS) ×4 IMPLANT

## 2020-04-30 NOTE — Plan of Care (Signed)

## 2020-04-30 NOTE — Anesthesia Procedure Notes (Signed)
Procedure Name: Intubation Date/Time: 04/30/2020 1:21 PM Performed by: Clearnce Sorrel, CRNA Pre-anesthesia Checklist: Patient identified, Emergency Drugs available, Suction available, Patient being monitored and Timeout performed Patient Re-evaluated:Patient Re-evaluated prior to induction Oxygen Delivery Method: Circle system utilized Preoxygenation: Pre-oxygenation with 100% oxygen Induction Type: IV induction Ventilation: Mask ventilation without difficulty Laryngoscope Size: Mac and 3 Grade View: Grade III Tube type: Oral Tube size: 7.0 mm Number of attempts: 1 Airway Equipment and Method: Stylet Placement Confirmation: positive ETCO2 and breath sounds checked- equal and bilateral Secured at: 23 cm Tube secured with: Tape Dental Injury: Teeth and Oropharynx as per pre-operative assessment

## 2020-04-30 NOTE — Transfer of Care (Signed)
Immediate Anesthesia Transfer of Care Note  Patient: Teresa [name]  Procedure(s) Performed: LAPAROSCOPY DIAGNOSTIC (N/A Abdomen) OPEN PARTIAL RIGHT HEPATECTOMY (Right Abdomen)  Patient Location: PACU  Anesthesia Type:General  Level of Consciousness: awake, alert  and oriented  Airway & Oxygen Therapy: Patient Spontanous Breathing  Post-op Assessment: Report given to RN and Post -op Vital signs reviewed and stable  Post vital signs: Reviewed and stable  Last Vitals:  Vitals Value Taken Time  BP 133/94 04/30/20 1653  Temp    Pulse 82 04/30/20 1655  Resp 19 04/30/20 1655  SpO2 100 % 04/30/20 1655  Vitals shown include unvalidated device data.  Last Pain:  Vitals:   04/30/20 1135  TempSrc: Oral  PainSc: 6       Patients Stated Pain Goal: 2 (04/30/20 1135)  Complications: No complications documented.

## 2020-04-30 NOTE — Anesthesia Preprocedure Evaluation (Addendum)
Anesthesia Evaluation  Patient identified by MRN, date of birth, ID band Patient awake    Reviewed: Allergy & Precautions, NPO status , Patient's Chart, lab work & pertinent test results  Airway Mallampati: II  TM Distance: >3 FB Neck ROM: Full    Dental  (+) Dental Advisory Given   Pulmonary asthma , former smoker,    breath sounds clear to auscultation       Cardiovascular hypertension, Pt. on medications  Rhythm:Regular Rate:Normal     Neuro/Psych  Headaches,    GI/Hepatic GERD  ,(+) Hepatitis -, C  Endo/Other  negative endocrine ROS  Renal/GU negative Renal ROS     Musculoskeletal  (+) Arthritis ,   Abdominal   Peds  Hematology  (+) anemia , HIV,   Anesthesia Other Findings   Reproductive/Obstetrics                             Lab Results  Component Value Date   WBC 3.2 (L) 03/11/2020   HGB 14.0 03/11/2020   HCT 41.1 03/11/2020   MCV 88.8 03/11/2020   PLT 164 03/11/2020   Lab Results  Component Value Date   CREATININE 0.88 03/11/2020   BUN 8 03/11/2020   NA 138 03/11/2020   K 3.7 03/11/2020   CL 103 03/11/2020   CO2 26 03/11/2020    Anesthesia Physical Anesthesia Plan  ASA: III  Anesthesia Plan: General   Post-op Pain Management:  Regional for Post-op pain   Induction: Intravenous  PONV Risk Score and Plan: 3 and Dexamethasone, Ondansetron and Treatment may vary due to age or medical condition  Airway Management Planned: Oral ETT  Additional Equipment: Arterial line  Intra-op Plan:   Post-operative Plan: Extubation in OR  Informed Consent: I have reviewed the patients History and Physical, chart, labs and discussed the procedure including the risks, benefits and alternatives for the proposed anesthesia with the patient or authorized representative who has indicated his/her understanding and acceptance.     Dental advisory given  Plan Discussed with:  CRNA  Anesthesia Plan Comments:       Anesthesia Quick Evaluation

## 2020-04-30 NOTE — Anesthesia Procedure Notes (Signed)
Arterial Line Insertion Start/End1/08/2020 12:20 PM, 04/30/2020 12:25 PM Performed by: Jed Limerick, CRNA  Patient location: Pre-op. Preanesthetic checklist: patient identified, IV checked, site marked, risks and benefits discussed, surgical consent, monitors and equipment checked, pre-op evaluation, timeout performed and anesthesia consent Lidocaine 1% used for infiltration Left, radial was placed Catheter size: 20 Fr Hand hygiene performed  and maximum sterile barriers used   Attempts: 1 Procedure performed without using ultrasound guided technique. Following insertion, dressing applied. Post procedure assessment: normal and unchanged

## 2020-04-30 NOTE — OR Nursing (Signed)
Pt is awake,alert and oriented.Pt and/or family verbalized understanding of poc and discharge instructions. Reviewed admission and on going care with receiving RN. Pt is in NAD at this time and is ready to be transferred to floor. Will con't to monitor until pt is transferred. Belongings on bed with patient Pt on Monitor  

## 2020-04-30 NOTE — Interval H&P Note (Signed)
History and Physical Interval Note:  04/30/2020 12:05 PM  Teresa [name]  has presented today for surgery, with the diagnosis of HEPATOCELLULAR CARCINOMA.  The various methods of treatment have been discussed with the patient and family. After consideration of risks, benefits and other options for treatment, the patient has consented to  Procedure(s): LAPAROSCOPY DIAGNOSTIC (N/A) LAPAROSCOPIC POSSIBLE OPEN PARTIAL RIGHT HEPATECTOMY (Right) as a surgical intervention. I have discussed that this will likely be an open resection given the tumor location in segment 7. The patient expressed understanding. All questions were answered. Proceed to OR, admit to inpatient postoperatively.  Sophronia Simas, MD [facility], Hepatobiliary and Pancreatic Surgery 04/30/20 12:06 PM

## 2020-04-30 NOTE — Progress Notes (Signed)
   04/30/20 2100  Vitals  Temp 98.2 F (36.8 C)  Temp Source Oral  BP 102/81  MAP (mmHg) 90  BP Location Right Arm  BP Method Automatic  Patient Position (if appropriate) Lying  Pulse Rate 74  ECG Heart Rate 77  Resp 19  Level of Consciousness  Level of Consciousness Alert  Oxygen Therapy  SpO2 100 %  O2 Device Room Air   The patient is received from PACU to 4 N progressive  14.  A & O x 4. No acute distress noted. The patient is oriented to her room,call bell/ascom and staff. Full assessment to epic completed. Will continue to monitor.

## 2020-04-30 NOTE — Anesthesia Procedure Notes (Addendum)
Anesthesia Regional Block: TAP block   Pre-Anesthetic Checklist: ,, timeout performed, Correct Patient, Correct Site, Correct Laterality, Correct Procedure, Correct Position, site marked, Risks and benefits discussed,  Surgical consent,  Pre-op evaluation,  At surgeon's request and post-op pain management  Laterality: Right and Left  Prep: chloraprep       Needles:  Injection technique: Single-shot  Needle Type: Echogenic Needle     Needle Length: 9cm  Needle Gauge: 21     Additional Needles:   Procedures:,,,, ultrasound used (permanent image in chart),,,,  Narrative:  Start time: 04/30/2020 4:20 PM End time: 04/30/2020 4:31 PM Injection made incrementally with aspirations every 5 mL.  Performed by: Personally  Anesthesiologist: Marcene Duos, MD

## 2020-05-01 ENCOUNTER — Encounter (HOSPITAL_COMMUNITY): Payer: Self-pay | Admitting: Surgery

## 2020-05-01 LAB — COMPREHENSIVE METABOLIC PANEL
ALT: 85 U/L — ABNORMAL HIGH (ref 0–44)
AST: 216 U/L — ABNORMAL HIGH (ref 15–41)
Albumin: 3 g/dL — ABNORMAL LOW (ref 3.5–5.0)
Alkaline Phosphatase: 68 U/L (ref 38–126)
Anion gap: 10 (ref 5–15)
BUN: 7 mg/dL (ref 6–20)
CO2: 24 mmol/L (ref 22–32)
Calcium: 7.9 mg/dL — ABNORMAL LOW (ref 8.9–10.3)
Chloride: 97 mmol/L — ABNORMAL LOW (ref 98–111)
Creatinine, Ser: 0.92 mg/dL (ref 0.44–1.00)
GFR, Estimated: >60 mL/min (ref >60)
Glucose, Bld: 173 mg/dL — ABNORMAL HIGH (ref 70–99)
Potassium: 4.3 mmol/L (ref 3.5–5.1)
Sodium: 131 mmol/L — ABNORMAL LOW (ref 135–145)
Total Bilirubin: 1.2 mg/dL (ref 0.3–1.2)
Total Protein: 5.9 g/dL — ABNORMAL LOW (ref 6.5–8.1)

## 2020-05-01 LAB — CBC
HCT: 34.1 % — ABNORMAL LOW (ref 36.0–46.0)
Hemoglobin: 11 g/dL — ABNORMAL LOW (ref 12.0–15.0)
MCH: 29.1 pg (ref 26.0–34.0)
MCHC: 32.3 g/dL (ref 30.0–36.0)
MCV: 90.2 fL (ref 80.0–100.0)
Platelets: 131 10*3/uL — ABNORMAL LOW (ref 150–400)
RBC: 3.78 MIL/uL — ABNORMAL LOW (ref 3.87–5.11)
RDW: 13.3 % (ref 11.5–15.5)
WBC: 12.9 10*3/uL — ABNORMAL HIGH (ref 4.0–10.5)
nRBC: 0 % (ref 0.0–0.2)

## 2020-05-01 LAB — PHOSPHORUS: Phosphorus: 3.4 mg/dL (ref 2.5–4.6)

## 2020-05-01 MED ORDER — OXYCODONE HCL 5 MG PO TABS
5.0000 mg | ORAL_TABLET | ORAL | Status: DC | PRN
  Administered 2020-05-01 – 2020-05-06 (×13): 10 mg via ORAL
  Administered 2020-05-06 – 2020-05-07 (×3): 5 mg via ORAL
  Filled 2020-05-01: qty 2
  Filled 2020-05-01: qty 1
  Filled 2020-05-01 (×6): qty 2
  Filled 2020-05-01: qty 1
  Filled 2020-05-01 (×6): qty 2
  Filled 2020-05-01: qty 1
  Filled 2020-05-01: qty 2

## 2020-05-01 MED ORDER — DIAZEPAM 2 MG PO TABS: 2.0000 mg | ORAL_TABLET | Freq: Three times a day (TID) | ORAL | Status: DC | PRN

## 2020-05-01 MED ORDER — CHLORHEXIDINE GLUCONATE CLOTH 2 % EX PADS
6.0000 | MEDICATED_PAD | Freq: Every day | CUTANEOUS | Status: DC
  Administered 2020-05-01: 6 via TOPICAL

## 2020-05-01 MED ORDER — DIAZEPAM 2 MG PO TABS
2.0000 mg | ORAL_TABLET | Freq: Four times a day (QID) | ORAL | Status: DC | PRN
  Administered 2020-05-01 – 2020-05-03 (×3): 2 mg via ORAL
  Filled 2020-05-01 (×3): qty 1

## 2020-05-01 MED ORDER — SODIUM CHLORIDE 0.9 % IV SOLN: INTRAVENOUS | Status: DC

## 2020-05-01 NOTE — Progress Notes (Signed)
1 Day Post-Op  Subjective: Afebrile, vitals stable. Labs unremarkable. Patient having difficulty with pain control this morning. No nausea or vomiting.   Objective: Vital signs in last 24 hours: Temp:  [97.4 F (36.3 C)-98.8 F (37.1 C)] 98.8 F (37.1 C) (01/06 0358) Pulse Rate:  [66-88] 85 (01/06 0644) Resp:  [10-23] 20 (01/06 0644) BP: (101-136)/(44-100) 127/100 (01/06 0225) SpO2:  [92 %-100 %] 98 % (01/06 0644) Weight:  [79.4 kg] 79.4 kg (01/05 1135) Last BM Date: 04/29/20  Intake/Output from previous day: 01/05 0701 - 01/06 0700 In: 1150 [I.V.:900; IV Piggyback:250] Out: 1950 [Urine:1550; Blood:400] Intake/Output this shift: No intake/output data recorded.  PE: General: resting comfortably, NAD Neuro: alert and oriented, no focal deficits Resp: normal work of breathing CV: RRR Abdomen: soft, nondistended, appropriately tender to palpation. Subcostal incision c/d/i with no erythema, induration or drainage. Extremities: warm and well-perfused   Lab Results:  Recent Labs    04/30/20 1728 05/01/20 0016  WBC 11.9* 12.9*  HGB 11.4* 11.0*  HCT 35.1* 34.1*  PLT 150 131*   BMET Recent Labs    04/30/20 1728 05/01/20 0016  NA 136 131*  K 3.2* 4.3  CL 102 97*  CO2 25 24  GLUCOSE 191* 173*  BUN 6 7  CREATININE 0.82 0.92  CALCIUM 8.1* 7.9*   PT/INR Recent Labs    04/30/20 1126  LABPROT 13.9  INR 1.1   CMP     Component Value Date/Time   NA 131 (L) 05/01/2020 0016   NA 140 11/30/2019 0909   K 4.3 05/01/2020 0016   CL 97 (L) 05/01/2020 0016   CO2 24 05/01/2020 0016   GLUCOSE 173 (H) 05/01/2020 0016   BUN 7 05/01/2020 0016   BUN 8 11/30/2019 0909   CREATININE 0.92 05/01/2020 0016   CREATININE 0.88 03/11/2020 0000   CALCIUM 7.9 (L) 05/01/2020 0016   PROT 5.9 (L) 05/01/2020 0016   PROT 7.0 11/30/2019 0909   ALBUMIN 3.0 (L) 05/01/2020 0016   ALBUMIN 3.8 11/30/2019 0909   AST 216 (H) 05/01/2020 0016   ALT 85 (H) 05/01/2020 0016   ALKPHOS 68  05/01/2020 0016   BILITOT 1.2 05/01/2020 0016   BILITOT 0.8 11/30/2019 0909   GFRNONAA >60 05/01/2020 0016   GFRNONAA 79 03/11/2020 0000   GFRAA 91 03/11/2020 0000   Lipase     Component Value Date/Time   LIPASE 65 (H) 04/27/2019 0600       Studies/Results: No results found.  Anti-infectives: Anti-infectives (From admission, onward)   Start     Dose/Rate Route Frequency Ordered Stop   05/01/20 1000  Dolutegravir-lamiVUDine 50-300 MG TABS 1 tablet  Status:  Discontinued        1 tablet Oral Daily 04/30/20 1629 04/30/20 2103   05/01/20 1000  dolutegravir (TIVICAY) tablet 50 mg       "And" Linked Group Details   50 mg Oral Daily 04/30/20 2103     05/01/20 1000  lamiVUDine (EPIVIR) tablet 300 mg       "And" Linked Group Details   300 mg Oral Daily 04/30/20 2103     04/30/20 1131  ceFAZolin (ANCEF) 2-4 GM/100ML-% IVPB       Note to Pharmacy: Wilburn Mylar   : cabinet override      04/30/20 1131 04/30/20 1333   04/30/20 1130  ceFAZolin (ANCEF) IVPB 2g/100 mL premix        2 g 200 mL/hr over 30 Minutes Intravenous On call to O.R.  04/30/20 1126 04/30/20 1323       Assessment/Plan 50 yo female with HCV and new LiRADS-5 lesion on MRI, POD1 s/p open wedge resection of right liver. - Multimodal pain control: prn oxycodone, dilaudid, valium for muscle spasms. - Advance to regular diet - IVF at 50 ml/hr - Remove foley - VTE: lovenox, SCDs - Dispo: transfer to med-surg floor   LOS: 1 day    Michaelle Birks, MD [facility] Surgery General, Hepatobiliary and Pancreatic Surgery 05/01/20 7:24 AM

## 2020-05-01 NOTE — Op Note (Signed)
Date: 04/30/2020  Patient: Teresa [name] MRN: [id number]  Preoperative Diagnosis: Hepatocellular carcinoma Postoperative Diagnosis: Same  Procedure: 1. Diagnostic laparoscopy 2. Open wedge resection of right liver (segment 7) 3. Intraoperative ultrasound  Surgeon: Michaelle Birks, MD Assistant: Nedra Hai, MD   EBL: 400 mL  Anesthesia: General  Specimens: Omental nodule; segment 7 liver lesion  Indications: Teresa [name] is a 50 yo female with a history of HCV, who was noted to have an elevated AFP. Liver MRI showed a new arterially-enhancing lesion in the dome of the liver, read a LiRADS-5 lesion consistent with [place]. The lesion was amenable to surgical resection and after a discussion of the risks and benefits of surgery, the patient agreed to proceed.  Findings: Small hyperechoic liver lesion in segment 7 corresponding to the area of concern on MRI, but no discrete mass was identified. This area was resected. No other suspicious lesions or masses in the liver. Liver appeared steatotic but not cirrhotic.  Procedure details: Informed consent was obtained in the preoperative area prior to the procedure. The patient was brought to the operating room and placed on the table in the supine position. General anesthesia was induced and appropriate lines and drains were placed for intraoperative monitoring. Perioperative antibiotics were administered per SCIP guidelines. The abdomen was prepped and draped in the usual sterile fashion. A pre-procedure timeout was taken verifying patient identity, surgical site and procedure to be performed.  A small skin incision was made in the RUQ. The subcutaneous tissue was divided with cautery, the anterior rectus sheath was incised, and the rectus muscle was spread to expose the posterior rectus sheath. The posterior sheath was elevated and incised to enter the peritoneal cavity. A 58mm Hassan trocar was placed and the abdomen was insufflated. No obvious liver  nodules or masses were identified. There were no peritoneal nodules. The omentum appeared chronically inflamed, likely from where it had been adherent to the previously placed mesh. A 2mm port was placed in the LUQ under direct visualization and an omental nodule was taken and sent for frozen analysis, which showed fibrosis but no malignant cells. The ports were removed and the abdomen desufflated. A right subcostal skin incision was made with an upper midline extension. The subcutaneous tissue was divided with cautery. The fascia was incised and opened to expose the underlying muscle. The linea alba was opened and the peritoneal cavity was entered. The falciform ligament was ligated with 2-0 silk ties and divided. The fascia and muscle at the subcostal portion of the incision were opened. The falciform ligament and right triangular ligaments were taken down with cautery to mobilize the right lobe of the liver. The liver appeared steatotic but not cirrhotic. An ultrasound was performed of the entire liver. There was a small hyperechoic lesion, approximately 1cm in diameter with poorly-defined borders in the dome at segment 7, corresponding to the location of the LiRADS-5 lesion on MRI. This was compared intraop to the MRI. No other liver masses or lesions were identified on intraop ultrasound. Bimanual palpation of the liver was performed and no masses were palpated. The identified lesion at segment 7 was again identified with the ultrasound and a wide resection margin around this lesion was marked on the surface of the liver using cautery. 0 chromic stay sutures were placed in the planned specimen and the adjacent liver to aid in retraction. A wedge resection of this area was then performed, using a crush-clamp technique to divide the liver parenchyma. Small vessels and ducts  were clipped and hemostasis was achieved with an aquamantys. The specimen was removed and partially transected on the back table. No discrete  masses were visualized. A thorough ultrasound of the liver was again performed, focusing on the right lobe, and no further masses or abnormalities were identified. The preoperative MRI was again reviewed and it was felt that the area of concern on the MRI had been resected. With no further abnormalities identified on intraop ultrasound I did not feel that it was prudent to further resect any more liver parenchyma. The margin of resection appeared hemostatic with no evidence of bile leak. Hemoblast was placed on the cut surface of liver. The retractors and lap pads were removed. The fascia was closed in 2 layers with a running 1 PDS. Scarpa's fascia was closed with a running 3-0 Vicryl suture and the skin was closed with a running subcuticular 4-0 monocryl suture. Dermabond was applied.  The patient tolerated the procedure well with no apparent complications. All counts were correct x2 at the end of the procedure. The patient was extubated and taken to PACU in stable condition.  Sophronia Simas, MD 05/01/20 11:11 AM

## 2020-05-01 NOTE — Anesthesia Postprocedure Evaluation (Signed)
Anesthesia Post Note  Patient: Teresa [name]  Procedure(s) Performed: LAPAROSCOPY DIAGNOSTIC (N/A Abdomen) OPEN PARTIAL RIGHT HEPATECTOMY (Right Abdomen)     Patient location during evaluation: PACU Anesthesia Type: General Level of consciousness: awake and alert Pain management: pain level controlled Vital Signs Assessment: post-procedure vital signs reviewed and stable Respiratory status: spontaneous breathing, nonlabored ventilation, respiratory function stable and patient connected to nasal cannula oxygen Cardiovascular status: blood pressure returned to baseline and stable Postop Assessment: no apparent nausea or vomiting Anesthetic complications: no   No complications documented.  Last Vitals:  Vitals:   05/01/20 0908 05/01/20 1112  BP:  124/69  Pulse:  75  Resp:  18  Temp:  36.6 C  SpO2: 99% 97%    Last Pain:  Vitals:   05/01/20 1414  TempSrc:   PainSc: 0-No pain                 Kennieth Rad

## 2020-05-02 LAB — CBC
HCT: 28.7 % — ABNORMAL LOW (ref 36.0–46.0)
Hemoglobin: 9.3 g/dL — ABNORMAL LOW (ref 12.0–15.0)
MCH: 28.7 pg (ref 26.0–34.0)
MCHC: 32.4 g/dL (ref 30.0–36.0)
MCV: 88.6 fL (ref 80.0–100.0)
Platelets: 120 10*3/uL — ABNORMAL LOW (ref 150–400)
RBC: 3.24 MIL/uL — ABNORMAL LOW (ref 3.87–5.11)
RDW: 13.5 % (ref 11.5–15.5)
WBC: 14 10*3/uL — ABNORMAL HIGH (ref 4.0–10.5)
nRBC: 0 % (ref 0.0–0.2)

## 2020-05-02 LAB — COMPREHENSIVE METABOLIC PANEL
ALT: 87 U/L — ABNORMAL HIGH (ref 0–44)
AST: 175 U/L — ABNORMAL HIGH (ref 15–41)
Albumin: 3 g/dL — ABNORMAL LOW (ref 3.5–5.0)
Alkaline Phosphatase: 72 U/L (ref 38–126)
Anion gap: 10 (ref 5–15)
BUN: 8 mg/dL (ref 6–20)
CO2: 29 mmol/L (ref 22–32)
Calcium: 8.4 mg/dL — ABNORMAL LOW (ref 8.9–10.3)
Chloride: 100 mmol/L (ref 98–111)
Creatinine, Ser: 0.78 mg/dL (ref 0.44–1.00)
GFR, Estimated: >60 mL/min (ref >60)
Glucose, Bld: 132 mg/dL — ABNORMAL HIGH (ref 70–99)
Potassium: 3.8 mmol/L (ref 3.5–5.1)
Sodium: 139 mmol/L (ref 135–145)
Total Bilirubin: 0.9 mg/dL (ref 0.3–1.2)
Total Protein: 5.7 g/dL — ABNORMAL LOW (ref 6.5–8.1)

## 2020-05-02 LAB — PHOSPHORUS: Phosphorus: 2.3 mg/dL — ABNORMAL LOW (ref 2.5–4.6)

## 2020-05-02 MED ORDER — HYDROCHLOROTHIAZIDE 25 MG PO TABS
25.0000 mg | ORAL_TABLET | Freq: Every day | ORAL | Status: DC
  Administered 2020-05-02 – 2020-05-06 (×5): 25 mg via ORAL
  Filled 2020-05-02 (×5): qty 1

## 2020-05-02 MED ORDER — POLYETHYLENE GLYCOL 3350 17 G PO PACK
17.0000 g | PACK | Freq: Every day | ORAL | Status: DC
  Administered 2020-05-02 – 2020-05-06 (×5): 17 g via ORAL
  Filled 2020-05-02 (×5): qty 1

## 2020-05-02 MED ORDER — DOCUSATE SODIUM 100 MG PO CAPS: 100.0000 mg | ORAL_CAPSULE | Freq: Two times a day (BID) | ORAL | 0 refills | Status: DC

## 2020-05-02 MED ORDER — OXYCODONE HCL 5 MG PO TABS: 5.0000 mg | ORAL_TABLET | ORAL | 0 refills | Status: DC | PRN

## 2020-05-02 MED ORDER — KETOROLAC TROMETHAMINE 15 MG/ML IJ SOLN
15.0000 mg | Freq: Three times a day (TID) | INTRAMUSCULAR | Status: AC
  Administered 2020-05-02 – 2020-05-03 (×6): 15 mg via INTRAVENOUS
  Filled 2020-05-02 (×6): qty 1

## 2020-05-02 MED ORDER — POLYETHYLENE GLYCOL 3350 17 G PO PACK: 17.0000 g | PACK | Freq: Every day | ORAL | 0 refills | Status: DC

## 2020-05-02 MED ORDER — DIPHENHYDRAMINE HCL 25 MG PO CAPS
25.0000 mg | ORAL_CAPSULE | Freq: Four times a day (QID) | ORAL | Status: DC | PRN
  Administered 2020-05-02 – 2020-05-04 (×3): 25 mg via ORAL
  Filled 2020-05-02 (×3): qty 1

## 2020-05-02 MED ORDER — SUMATRIPTAN SUCCINATE 50 MG PO TABS
50.0000 mg | ORAL_TABLET | Freq: Two times a day (BID) | ORAL | Status: DC | PRN
  Administered 2020-05-02 – 2020-05-05 (×3): 50 mg via ORAL
  Filled 2020-05-02 (×5): qty 1

## 2020-05-02 MED ORDER — DIAZEPAM 2 MG PO TABS: 2.0000 mg | ORAL_TABLET | Freq: Four times a day (QID) | ORAL | 0 refills | Status: DC | PRN

## 2020-05-02 NOTE — Discharge Instructions (Signed)
[facility] DISCHARGE INSTRUCTIONS  Activity  No heavy lifting greater than 10 pounds for 6 weeks after surgery.  Ok to shower, but do not bathe or submerge incisions underwater.  Do not drive while taking narcotic pain medication.  Pain Control  It is normal to have pain at your incision. This will slowly get better over time.  Take the pain medication as prescribed. Reserve narcotics (oxycodone) for the the most severe pain.   Wound Care  Your incision is covered with skin glue called Dermabond. This will peel off on its own over time.  You may shower and allow warm soapy water to run over your incisions. Gently pat dry.  Do not submerge your incision underwater.  Monitor your incision for any new redness, tenderness, or drainage.  When to Call us:  Fever greater than 100.5  New redness, drainage, or swelling at incision site  Severe pain, nausea, or vomiting  Jaundice (yellowing of the whites of the eyes or skin)  Follow-up You have an appointment scheduled with Dr. Zenia Resides on May 21, 2020 at 3:45pm. This will be at the [facility] Surgery office at [address] [place], [place], [place]. Please arrive at least 15 minutes prior to your scheduled appointment time.  For questions or concerns, please call the office at [phone].

## 2020-05-02 NOTE — Progress Notes (Signed)
Patient complaining of "seeing spots" which she states usually comes on prior to a migraine. Pt BP and temp elevated, BP 159/83, temp 99.7. MD notified and at home meds requested. Will continue to monitor.

## 2020-05-02 NOTE — Progress Notes (Addendum)
2 Days Post-Op  Subjective: Afebrile, vitals stable. LFTs downtrending appropriately. Patient still having some difficulty with pain control, especially when taking deep breaths. She ambulated yesterday. Tolerating PO, no nausea/vomiting. No bowel movement yet.   Objective: Vital signs in last 24 hours: Temp:  [97.6 F (36.4 C)-98.4 F (36.9 C)] 97.6 F (36.4 C) (01/07 0531) Pulse Rate:  [75-86] 77 (01/07 0531) Resp:  [17-22] 17 (01/07 0531) BP: (122-147)/(69-89) 147/89 (01/07 0531) SpO2:  [95 %-100 %] 100 % (01/07 0531) Last BM Date: 04/29/20  Intake/Output from previous day: 01/06 0701 - 01/07 0700 In: 1379.9 [P.O.:720; I.V.:659.9] Out: -  Intake/Output this shift: No intake/output data recorded.  PE: General: resting comfortably, NAD Neuro: alert and oriented, no focal deficits Resp: normal work of breathing on room air Abdomen: soft, nondistended, appropriately tender to palpation. Subcostal incision c/d/i, very mild ecchymosis at mid-portion consistent with a small subcutaneous hematoma. Extremities: warm and well-perfused   Lab Results:  Recent Labs    05/01/20 0016 05/02/20 0345  WBC 12.9* 14.0*  HGB 11.0* 9.3*  HCT 34.1* 28.7*  PLT 131* 120*   BMET Recent Labs    05/01/20 0016 05/02/20 0345  NA 131* 139  K 4.3 3.8  CL 97* 100  CO2 24 29  GLUCOSE 173* 132*  BUN 7 8  CREATININE 0.92 0.78  CALCIUM 7.9* 8.4*   PT/INR Recent Labs    04/30/20 1126  LABPROT 13.9  INR 1.1   CMP     Component Value Date/Time   NA 139 05/02/2020 0345   NA 140 11/30/2019 0909   K 3.8 05/02/2020 0345   CL 100 05/02/2020 0345   CO2 29 05/02/2020 0345   GLUCOSE 132 (H) 05/02/2020 0345   BUN 8 05/02/2020 0345   BUN 8 11/30/2019 0909   CREATININE 0.78 05/02/2020 0345   CREATININE 0.88 03/11/2020 0000   CALCIUM 8.4 (L) 05/02/2020 0345   PROT 5.7 (L) 05/02/2020 0345   PROT 7.0 11/30/2019 0909   ALBUMIN 3.0 (L) 05/02/2020 0345   ALBUMIN 3.8 11/30/2019 0909    AST 175 (H) 05/02/2020 0345   ALT 87 (H) 05/02/2020 0345   ALKPHOS 72 05/02/2020 0345   BILITOT 0.9 05/02/2020 0345   BILITOT 0.8 11/30/2019 0909   GFRNONAA >60 05/02/2020 0345   GFRNONAA 79 03/11/2020 0000   GFRAA 91 03/11/2020 0000   Lipase     Component Value Date/Time   LIPASE 65 (H) 04/27/2019 0600       Studies/Results: No results found.  Anti-infectives: Anti-infectives (From admission, onward)   Start     Dose/Rate Route Frequency Ordered Stop   05/01/20 1000  Dolutegravir-lamiVUDine 50-300 MG TABS 1 tablet  Status:  Discontinued        1 tablet Oral Daily 04/30/20 1629 04/30/20 2103   05/01/20 1000  dolutegravir (TIVICAY) tablet 50 mg       "And" Linked Group Details   50 mg Oral Daily 04/30/20 2103     05/01/20 1000  lamiVUDine (EPIVIR) tablet 300 mg       "And" Linked Group Details   300 mg Oral Daily 04/30/20 2103     04/30/20 1131  ceFAZolin (ANCEF) 2-4 GM/100ML-% IVPB       Note to Pharmacy: Gustavo Lah   : cabinet override      04/30/20 1131 04/30/20 1333   04/30/20 1130  ceFAZolin (ANCEF) IVPB 2g/100 mL premix        2 g 200 mL/hr over  30 Minutes Intravenous On call to O.R. 04/30/20 1126 04/30/20 1323       Assessment/Plan 50 yo female with HCV and new LiRADS-5 lesion on MRI, POD2 s/p open wedge resection of right liver. - Multimodal pain control: prn oxycodone, dilaudid, valium for muscle spasms. Add on scheduled toradol today. - SLIV - Bowel regimen: colace, daily miralax - Continue regular diet - Ambulate, pulmonary toilet - VTE: lovenox, SCDs - Dispo: med-surg floor   LOS: 2 days    Michaelle Birks, MD [facility] Surgery General, Hepatobiliary and Pancreatic Surgery 05/02/20 7:17 AM

## 2020-05-03 NOTE — Plan of Care (Signed)
  Problem: Education: Goal: Knowledge of General Education information will improve Description: Including pain rating scale, medication(s)/side effects and non-pharmacologic comfort measures Outcome: Progressing   Problem: Health Behavior/Discharge Planning: Goal: Ability to manage health-related needs will improve Outcome: Progressing   Problem: Clinical Measurements: Goal: Ability to maintain clinical measurements within normal limits will improve Outcome: Progressing Goal: Will remain free from infection Outcome: Progressing Goal: Respiratory complications will improve Outcome: Progressing   Problem: Activity: Goal: Risk for activity intolerance will decrease Outcome: Progressing   Problem: Nutrition: Goal: Adequate nutrition will be maintained Outcome: Progressing   Problem: Pain Managment: Goal: General experience of comfort will improve Outcome: Progressing   Problem: Safety: Goal: Ability to remain free from injury will improve Outcome: Progressing   Problem: Skin Integrity: Goal: Risk for impaired skin integrity will decrease Outcome: Progressing   

## 2020-05-03 NOTE — Progress Notes (Signed)
Complain of being sweaty but feels cold. Temp of 97.6 orally. Encouraged ambulation but said very sore today. Encouraged use of incentive spirometer every hour while awake.

## 2020-05-03 NOTE — Progress Notes (Signed)
3 Days Post-Op  Subjective:  Patient still having some difficulty with pain control, especially when taking deep breaths. She has ambulated some. Tolerating PO, no nausea/vomiting. No bowel movement yet.   Objective: Vital signs in last 24 hours: Temp:  [98.2 F (36.8 C)-98.3 F (36.8 C)] 98.2 F (36.8 C) (01/08 0435) Pulse Rate:  [67-72] 67 (01/08 0435) Resp:  [18] 18 (01/07 1900) BP: (151-159)/(75-91) 151/91 (01/08 0435) SpO2:  [100 %] 100 % (01/08 0435) Last BM Date: 04/29/20  Intake/Output from previous day: 01/07 0701 - 01/08 0700 In: 360 [P.O.:360] Out: -  Intake/Output this shift: No intake/output data recorded.  PE: General: resting comfortably, NAD Neuro: alert and oriented, no focal deficits Resp: normal work of breathing on room air Abdomen: soft, nondistended, appropriately tender to palpation. Subcostal incision c/d/i, very mild ecchymosis at mid-portion consistent with a small subcutaneous hematoma. Extremities: warm and well-perfused   Lab Results:  Recent Labs    05/01/20 0016 05/02/20 0345  WBC 12.9* 14.0*  HGB 11.0* 9.3*  HCT 34.1* 28.7*  PLT 131* 120*   BMET Recent Labs    05/01/20 0016 05/02/20 0345  NA 131* 139  K 4.3 3.8  CL 97* 100  CO2 24 29  GLUCOSE 173* 132*  BUN 7 8  CREATININE 0.92 0.78  CALCIUM 7.9* 8.4*   PT/INR Recent Labs    04/30/20 1126  LABPROT 13.9  INR 1.1   CMP     Component Value Date/Time   NA 139 05/02/2020 0345   NA 140 11/30/2019 0909   K 3.8 05/02/2020 0345   CL 100 05/02/2020 0345   CO2 29 05/02/2020 0345   GLUCOSE 132 (H) 05/02/2020 0345   BUN 8 05/02/2020 0345   BUN 8 11/30/2019 0909   CREATININE 0.78 05/02/2020 0345   CREATININE 0.88 03/11/2020 0000   CALCIUM 8.4 (L) 05/02/2020 0345   PROT 5.7 (L) 05/02/2020 0345   PROT 7.0 11/30/2019 0909   ALBUMIN 3.0 (L) 05/02/2020 0345   ALBUMIN 3.8 11/30/2019 0909   AST 175 (H) 05/02/2020 0345   ALT 87 (H) 05/02/2020 0345   ALKPHOS 72  05/02/2020 0345   BILITOT 0.9 05/02/2020 0345   BILITOT 0.8 11/30/2019 0909   GFRNONAA >60 05/02/2020 0345   GFRNONAA 79 03/11/2020 0000   GFRAA 91 03/11/2020 0000   Lipase     Component Value Date/Time   LIPASE 65 (H) 04/27/2019 0600       Studies/Results: No results found.  Anti-infectives: Anti-infectives (From admission, onward)   Start     Dose/Rate Route Frequency Ordered Stop   05/01/20 1000  Dolutegravir-lamiVUDine 50-300 MG TABS 1 tablet  Status:  Discontinued        1 tablet Oral Daily 04/30/20 1629 04/30/20 2103   05/01/20 1000  dolutegravir (TIVICAY) tablet 50 mg       "And" Linked Group Details   50 mg Oral Daily 04/30/20 2103     05/01/20 1000  lamiVUDine (EPIVIR) tablet 300 mg       "And" Linked Group Details   300 mg Oral Daily 04/30/20 2103     04/30/20 1131  ceFAZolin (ANCEF) 2-4 GM/100ML-% IVPB       Note to Pharmacy: Gustavo Lah   : cabinet override      04/30/20 1131 04/30/20 1333   04/30/20 1130  ceFAZolin (ANCEF) IVPB 2g/100 mL premix        2 g 200 mL/hr over 30 Minutes Intravenous On call to  O.R. 04/30/20 1126 04/30/20 1323       Assessment/Plan 50 yo female with HCV and new LiRADS-5 lesion on MRI, POD3 s/p open wedge resection of right liver. - Multimodal pain control: prn oxycodone, dilaudid, valium for muscle spasms. Cont scheduled toradol today.  Added heating pad - SLIV - Bowel regimen: colace, daily miralax - Continue regular diet - Ambulate, pulmonary toilet - VTE: lovenox, SCDs - Dispo: med-surg floor   LOS: 3 days    Rosario Adie, MD  Colorectal and General Surgery [facility]  05/03/20 8:57 AM

## 2020-05-04 LAB — TYPE AND SCREEN
ABO/RH(D): O POS
Antibody Screen: NEGATIVE
Unit division: 0
Unit division: 0
Unit division: 0
Unit division: 0

## 2020-05-04 LAB — BPAM RBC
Blood Product Expiration Date: 202202092359
Blood Product Expiration Date: 202202092359
Blood Product Expiration Date: 202202092359
Blood Product Expiration Date: 202202092359
Unit Type and Rh: 5100
Unit Type and Rh: 5100
Unit Type and Rh: 5100
Unit Type and Rh: 5100

## 2020-05-04 MED ORDER — BISACODYL 10 MG RE SUPP
10.0000 mg | Freq: Once | RECTAL | Status: AC
  Administered 2020-05-04: 10 mg via RECTAL
  Filled 2020-05-04: qty 1

## 2020-05-04 NOTE — Plan of Care (Signed)

## 2020-05-04 NOTE — Progress Notes (Signed)
4 Days Post-Op  Subjective:  Had an episode of nausea and emesis yesterday, which worsened her incisional pain.  She has ambulated some.  No bowel movement yet.   Objective: Vital signs in last 24 hours: Temp:  [98.1 F (36.7 C)-98.4 F (36.9 C)] 98.4 F (36.9 C) (01/09 0600) Pulse Rate:  [74-77] 74 (01/09 0600) Resp:  [16-22] 16 (01/09 0600) BP: (121-133)/(75-95) 131/77 (01/09 0600) SpO2:  [100 %] 100 % (01/09 0600) Last BM Date: 04/29/20  Intake/Output from previous day: 01/08 0701 - 01/09 0700 In: 120 [P.O.:120] Out: -  Intake/Output this shift: No intake/output data recorded.  PE: General: resting comfortably, NAD Neuro: alert and oriented, no focal deficits Resp: normal work of breathing on room air Abdomen: soft, nondistended, appropriately tender to palpation. Subcostal incision c/d/i, very mild ecchymosis at mid-portion consistent with a small subcutaneous hematoma. Extremities: warm and well-perfused   Lab Results:  Recent Labs    05/02/20 0345  WBC 14.0*  HGB 9.3*  HCT 28.7*  PLT 120*   BMET Recent Labs    05/02/20 0345  NA 139  K 3.8  CL 100  CO2 29  GLUCOSE 132*  BUN 8  CREATININE 0.78  CALCIUM 8.4*   PT/INR No results for input(s): LABPROT, INR in the last 72 hours. CMP     Component Value Date/Time   NA 139 05/02/2020 0345   NA 140 11/30/2019 0909   K 3.8 05/02/2020 0345   CL 100 05/02/2020 0345   CO2 29 05/02/2020 0345   GLUCOSE 132 (H) 05/02/2020 0345   BUN 8 05/02/2020 0345   BUN 8 11/30/2019 0909   CREATININE 0.78 05/02/2020 0345   CREATININE 0.88 03/11/2020 0000   CALCIUM 8.4 (L) 05/02/2020 0345   PROT 5.7 (L) 05/02/2020 0345   PROT 7.0 11/30/2019 0909   ALBUMIN 3.0 (L) 05/02/2020 0345   ALBUMIN 3.8 11/30/2019 0909   AST 175 (H) 05/02/2020 0345   ALT 87 (H) 05/02/2020 0345   ALKPHOS 72 05/02/2020 0345   BILITOT 0.9 05/02/2020 0345   BILITOT 0.8 11/30/2019 0909   GFRNONAA >60 05/02/2020 0345   GFRNONAA 79  03/11/2020 0000   GFRAA 91 03/11/2020 0000   Lipase     Component Value Date/Time   LIPASE 65 (H) 04/27/2019 0600       Studies/Results: No results found.  Anti-infectives: Anti-infectives (From admission, onward)   Start     Dose/Rate Route Frequency Ordered Stop   05/01/20 1000  Dolutegravir-lamiVUDine 50-300 MG TABS 1 tablet  Status:  Discontinued        1 tablet Oral Daily 04/30/20 1629 04/30/20 2103   05/01/20 1000  dolutegravir (TIVICAY) tablet 50 mg       "And" Linked Group Details   50 mg Oral Daily 04/30/20 2103     05/01/20 1000  lamiVUDine (EPIVIR) tablet 300 mg       "And" Linked Group Details   300 mg Oral Daily 04/30/20 2103     04/30/20 1131  ceFAZolin (ANCEF) 2-4 GM/100ML-% IVPB       Note to Pharmacy: Gustavo Lah   : cabinet override      04/30/20 1131 04/30/20 1333   04/30/20 1130  ceFAZolin (ANCEF) IVPB 2g/100 mL premix        2 g 200 mL/hr over 30 Minutes Intravenous On call to O.R. 04/30/20 1126 04/30/20 1323       Assessment/Plan 50 yo female with HCV and new LiRADS-5 lesion on  MRI, POD3 s/p open wedge resection of right liver. - Multimodal pain control: prn oxycodone, dilaudid, valium for muscle spasms. Cont scheduled toradol today.  Added heating pad but pt has not tried this yet - SLIV - Bowel regimen: colace, daily miralax, will add dulcolax suppository today - Continue diet as tolerated - Ambulate, pulmonary toilet - VTE: lovenox, SCDs - Dispo: home when stable   LOS: 4 days    Rosario Adie, MD  Colorectal and General Surgery [facility]  05/04/20 8:29 AM

## 2020-05-05 LAB — SURGICAL PATHOLOGY

## 2020-05-05 MED ORDER — GABAPENTIN 600 MG PO TABS
300.0000 mg | ORAL_TABLET | Freq: Three times a day (TID) | ORAL | Status: DC
  Administered 2020-05-05 – 2020-05-06 (×6): 300 mg via ORAL
  Filled 2020-05-05 (×6): qty 1

## 2020-05-05 MED ORDER — BISACODYL 10 MG RE SUPP
10.0000 mg | Freq: Once | RECTAL | Status: AC
  Administered 2020-05-05: 10 mg via RECTAL
  Filled 2020-05-05: qty 1

## 2020-05-05 NOTE — Progress Notes (Signed)
    5 Days Post-Op  Subjective: Had a small BM last night. Afebrile, vitals stable. Still having difficulty with pain control. Ambulating. Anxious about going home.   Objective: Vital signs in last 24 hours: Temp:  [97.6 F (36.4 C)-98 F (36.7 C)] 98 F (36.7 C) (01/10 0431) Pulse Rate:  [73-90] 73 (01/10 0431) Resp:  [18] 18 (01/10 0431) BP: (109-134)/(75-87) 134/87 (01/10 0431) SpO2:  [97 %-100 %] 97 % (01/10 0719) Last BM Date: 05/04/20  Intake/Output from previous day: 01/09 0701 - 01/10 0700 In: 320 [P.O.:320] Out: -  Intake/Output this shift: No intake/output data recorded.  PE: General: resting comfortably, NAD HEENT: no scleral icterus Neuro: alert and oriented, no focal deficits Resp: normal work of breathing on room air Abdomen: soft, nondistended, mildly tender at incision. RUQ incision clean, dry and in tact with mild surrounding ecchymosis. Extremities: warm and well-perfused   Lab Results:  No results for input(s): WBC, HGB, HCT, PLT in the last 72 hours. BMET No results for input(s): NA, K, CL, CO2, GLUCOSE, BUN, CREATININE, CALCIUM in the last 72 hours. PT/INR No results for input(s): LABPROT, INR in the last 72 hours. CMP     Component Value Date/Time   NA 139 05/02/2020 0345   NA 140 11/30/2019 0909   K 3.8 05/02/2020 0345   CL 100 05/02/2020 0345   CO2 29 05/02/2020 0345   GLUCOSE 132 (H) 05/02/2020 0345   BUN 8 05/02/2020 0345   BUN 8 11/30/2019 0909   CREATININE 0.78 05/02/2020 0345   CREATININE 0.88 03/11/2020 0000   CALCIUM 8.4 (L) 05/02/2020 0345   PROT 5.7 (L) 05/02/2020 0345   PROT 7.0 11/30/2019 0909   ALBUMIN 3.0 (L) 05/02/2020 0345   ALBUMIN 3.8 11/30/2019 0909   AST 175 (H) 05/02/2020 0345   ALT 87 (H) 05/02/2020 0345   ALKPHOS 72 05/02/2020 0345   BILITOT 0.9 05/02/2020 0345   BILITOT 0.8 11/30/2019 0909   GFRNONAA >60 05/02/2020 0345   GFRNONAA 79 03/11/2020 0000   GFRAA 91 03/11/2020 0000   Lipase     Component  Value Date/Time   LIPASE 65 (H) 04/27/2019 0600        Assessment/Plan 50 yo female with chronic HCV and liver lesion suspicious for HCC, now POD5 s/p open wedge resection. 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 No other lesions were identified intraop. Pathology results were discussed with the patient. - Continue regular diet - Bowel regimen, repeat suppository today - Multimodal pain control, add on gabapentin today - VTE: lovenox, SCDs - Dispo: inpatient    LOS: 5 days    Michaelle Birks, MD [facility] Surgery General, Hepatobiliary and Pancreatic Surgery 05/05/20 7:54 AM

## 2020-05-05 NOTE — Plan of Care (Signed)
  Problem: Education: Goal: Knowledge of General Education information will improve Description: Including pain rating scale, medication(s)/side effects and non-pharmacologic comfort measures Outcome: Progressing   Problem: Health Behavior/Discharge Planning: Goal: Ability to manage health-related needs will improve Outcome: Progressing   Problem: Clinical Measurements: Goal: Ability to maintain clinical measurements within normal limits will improve Outcome: Progressing Goal: Will remain free from infection Outcome: Progressing Goal: Diagnostic test results will improve Outcome: Progressing Goal: Respiratory complications will improve Outcome: Progressing Goal: Cardiovascular complication will be avoided Outcome: Progressing   Problem: Activity: Goal: Risk for activity intolerance will decrease Outcome: Progressing   Problem: Nutrition: Goal: Adequate nutrition will be maintained Outcome: Progressing   Problem: Coping: Goal: Level of anxiety will decrease Outcome: Progressing   Problem: Elimination: Goal: Will not experience complications related to bowel motility Outcome: Progressing Goal: Will not experience complications related to urinary retention Outcome: Progressing   Problem: Pain Managment: Goal: General experience of comfort will improve Outcome: Progressing   Problem: Safety: Goal: Ability to remain free from injury will improve Outcome: Progressing   Problem: Skin Integrity: Goal: Risk for impaired skin integrity will decrease Outcome: Progressing   Problem: Education: Goal: Knowledge of General Education information will improve Description: Including pain rating scale, medication(s)/side effects and non-pharmacologic comfort measures Outcome: Progressing   Problem: Health Behavior/Discharge Planning: Goal: Ability to manage health-related needs will improve Outcome: Progressing   Problem: Clinical Measurements: Goal: Ability to maintain  clinical measurements within normal limits will improve Outcome: Progressing Goal: Will remain free from infection Outcome: Progressing Goal: Diagnostic test results will improve Outcome: Progressing Goal: Respiratory complications will improve Outcome: Progressing Goal: Cardiovascular complication will be avoided Outcome: Progressing   Problem: Coping: Goal: Level of anxiety will decrease Outcome: Progressing   Problem: Elimination: Goal: Will not experience complications related to bowel motility Outcome: Progressing Goal: Will not experience complications related to urinary retention Outcome: Progressing   Problem: Pain Managment: Goal: General experience of comfort will improve Outcome: Progressing   Problem: Safety: Goal: Ability to remain free from injury will improve Outcome: Progressing   Problem: Skin Integrity: Goal: Risk for impaired skin integrity will decrease Outcome: Progressing   Problem: Education: Goal: Required Educational Video(s) Outcome: Progressing   Problem: Clinical Measurements: Goal: Postoperative complications will be avoided or minimized Outcome: Progressing   Problem: Skin Integrity: Goal: Demonstration of wound healing without infection will improve Outcome: Progressing

## 2020-05-05 NOTE — Plan of Care (Signed)
  Problem: Clinical Measurements: Goal: Postoperative complications will be avoided or minimized Outcome: Progressing   Problem: Skin Integrity: Goal: Demonstration of wound healing without infection will improve Outcome: Progressing   

## 2020-05-06 MED ORDER — MAGNESIUM HYDROXIDE 400 MG/5ML PO SUSP
15.0000 mL | Freq: Once | ORAL | Status: AC
  Administered 2020-05-06: 15 mL via ORAL
  Filled 2020-05-06: qty 30

## 2020-05-06 NOTE — Progress Notes (Signed)
Pt states she has had 2 BMs today and would like to go home.  Contacted Dr. Zenia Resides and she will come up and see the pt.

## 2020-05-07 ENCOUNTER — Encounter (HOSPITAL_COMMUNITY): Payer: Self-pay

## 2020-05-07 DIAGNOSIS — D1803 Hemangioma of intra-abdominal structures: Secondary | ICD-10-CM

## 2020-05-07 MED ORDER — GABAPENTIN 600 MG PO TABS: 300.0000 mg | ORAL_TABLET | Freq: Three times a day (TID) | ORAL | 0 refills | Status: DC

## 2020-05-07 NOTE — Progress Notes (Signed)
Teresa [name] to be D/C'd per MD order. Discussed with the patient and all questions fully answered. ? VSS, Skin clean, dry and intact without evidence of skin break down, no evidence of skin tears noted. ? IV catheter discontinued intact. Site without signs and symptoms of complications. Dressing and pressure applied. ? An After Visit Summary was printed and given to the patient. Patient informed where to pickup prescriptions. ? D/C education completed with patient/family including follow up instructions, medication list, d/c activities limitations if indicated, with other d/c instructions as indicated by MD - patient able to verbalize understanding, all questions fully answered.  ? Patient instructed to return to ED, call 911, or call MD for any changes in condition.  ? Patient to be escorted via [place], and D/C home via private auto.

## 2020-05-07 NOTE — Discharge Summary (Signed)
Physician Discharge Summary  Patient ID: Teresa [name] MRN: [id number] DOB/AGE: 1970-10-05 50 y.o.  Admit date: 04/30/2020 Discharge date: 05/07/2020  Admission Diagnoses: Hepatocellular carcinoma  Discharge Diagnoses:  Active Problems:   Liver hemangioma   Discharged Condition: stable  Hospital Course: The patient was taken to the operating room on 05/01/20 for an open wedge resection of a segment 7 liver lesion, which was thought to be [place] based on preoperative imaging and elevated AFP. For further details of the procedure please see separately dictated operative note. Postoperatively the patient was admitted to the progressive care unit in stable condition. Her foley was removed on POD1 and diet was advanced. She was transferred to a med-surg floor. LFTs downtrended appropriately over the next few days. She had some nausea and difficulty with pain control, which was managed with multimodal pain medications. She had mild abdominal distension, which improved after she had return of bowel function. Final surgical pathology showed a hepatic hemangioma but no malignant lesion. These results were discussed with the patient. On POD7 she was tolerating a diet, having bowel function, ambulating, and her pain was controlled on oral medications. She was examined and deemed appropriate for discharge home. She will follow up in clinic in 2 weeks.  Consults: None  Significant Diagnostic Studies: N/A  Treatments: surgery: diagnostic laparoscopy, intraoperative ultrasound, open hepatic wedge resection of segment 7  Discharge Exam: Blood pressure (!) 141/97, pulse 69, temperature 98.4 F (36.9 C), temperature source Oral, resp. rate 15, height 5\' 7"  (1.702 m), weight 79.4 kg, last menstrual period 07/10/2018, SpO2 98 %. General appearance: alert, cooperative and appears stated age Eyes: no scleral icterus Neck: trachea midline Resp: normal work of breathing on room air GI: soft, nontender,  nondistended Extremities: extremities normal, atraumatic, no cyanosis or edema Incision/Wound: right subcostal incision is c/d/i with no erythema or induration  Disposition: Discharge disposition: 01-Home or Self Care       Discharge Instructions    Call MD for:  persistant nausea and vomiting   Complete by: As directed    Call MD for:  redness, tenderness, or signs of infection (pain, swelling, redness, odor or green/yellow discharge around incision site)   Complete by: As directed    Call MD for:  severe uncontrolled pain   Complete by: As directed    Call MD for:  temperature >100.4   Complete by: As directed    Diet general   Complete by: As directed    Increase activity slowly   Complete by: As directed    Lifting restrictions   Complete by: As directed    10 pounds     Allergies as of 05/07/2020      Reactions   Naproxen Sodium Hives   Can take ibuprofen without causing any hives      Medication List    TAKE these medications   albuterol 108 (90 Base) MCG/ACT inhaler Commonly known as: ProAir HFA Inhale 2 puffs into the lungs every 6 (six) hours as needed for wheezing or shortness of breath.   buPROPion 150 MG 24 hr tablet Commonly known as: WELLBUTRIN XL Take 150 mg by mouth daily.   cetirizine 10 MG tablet Commonly known as: ZYRTEC Take 1 tablet (10 mg total) by mouth daily as needed for allergies. What changed: when to take this   diazepam 2 MG tablet Commonly known as: VALIUM Take 1 tablet (2 mg total) by mouth every 6 (six) hours as needed for muscle spasms.   diclofenac  Sodium 1 % Gel Commonly known as: VOLTAREN Apply 2 g topically 4 (four) times daily as needed (pain).   docusate sodium 100 MG capsule Commonly known as: COLACE Take 1 capsule (100 mg total) by mouth 2 (two) times daily.   Dovato 50-300 MG Tabs Generic drug: Dolutegravir-lamiVUDine Take 1 tablet by mouth daily.   Emgality 120 MG/ML Sosy Generic drug:  Galcanezumab-gnlm Inject 120 mg into the skin every 28 (twenty-eight) days.   fluticasone furoate-vilanterol 100-25 MCG/INH Aepb Commonly known as: BREO ELLIPTA Inhale 1 puff into the lungs daily. Rinse mouth after each use. What changed: how much to take   gabapentin 600 MG tablet Commonly known as: NEURONTIN Take 0.5 tablets (300 mg total) by mouth 3 (three) times daily for 7 days.   hydrochlorothiazide 25 MG tablet Commonly known as: HYDRODIURIL Take 1 tablet (25 mg total) by mouth daily.   hydrOXYzine 25 MG tablet Commonly known as: ATARAX/VISTARIL Take 2 tablets (50 mg total) by mouth every 8 (eight) hours as needed.   Mavyret 100-40 MG Tabs Generic drug: Glecaprevir-Pibrentasvir Take three tablets daily for 8 weeks   multivitamin with minerals tablet Take 1 tablet by mouth daily.   ondansetron 4 MG disintegrating tablet Commonly known as: ZOFRAN-ODT Take 1 tablet (4 mg total) by mouth every 8 (eight) hours as needed for nausea or vomiting.   oxyCODONE 5 MG immediate release tablet Commonly known as: Oxy IR/ROXICODONE Take 1-2 tablets (5-10 mg total) by mouth every 3 (three) hours as needed (5mg  for moderate pain, 10mg  for severe pain).   pantoprazole 40 MG tablet Commonly known as: PROTONIX Take 1 tablet (40 mg total) by mouth daily.   polyethylene glycol 17 g packet Commonly known as: MIRALAX / GLYCOLAX Take 17 g by mouth daily.   sertraline 100 MG tablet Commonly known as: ZOLOFT Take 1.5 tablets (150 mg total) by mouth at bedtime.   SUMAtriptan 50 MG tablet Commonly known as: IMITREX Take 1 tablet (50 mg total) by mouth 2 (two) times daily as needed for migraine or headache.   traZODone 50 MG tablet Commonly known as: DESYREL Take 100 mg by mouth at bedtime.   triamcinolone ointment 0.5 % Commonly known as: KENALOG APPLY TOPICALLY TO THE AFFECTED AREA TWICE DAILY What changed: See the new instructions.   venlafaxine 37.5 MG tablet Commonly known  as: EFFEXOR Take 37.5 mg by mouth 2 (two) times daily.        Signed: [name] 05/07/2020, 12:13 PM

## 2020-05-13 ENCOUNTER — Institutional Professional Consult (permissible substitution): Payer: Medicaid Other | Admitting: Neurology

## 2020-05-14 ENCOUNTER — Other Ambulatory Visit: Payer: Self-pay

## 2020-05-14 NOTE — Progress Notes (Signed)
The proposed treatment discussed in conference is for discussion purposes only and is not a binding recommendation.  The patients have not been physically examined, or presented with their treatment options.  Therefore, final treatment plans cannot be decided.   

## 2020-05-20 ENCOUNTER — Other Ambulatory Visit (HOSPITAL_COMMUNITY): Payer: Medicaid Other

## 2020-05-26 NOTE — Progress Notes (Signed)
NEUROLOGY CONSULTATION NOTE  LOREY BENESCH MRN: [id number] DOB: 08-03-1970  Referring provider: Kathe Becton, FNP Primary care provider: Kathe Becton, FNP  Reason for consult:  migraines   Subjective:  Teresa [name] is a 50 year old right-handed female with HIV, HTN, Bipolar disorder, Hepatitis C and history of liver cancer who presents for migraines.  History supplemented by referring provider's note.  Onset:  2002.  Increased in 2018-2019. Location:Usually across the forehead and temple, especially left side, also back of neck Quality:  pulsating Intensity:  severe.   Aura:  Black moving spots in her vision preceding headache Associated symptoms:  Nausea, photophobia, phonophobia, feels off-balance.  She denies associated unilateral numbness or weakness. Duration:  All day Frequency:  At least 3 to 4 times a week Frequency of abortive medication: 3 to 4 days a week Triggers:  anxiety Relieving factors:  Laying down and rest Activity:  Aggravates headache  CT head on 01/29/2020 personally reviewed was normal.    Current NSAIDS/analgesics:  none Current triptans:  Sumatriptan 50mg  Current ergotamine:  none Current anti-emetic:  Zofran-ODT 4mg  Current muscle relaxants:  diazepam Current Antihypertensive medications:  Verapamil CR 240mg , HCTZ Current Antidepressant medications:  Sertraline 150mg  daily, venlafaxine 37.5mg  BID, Wellbutrin 150mg , trazodone Current Anticonvulsant medications:  Gabapentin 300mg  TID Current anti-CGRP:  Emgality (taking at least a year) Current Vitamins/Herbal/Supplements:  none Current Antihistamines/Decongestants:  Zyrtec Other therapy:  none Hormone/birth control:  none Other medications:  Dovato, Mavyret  Past NSAIDS/analgesics:  Fioricet Past abortive triptans:  none Past abortive ergotamine:  none Past muscle relaxants:  none Past anti-emetic:  none Past antihypertensive medications:  Amlodipine, lisinopril Past antidepressant  medications:  nortriptyline Past anticonvulsant medications:  topiramate Past anti-CGRP:  none Past vitamins/Herbal/Supplements:  none Past antihistamines/decongestants:  Allegra Other past therapies:  none  Caffeine:  2 cups of coffee daily Diet:  Ginger ale.  Drinks 3 to 4 bottles water daily Exercise: sometimes Depression:  yes; Anxiety:  yes Other pain:  no Sleep:  Poor.  Takes trazodone 2 hours to work. Still wakes up in middle of night. Family history of headache:  Cousin (migraines)  CBC and CMP from last month reviewed.   PAST MEDICAL HISTORY: Past Medical History:  Diagnosis Date  . Anemia    iron infusions x 2  . Anxiety   . Arthritis    Neck, left shoulder  . Asthma    uses inhaler 2-3 x week  . Bipolar disorder ([place])   . Cancer ([place])    Liver  . Depression   . Eczema   . Endometriosis   . Fibroids   . GERD (gastroesophageal reflux disease)   . Headache    Migraines  . Hepatitis 2009   Hep C  . HIV infection ([place])   . Hypertension   . Ovarian cyst 03/24/2020  . Pancreatitis   . Pruritus 03/11/2020  . Rash 09/18/2019  . Seasonal allergies   . SVD (spontaneous vaginal delivery)    x 1    PAST SURGICAL HISTORY: Past Surgical History:  Procedure Laterality Date  . ABDOMINAL HYSTERECTOMY    . CESAREAN SECTION     x2  . DILATION AND CURETTAGE OF UTERUS     x 2 - TAB  . HAND SURGERY Right    2 pins in wrist  . HYSTEROSCOPY N/A 10/11/2016   Procedure: HYSTEROSCOPY WITH HYDROTHERMAL ABLATION;  Surgeon: Donnamae Jude, MD;  Location: [facility];  Service: Gynecology;  Laterality: N/A;  Caryl Pina  the HTA rep will be here.  Confirmed on 09/02/16.   Marland Kitchen LAPAROSCOPIC BILATERAL SALPINGECTOMY Bilateral 07/11/2018   Procedure: LAPAROSCOPIC BILATERAL SALPINGECTOMY;  Surgeon: Ward, Honor Loh, MD;  Location: [facility];  Service: Gynecology;  Laterality: Bilateral;  . LAPAROSCOPIC HYSTERECTOMY N/A 07/11/2018   Procedure: HYSTERECTOMY TOTAL LAPAROSCOPIC;  Surgeon: Ward,  Honor Loh, MD;  Location: [facility];  Service: Gynecology;  Laterality: N/A;  . LAPAROSCOPIC TUBAL LIGATION Bilateral 10/11/2016   Procedure: LAPAROSCOPIC TUBAL LIGATION WITH FILSHIE CLIPS;  Surgeon: Donnamae Jude, MD;  Location: [facility];  Service: Gynecology;  Laterality: Bilateral;  . LAPAROSCOPY N/A 04/30/2020   Procedure: LAPAROSCOPY DIAGNOSTIC;  Surgeon: [name], MD;  Location: [place];  Service: General;  Laterality: N/A;  . MOUTH SURGERY    . OPEN PARTIAL HEPATECTOMY  Right 04/30/2020   Procedure: OPEN PARTIAL RIGHT HEPATECTOMY;  Surgeon: [name], MD;  Location: [place];  Service: General;  Laterality: Right;  . ROBOTIC ASSISTED LAPAROSCOPIC OVARIAN CYSTECTOMY Left 05/04/2019   Procedure: XI ROBOTIC ASSISTED LAPAROSCOPIC OVARIAN CYSTECTOMY;  Surgeon: Ward, Honor Loh, MD;  Location: [facility];  Service: Gynecology;  Laterality: Left;  . TONSILLECTOMY    . UMBILICAL HERNIA REPAIR N/A 07/11/2018   Procedure: LAPAROSCOPIC UMBILICAL HERNIA REPAIR;  Surgeon: Benjamine Sprague, DO;  Location: [facility];  Service: General;  Laterality: N/A;  . WISDOM TOOTH EXTRACTION      MEDICATIONS: Current Outpatient Medications on File Prior to Visit  Medication Sig Dispense Refill  . albuterol (PROAIR HFA) 108 (90 Base) MCG/ACT inhaler Inhale 2 puffs into the lungs every 6 (six) hours as needed for wheezing or shortness of breath. 8 g 11  . buPROPion (WELLBUTRIN XL) 150 MG 24 hr tablet Take 150 mg by mouth daily.    . cetirizine (ZYRTEC) 10 MG tablet Take 1 tablet (10 mg total) by mouth daily as needed for allergies. (Patient taking differently: Take 10 mg by mouth 2 (two) times daily.) 90 tablet 3  . diazepam (VALIUM) 2 MG tablet Take 1 tablet (2 mg total) by mouth every 6 (six) hours as needed for muscle spasms. 15 tablet 0  . diclofenac Sodium (VOLTAREN) 1 % GEL Apply 2 g topically 4 (four) times daily as needed (pain).    Marland Kitchen docusate sodium (COLACE) 100 MG capsule Take 1 capsule (100 mg total) by mouth 2  (two) times daily. 60 capsule 0  . Dolutegravir-lamiVUDine (DOVATO) 50-300 MG TABS Take 1 tablet by mouth daily. 30 tablet 5  . fluticasone furoate-vilanterol (BREO ELLIPTA) 100-25 MCG/INH AEPB Inhale 1 puff into the lungs daily. Rinse mouth after each use. (Patient taking differently: Inhale 2 puffs into the lungs daily. Rinse mouth after each use.) 60 each 5  . gabapentin (NEURONTIN) 600 MG tablet Take 0.5 tablets (300 mg total) by mouth 3 (three) times daily for 7 days. 10 tablet 0  . Galcanezumab-gnlm (EMGALITY) 120 MG/ML SOSY Inject 120 mg into the skin every 28 (twenty-eight) days.     . Glecaprevir-Pibrentasvir (MAVYRET) 100-40 MG TABS Take three tablets daily for 8 weeks 90 tablet 1  . hydrochlorothiazide (HYDRODIURIL) 25 MG tablet Take 1 tablet (25 mg total) by mouth daily. 90 tablet 3  . hydrOXYzine (ATARAX/VISTARIL) 25 MG tablet Take 2 tablets (50 mg total) by mouth every 8 (eight) hours as needed. 90 tablet 3  . Multiple Vitamins-Minerals (MULTIVITAMIN WITH MINERALS) tablet Take 1 tablet by mouth daily.    . ondansetron (ZOFRAN-ODT) 4 MG disintegrating tablet Take 1 tablet (4  mg total) by mouth every 8 (eight) hours as needed for nausea or vomiting. 15 tablet 0  . oxyCODONE (OXY IR/ROXICODONE) 5 MG immediate release tablet Take 1-2 tablets (5-10 mg total) by mouth every 3 (three) hours as needed (5mg  for moderate pain, 10mg  for severe pain). 30 tablet 0  . pantoprazole (PROTONIX) 40 MG tablet Take 1 tablet (40 mg total) by mouth daily. (Patient not taking: Reported on 04/23/2020) 90 tablet 3  . polyethylene glycol (MIRALAX / GLYCOLAX) 17 g packet Take 17 g by mouth daily. 14 each 0  . sertraline (ZOLOFT) 100 MG tablet Take 1.5 tablets (150 mg total) by mouth at bedtime. 90 tablet 3  . SUMAtriptan (IMITREX) 50 MG tablet Take 1 tablet (50 mg total) by mouth 2 (two) times daily as needed for migraine or headache. 10 tablet 5  . traZODone (DESYREL) 50 MG tablet Take 100 mg by mouth at bedtime.     . triamcinolone ointment (KENALOG) 0.5 % APPLY TOPICALLY TO THE AFFECTED AREA TWICE DAILY (Patient taking differently: Apply 1 application topically 2 (two) times daily as needed (irritation).) 30 g 0  . venlafaxine (EFFEXOR) 37.5 MG tablet Take 37.5 mg by mouth 2 (two) times daily.     No current facility-administered medications on file prior to visit.    ALLERGIES: Allergies  Allergen Reactions  . Naproxen Sodium Hives    Can take ibuprofen without causing any hives    FAMILY HISTORY: Family History  Problem Relation Age of Onset  . Breast cancer Paternal Grandmother   . Breast cancer Cousin   . Hypertension Mother   . Lung cancer Father   . Diabetes Father   . Allergic rhinitis Maternal Aunt   . Asthma Maternal Aunt   . Angioedema Neg Hx   . Atopy Neg Hx   . Eczema Neg Hx   . Immunodeficiency Neg Hx   . Urticaria Neg Hx     SOCIAL HISTORY: Social History   Socioeconomic History  . Marital status: Married    Spouse name: Not on file  . Number of children: Not on file  . Years of education: Not on file  . Highest education level: Not on file  Occupational History  . Not on file  Tobacco Use  . Smoking status: Former Smoker    Packs/day: 0.25    Years: 25.00    Pack years: 6.25    Types: Cigarettes  . Smokeless tobacco: Never Used  . Tobacco comment: uses vapor occasionally  Vaping Use  . Vaping Use: Never used  Substance and Sexual Activity  . Alcohol use: Not Currently    Comment: wine occ  . Drug use: Yes    Types: Marijuana    Comment: 2-3 x per month  . Sexual activity: Not Currently  Other Topics Concern  . Not on file  Social History Narrative  . Not on file   Social Determinants of Health   Financial Resource Strain: Not on file  Food Insecurity: Not on file  Transportation Needs: Not on file  Physical Activity: Not on file  Stress: Not on file  Social Connections: Not on file  Intimate Partner Violence: Not on file    Objective:   Blood pressure 107/74, pulse 87, resp. rate 20, height 5' 6.5" (1.689 m), weight 176 lb (79.8 kg), last menstrual period 07/10/2018, SpO2 97 %. General: No acute distress.  Patient appears well-groomed.   Head:  Normocephalic/atraumatic Eyes:  fundi examined but not visualized Neck: supple,  no paraspinal tenderness, full range of motion Back: No paraspinal tenderness Heart: regular rate and rhythm Lungs: Clear to auscultation bilaterally. Vascular: No carotid bruits. Neurological Exam: Mental status: alert and oriented to person, place, and time, recent and remote memory intact, fund of knowledge intact, attention and concentration intact, speech fluent and not dysarthric, language intact. Cranial nerves: CN I: not tested CN II: pupils equal, round and reactive to light, visual fields intact CN III, IV, VI:  full range of motion, no nystagmus, no ptosis CN V: facial sensation intact. CN VII: upper and lower face symmetric CN VIII: hearing intact CN IX, X: gag intact, uvula midline CN XI: sternocleidomastoid and trapezius muscles intact CN XII: tongue midline Bulk & Tone: normal, no fasciculations. Motor:  muscle strength 5/5 throughout Sensation:  Pinprick, temperature and vibratory sensation intact. Deep Tendon Reflexes:  2+ throughout,  toes downgoing.   Finger to nose testing:  Without dysmetria.   Heel to shin:  Without dysmetria.   Gait:  Normal station and stride.  Romberg negative.  Assessment/Plan:   Chronic migraine with aura.  She has at least 15 headache days a month for over 3 consecutive months, failed topiramate, nortriptyline, venlafaxine, verapamil and Emgality.  1.  Migraine prevention:  Botox.  Stop Emgality 2.  Migraine rescue:  Nurtec with Zofran. Samples provided.  If effective, will send prescription.  Stop sumatriptan. 3.  Limit use of pain relievers to no more than 2 days out of week to prevent risk of rebound or medication-overuse headache. 4.  Keep  headache diary 5.  Follow up for Botox    Thank you for allowing me to take part in the care of this patient.  Metta Clines, DO  CC: Kathe Becton, FNP

## 2020-05-27 ENCOUNTER — Encounter: Payer: Self-pay | Admitting: Neurology

## 2020-05-27 ENCOUNTER — Other Ambulatory Visit: Payer: Self-pay

## 2020-05-27 ENCOUNTER — Ambulatory Visit: Payer: Medicaid Other | Admitting: Neurology

## 2020-05-27 VITALS — BP 107/74 | HR 87 | Resp 20 | Ht 66.5 in | Wt 176.0 lb

## 2020-05-27 DIAGNOSIS — G43109 Migraine with aura, not intractable, without status migrainosus: Secondary | ICD-10-CM | POA: Diagnosis not present

## 2020-05-27 NOTE — Patient Instructions (Signed)
  1. Stop Emgality.  Instead, we will start Botox injections 2. Stop sumatriptan.  When you get a migraine, take Nurtec at earliest onset of headache.  Maximum 1 tablet in 24 hours.  If effective, contact me for prescription.  Take with the ondansetron for nausea. 3. Limit use of pain relievers to no more than 2 days out of the week.  These medications include acetaminophen, NSAIDs (ibuprofen/Advil/Motrin, naproxen/Aleve, triptans (Imitrex/sumatriptan), Excedrin, and narcotics.  This will help reduce risk of rebound headaches. 4. Be aware of common food triggers:  - Caffeine:  coffee, black tea, cola, Mt. Dew  - Chocolate  - Dairy:  aged cheeses (brie, blue, cheddar, gouda, [place], provolone, [place], Swiss, etc), chocolate milk, buttermilk, sour cream, limit eggs and yogurt  - Nuts, peanut butter  - Alcohol  - Cereals/grains:  FRESH breads (fresh bagels, sourdough, doughnuts), yeast productions  - Processed/canned/aged/cured meats (pre-packaged deli meats, hotdogs)  - MSG/glutamate:  soy sauce, flavor enhancer, pickled/preserved/marinated foods  - Sweeteners:  aspartame (Equal, Nutrasweet).  Sugar and Splenda are okay  - Vegetables:  legumes (lima beans, lentils, snow peas, fava beans, pinto peans, peas, garbanzo beans), sauerkraut, onions, olives, pickles  - Fruit:  avocados, bananas, citrus fruit (orange, lemon, grapefruit), mango  - Other:  Frozen meals, macaroni and cheese 5. Routine exercise 6. Stay adequately hydrated (aim for 64 oz water daily) 7. Keep headache diary 8. Maintain proper stress management 9. Maintain proper sleep hygiene 10. Do not skip meals 11. Consider supplements:  magnesium citrate 400mg  daily, riboflavin 400mg  daily, coenzyme Q10 100mg  three times daily.

## 2020-05-28 ENCOUNTER — Telehealth: Payer: Self-pay

## 2020-05-28 ENCOUNTER — Ambulatory Visit (INDEPENDENT_AMBULATORY_CARE_PROVIDER_SITE_OTHER): Payer: Medicaid Other | Admitting: Infectious Disease

## 2020-05-28 VITALS — BP 105/72 | HR 89 | Temp 98.1°F | Resp 16 | Ht 66.5 in | Wt 174.0 lb

## 2020-05-28 DIAGNOSIS — L299 Pruritus, unspecified: Secondary | ICD-10-CM

## 2020-05-28 DIAGNOSIS — B182 Chronic viral hepatitis C: Secondary | ICD-10-CM | POA: Diagnosis not present

## 2020-05-28 DIAGNOSIS — R21 Rash and other nonspecific skin eruption: Secondary | ICD-10-CM | POA: Diagnosis not present

## 2020-05-28 DIAGNOSIS — B20 Human immunodeficiency virus [HIV] disease: Secondary | ICD-10-CM | POA: Diagnosis not present

## 2020-05-28 DIAGNOSIS — I10 Essential (primary) hypertension: Secondary | ICD-10-CM

## 2020-05-28 DIAGNOSIS — D1803 Hemangioma of intra-abdominal structures: Secondary | ICD-10-CM | POA: Diagnosis not present

## 2020-05-28 MED FILL — MAVYRET 100-40 MG TABS: 100-40 | 28 days supply | Qty: 84 | Fill #1

## 2020-05-28 NOTE — Telephone Encounter (Signed)
[facility] Patient Advocate Encounter   Received notification from [facility] that prior authorization for Mavyret is required.   PA submitted on 05/28/20 Key [id number] Status is pending    [facility] will continue to follow.   [name], [facility] Patient [facility] Phone: [phone] Fax:  [phone]

## 2020-05-28 NOTE — Progress Notes (Signed)
Subjective:  Chief complaint postoperative abdominal pain from her exploratory laparotomy and resection of her hemangioma  Patient ID: Teresa [name], female    DOB: Jul 22, 1970, 50 y.o.   MRN: [id number]  HPI  Teresa [name] is a 49 year old African-American lady living with HIV who was originally cared for at [facility] where she says she was on Isentress twice daily and Truvada.  She has been in the care of Dr. Adela Glimpse at [facility] and been on Triumeq with perfectly suppressed viral load and healthy CD4 count.  When I saw Rhanda last May 2021 she was already complaining of itchiness and I prescribed some topical corticosteroids for her.  At the time she was on Triumeq and I changed her to [facility] which is really not much of a change in medication is more of a removal of one in the sense that these are the same medications minus the abacavir.  She does have comorbid hepatitis C genotype 1a which has remained untreated and does have continued elevation in her liver function tests.  She had an elevated AFP as well.  She did have elastography done on 11 having difficulty interpreting her score.  She also had an MRI of the liver done which did not show any evidence of hepatic malignancy.  She continued to have trouble with itching and in fact was seen in the ER where she was given hydroxyzine as well as permethrin cream which she took two treatments of without success.  She is also seen PCP for this as well there does not appear to be any other changes in medications other than the changing her from Triumeq to [facility].  As mentioned the itching was preceding that change in any case.  I DID WONDER IF the PRURITIS is due to UNTREATED HCV infection though she does not have elevated bilirubin which would be more typical  Her HIV has been perfectly controlled.  I was worried about her AFP being elevated   Because of this we got an MRI of the liver which showed what appeared to  be evidence of hepatocellular carcinoma.  She was referred to general surgery and seen by Dr. Michaelle Birks and Dr. Nathaneil Canary back [place].  Patient underwent laparoscopic with an open wedge resection from segment 7 of the liver.  Surprisingly the Riggins came back with a partially calcified hemangioma and no evidence of malignancy.  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  She still has her  which she did not start because we told her not to start.  She is however eager to start treatment for hepatitis C.  Interestingly her pruritus has largely resolved after having resection of her hemangioma.        Past Medical History:  Diagnosis Date  . Anemia    iron infusions x 2  . Anxiety   . Arthritis    Neck, left shoulder  . Asthma    uses inhaler 2-3 x week  . Bipolar disorder ([place])   . Cancer ([place])    Liver  . Depression   . Eczema   . Endometriosis   . Fibroids   . GERD (gastroesophageal reflux disease)   . Headache    Migraines  . Hepatitis 2009   Hep C  . HIV infection ([place])   . Hypertension   . Ovarian cyst 03/24/2020  . Pancreatitis   . Pruritus 03/11/2020  . Rash 09/18/2019  . Seasonal allergies   . SVD (spontaneous vaginal delivery)  x 1    Past Surgical History:  Procedure Laterality Date  . ABDOMINAL HYSTERECTOMY    . CESAREAN SECTION     x2  . DILATION AND CURETTAGE OF UTERUS     x 2 - TAB  . HAND SURGERY Right    2 pins in wrist  . HYSTEROSCOPY N/A 10/11/2016   Procedure: HYSTEROSCOPY WITH HYDROTHERMAL ABLATION;  Surgeon: Donnamae Jude, MD;  Location: [facility];  Service: Gynecology;  Laterality: N/A;  Caryl Pina the HTA rep will be here.  Confirmed on 09/02/16.   Marland Kitchen LAPAROSCOPIC BILATERAL SALPINGECTOMY Bilateral 07/11/2018   Procedure: LAPAROSCOPIC BILATERAL SALPINGECTOMY;  Surgeon: Ward, Honor Loh, MD;  Location: [facility];  Service: Gynecology;  Laterality: Bilateral;  . LAPAROSCOPIC HYSTERECTOMY N/A 07/11/2018   Procedure:  HYSTERECTOMY TOTAL LAPAROSCOPIC;  Surgeon: Ward, Honor Loh, MD;  Location: [facility];  Service: Gynecology;  Laterality: N/A;  . LAPAROSCOPIC TUBAL LIGATION Bilateral 10/11/2016   Procedure: LAPAROSCOPIC TUBAL LIGATION WITH FILSHIE CLIPS;  Surgeon: Donnamae Jude, MD;  Location: [facility];  Service: Gynecology;  Laterality: Bilateral;  . LAPAROSCOPY N/A 04/30/2020   Procedure: LAPAROSCOPY DIAGNOSTIC;  Surgeon: [name], MD;  Location: [facility];  Service: General;  Laterality: N/A;  . MOUTH SURGERY    . OPEN PARTIAL HEPATECTOMY  Right 04/30/2020   Procedure: OPEN PARTIAL RIGHT HEPATECTOMY;  Surgeon: [name], MD;  Location: [facility];  Service: General;  Laterality: Right;  . ROBOTIC ASSISTED LAPAROSCOPIC OVARIAN CYSTECTOMY Left 05/04/2019   Procedure: XI ROBOTIC ASSISTED LAPAROSCOPIC OVARIAN CYSTECTOMY;  Surgeon: Ward, Honor Loh, MD;  Location: [facility];  Service: Gynecology;  Laterality: Left;  . TONSILLECTOMY    . UMBILICAL HERNIA REPAIR N/A 07/11/2018   Procedure: LAPAROSCOPIC UMBILICAL HERNIA REPAIR;  Surgeon: Benjamine Sprague, DO;  Location: [facility];  Service: General;  Laterality: N/A;  . WISDOM TOOTH EXTRACTION      Family History  Problem Relation Age of Onset  . Breast cancer Paternal Grandmother   . Breast cancer Cousin   . Hypertension Mother   . Lung cancer Father   . Diabetes Father   . Allergic rhinitis Maternal Aunt   . Asthma Maternal Aunt   . Angioedema Neg Hx   . Atopy Neg Hx   . Eczema Neg Hx   . Immunodeficiency Neg Hx   . Urticaria Neg Hx       Social History   Socioeconomic History  . Marital status: Married    Spouse name: Not on file  . Number of children: Not on file  . Years of education: Not on file  . Highest education level: Not on file  Occupational History  . Not on file  Tobacco Use  . Smoking status: Former Smoker    Packs/day: 0.25    Years: 25.00    Pack years: 6.25    Types: Cigarettes  . Smokeless tobacco: Never Used  . Tobacco comment:  uses vapor occasionally  Vaping Use  . Vaping Use: Never used  Substance and Sexual Activity  . Alcohol use: Not Currently    Comment: wine occ  . Drug use: Yes    Types: Marijuana    Comment: 2-3 x per month  . Sexual activity: Not Currently  Other Topics Concern  . Not on file  Social History Narrative   Right handed   Drinks caffeine   One story home   Social Determinants of Health   Financial Resource Strain: Not on file  Food Insecurity: Not  on file  Transportation Needs: Not on file  Physical Activity: Not on file  Stress: Not on file  Social Connections: Not on file    Allergies  Allergen Reactions  . Naproxen Sodium Hives    Can take ibuprofen without causing any hives     Current Outpatient Medications:  .  albuterol (PROAIR HFA) 108 (90 Base) MCG/ACT inhaler, Inhale 2 puffs into the lungs every 6 (six) hours as needed for wheezing or shortness of breath., Disp: 8 g, Rfl: 11 .  buPROPion (WELLBUTRIN XL) 150 MG 24 hr tablet, Take 150 mg by mouth daily., Disp: , Rfl:  .  diclofenac Sodium (VOLTAREN) 1 % GEL, Apply 2 g topically 4 (four) times daily as needed (pain)., Disp: , Rfl:  .  docusate sodium (COLACE) 100 MG capsule, Take 1 capsule (100 mg total) by mouth 2 (two) times daily., Disp: 60 capsule, Rfl: 0 .  Dolutegravir-lamiVUDine (DOVATO) 50-300 MG TABS, Take 1 tablet by mouth daily., Disp: 30 tablet, Rfl: 5 .  fluticasone furoate-vilanterol (BREO ELLIPTA) 100-25 MCG/INH AEPB, Inhale 1 puff into the lungs daily. Rinse mouth after each use. (Patient taking differently: Inhale 2 puffs into the lungs daily. Rinse mouth after each use.), Disp: 60 each, Rfl: 5 .  gabapentin (NEURONTIN) 600 MG tablet, Take 0.5 tablets (300 mg total) by mouth 3 (three) times daily for 7 days., Disp: 10 tablet, Rfl: 0 .  hydrochlorothiazide (HYDRODIURIL) 25 MG tablet, Take 1 tablet (25 mg total) by mouth daily., Disp: 90 tablet, Rfl: 3 .  hydrOXYzine (ATARAX/VISTARIL) 25 MG tablet, Take  2 tablets (50 mg total) by mouth every 8 (eight) hours as needed., Disp: 90 tablet, Rfl: 3 .  ondansetron (ZOFRAN-ODT) 4 MG disintegrating tablet, Take 1 tablet (4 mg total) by mouth every 8 (eight) hours as needed for nausea or vomiting., Disp: 15 tablet, Rfl: 0 .  sertraline (ZOLOFT) 100 MG tablet, Take 1.5 tablets (150 mg total) by mouth at bedtime., Disp: 90 tablet, Rfl: 3 .  traZODone (DESYREL) 50 MG tablet, Take 100 mg by mouth at bedtime., Disp: , Rfl:  .  triamcinolone ointment (KENALOG) 0.5 %, APPLY TOPICALLY TO THE AFFECTED AREA TWICE DAILY (Patient taking differently: Apply 1 application topically 2 (two) times daily as needed (irritation).), Disp: 30 g, Rfl: 0 .  verapamil (CALAN-SR) 240 MG CR tablet, Take 240 mg by mouth daily., Disp: , Rfl:     Review of Systems  Constitutional: Negative for chills and fever.  HENT: Negative for congestion and sore throat.   Eyes: Negative for photophobia.  Respiratory: Negative for cough, shortness of breath and wheezing.   Cardiovascular: Negative for chest pain, palpitations and leg swelling.  Gastrointestinal: Positive for abdominal pain. Negative for blood in stool, constipation, diarrhea, nausea and vomiting.  Genitourinary: Negative for dysuria, flank pain and hematuria.  Musculoskeletal: Negative for back pain and myalgias.  Skin: Negative for rash.  Neurological: Negative for dizziness and weakness.  Hematological: Does not bruise/bleed easily.  Psychiatric/Behavioral: Negative for agitation and suicidal ideas.       Objective:   Physical Exam Constitutional:      General: She is not in acute distress.    Appearance: She is well-developed. She is not diaphoretic.  HENT:     Head: Normocephalic and atraumatic.     Mouth/Throat:     Pharynx: No oropharyngeal exudate.  Eyes:     General: No scleral icterus.    Conjunctiva/sclera: Conjunctivae normal.  Cardiovascular:  Rate and Rhythm: Normal rate and regular rhythm.   Pulmonary:     Effort: Pulmonary effort is normal. No respiratory distress.     Breath sounds: No wheezing.  Abdominal:     General: There is no distension.  Musculoskeletal:        General: No tenderness. Normal range of motion.     Cervical back: Normal range of motion and neck supple.  Skin:    General: Skin is warm and dry.     Coloration: Skin is not jaundiced or pale.     Findings: No erythema or rash.  Neurological:     General: No focal deficit present.     Mental Status: She is alert and oriented to person, place, and time.     Motor: No abnormal muscle tone.     Coordination: Coordination normal.  Psychiatric:        Attention and Perception: Attention and perception normal.        Mood and Affect: Mood normal.        Speech: Speech normal.        Behavior: Behavior normal.        Thought Content: Thought content normal.        Cognition and Memory: Cognition and memory normal.        Judgment: Judgment normal.        Assessment & Plan:   Hepatitis C chronic without hepatic coma:  Genotype Ia she has Mavyret at home and she will start this.  We are going to need to get reapproval of her second month and explain that she did not start medications because we instructed her to not do so.  I will see her at the end of this month and check a HSV viral load.  We will need to screen for [facility] in the future fortunately she did not have hepatocellular carcinoma on resection of this suspicious lesion.  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  Pruritus seems largely resolved which seems to correlate with her having had resection of her hemangioma.  Headaches being followed by neurology.  Hypertension followed by Dr. Clovis Riley and on verapamil  COVID prevention: He is a third dose of Moderna but if she cannot get one at pharmacy have advised to get [place] one, If she can get Moderna recommended getting number  3

## 2020-05-28 NOTE — Telephone Encounter (Signed)
[facility] Patient Advocate Encounter  Prior Authorization for Mavyret has been approved.    PA# [id number] Effective dates: 05/28/20 through 06/25/20  Patients co-pay is $3.00.   [facility] will continue to follow.  [name], [facility] Patient [name] Phone: [phone] Fax:  [phone]

## 2020-06-05 ENCOUNTER — Other Ambulatory Visit: Payer: Self-pay

## 2020-06-09 ENCOUNTER — Encounter: Payer: Self-pay | Admitting: Allergy

## 2020-06-09 ENCOUNTER — Other Ambulatory Visit: Payer: Self-pay

## 2020-06-09 ENCOUNTER — Ambulatory Visit (INDEPENDENT_AMBULATORY_CARE_PROVIDER_SITE_OTHER): Payer: Medicaid Other | Admitting: Allergy

## 2020-06-09 VITALS — BP 134/84 | HR 80 | Temp 97.6°F | Resp 18 | Ht 66.5 in | Wt 172.0 lb

## 2020-06-09 DIAGNOSIS — R0602 Shortness of breath: Secondary | ICD-10-CM | POA: Diagnosis not present

## 2020-06-09 DIAGNOSIS — J3089 Other allergic rhinitis: Secondary | ICD-10-CM | POA: Diagnosis not present

## 2020-06-09 DIAGNOSIS — J4530 Mild persistent asthma, uncomplicated: Secondary | ICD-10-CM

## 2020-06-09 DIAGNOSIS — J45909 Unspecified asthma, uncomplicated: Secondary | ICD-10-CM | POA: Insufficient documentation

## 2020-06-09 DIAGNOSIS — L299 Pruritus, unspecified: Secondary | ICD-10-CM

## 2020-06-09 MED ORDER — FLOVENT HFA 110 MCG/ACT IN AERO: 2.0000 | INHALATION_SPRAY | Freq: Two times a day (BID) | RESPIRATORY_TRACT | 5 refills | Status: DC

## 2020-06-09 MED ORDER — AZELASTINE HCL 0.1 % NA SOLN: 1.0000 | Freq: Two times a day (BID) | NASAL | 5 refills | Status: AC | PRN

## 2020-06-09 NOTE — Assessment & Plan Note (Signed)
Past history - Whole body pruritus for 5 months. No associated rash but sometimes has bruising. Tried topical steroid cream, hydroxyzine, zyrtec, permethrin with minimal benefit. Past medical history significant for controlled HIV and not treated hepatitis C. Denies changes in medications, diet.  Interim history - alpha-gal, tryptase and ANA normal. Significantly improved since last OV.  Try to take zyrtec 10mg  ONCE a day only.  If you notice worsening itching then you can take it twice a day.   May take hydroxyzine 25mg  1 tablet every 8 hours if needed for itching.  This may make you drowsy.   Continue proper skin care.  Continue to follow up with ID.

## 2020-06-09 NOTE — Patient Instructions (Addendum)
Itching:   Try to take zyrtec 10mg  ONCE a day only.  If you notice worsening itching then you can take it twice a day.   May take hydroxyzine 25mg  1 tablet every 8 hours if needed for itching.  This may make you drowsy.   Continue proper skin care.  Continue to follow up with your ID doctor.  Breathing: . Daily controller medication(s): start Flovent 169mcg 2 puffs twice a day with spacer and rinse mouth afterwards. o Spacer given and demonstrated proper use with inhaler. Patient understood technique and all questions/concerned were addressed.  Marland Kitchen STOP Breo.  . May use albuterol rescue inhaler 2 puffs every 4 to 6 hours as needed for shortness of breath, chest tightness, coughing, and wheezing. May use albuterol rescue inhaler 2 puffs 5 to 15 minutes prior to strenuous physical activities. Monitor frequency of use.  . Asthma control goals:  o Full participation in all desired activities (may need albuterol before activity) o Albuterol use two times or less a week on average (not counting use with activity) o Cough interfering with sleep two times or less a month o Oral steroids no more than once a year o No hospitalizations  Rhinitis:  Environmental allergy panel was borderline positive to grass pollen, cockroaches, tree pollen, ragweed/weed pollen.   See below for environmental control measures.   Continue with zyrtec as above.  May use azelastine nasal spray 1-2 sprays per nostril twice a day as needed for runny nose/drainage.  Nasal saline spray (i.e., Simply Saline) or nasal saline lavage (i.e., NeilMed) is recommended as needed and prior to medicated nasal sprays.  Follow up in 3 months or sooner if needed.   Skin care recommendations  Bath time: . Always use lukewarm water. AVOID very hot or cold water. Marland Kitchen Keep bathing time to 5-10 minutes. . Do NOT use bubble bath. . Use a mild soap and use just enough to wash the dirty areas. . Do NOT scrub skin vigorously.   . After bathing, pat dry your skin with a towel. Do NOT rub or scrub the skin.  Moisturizers and prescriptions:  . ALWAYS apply moisturizers immediately after bathing (within 3 minutes). This helps to lock-in moisture. . Use the moisturizer several times a day over the whole body. Kermit Balo summer moisturizers include: Aveeno, CeraVe, Cetaphil. Kermit Balo winter moisturizers include: Aquaphor, Vaseline, Cerave, Cetaphil, Eucerin, Vanicream. . When using moisturizers along with medications, the moisturizer should be applied about one hour after applying the medication to prevent diluting effect of the medication or moisturize around where you applied the medications. When not using medications, the moisturizer can be continued twice daily as maintenance.  Laundry and clothing: . Avoid laundry products with added color or perfumes. . Use unscented hypo-allergenic laundry products such as Tide free, Cheer free & gentle, and All free and clear.  . If the skin still seems dry or sensitive, you can try double-rinsing the clothes. . Avoid tight or scratchy clothing such as wool. . Do not use fabric softeners or dyer sheets.  Reducing Pollen Exposure . Pollen seasons: trees (spring), grass (summer) and ragweed/weeds (fall). Marland Kitchen Keep windows closed in your home and car to lower pollen exposure.  Susa Simmonds air conditioning in the bedroom and throughout the house if possible.  . Avoid going out in dry windy days - especially early morning. . Pollen counts are highest between 5 - 10 AM and on dry, hot and windy days.  . Save outside activities for  late afternoon or after a heavy rain, when pollen levels are lower.  . Avoid mowing of grass if you have grass pollen allergy. Marland Kitchen Be aware that pollen can also be transported indoors on people and pets.  . Dry your clothes in an automatic dryer rather than hanging them outside where they might collect pollen.  . Rinse hair and eyes before bedtime. Cockroach Allergen  Avoidance Cockroaches are often found in the homes of densely populated urban areas, schools or commercial buildings, but these creatures can lurk almost anywhere. This does not mean that you have a dirty house or living area. . Block all areas where roaches can enter the home. This includes crevices, wall cracks and windows.  . Cockroaches need water to survive, so fix and seal all leaky faucets and pipes. Have an exterminator go through the house when your family and pets are gone to eliminate any remaining roaches. Marland Kitchen Keep food in lidded containers and put pet food dishes away after your pets are done eating. Vacuum and sweep the floor after meals, and take out garbage and recyclables. Use lidded garbage containers in the kitchen. Wash dishes immediately after use and clean under stoves, refrigerators or toasters where crumbs can accumulate. Wipe off the stove and other kitchen surfaces and cupboards regularly.

## 2020-06-09 NOTE — Assessment & Plan Note (Signed)
Past history - Perennial rhino conjunctivitis symptoms which flares during the spring.  Interim history - does not tolerate Flonase, increased PND in the mornings. 2021 bloodwork borderline positive to grass pollen, cockroaches, tree pollen, ragweed/weed pollen.   See below for environmental control measures.   Continue with zyrtec as above.  May use azelastine nasal spray 1-2 sprays per nostril twice a day as needed for runny nose/drainage.  Nasal saline spray (i.e., Simply Saline) or nasal saline lavage (i.e., NeilMed) is recommended as needed and prior to medicated nasal sprays.

## 2020-06-09 NOTE — Assessment & Plan Note (Signed)
Past history - Significant tobacco exposure history and used to be on Spiriva. Now having issues with shortness of breath and wheezing in the mornings and at night for the past 1.5 months. 2021 spirometry showed: normal pattern with no improvement in FEV1 post bronchodilator treatment. Clinically feeling the same. Patient used albuterol 1-2 hours before visit. Interim history - patient having difficulty taking a deep breath after liver biopsy. Not sure if [place] is getting into her lungs or if it's helping.  . Daily controller medication(s): start Flovent 113mcg 2 puffs twice a day with spacer and rinse mouth afterwards. o Spacer given and demonstrated proper use with inhaler. Patient understood technique and all questions/concerned were addressed.  Marland Kitchen STOP Breo.  . May use albuterol rescue inhaler 2 puffs every 4 to 6 hours as needed for shortness of breath, chest tightness, coughing, and wheezing. May use albuterol rescue inhaler 2 puffs 5 to 15 minutes prior to strenuous physical activities. Monitor frequency of use.  . Get spirometry at next visit - if biopsy areas feeling better.

## 2020-06-09 NOTE — Progress Notes (Signed)
Follow Up Note  RE: Teresa [name] MRN: [id number] DOB: 1970/11/09 Date of Office Visit: 06/09/2020  Referring provider: Azzie Glatter, FNP Primary care provider: Azzie Glatter, FNP  Chief Complaint: Pruritus (Has a dry cough and has been nauseated due to the coughing //Has been having difficulty using the Breo inhaler  )  History of Present Illness: I had the pleasure of seeing [name] for a follow up visit at the [facility] on 06/09/2020. She is a 50 y.o. female, who is being followed for pruritus, allergic rhinitis and shortness of breath. Her previous allergy office visit was on 04/02/2020 with Dr. Maudie Mercury. Today is a regular follow up visit.  Pruritus Currently taking zyrtec 10mg  twice a day with good benefit.  Had a flare of itching last week but it improved once she had a BM? She did use some additional hydroxyzine during that time.   Follows with ID for HIV which is under controlled.   Other allergic rhinitis Having some drainage in the mornings.  Did not tolerate Flonase in the past.   Shortness of breath Patient had liver biopsy on 04/30/2020 and is unable to take deep breaths without discomfort at her surgical sites.  [place] Pax and not sure if the medicine is getting into her lungs and she is having some chalky feeling in her mouth even with rinsing out after each use. Using albuterol every other day after exertion with some benefit. Having some dry coughing but not much wheezing.   Assessment and Plan: [name] is a 50 y.o. female with: Pruritus Past history - Whole body pruritus for 5 months. No associated rash but sometimes has bruising. Tried topical steroid cream, hydroxyzine, zyrtec, permethrin with minimal benefit. Past medical history significant for controlled HIV and not treated hepatitis C. Denies changes in medications, diet.  Interim history - alpha-gal, tryptase and ANA normal. Significantly improved since last OV.  Try to take  zyrtec 10mg  ONCE a day only.  If you notice worsening itching then you can take it twice a day.   May take hydroxyzine 25mg  1 tablet every 8 hours if needed for itching.  This may make you drowsy.   Continue proper skin care.  Continue to follow up with ID.  Reactive airway disease Past history - Significant tobacco exposure history and used to be on Spiriva. Now having issues with shortness of breath and wheezing in the mornings and at night for the past 1.5 months. 2021 spirometry showed: normal pattern with no improvement in FEV1 post bronchodilator treatment. Clinically feeling the same. Patient used albuterol 1-2 hours before visit. Interim history - patient having difficulty taking a deep breath after liver biopsy. Not sure if Memory Dance is getting into her lungs or if it's helping.  . Daily controller medication(s): start Flovent 146mcg 2 puffs twice a day with spacer and rinse mouth afterwards. o Spacer given and demonstrated proper use with inhaler. Patient understood technique and all questions/concerned were addressed.  Marland Kitchen STOP Breo.  . May use albuterol rescue inhaler 2 puffs every 4 to 6 hours as needed for shortness of breath, chest tightness, coughing, and wheezing. May use albuterol rescue inhaler 2 puffs 5 to 15 minutes prior to strenuous physical activities. Monitor frequency of use.  . Get spirometry at next visit - if biopsy areas feeling better.   Other allergic rhinitis Past history - Perennial rhino conjunctivitis symptoms which flares during the spring.  Interim history - does not tolerate  Flonase, increased PND in the mornings. 2021 bloodwork borderline positive to grass pollen, cockroaches, tree pollen, ragweed/weed pollen.   See below for environmental control measures.   Continue with zyrtec as above.  May use azelastine nasal spray 1-2 sprays per nostril twice a day as needed for runny nose/drainage.  Nasal saline spray (i.e., Simply Saline) or nasal saline lavage  (i.e., NeilMed) is recommended as needed and prior to medicated nasal sprays.  Return in about 3 months (around 09/06/2020).  Meds ordered this encounter  Medications  . fluticasone (FLOVENT HFA) 110 MCG/ACT inhaler    Sig: Inhale 2 puffs into the lungs in the morning and at bedtime. with spacer and rinse mouth afterwards.    Dispense:  1 each    Refill:  5  . azelastine (ASTELIN) 0.1 % nasal spray    Sig: Place 1-2 sprays into both nostrils 2 (two) times daily as needed (drainage). Use in each nostril as directed    Dispense:  30 mL    Refill:  5   Lab Orders  No laboratory test(s) ordered today    Diagnostics: None.  Medication List:  Current Outpatient Medications  Medication Sig Dispense Refill  . albuterol (PROAIR HFA) 108 (90 Base) MCG/ACT inhaler Inhale 2 puffs into the lungs every 6 (six) hours as needed for wheezing or shortness of breath. 8 g 11  . azelastine (ASTELIN) 0.1 % nasal spray Place 1-2 sprays into both nostrils 2 (two) times daily as needed (drainage). Use in each nostril as directed 30 mL 5  . buPROPion (WELLBUTRIN XL) 150 MG 24 hr tablet Take 150 mg by mouth daily.    . diclofenac Sodium (VOLTAREN) 1 % GEL Apply 2 g topically 4 (four) times daily as needed (pain).    Marland Kitchen docusate sodium (COLACE) 100 MG capsule Take 1 capsule (100 mg total) by mouth 2 (two) times daily. 60 capsule 0  . Dolutegravir-lamiVUDine (DOVATO) 50-300 MG TABS Take 1 tablet by mouth daily. 30 tablet 5  . fluticasone (FLOVENT HFA) 110 MCG/ACT inhaler Inhale 2 puffs into the lungs in the morning and at bedtime. with spacer and rinse mouth afterwards. 1 each 5  . hydrochlorothiazide (HYDRODIURIL) 25 MG tablet Take 1 tablet (25 mg total) by mouth daily. 90 tablet 3  . hydrOXYzine (ATARAX/VISTARIL) 25 MG tablet Take 2 tablets (50 mg total) by mouth every 8 (eight) hours as needed. 90 tablet 3  . ondansetron (ZOFRAN-ODT) 4 MG disintegrating tablet Take 1 tablet (4 mg total) by mouth every 8  (eight) hours as needed for nausea or vomiting. 15 tablet 0  . sertraline (ZOLOFT) 100 MG tablet Take 1.5 tablets (150 mg total) by mouth at bedtime. 90 tablet 3  . traZODone (DESYREL) 50 MG tablet Take 100 mg by mouth at bedtime.    . triamcinolone ointment (KENALOG) 0.5 % APPLY TOPICALLY TO THE AFFECTED AREA TWICE DAILY (Patient taking differently: Apply 1 application topically 2 (two) times daily as needed (irritation).) 30 g 0  . verapamil (CALAN-SR) 240 MG CR tablet Take 240 mg by mouth daily.    Marland Kitchen gabapentin (NEURONTIN) 600 MG tablet Take 0.5 tablets (300 mg total) by mouth 3 (three) times daily for 7 days. 10 tablet 0   No current facility-administered medications for this visit.   Allergies: Allergies  Allergen Reactions  . Naproxen Sodium Hives    Can take ibuprofen without causing any hives   I reviewed her past medical history, social history, family history, and environmental history  and no significant changes have been reported from her previous visit.  Review of Systems  Constitutional: Negative for appetite change, chills, fever and unexpected weight change.  HENT: Positive for congestion and rhinorrhea.   Eyes: Negative for itching.  Respiratory: Positive for shortness of breath and wheezing. Negative for cough and chest tightness.   Cardiovascular: Negative for chest pain.  Gastrointestinal: Negative for abdominal pain.  Genitourinary: Negative for difficulty urinating.  Skin: Negative for rash.  Neurological: Negative for headaches.   Objective: BP 134/84   Pulse 80   Temp 97.6 F (36.4 C)   Resp 18   Ht 5' 6.5" (1.689 m)   Wt 172 lb (78 kg)   LMP 07/10/2018 (Exact Date)   SpO2 99%   BMI 27.35 kg/m  Body mass index is 27.35 kg/m. Physical Exam Vitals and nursing note reviewed.  Constitutional:      Appearance: Normal appearance. She is well-developed.  HENT:     Head: Normocephalic and atraumatic.     Right Ear: External ear normal.     Left Ear:  External ear normal.     Nose: Nose normal.     Mouth/Throat:     Mouth: Mucous membranes are moist.     Pharynx: Oropharynx is clear.  Eyes:     Conjunctiva/sclera: Conjunctivae normal.  Cardiovascular:     Rate and Rhythm: Normal rate and regular rhythm.     Heart sounds: Normal heart sounds. No murmur heard. No friction rub. No gallop.   Pulmonary:     Effort: Pulmonary effort is normal.     Breath sounds: Normal breath sounds. No wheezing, rhonchi or rales.  Musculoskeletal:     Cervical back: Neck supple.  Skin:    General: Skin is warm.     Findings: No rash.  Neurological:     Mental Status: She is alert and oriented to person, place, and time.  Psychiatric:        Behavior: Behavior normal.    Previous notes and tests were reviewed. The plan was reviewed with the patient/family, and all questions/concerned were addressed.  It was my pleasure to see Teresa [name] today and participate in her care. Please feel free to contact me with any questions or concerns.  Sincerely,  Rexene Alberts, DO Allergy & Immunology  [facility] office: [place] office: [phone]

## 2020-06-13 ENCOUNTER — Encounter: Payer: Self-pay | Admitting: Neurology

## 2020-06-13 NOTE — Progress Notes (Signed)
Per BV - Botox is covered through [place] only. Sent to scanning for her chart.

## 2020-06-19 ENCOUNTER — Other Ambulatory Visit: Payer: Self-pay | Admitting: Neurology

## 2020-06-19 MED ORDER — NURTEC 75 MG PO TBDP: 75.0000 mg | ORAL_TABLET | Freq: Every day | ORAL | 5 refills | Status: DC | PRN

## 2020-06-20 ENCOUNTER — Ambulatory Visit (INDEPENDENT_AMBULATORY_CARE_PROVIDER_SITE_OTHER): Payer: Medicaid Other | Admitting: Infectious Disease

## 2020-06-20 ENCOUNTER — Encounter: Payer: Self-pay | Admitting: Infectious Disease

## 2020-06-20 ENCOUNTER — Other Ambulatory Visit: Payer: Self-pay

## 2020-06-20 VITALS — BP 152/101 | HR 71 | Temp 98.2°F | Wt 172.0 lb

## 2020-06-20 DIAGNOSIS — G43909 Migraine, unspecified, not intractable, without status migrainosus: Secondary | ICD-10-CM | POA: Diagnosis not present

## 2020-06-20 DIAGNOSIS — D1803 Hemangioma of intra-abdominal structures: Secondary | ICD-10-CM | POA: Diagnosis not present

## 2020-06-20 DIAGNOSIS — B182 Chronic viral hepatitis C: Secondary | ICD-10-CM

## 2020-06-20 DIAGNOSIS — I10 Essential (primary) hypertension: Secondary | ICD-10-CM | POA: Diagnosis not present

## 2020-06-20 DIAGNOSIS — R1013 Epigastric pain: Secondary | ICD-10-CM | POA: Diagnosis not present

## 2020-06-20 DIAGNOSIS — B20 Human immunodeficiency virus [HIV] disease: Secondary | ICD-10-CM | POA: Diagnosis not present

## 2020-06-20 DIAGNOSIS — R109 Unspecified abdominal pain: Secondary | ICD-10-CM

## 2020-06-20 HISTORY — DX: Unspecified abdominal pain: R10.9

## 2020-06-20 NOTE — Progress Notes (Signed)
Subjective:  Chief complaint headache and also persistent abdominal pain at her surgical site  Patient ID: Teresa [name] [name], female    DOB: 30-Dec-1970, 50 y.o.   MRN: [id number]  HPI  Teresa [name] [name] is a 50 year old African-American lady living with HIV who was originally cared for at [facility] where she says she was on Isentress twice daily and Truvada.  She has been in the care of Dr. Adela Glimpse at [facility] and been on Triumeq with perfectly suppressed viral load and healthy CD4 count.  When I saw Teresa [name] [name] she was already complaining of itchiness and I prescribed some topical corticosteroids for her.  At the time she was on Triumeq and I changed her to [facility] which is really not much of a change in medication is more of a removal of one in the sense that these are the same medications minus the abacavir.  She does have comorbid hepatitis C genotype 1a which has remained untreated and does have continued elevation in her liver function tests.  She had an elevated AFP as well.  She did have elastography done on 11 having difficulty interpreting her score.  She also had an MRI of the liver done which did not show any evidence of hepatic malignancy.  She continued to have trouble with itching and in fact was seen in the ER where she was given hydroxyzine as well as permethrin cream which she took two treatments of without success.  She is also seen PCP for this as well there does not appear to be any other changes in medications other than the changing her from Triumeq to [name].  As mentioned the itching was preceding that change in any case.  I DID WONDER IF the PRURITIS is due to UNTREATED HCV infection though she does not have elevated bilirubin which would be more typical  Her HIV has been perfectly controlled.  I was worried about her AFP being elevated   Because of this we got an MRI of the liver which showed what appeared to be evidence of  hepatocellular carcinoma.  She was referred to general surgery and seen by Dr. Michaelle Birks and Dr. Nathaneil Canary back [place].  Patient underwent laparoscopic with an open wedge resection from segment 7 of the liver.  Surprisingly the [name] came back with a partially calcified hemangioma and no evidence of malignancy.  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  She still had her [name] which she did not start because we told her not to start.  She was  however eager to start treatment for hepatitis C.  Interestingly her pruritus has largely resolved after having resection of her hemangioma.  We had her start her Mavyret at the last clinic visit.  And she has been taking it faithfully since then.  She is about to start her second month's worth of this.  She does have a headache today which is typical for her migraine headaches she still has abdominal pain which she says has became worse after an hour long car ride yesterday.  She her pain seems out of worsen of what 1 would expect and typical postoperative pain a month later.        Past Medical History:  Diagnosis Date  . Abdominal pain 06/20/2020  . Anemia    iron infusions x 2  . Anxiety   . Arthritis    Neck, left shoulder  . Asthma    uses inhaler 2-3 x week  .  Bipolar disorder ([name])   . Cancer ([name])    Liver  . Depression   . Eczema   . Endometriosis   . Fibroids   . GERD (gastroesophageal reflux disease)   . Headache    Migraines  . Hepatitis 2009   Hep C  . HIV infection ([name])   . Hypertension   . Ovarian cyst 11/29/[name]  . Pancreatitis   . Pruritus 11/16/[name]  . Rash 5/25/[name]  . Seasonal allergies   . SVD (spontaneous vaginal delivery)    x 1    Past Surgical History:  Procedure Laterality Date  . ABDOMINAL HYSTERECTOMY    . CESAREAN SECTION     x2  . DILATION AND CURETTAGE OF UTERUS     x 2 - TAB  . HAND SURGERY Right    2 pins in wrist  . HYSTEROSCOPY N/A 10/11/2016   Procedure:  HYSTEROSCOPY WITH HYDROTHERMAL ABLATION;  Surgeon: Donnamae Jude, MD;  Location: [facility];  Service: Gynecology;  Laterality: N/A;  Caryl Pina the [employer] rep will be here.  Confirmed on 09/02/16.   Marland Kitchen LAPAROSCOPIC BILATERAL SALPINGECTOMY Bilateral 07/11/2018   Procedure: LAPAROSCOPIC BILATERAL SALPINGECTOMY;  Surgeon: Ward, Honor Loh, MD;  Location: [facility];  Service: Gynecology;  Laterality: Bilateral;  . LAPAROSCOPIC HYSTERECTOMY N/A 07/11/2018   Procedure: HYSTERECTOMY TOTAL LAPAROSCOPIC;  Surgeon: Ward, Honor Loh, MD;  Location: [facility];  Service: Gynecology;  Laterality: N/A;  . LAPAROSCOPIC TUBAL LIGATION Bilateral 10/11/2016   Procedure: LAPAROSCOPIC TUBAL LIGATION WITH FILSHIE CLIPS;  Surgeon: Donnamae Jude, MD;  Location: [facility];  Service: Gynecology;  Laterality: Bilateral;  . LAPAROSCOPY N/A 04/30/2020   Procedure: LAPAROSCOPY DIAGNOSTIC;  Surgeon: [name], MD;  Location: [facility];  Service: General;  Laterality: N/A;  . MOUTH SURGERY    . OPEN PARTIAL HEPATECTOMY  Right 04/30/2020   Procedure: OPEN PARTIAL RIGHT HEPATECTOMY;  Surgeon: [name], MD;  Location: [facility];  Service: General;  Laterality: Right;  . ROBOTIC ASSISTED LAPAROSCOPIC OVARIAN CYSTECTOMY Left 1/8/[name]   Procedure: XI ROBOTIC ASSISTED LAPAROSCOPIC OVARIAN CYSTECTOMY;  Surgeon: Ward, Honor Loh, MD;  Location: [facility];  Service: Gynecology;  Laterality: Left;  . TONSILLECTOMY    . tumor biopsy   jan 5th 2022  . UMBILICAL HERNIA REPAIR N/A 07/11/2018   Procedure: LAPAROSCOPIC UMBILICAL HERNIA REPAIR;  Surgeon: Benjamine Sprague, DO;  Location: [facility];  Service: General;  Laterality: N/A;  . WISDOM TOOTH EXTRACTION      Family History  Problem Relation Age of Onset  . Breast cancer Paternal Grandmother   . Breast cancer Cousin   . Hypertension Mother   . Lung cancer Father   . Diabetes Father   . Allergic rhinitis Maternal Aunt   . Asthma Maternal Aunt   . Angioedema Neg Hx   . Atopy Neg Hx   . Eczema Neg Hx   .  Immunodeficiency Neg Hx   . Urticaria Neg Hx       Social History   Socioeconomic History  . Marital status: Married    Spouse name: Not on file  . Number of children: Not on file  . Years of education: Not on file  . Highest education level: Not on file  Occupational History  . Not on file  Tobacco Use  . Smoking status: Former Smoker    Packs/day: 0.25    Years: 25.00    Pack years: 6.25    Types: Cigarettes  . Smokeless tobacco: Never Used  .  Tobacco comment: uses vapor occasionally  Vaping Use  . Vaping Use: Never used  Substance and Sexual Activity  . Alcohol use: Not Currently    Comment: wine occ  . Drug use: Yes    Types: Marijuana    Comment: 2-3 x per month  . Sexual activity: Not Currently  Other Topics Concern  . Not on file  Social History Narrative   Right handed   Drinks caffeine   One story home   Social Determinants of Health   Financial Resource Strain: Not on file  Food Insecurity: Not on file  Transportation Needs: Not on file  Physical Activity: Not on file  Stress: Not on file  Social Connections: Not on file    Allergies  Allergen Reactions  . Naproxen Sodium Hives    Can take ibuprofen without causing any hives     Current Outpatient Medications:  .  albuterol (PROAIR HFA) 108 (90 Base) MCG/ACT inhaler, Inhale 2 puffs into the lungs every 6 (six) hours as needed for wheezing or shortness of breath., Disp: 8 g, Rfl: 11 .  azelastine (ASTELIN) 0.1 % nasal spray, Place 1-2 sprays into both nostrils 2 (two) times daily as needed (drainage). Use in each nostril as directed, Disp: 30 mL, Rfl: 5 .  buPROPion (WELLBUTRIN XL) 150 MG 24 hr tablet, Take 150 mg by mouth daily., Disp: , Rfl:  .  diclofenac Sodium (VOLTAREN) 1 % GEL, Apply 2 g topically 4 (four) times daily as needed (pain)., Disp: , Rfl:  .  docusate sodium (COLACE) 100 MG capsule, Take 1 capsule (100 mg total) by mouth 2 (two) times daily., Disp: 60 capsule, Rfl: 0 .   Dolutegravir-lamiVUDine (DOVATO) 50-300 MG TABS, Take 1 tablet by mouth daily., Disp: 30 tablet, Rfl: 5 .  fluticasone (FLOVENT HFA) 110 MCG/ACT inhaler, Inhale 2 puffs into the lungs in the morning and at bedtime. with spacer and rinse mouth afterwards., Disp: 1 each, Rfl: 5 .  hydrochlorothiazide (HYDRODIURIL) 25 MG tablet, Take 1 tablet (25 mg total) by mouth daily., Disp: 90 tablet, Rfl: 3 .  hydrOXYzine (ATARAX/VISTARIL) 25 MG tablet, Take 2 tablets (50 mg total) by mouth every 8 (eight) hours as needed., Disp: 90 tablet, Rfl: 3 .  ondansetron (ZOFRAN-ODT) 4 MG disintegrating tablet, Take 1 tablet (4 mg total) by mouth every 8 (eight) hours as needed for nausea or vomiting., Disp: 15 tablet, Rfl: 0 .  Rimegepant Sulfate (NURTEC) 75 MG TBDP, Take 75 mg by mouth daily as needed., Disp: 16 tablet, Rfl: 5 .  sertraline (ZOLOFT) 100 MG tablet, Take 1.5 tablets (150 mg total) by mouth at bedtime., Disp: 90 tablet, Rfl: 3 .  traZODone (DESYREL) 50 MG tablet, Take 100 mg by mouth at bedtime., Disp: , Rfl:  .  triamcinolone ointment (KENALOG) 0.5 %, APPLY TOPICALLY TO THE AFFECTED AREA TWICE DAILY (Patient taking differently: Apply 1 application topically 2 (two) times daily as needed (irritation).), Disp: 30 g, Rfl: 0 .  verapamil (CALAN-SR) 240 MG CR tablet, Take 240 mg by mouth daily., Disp: , Rfl:  .  gabapentin (NEURONTIN) 600 MG tablet, Take 0.5 tablets (300 mg total) by mouth 3 (three) times daily for 7 days., Disp: 10 tablet, Rfl: 0    Review of Systems  Constitutional: Negative for chills and fever.  HENT: Negative for congestion and sore throat.   Eyes: Negative for photophobia.  Respiratory: Negative for cough, shortness of breath and wheezing.   Cardiovascular: Negative for chest pain, palpitations  and leg swelling.  Gastrointestinal: Positive for abdominal pain. Negative for blood in stool, constipation, diarrhea, nausea and vomiting.  Genitourinary: Negative for dysuria, flank pain and  hematuria.  Musculoskeletal: Negative for back pain and myalgias.  Skin: Negative for rash.  Neurological: Negative for dizziness and weakness.  Hematological: Does not bruise/bleed easily.  Psychiatric/Behavioral: Negative for agitation, behavioral problems, dysphoric mood and suicidal ideas. The patient is not hyperactive.        Objective:   Physical Exam Constitutional:      General: She is not in acute distress.    Appearance: She is well-developed. She is not diaphoretic.  HENT:     Head: Normocephalic and atraumatic.     Mouth/Throat:     Pharynx: No oropharyngeal exudate.  Eyes:     General: No scleral icterus.    Conjunctiva/sclera: Conjunctivae normal.  Cardiovascular:     Rate and Rhythm: Normal rate and regular rhythm.  Pulmonary:     Effort: Pulmonary effort is normal. No respiratory distress.     Breath sounds: No wheezing.  Abdominal:     General: There is no distension.     Tenderness: There is abdominal tenderness. There is no rebound.    Musculoskeletal:        General: No tenderness. Normal range of motion.     Cervical back: Normal range of motion and neck supple.  Skin:    General: Skin is warm and dry.     Coloration: Skin is not jaundiced or pale.     Findings: No erythema or rash.  Neurological:     General: No focal deficit present.     Mental Status: She is alert and oriented to person, place, and time.     Motor: No abnormal muscle tone.     Coordination: Coordination normal.  Psychiatric:        Attention and Perception: Attention and perception normal.        Mood and Affect: Mood normal.        Speech: Speech normal.        Behavior: Behavior normal.        Thought Content: Thought content normal.        Cognition and Memory: Cognition and memory normal.        Judgment: Judgment normal.        Assessment & Plan:  Postoperative abdominal pain: This seems more than I would expect and I am ordering a CT of the abdomen pelvis with  contrast   Hepatitis C chronic without hepatic coma: Check hepatitis C RNA and start second month of Mavyret and return to clinic in 4 months for SVR 12  HIV disease continue Dovato and check labs today.  Genotype Ia she has Mavyret at home and she will start this.  We are going to need to get reapproval of her second month and explain that she did not start medications because we instructed her to not do so.  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  Pruritus seems largely resolved which seems to correlate with her having had resection of her hemangioma.  Headaches being followed by neurology.  Hypertension followed by Dr. Clovis Riley and on verapamil

## 2020-06-23 LAB — CBC WITH DIFFERENTIAL/PLATELET
Absolute Monocytes: 340 cells/uL (ref 200–950)
Basophils Absolute: 70 cells/uL (ref 0–200)
Basophils Relative: 1.3 %
Eosinophils Absolute: 513 cells/uL — ABNORMAL HIGH (ref 15–500)
Eosinophils Relative: 9.5 %
HCT: 40.4 % (ref 35.0–45.0)
Hemoglobin: 13 g/dL (ref 11.7–15.5)
Lymphs Abs: 2889 cells/uL (ref 850–3900)
MCH: 26.9 pg — ABNORMAL LOW (ref 27.0–33.0)
MCHC: 32.2 g/dL (ref 32.0–36.0)
MCV: 83.5 fL (ref 80.0–100.0)
MPV: 12.1 fL (ref 7.5–12.5)
Monocytes Relative: 6.3 %
Neutro Abs: 1588 cells/uL (ref 1500–7800)
Neutrophils Relative %: 29.4 %
Platelets: 217 10*3/uL (ref 140–400)
RBC: 4.84 10*6/uL (ref 3.80–5.10)
RDW: 13.1 % (ref 11.0–15.0)
Total Lymphocyte: 53.5 %
WBC: 5.4 10*3/uL (ref 3.8–10.8)

## 2020-06-23 LAB — COMPLETE METABOLIC PANEL WITH GFR
AG Ratio: 1.1 (calc) (ref 1.0–2.5)
ALT: 10 U/L (ref 6–29)
AST: 22 U/L (ref 10–35)
Albumin: 4.1 g/dL (ref 3.6–5.1)
Alkaline phosphatase (APISO): 121 U/L (ref 31–125)
BUN: 8 mg/dL (ref 7–25)
CO2: 31 mmol/L (ref 20–32)
Calcium: 9.6 mg/dL (ref 8.6–10.2)
Chloride: 100 mmol/L (ref 98–110)
Creat: 0.9 mg/dL (ref 0.50–1.10)
GFR, Est African American: 88 mL/min/{1.73_m2} (ref >=60)
GFR, Est Non African American: 76 mL/min/{1.73_m2} (ref >=60)
Globulin: 3.8 g/dL (calc) — ABNORMAL HIGH (ref 1.9–3.7)
Glucose, Bld: 72 mg/dL (ref 65–99)
Potassium: 3.9 mmol/L (ref 3.5–5.3)
Sodium: 140 mmol/L (ref 135–146)
Total Bilirubin: 0.6 mg/dL (ref 0.2–1.2)
Total Protein: 7.9 g/dL (ref 6.1–8.1)

## 2020-06-23 LAB — HEPATITIS C RNA QUANTITATIVE
HCV Quantitative Log: 1.65 log IU/mL — ABNORMAL HIGH
HCV RNA, PCR, QN: 44 IU/mL — ABNORMAL HIGH

## 2020-06-23 LAB — T-HELPER CELLS (CD4) COUNT (NOT AT ARMC)
Absolute CD4: 1500 cells/uL (ref 490–1740)
CD4 T Helper %: 46 % (ref 30–61)
Total lymphocyte count: 3245 cells/uL (ref 850–3900)

## 2020-06-23 LAB — RPR: RPR Ser Ql: NONREACTIVE

## 2020-06-23 LAB — HIV-1 RNA QUANT-NO REFLEX-BLD
HIV 1 RNA Quant: <20 Copies/mL
HIV-1 RNA Quant, Log: <1.3 Log cps/mL

## 2020-06-24 ENCOUNTER — Encounter: Payer: Self-pay | Admitting: Neurology

## 2020-06-24 NOTE — Progress Notes (Signed)
Per phone call with [employer] Rep Approval valid from 06/24/20 to 06/24/21 REF #: [id number].

## 2020-06-25 ENCOUNTER — Ambulatory Visit: Payer: Medicaid Other | Admitting: Neurology

## 2020-06-25 ENCOUNTER — Encounter: Payer: Self-pay | Admitting: Neurology

## 2020-06-25 VITALS — BP 130/85 | HR 74 | Ht 67.0 in | Wt 174.0 lb

## 2020-06-25 DIAGNOSIS — R0683 Snoring: Secondary | ICD-10-CM | POA: Diagnosis not present

## 2020-06-25 DIAGNOSIS — G4719 Other hypersomnia: Secondary | ICD-10-CM | POA: Diagnosis not present

## 2020-06-25 DIAGNOSIS — R351 Nocturia: Secondary | ICD-10-CM

## 2020-06-25 DIAGNOSIS — E663 Overweight: Secondary | ICD-10-CM

## 2020-06-25 DIAGNOSIS — G4700 Insomnia, unspecified: Secondary | ICD-10-CM

## 2020-06-25 DIAGNOSIS — R519 Headache, unspecified: Secondary | ICD-10-CM

## 2020-06-25 NOTE — Progress Notes (Signed)
Subjective:    Patient ID: Teresa [name] is a 50 y.o. female.  HPI     Star Age, MD, PhD [facility] [address], [address] [place], [place] [address]  Dear Benjamine Mola,  I saw your patient, Eliani Leclere, upon your kind request in my sleep clinic today for initial consultation of her sleep disorder, in particular, concern for underlying obstructive sleep apnea.  The patient is unaccompanied today.  As you know, Ms. Faulconer is a 50 year old right-handed woman with an underlying medical history of HIV disease, hepatitis C, hypertension, migraine headaches (followed by Dr. Tomi Likens at [facility] neurology), anemia, mood disorder including bipolar disease (by chart review), anxiety, arthritis, asthma, eczema, endometriosis, reflux disease, seasonal allergies and mildly overweight state, who reports snoring and excessive daytime somnolence.  I reviewed your office note from 04/14/2020. Her Epworth sleepiness score is 10 out of 24, fatigue severity score is 49 out of 63.  She has difficulty initiating and maintaining sleep for years.  She often wakes up in the middle of the night or early morning hours and may take 2 hours to go back to sleep.  Generally, she is in bed by 10 PM and rise time is around 6 AM.  She does not wake up rested.  She has been told that she snores loudly, lately, since her mom has been staying with her mom has been concerned about the loudness of her snoring.  She started snoring about 5 years ago.  She has nocturia about 2-3 times per average night and has woken up with a headache.  She had no significant weight fluctuation.  She lives with her husband and her youngest child age 17.  She has 2 grown children.  She has a TV in the bedroom and has a normal sleep timer.  She drinks caffeine in the form of coffee or cappuccino, 1 large cup per day.  She quit smoking in 2019 and does not currently drink any alcohol, for the past 6 months.  She has a history  of drinking alcohol or liquor to help her relax or sleep at night.  She quit doing this.  She denies any gasping sensation at night and has not been told that she has breathing pauses while asleep.  Her husband is out of town several days at a time but when he is at home, she is bothered by his snoring.  Her Past Medical History Is Significant For: Past Medical History:  Diagnosis Date  . Abdominal pain 06/20/2020  . Anemia    iron infusions x 2  . Anxiety   . Arthritis    Neck, left shoulder  . Asthma    uses inhaler 2-3 x week  . Bipolar disorder ([place])   . Cancer ([place])    Liver  . Depression   . Eczema   . Endometriosis   . Fibroids   . GERD (gastroesophageal reflux disease)   . Headache    Migraines  . Hepatitis 2009   Hep C  . HIV infection ([place])   . Hypertension   . Ovarian cyst 03/24/2020  . Pancreatitis   . Pruritus 03/11/2020  . Rash 09/18/2019  . Seasonal allergies   . SVD (spontaneous vaginal delivery)    x 1    Her Past Surgical History Is Significant For: Past Surgical History:  Procedure Laterality Date  . ABDOMINAL HYSTERECTOMY    . CESAREAN SECTION     x2  . DILATION AND CURETTAGE  OF UTERUS     x 2 - TAB  . HAND SURGERY Right    2 pins in wrist  . HYSTEROSCOPY N/A 10/11/2016   Procedure: HYSTEROSCOPY WITH HYDROTHERMAL ABLATION;  Surgeon: Donnamae Jude, MD;  Location: [facility];  Service: Gynecology;  Laterality: N/A;  Caryl Pina the [employer]TA rep will be here.  Confirmed on 09/02/16.   Marland Kitchen LAPAROSCOPIC BILATERAL SALPINGECTOMY Bilateral 07/11/2018   Procedure: LAPAROSCOPIC BILATERAL SALPINGECTOMY;  Surgeon: Ward, Honor Loh, MD;  Location: [facility];  Service: Gynecology;  Laterality: Bilateral;  . LAPAROSCOPIC HYSTERECTOMY N/A 07/11/2018   Procedure: HYSTERECTOMY TOTAL LAPAROSCOPIC;  Surgeon: Ward, Honor Loh, MD;  Location: [facility];  Service: Gynecology;  Laterality: N/A;  . LAPAROSCOPIC TUBAL LIGATION Bilateral 10/11/2016   Procedure: LAPAROSCOPIC TUBAL LIGATION WITH  FILSHIE CLIPS;  Surgeon: Donnamae Jude, MD;  Location: [facility];  Service: Gynecology;  Laterality: Bilateral;  . LAPAROSCOPY N/A 04/30/2020   Procedure: LAPAROSCOPY DIAGNOSTIC;  Surgeon: [name], MD;  Location: [place];  Service: General;  Laterality: N/A;  . MOUTH SURGERY    . OPEN PARTIAL HEPATECTOMY  Right 04/30/2020   Procedure: OPEN PARTIAL RIGHT HEPATECTOMY;  Surgeon: [name], MD;  Location: [place];  Service: General;  Laterality: Right;  . ROBOTIC ASSISTED LAPAROSCOPIC OVARIAN CYSTECTOMY Left 05/04/2019   Procedure: XI ROBOTIC ASSISTED LAPAROSCOPIC OVARIAN CYSTECTOMY;  Surgeon: Ward, Honor Loh, MD;  Location: [facility];  Service: Gynecology;  Laterality: Left;  . TONSILLECTOMY    . tumor biopsy   jan 5th 2022  . UMBILICAL HERNIA REPAIR N/A 07/11/2018   Procedure: LAPAROSCOPIC UMBILICAL HERNIA REPAIR;  Surgeon: Benjamine Sprague, DO;  Location: [facility];  Service: General;  Laterality: N/A;  . WISDOM TOOTH EXTRACTION      Her Family History Is Significant For: Family History  Problem Relation Age of Onset  . Breast cancer Paternal Grandmother   . Breast cancer Cousin   . Hypertension Mother   . Lung cancer Father   . Diabetes Father   . Allergic rhinitis Maternal Aunt   . Asthma Maternal Aunt   . Angioedema Neg Hx   . Atopy Neg Hx   . Eczema Neg Hx   . Immunodeficiency Neg Hx   . Urticaria Neg Hx     Her Social History Is Significant For: Social History   Socioeconomic History  . Marital status: Married    Spouse name: Not on file  . Number of children: Not on file  . Years of education: Not on file  . Highest education level: Not on file  Occupational History  . Not on file  Tobacco Use  . Smoking status: Former Smoker    Packs/day: 0.25    Years: 25.00    Pack years: 6.25    Types: Cigarettes  . Smokeless tobacco: Never Used  . Tobacco comment: uses vapor occasionally  Vaping Use  . Vaping Use: Never used  Substance and Sexual Activity  . Alcohol use: Not  Currently    Comment: wine occ  . Drug use: Yes    Types: Marijuana    Comment: 2-3 x per month  . Sexual activity: Not Currently  Other Topics Concern  . Not on file  Social History Narrative   Right handed   Drinks caffeine   One story home   Social Determinants of Health   Financial Resource Strain: Not on file  Food Insecurity: Not on file  Transportation Needs: Not on file  Physical Activity: Not on  file  Stress: Not on file  Social Connections: Not on file    Her Allergies Are:  Allergies  Allergen Reactions  . Naproxen Sodium Hives    Can take ibuprofen without causing any hives  :   Her Current Medications Are:  Outpatient Encounter Medications as of 06/25/2020  Medication Sig  . albuterol (PROAIR HFA) 108 (90 Base) MCG/ACT inhaler Inhale 2 puffs into the lungs every 6 (six) hours as needed for wheezing or shortness of breath.  Marland Kitchen azelastine (ASTELIN) 0.1 % nasal spray Place 1-2 sprays into both nostrils 2 (two) times daily as needed (drainage). Use in each nostril as directed  . buPROPion (WELLBUTRIN XL) 150 MG 24 hr tablet Take 150 mg by mouth daily.  . diclofenac Sodium (VOLTAREN) 1 % GEL Apply 2 g topically 4 (four) times daily as needed (pain).  Marland Kitchen docusate sodium (COLACE) 100 MG capsule Take 1 capsule (100 mg total) by mouth 2 (two) times daily.  . Dolutegravir-lamiVUDine (DOVATO) 50-300 MG TABS Take 1 tablet by mouth daily.  . fluticasone (FLOVENT HFA) 110 MCG/ACT inhaler Inhale 2 puffs into the lungs in the morning and at bedtime. with spacer and rinse mouth afterwards.  . hydrochlorothiazide (HYDRODIURIL) 25 MG tablet Take 1 tablet (25 mg total) by mouth daily.  . hydrOXYzine (ATARAX/VISTARIL) 25 MG tablet Take 2 tablets (50 mg total) by mouth every 8 (eight) hours as needed.  . Rimegepant Sulfate (NURTEC) 75 MG TBDP Take 75 mg by mouth daily as needed.  . sertraline (ZOLOFT) 100 MG tablet Take 1.5 tablets (150 mg total) by mouth at bedtime.  . traZODone  (DESYREL) 50 MG tablet Take 100 mg by mouth at bedtime.  . triamcinolone ointment (KENALOG) 0.5 % APPLY TOPICALLY TO THE AFFECTED AREA TWICE DAILY (Patient taking differently: Apply 1 application topically 2 (two) times daily as needed (irritation).)  . [DISCONTINUED] gabapentin (NEURONTIN) 600 MG tablet Take 0.5 tablets (300 mg total) by mouth 3 (three) times daily for 7 days.  . [DISCONTINUED] ondansetron (ZOFRAN-ODT) 4 MG disintegrating tablet Take 1 tablet (4 mg total) by mouth every 8 (eight) hours as needed for nausea or vomiting.  . [DISCONTINUED] verapamil (CALAN-SR) 240 MG CR tablet Take 240 mg by mouth daily.   No facility-administered encounter medications on file as of 06/25/2020.  :  Review of Systems:  Out of a complete 14 point review of systems, all are reviewed and negative with the exception of these symptoms as listed below: Review of Systems  Neurological:       Here for sleep consult, no prior sleep study. Hx of snoring and migraines.  Epworth Sleepiness Scale 0= would never doze 1= slight chance of dozing 2= moderate chance of dozing 3= high chance of dozing  Sitting and reading:1 Watching TV:2 Sitting inactive in a public place (ex. Theater or meeting):2 As a passenger in a car for an hour without a break:1 Lying down to rest in the afternoon:3 Sitting and talking to someone:0 Sitting quietly after lunch (no alcohol):1 In a car, while stopped in traffic:0 Total:10     Objective:  Neurological Exam  Physical Exam Physical Examination:   Vitals:   06/25/20 1343  BP: 130/85  Pulse: 74    General Examination: The patient is a very pleasant 50 y.o. female in no acute distress. She appears well-developed and well groomed.   HEENT: Normocephalic, atraumatic, pupils are equal, round and reactive to light, extraocular tracking is good without limitation to gaze excursion or nystagmus  noted. Hearing is grossly intact. Face is symmetric with normal facial  animation. Speech is clear with no dysarthria noted. There is no hypophonia. There is no lip, neck/head, jaw or voice tremor. Neck is supple with full range of passive and active motion. There are no carotid bruits on auscultation. Oropharynx exam reveals: moderate mouth dryness, adequate dental hygiene and moderate airway crowding, due to small airway entry, tonsils about 1-2+ bilaterally, Mallampati class II.  Slightly wider tongue, tongue protrudes centrally and palate elevates symmetrically.  She has a minimal overbite.  Neck circumference of 14 inches.  Chest: Clear to auscultation without wheezing, rhonchi or crackles noted.  Heart: S1+S2+0, regular and normal without murmurs, rubs or gallops noted.   Abdomen: Soft, non-tender and non-distended with normal bowel sounds appreciated on auscultation.  Extremities: There is no pitting edema in the distal lower extremities bilaterally.   Skin: Warm and dry without trophic changes noted.   Musculoskeletal: exam reveals no obvious joint deformities, tenderness or joint swelling or erythema.   Neurologically:  Mental status: The patient is awake, alert and oriented in all 4 spheres. Her immediate and remote memory, attention, language skills and fund of knowledge are appropriate. There is no evidence of aphasia, agnosia, apraxia or anomia. Speech is clear with normal prosody and enunciation. Thought process is linear. Mood is normal and affect is normal.  Cranial nerves II - XII are as described above under HEENT exam.  Motor exam: Normal bulk, strength and tone is noted. There is no tremor, Romberg is negative. Fine motor skills and coordination: grossly intact.  Cerebellar testing: No dysmetria or intention tremor. There is no truncal or gait ataxia.  Sensory exam: intact to light touch in the upper and lower extremities.  Gait, station and balance: She stands easily. No veering to one side is noted. No leaning to one side is noted. Posture is  age-appropriate and stance is narrow based. Gait shows normal stride length and normal pace. No problems turning are noted. Tandem walk is unremarkable, slightly difficulty in the beginning.  She reports feeling a little "wobbly" still after her recent surgery in January 2022.                Assessment and plan:  In summary, SHERAL PFAHLER is a very pleasant 50 y.o.-year old female with an underlying medical history of HIV disease, hepatitis C, hypertension, migraine headaches (followed by Dr. Tomi Likens at [facility] neurology), anemia, mood disorder including bipolar disease (by chart review), anxiety, arthritis, asthma, eczema, endometriosis, reflux disease, seasonal allergies and mildly overweight state, whose history and physical exam are concerning for obstructive sleep apnea (OSA). I had a long chat with the patient about my findings and the diagnosis of OSA, its prognosis and treatment options. We talked about medical treatments, surgical interventions and non-pharmacological approaches. I explained in particular the risks and ramifications of untreated moderate to severe OSA, especially with respect to developing cardiovascular disease down the Road, including congestive heart failure, difficult to treat hypertension, cardiac arrhythmias, or stroke. Even type 2 diabetes has, in part, been linked to untreated OSA. Symptoms of untreated OSA include daytime sleepiness, memory problems, mood irritability and mood disorder such as depression and anxiety, lack of energy, as well as recurrent headaches, especially morning headaches. We talked about trying to maintain a healthy lifestyle in general, as well as the importance of weight control. We also talked about the importance of good sleep hygiene. I recommended the following at this time: sleep study.  I explained the sleep test procedure to the patient and also outlined possible surgical and non-surgical treatment options of OSA, including the use of a  custom-made dental device (which would require a referral to a specialist dentist or oral surgeon), upper airway surgical options, such as traditional UPPP or a novel less invasive surgical option in the form of Inspire hypoglossal nerve stimulation (which would involve a referral to an ENT surgeon). I also explained the CPAP treatment option to the patient, who indicated that she would be willing to try CPAP if the need arises. I explained the importance of being compliant with PAP treatment, not only for insurance purposes but primarily to improve Her symptoms, and for the patient's long term health benefit, including to reduce Her cardiovascular risks. I answered all her questions today and the patient was in agreement. I plan to see her back after the sleep study is completed and encouraged her to call with any interim questions, concerns, problems or updates.   Thank you very much for allowing me to participate in the care of this nice patient. If I can be of any further assistance to you please do not hesitate to call me at [phone].  Sincerely,   Star Age, MD, PhD

## 2020-06-25 NOTE — Patient Instructions (Signed)

## 2020-07-02 ENCOUNTER — Telehealth: Payer: Self-pay

## 2020-07-02 DIAGNOSIS — R351 Nocturia: Secondary | ICD-10-CM

## 2020-07-02 DIAGNOSIS — G4719 Other hypersomnia: Secondary | ICD-10-CM

## 2020-07-02 DIAGNOSIS — E663 Overweight: Secondary | ICD-10-CM

## 2020-07-02 DIAGNOSIS — R519 Headache, unspecified: Secondary | ICD-10-CM

## 2020-07-02 NOTE — Telephone Encounter (Signed)
HST order placed. 

## 2020-07-02 NOTE — Telephone Encounter (Signed)
[employer] denied in lab sleep study but will approve a home study. Need HST order. Thanks

## 2020-07-07 ENCOUNTER — Telehealth: Payer: Self-pay

## 2020-07-07 ENCOUNTER — Other Ambulatory Visit: Payer: Self-pay | Admitting: Infectious Disease

## 2020-07-07 DIAGNOSIS — R1013 Epigastric pain: Secondary | ICD-10-CM

## 2020-07-07 NOTE — Telephone Encounter (Signed)
Received call from [place], requesting that provider change CT of abdomen and pelvis with / without contrast to just with contrast. Will route to provider.   Beryle Flock, RN

## 2020-07-07 NOTE — Telephone Encounter (Signed)
Ok thanks [employer]!

## 2020-07-07 NOTE — Telephone Encounter (Signed)
I put in the order but can they change it to [employer] for [employer]

## 2020-07-07 NOTE — Telephone Encounter (Signed)
RN spoke to [place] at [employer], she says the order is okay and that they can use it even though it says [facility]ac[facility] Pontes.   Beryle Flock, RN

## 2020-07-08 ENCOUNTER — Ambulatory Visit (HOSPITAL_COMMUNITY)
Admission: RE | Admit: 2020-07-08 | Discharge: 2020-07-08 | Disposition: A | Discharge status: Home / Self Care | Payer: Medicaid Other | Source: Ambulatory Visit | Attending: Infectious Disease | Admitting: Infectious Disease

## 2020-07-08 ENCOUNTER — Encounter (HOSPITAL_COMMUNITY): Payer: Self-pay

## 2020-07-08 ENCOUNTER — Other Ambulatory Visit: Payer: Self-pay

## 2020-07-08 DIAGNOSIS — R1013 Epigastric pain: Secondary | ICD-10-CM | POA: Diagnosis present

## 2020-07-08 DIAGNOSIS — B20 Human immunodeficiency virus [HIV] disease: Secondary | ICD-10-CM | POA: Diagnosis present

## 2020-07-08 MED ORDER — IOHEXOL 300 MG/ML  SOLN
100.0000 mL | Freq: Once | INTRAMUSCULAR | Status: AC | PRN
  Administered 2020-07-08: 100 mL via INTRAVENOUS

## 2020-07-09 MED ORDER — TRIAMCINOLONE ACETONIDE 0.5 % EX OINT: TOPICAL_OINTMENT | Freq: Two times a day (BID) | CUTANEOUS | 0 refills | Status: AC

## 2020-07-09 NOTE — Progress Notes (Signed)
Spoke with patient and she will contact her surgeon, Dr. Michaelle Birks and follow up.

## 2020-07-09 NOTE — Telephone Encounter (Signed)
Patient states she continues to use cream as needed.

## 2020-07-14 ENCOUNTER — Other Ambulatory Visit: Payer: Self-pay | Admitting: Family Medicine

## 2020-07-14 DIAGNOSIS — L299 Pruritus, unspecified: Secondary | ICD-10-CM

## 2020-07-23 ENCOUNTER — Other Ambulatory Visit (HOSPITAL_COMMUNITY): Payer: Self-pay | Admitting: General Surgery

## 2020-07-23 ENCOUNTER — Ambulatory Visit (HOSPITAL_COMMUNITY): Payer: Self-pay

## 2020-07-23 DIAGNOSIS — R9389 Abnormal findings on diagnostic imaging of other specified body structures: Secondary | ICD-10-CM

## 2020-07-25 ENCOUNTER — Telehealth (HOSPITAL_COMMUNITY): Payer: Self-pay

## 2020-07-28 ENCOUNTER — Encounter (HOSPITAL_COMMUNITY): Payer: Self-pay

## 2020-07-28 NOTE — Progress Notes (Unsigned)
    [name] Female, 50 y.o., 18-Mar-1971  MRN:  [id number] Phone:  [phone] [name])       PCP:  Azzie Glatter, FNP Coverage:  [place] Medicaid Prepaid Health Plan/[place] Medicaid Healthy [facility]  Next Appt With Radiology ([facility]-CT 3) 08/04/2020 at 11:00 AM          FW: Questions from Dr Teresa [name], Radiology about aspiration order Received: Today  Message Details  Suttle, Rosanne Ashing, MD  Lenore Cordia  Previous Messages  ----- Message -----  From: Teresa Bolt, MD  Sent: 07/28/2020  8:32 AM EDT  To: Suzette Battiest, MD  Subject: FW: Questions from Dr Teresa [name], Radiology abou*   Dr. Serafina [name],  I just received the below message about my patient Teresa [name], I'm sorry I was out of town last week. I actually do not want any aspiration done on her. That fluid at the site of her liver resection is expected. Her postoperative pain has been ongoing and I think is unrelated to the fluid. Sorry for the confusion and thanks for reaching out to clarify.  Wilburn Cornelia  [phone] if questions   ----- Message -----  From: Conni Slipper  Sent: 07/24/2020  1:19 PM EDT  To: Teresa Bolt, MD  Subject: Questions from Dr Teresa [name], Radiology about as*   05/10/2020 Lap diagnostic, open partial right hepatectomy by Dr Teresa [name]   Dr Teresa [name],   Dr Teresa [name] had instructed to have pt seen in IR for aspiration of fluid.   Dr Teresa [name] is inquiring:   Could you specify how we could help with [name] and what the clinical concern is? Are you wanting just an aspirate of the described "collection" in Morrison's pouch or of the surgical site?   Thank you,   Dylan Suttle    Pt is calling reporting, she is having pain to right upper and lower abd, the RLL over to umbilicus is really tender to touch. The pain started out intermittently, now is constant. Pt is also having burning pain to lower back pain.   Pt denies fever, no vomiting, a little nausea. Pt is having 2 BM's a day.   Thanks,    Abigail Butts  ----- Message -----  From: Lenore Cordia  Sent: 07/24/2020  1:10 PM EDT  To: Conni Slipper  Subject: FW: Aspiration                   ----- Message -----  From: Suzette Battiest, MD  Sent: 07/24/2020  9:13 AM EDT  To: Stark Klein, MD, Lenore Cordia  Subject: RE: Aspiration                  Dr. Barry [name],   Could you specify how we could help with Ms. Teresa [name] and what the clinical concern is? Are you wanting just an aspirate of the described "collection" in Morrison's pouch or of the surgical site?   Thank you,   Dylan Suttle    ----- Message -----  From: Lenore Cordia  Sent: 07/23/2020 11:34 AM EDT  To: Ir Procedure Requests  Subject: Aspiration                    Procedure Requested: aspiration, fluid collection, with imaging guidance    Reason for Procedure: abnormal finding on CT Scan    Provider Requesting: Stark Klein  Provider Telephone: [phone]    Other Info:

## 2020-07-28 NOTE — Progress Notes (Unsigned)
TM     2       Aidel L. Greenawalt Female, 50 y.o., 16-May-1970  MRN:  [id number] Phone:  [phone] [name])       PCP:  Azzie Glatter, FNP Coverage:  [place] Medicaid Prepaid Health Plan/[place] Medicaid Healthy [facility]  Next Appt With Radiology ([facility]-CT 3) 08/04/2020 at 11:00 AM           FW: Aspiration Received: 3 days ago  Message Details  Suttle, Rosanne Ashing, MD  [facility] E Approved for CT guided aspiration an possible drain placement of right hepatic fluid collection.   Dylan    Previous Messages  ----- Message -----  From: Stark Klein, MD  Sent: 07/24/2020 10:15 PM EDT  To: Suzette Battiest, MD  Subject: RE: Aspiration                  Surgical site.  Thx  FB  ----- Message -----  From: Suzette Battiest, MD  Sent: 07/24/2020  9:13 AM EDT  To: Stark Klein, MD, Lenore Cordia  Subject: RE: Aspiration                  Dr. Barry Dienes,   Could you specify how we could help with Ms. Midgley and what the clinical concern is? Are you wanting just an aspirate of the described "collection" in Morrison's pouch or of the surgical site?   Thank you,   Dylan Suttle    ----- Message -----  From: Lenore Cordia  Sent: 07/23/2020 11:34 AM EDT  To: Ir Procedure Requests  Subject: Aspiration                    Procedure Requested: aspiration, fluid collection, with imaging guidance    Reason for Procedure: abnormal finding on CT Scan    Provider Requesting: Stark Klein  Provider Telephone: [phone]    Other Info:

## 2020-08-01 ENCOUNTER — Other Ambulatory Visit: Payer: Self-pay

## 2020-08-01 ENCOUNTER — Ambulatory Visit: Payer: Medicaid Other | Admitting: Neurology

## 2020-08-01 DIAGNOSIS — G43109 Migraine with aura, not intractable, without status migrainosus: Secondary | ICD-10-CM

## 2020-08-01 MED ORDER — ONABOTULINUMTOXINA 100 UNITS IJ SOLR
155.0000 [IU] | Freq: Once | INTRAMUSCULAR | Status: AC
  Administered 2020-08-01: 155 [IU] via INTRAMUSCULAR

## 2020-08-01 NOTE — Progress Notes (Signed)
Botulinum Clinic   Procedure Note Botox  Attending: Dr. Metta Clines  Preoperative Diagnosis(es): Chronic migraine  Consent obtained from: The patient Benefits discussed included, but were not limited to decreased muscle tightness, increased joint range of motion, and decreased pain.  Risk discussed included, but were not limited pain and discomfort, bleeding, bruising, excessive weakness, venous thrombosis, muscle atrophy and dysphagia.  Anticipated outcomes of the procedure as well as he risks and benefits of the alternatives to the procedure, and the roles and tasks of the personnel to be involved, were discussed with the patient, and the patient consents to the procedure and agrees to proceed. A copy of the patient medication guide was given to the patient which explains the blackbox warning.  Patients identity and treatment sites confirmed Yes.  .  Details of Procedure: Skin was cleaned with alcohol. Prior to injection, the needle plunger was aspirated to make sure the needle was not within a blood vessel.  There was no blood retrieved on aspiration.    Following is a summary of the muscles injected  And the amount of Botulinum toxin used:  Dilution 200 units of Botox was reconstituted with 4 ml of preservative free normal saline. Time of reconstitution: At the time of the office visit (<30 minutes prior to injection)   Injections  155 total units of Botox was injected with a 30 gauge needle.  Injection Sites: L occipitalis: 15 units- 3 sites  R occiptalis: 15 units- 3 sites  L upper trapezius: 15 units- 3 sites R upper trapezius: 15 units- 3 sits          L paraspinal: 10 units- 2 sites R paraspinal: 10 units- 2 sites  Face L frontalis(2 injection sites):10 units   R frontalis(2 injection sites):10 units         L corrugator: 5 units   R corrugator: 5 units           Procerus: 5 units   L temporalis: 20 units R temporalis: 20 units   Agent:  200 units of botulinum Type  A (Onobotulinum Toxin type A) was reconstituted with 4 ml of preservative free normal saline.  Time of reconstitution: At the time of the office visit (<30 minutes prior to injection)     Total injected (Units): 155  Total wasted (Units): none wasted  Patient tolerated procedure well without complications.   Reinjection is anticipated in 3 months.

## 2020-08-04 ENCOUNTER — Ambulatory Visit (HOSPITAL_COMMUNITY): Payer: Medicaid Other

## 2020-08-04 ENCOUNTER — Encounter (HOSPITAL_COMMUNITY): Payer: Self-pay

## 2020-08-24 DIAGNOSIS — G4730 Sleep apnea, unspecified: Secondary | ICD-10-CM

## 2020-08-24 HISTORY — DX: Sleep apnea, unspecified: G47.30

## 2020-09-01 ENCOUNTER — Other Ambulatory Visit: Payer: Self-pay

## 2020-09-01 ENCOUNTER — Ambulatory Visit (INDEPENDENT_AMBULATORY_CARE_PROVIDER_SITE_OTHER): Payer: Medicaid Other | Admitting: Neurology

## 2020-09-01 DIAGNOSIS — R351 Nocturia: Secondary | ICD-10-CM

## 2020-09-01 DIAGNOSIS — E663 Overweight: Secondary | ICD-10-CM

## 2020-09-01 DIAGNOSIS — R519 Headache, unspecified: Secondary | ICD-10-CM

## 2020-09-01 DIAGNOSIS — G4733 Obstructive sleep apnea (adult) (pediatric): Secondary | ICD-10-CM | POA: Diagnosis not present

## 2020-09-01 DIAGNOSIS — G4719 Other hypersomnia: Secondary | ICD-10-CM

## 2020-09-02 NOTE — Progress Notes (Signed)
See procedure note.

## 2020-09-08 ENCOUNTER — Encounter: Payer: Self-pay | Admitting: Allergy

## 2020-09-08 ENCOUNTER — Other Ambulatory Visit: Payer: Self-pay

## 2020-09-08 ENCOUNTER — Ambulatory Visit (INDEPENDENT_AMBULATORY_CARE_PROVIDER_SITE_OTHER): Payer: Medicaid Other | Admitting: Allergy

## 2020-09-08 VITALS — BP 130/88 | HR 69 | Temp 96.7°F | Resp 16 | Ht 67.0 in | Wt 208.2 lb

## 2020-09-08 DIAGNOSIS — H1010 Acute atopic conjunctivitis, unspecified eye: Secondary | ICD-10-CM | POA: Insufficient documentation

## 2020-09-08 DIAGNOSIS — J3089 Other allergic rhinitis: Secondary | ICD-10-CM

## 2020-09-08 DIAGNOSIS — J302 Other seasonal allergic rhinitis: Secondary | ICD-10-CM

## 2020-09-08 DIAGNOSIS — H1013 Acute atopic conjunctivitis, bilateral: Secondary | ICD-10-CM

## 2020-09-08 DIAGNOSIS — J4530 Mild persistent asthma, uncomplicated: Secondary | ICD-10-CM

## 2020-09-08 MED ORDER — FLOVENT HFA 110 MCG/ACT IN AERO
2.0000 | INHALATION_SPRAY | Freq: Two times a day (BID) | RESPIRATORY_TRACT | 5 refills | Status: AC
Start: 2020-09-08 — End: ?

## 2020-09-08 MED ORDER — LEVOCETIRIZINE DIHYDROCHLORIDE 5 MG PO TABS: 5.0000 mg | ORAL_TABLET | Freq: Every evening | ORAL | 5 refills | Status: DC

## 2020-09-08 NOTE — Progress Notes (Signed)
Follow Up Note  RE: Teresa [name] MRN: [id number] DOB: 02/08/1971 Date of Office Visit: 09/08/2020  Referring provider: Azzie Glatter, FNP Primary care provider: Dorise Hiss, PA-C  Chief Complaint: Pruritus (Says no issues thus far.) and Allergic Rhinitis  (Pollen is causing headaches, taking zyrtec.)  History of Present Illness: I had the pleasure of seeing Teresa [name] for a follow up visit at the [facility] on 09/08/2020. She is a 50 y.o. female, who is being followed for pruritus, reactive airway disease and allergic rhinoconjunctivitis. Her previous allergy office visit was on 06/09/2020 with Teresa [name]. Today is a regular follow up visit.  Pruritus Resolved.   Reactive airway disease Patient is not sure about what happened to the Flovent inhaler.  Using albuterol 2 puffs in the mornings with no benefit.  Having shortness of breath with exertion mainly.  Denies any ER/urgent care visits or prednisone use since the last visit. Breathing is not as good as before.   Other allergic rhinitis Having some issues with headaches in the mornings and taking migraine medications which does not help. Headaches have been worsening for the past 2 weeks.   Taking zyrtec 10mg  twice a day, azelastine 2 sprays per nostril twice a day. Having some drainage and nasal congestion. Some nosebleeds.   She had an eye exam recently.  Follows with neurology for headaches - and have one course of Botox injections.  Had sleep study done as well.  Assessment and Plan: Teresa [name] is a 50 y.o. female with: Reactive airway disease Past history - Significant tobacco exposure history and used to be on Spiriva.  2021 spirometry showed: normal pattern with no improvement in FEV1 post bronchodilator treatment. Clinically feeling the same. Patient used albuterol 1-2 hours before visit. Interim history - did not start Flovent daily. Using albuterol 2 puffs in the morning with  minimal benefit. Feels like breathing is worse than before.   Today' spirometry showed some restriction. . Daily controller medication(s): start Flovent 146mcg 2 puffs twice a day with spacer and rinse mouth afterwards. . May use albuterol rescue inhaler 2 puffs every 4 to 6 hours as needed for shortness of breath, chest tightness, coughing, and wheezing. May use albuterol rescue inhaler 2 puffs 5 to 15 minutes prior to strenuous physical activities. Monitor frequency of use.  . Get spirometry at next visit.  Seasonal and perennial allergic rhinoconjunctivitis Past history - Perennial rhino conjunctivitis symptoms which flares during the spring. 2021 bloodwork borderline positive to grass pollen, cockroaches, tree pollen, ragweed/weed pollen.  Interim history - increased symptoms and worsening headaches. Follows with neurology. Had recent eye exam.  Continue environmental control measures.   Start Xyzal (levocetirizine) 5mg  daily at night.  Stop zyrtec.   May use azelastine nasal spray 1-2 sprays per nostril twice a day as needed for runny nose/drainage.  May use Flonase (fluticasone) nasal spray 1 spray per nostril twice a day as needed for nasal congestion.   Nasal saline spray (i.e., Simply Saline) or nasal saline lavage (i.e., NeilMed) is recommended as needed and prior to medicated nasal sprays.  Consider allergy injections for long term control if above medications do not help the symptoms - handout given.   Return in about 3 months (around 12/09/2020).  Follow up with neurology regarding the headaches.   Meds ordered this encounter  Medications  . fluticasone (FLOVENT HFA) 110 MCG/ACT inhaler    Sig: Inhale 2 puffs into the lungs in the morning  and at bedtime. with spacer and rinse mouth afterwards.    Dispense:  1 each    Refill:  5  . levocetirizine (XYZAL) 5 MG tablet    Sig: Take 1 tablet (5 mg total) by mouth every evening.    Dispense:  30 tablet    Refill:  5   Lab  Orders  No laboratory test(s) ordered today    Diagnostics: Spirometry:  Tracings reviewed. Her effort: Good reproducible efforts. FVC: 2.49L FEV1: 1.95L, 73% predicted FEV1/FVC ratio: 78% Interpretation: Spirometry consistent with possible restrictive disease.  Please see scanned spirometry results for details.  Medication List:  Current Outpatient Medications  Medication Sig Dispense Refill  . albuterol (PROAIR HFA) 108 (90 Base) MCG/ACT inhaler Inhale 2 puffs into the lungs every 6 (six) hours as needed for wheezing or shortness of breath. 8 g 11  . azelastine (ASTELIN) 0.1 % nasal spray Place 1-2 sprays into both nostrils 2 (two) times daily as needed (drainage). Use in each nostril as directed 30 mL 5  . buPROPion (WELLBUTRIN XL) 150 MG 24 hr tablet Take 150 mg by mouth daily.    . diclofenac Sodium (VOLTAREN) 1 % GEL Apply 2 g topically 4 (four) times daily as needed (pain).    . Dolutegravir-lamiVUDine (DOVATO) 50-300 MG TABS Take 1 tablet by mouth daily. 30 tablet 5  . fluticasone (FLOVENT HFA) 110 MCG/ACT inhaler Inhale 2 puffs into the lungs in the morning and at bedtime. with spacer and rinse mouth afterwards. 1 each 5  . hydrochlorothiazide (HYDRODIURIL) 25 MG tablet Take 1 tablet (25 mg total) by mouth daily. 90 tablet 3  . hydrOXYzine (ATARAX/VISTARIL) 25 MG tablet Take 2 tablets (50 mg total) by mouth every 8 (eight) hours as needed. 90 tablet 3  . hydrOXYzine (VISTARIL) 25 MG capsule Take 25 mg by mouth 3 (three) times daily as needed.    Marland Kitchen levocetirizine (XYZAL) 5 MG tablet Take 1 tablet (5 mg total) by mouth every evening. 30 tablet 5  . oxyCODONE (OXY IR/ROXICODONE) 5 MG immediate release tablet     . permethrin (ELIMITE) 5 % cream     . pregabalin (LYRICA) 75 MG capsule Take 75 mg by mouth 2 (two) times daily.    . Rimegepant Sulfate (NURTEC) 75 MG TBDP Take 75 mg by mouth daily as needed. 16 tablet 5  . sertraline (ZOLOFT) 100 MG tablet Take 1.5 tablets (150 mg  total) by mouth at bedtime. 90 tablet 3  . traZODone (DESYREL) 50 MG tablet Take 100 mg by mouth at bedtime.    . triamcinolone ointment (KENALOG) 0.5 % Apply topically 2 (two) times daily. 30 g 0  . venlafaxine (EFFEXOR) 37.5 MG tablet     . docusate sodium (COLACE) 100 MG capsule Take 1 capsule (100 mg total) by mouth 2 (two) times daily. 60 capsule 0   No current facility-administered medications for this visit.   Allergies: Allergies  Allergen Reactions  . Naproxen Sodium Hives    Can take ibuprofen without causing any hives Other reaction(s): Hives. Can take ibuprofen without causing any hives, Hives. Can take ibuprofen without causing any hives   I reviewed her past medical history, social history, family history, and environmental history and no significant changes have been reported from her previous visit.  Review of Systems  Constitutional: Negative for appetite change, chills, fever and unexpected weight change.  HENT: Positive for congestion and rhinorrhea.   Eyes: Negative for itching.  Respiratory: Positive for shortness of  breath. Negative for cough and chest tightness.   Cardiovascular: Negative for chest pain.  Gastrointestinal: Negative for abdominal pain.  Genitourinary: Negative for difficulty urinating.  Skin: Negative for rash.  Neurological: Positive for headaches.   Objective: BP 130/88   Pulse 69   Temp (!) 96.7 F (35.9 C)   Resp 16   Ht 5\' 7"  (1.702 m)   Wt 208 lb 3.2 oz (94.4 kg)   LMP 07/10/2018 (Exact Date)   SpO2 98%   BMI 32.61 kg/m  Body mass index is 32.61 kg/m. Physical Exam Vitals and nursing note reviewed.  Constitutional:      Appearance: Normal appearance. She is well-developed.  HENT:     Head: Normocephalic and atraumatic.     Right Ear: External ear normal.     Left Ear: External ear normal.     Nose: Nose normal.     Mouth/Throat:     Mouth: Mucous membranes are moist.     Pharynx: Oropharynx is clear.  Eyes:      Conjunctiva/sclera: Conjunctivae normal.  Cardiovascular:     Rate and Rhythm: Normal rate and regular rhythm.     Heart sounds: Normal heart sounds. No murmur heard. No friction rub. No gallop.   Pulmonary:     Effort: Pulmonary effort is normal.     Breath sounds: Normal breath sounds. No wheezing, rhonchi or rales.  Musculoskeletal:     Cervical back: Neck supple.  Skin:    General: Skin is warm.     Findings: No rash.  Neurological:     Mental Status: She is alert and oriented to person, place, and time.  Psychiatric:        Behavior: Behavior normal.    Previous notes and tests were reviewed. The plan was reviewed with the patient/family, and all questions/concerned were addressed.  It was my pleasure to see Teresa [name] today and participate in her care. Please feel free to contact me with any questions or concerns.  Sincerely,  Rexene Alberts, DO Allergy & Immunology  [facility] office: [place] office: [phone]

## 2020-09-08 NOTE — Patient Instructions (Addendum)
Breathing: . Daily controller medication(s): start Flovent 156mcg 2 puffs twice a day with spacer and rinse mouth afterwards. . May use albuterol rescue inhaler 2 puffs every 4 to 6 hours as needed for shortness of breath, chest tightness, coughing, and wheezing. May use albuterol rescue inhaler 2 puffs 5 to 15 minutes prior to strenuous physical activities. Monitor frequency of use.  . Breathing control goals:  o Full participation in all desired activities (may need albuterol before activity) o Albuterol use two times or less a week on average (not counting use with activity) o Cough interfering with sleep two times or less a month o Oral steroids no more than once a year o No hospitalizations  Rhinitis:  Environmental allergy panel was borderline positive to grass pollen, cockroaches, tree pollen, ragweed/weed pollen.   Continue environmental control measures.   Start Xyzal (levoceterizine) 5mg  daily at night.  Stop zyrtec.   May use azelastine nasal spray 1-2 sprays per nostril twice a day as needed for runny nose/drainage.  May use Flonase (fluticasone) nasal spray 1 spray per nostril twice a day as needed for nasal congestion.   Nasal saline spray (i.e., Simply Saline) or nasal saline lavage (i.e., NeilMed) is recommended as needed and prior to medicated nasal sprays.  Consider allergy injections for long term control if above medications do not help the symptoms - handout given.   Follow up in 3 months or sooner if needed.  Follow up with neurologist regarding the headaches/migraines.  Skin care recommendations  Bath time: . Always use lukewarm water. AVOID very hot or cold water. Marland Kitchen Keep bathing time to 5-10 minutes. . Do NOT use bubble bath. . Use a mild soap and use just enough to wash the dirty areas. . Do NOT scrub skin vigorously.  . After bathing, pat dry your skin with a towel. Do NOT rub or scrub the skin.  Moisturizers and prescriptions:  . ALWAYS apply  moisturizers immediately after bathing (within 3 minutes). This helps to lock-in moisture. . Use the moisturizer several times a day over the whole body. Kermit Balo summer moisturizers include: Aveeno, CeraVe, Cetaphil. Kermit Balo winter moisturizers include: Aquaphor, Vaseline, Cerave, Cetaphil, Eucerin, Vanicream. . When using moisturizers along with medications, the moisturizer should be applied about one hour after applying the medication to prevent diluting effect of the medication or moisturize around where you applied the medications. When not using medications, the moisturizer can be continued twice daily as maintenance.  Laundry and clothing: . Avoid laundry products with added color or perfumes. . Use unscented hypo-allergenic laundry products such as Tide free, Cheer free & gentle, and All free and clear.  . If the skin still seems dry or sensitive, you can try double-rinsing the clothes. . Avoid tight or scratchy clothing such as wool. . Do not use fabric softeners or dyer sheets.  Reducing Pollen Exposure . Pollen seasons: trees (spring), grass (summer) and ragweed/weeds (fall). Marland Kitchen Keep windows closed in your home and car to lower pollen exposure.  Susa Simmonds air conditioning in the bedroom and throughout the house if possible.  . Avoid going out in dry windy days - especially early morning. . Pollen counts are highest between 5 - 10 AM and on dry, hot and windy days.  . Save outside activities for late afternoon or after a heavy rain, when pollen levels are lower.  . Avoid mowing of grass if you have grass pollen allergy. Marland Kitchen Be aware that pollen can also be transported indoors  on people and pets.  . Dry your clothes in an automatic dryer rather than hanging them outside where they might collect pollen.  . Rinse hair and eyes before bedtime. Cockroach Allergen Avoidance Cockroaches are often found in the homes of densely populated urban areas, schools or commercial buildings, but these  creatures can lurk almost anywhere. This does not mean that you have a dirty house or living area. . Block all areas where roaches can enter the home. This includes crevices, wall cracks and windows.  . Cockroaches need water to survive, so fix and seal all leaky faucets and pipes. Have an exterminator go through the house when your family and pets are gone to eliminate any remaining roaches. Marland Kitchen Keep food in lidded containers and put pet food dishes away after your pets are done eating. Vacuum and sweep the floor after meals, and take out garbage and recyclables. Use lidded garbage containers in the kitchen. Wash dishes immediately after use and clean under stoves, refrigerators or toasters where crumbs can accumulate. Wipe off the stove and other kitchen surfaces and cupboards regularly.

## 2020-09-08 NOTE — Addendum Note (Signed)
Addended by: Star Age on: 09/08/2020 04:33 PM   Modules accepted: Orders

## 2020-09-08 NOTE — Assessment & Plan Note (Signed)
Past history - Perennial rhino conjunctivitis symptoms which flares during the spring. 2021 bloodwork borderline positive to grass pollen, cockroaches, tree pollen, ragweed/weed pollen.  Interim history - increased symptoms and worsening headaches. Follows with neurology. Had recent eye exam.  Continue environmental control measures.   Start Xyzal (levocetirizine) 5mg  daily at night.  Stop zyrtec.   May use azelastine nasal spray 1-2 sprays per nostril twice a day as needed for runny nose/drainage.  May use Flonase (fluticasone) nasal spray 1 spray per nostril twice a day as needed for nasal congestion.   Nasal saline spray (i.e., Simply Saline) or nasal saline lavage (i.e., NeilMed) is recommended as needed and prior to medicated nasal sprays.  Consider allergy injections for long term control if above medications do not help the symptoms - handout given.

## 2020-09-08 NOTE — Procedures (Signed)
[facility] [place] TEST (Watch PAT)  STUDY DATE: 09/01/20  DOB: 1970/07/28  MRN: [id number]  ORDERING CLINICIAN: Star Age, MD, PhD   REFERRING CLINICIAN: Juanda Crumble, PA   CLINICAL INFORMATION/HISTORY: 51 year old right-handed woman with an underlying medical history of HIV disease, hepatitis C, hypertension, migraine headaches (followed by Dr. Tomi Likens at [facility] neurology), anemia, mood disorder including bipolar disease (by chart review), anxiety, arthritis, asthma, eczema, endometriosis, reflux disease, seasonal allergies and mildly overweight state, who reports snoring and excessive daytime somnolence.   Epworth sleepiness score: 10/24.  BMI: 27.3 kg/m  Neck Circumference: 14 "  FINDINGS:   Total Record Time (hours, min): 7 H 52 min  Total Sleep Time (hours, min):  7 H 27 min   Percent REM (%):    40.34 %   Calculated pAHI (per hour): 23.9      REM pAHI: 35.1   NREM pAHI: 16.4 Supine AHI: 25.9   Oxygen Saturation (%) Mean: 94  Minimum oxygen saturation (%):        86   O2 Saturation Range (%): 86-99  O2Saturation (minutes) <=88%: 1.2 min  Pulse Mean (bpm):    72  Pulse Range (59-90)   IMPRESSION: OSA (obstructive sleep apnea)  RECOMMENDATION:  This home sleep test demonstrates moderate obstructive sleep apnea with a total AHI of 23.9/hour and O2 nadir of 86%.  Intermittent mild to moderate snoring was noted.  Treatment with positive airway pressure is recommended. The patient will be advised to proceed with an autoPAP titration/trial at home for now. A full night titration study may be considered to optimize treatment settings, if needed down the road. Please note that untreated obstructive sleep apnea may carry additional perioperative morbidity. Patients with significant obstructive sleep apnea should receive perioperative PAP therapy and the surgeons and particularly the anesthesiologist should be informed of the diagnosis and the severity of  the sleep disordered breathing. The patient should be cautioned not to drive, work at heights, or operate dangerous or heavy equipment when tired or sleepy. Review and reiteration of good sleep hygiene measures should be pursued with any patient. Other causes of the patient's symptoms, including circadian rhythm disturbances, an underlying mood disorder, medication effect and/or an underlying medical problem cannot be ruled out based on this test. Clinical correlation is recommended. The patient and her referring provider will be notified of the test results. The patient will be seen in follow up in sleep clinic at [facility].  I certify that I have reviewed the raw data recording prior to the issuance of this report in accordance with the standards of the American Academy of Sleep Medicine (AASM).  INTERPRETING PHYSICIAN:  Star Age, MD, PhD  Board Certified in Neurology and Sleep Medicine  [facility] [address] [place], [place] [address] [phone]  Sleep Summary  Oxygen Saturation Statistics   Start Study Time: End Study Time: Total Recording Time:        10:08:58 PM 6:01:31 AM   7 h, 52 min  Total Sleep Time % REM of Sleep Time:  7 h, 27 min  40.3    Mean: 94 Minimum: 86 Maximum: 99  Mean of Desaturations Nadirs (%):   91  Oxygen Desatur. %:  4-9 10-20 >20 Total  Events Number Total   89  23 79.5 20.5  0 0.0  112 100.0  Oxygen Saturation: <90 <=88 <85 <80 <70  Duration (minutes): Sleep % 2.7 0.6 1.2 0.0 0.3 0.0  0.0 0.0 0.0 0.0     Respiratory Indices      Total Events REM NREM All Night  pRDI: pAHI 3%: ODI 4%: pAHIc 3%: % CSR: pAHI 4%:  182  178  112  0 0.0  112 36.1 35.1 19.1 0.0 16.6 16.4 12.4 0.0 24.5 23.9 15.1 0.0  15.1       Pulse Rate Statistics during Sleep (BPM)      Mean: 72 Minimum: 59 Maximum: 90       Body Position Statistics  Position Supine Prone Right Left Non-Supine  Sleep (min) 388.5 0.0  1.0 58.0 59.0  Sleep % 86.8 0.0 0.2 13.0 13.2  pRDI 26.5 N/A N/A 8.3 11.2  pAHI 3% 25.9 N/A N/A 8.3 11.2  ODI 4% 16.1 N/A N/A 6.2 8.2     Snoring Statistics Snoring Level (dB) >40 >50 >60 >70 >80 >Threshold (45)  Sleep (min) 409.3 4.9 1.5 0.0 0.0 13.1  Sleep % 91.5 1.1 0.3 0.0 0.0 2.9

## 2020-09-08 NOTE — Assessment & Plan Note (Signed)
Past history - Significant tobacco exposure history and used to be on Spiriva.  2021 spirometry showed: normal pattern with no improvement in FEV1 post bronchodilator treatment. Clinically feeling the same. Patient used albuterol 1-2 hours before visit. Interim history - did not start Flovent daily. Using albuterol 2 puffs in the morning with minimal benefit. Feels like breathing is worse than before.   Today' spirometry showed some restriction. . Daily controller medication(s): start Flovent 168mcg 2 puffs twice a day with spacer and rinse mouth afterwards. . May use albuterol rescue inhaler 2 puffs every 4 to 6 hours as needed for shortness of breath, chest tightness, coughing, and wheezing. May use albuterol rescue inhaler 2 puffs 5 to 15 minutes prior to strenuous physical activities. Monitor frequency of use.  . Get spirometry at next visit.

## 2020-09-08 NOTE — Progress Notes (Signed)
Patient referred by Juanda Crumble, PA, seen by me on 06/25/20, patient had a HST on 09/01/20.    Please call and notify the patient that the recent home sleep test showed obstructive sleep apnea in the moderate range. I recommend treatment in the form of autoPAP, which means, that we don't have to bring her in for a sleep study with CPAP, but will let her start using a so called autoPAP machine at home, which is a CPAP-like machine with self-adjusting pressures. We will send the order to a local DME company (of her choice, or as per insurance requirement). The DME representative will fit her with a mask, educate her on how to use the machine, how to put the mask on, etc. I have placed an order in the chart. Please send the order, talk to patient, send report to referring MD. We will need a FU in sleep clinic for 10 weeks post-PAP set up, please arrange that with me or one of our NPs. Also reinforce the need for compliance with treatment. Thanks,   Star Age, MD, PhD [facility])

## 2020-09-09 ENCOUNTER — Other Ambulatory Visit: Payer: Self-pay

## 2020-09-09 MED ORDER — PREDNISONE 10 MG (21) PO TBPK: ORAL_TABLET | ORAL | 0 refills | Status: DC

## 2020-09-09 NOTE — Progress Notes (Signed)
If it has been persistent for 2-3 weeks, we can prescribe her a prednisone taper. If that doesn't work, then we would just have to wait for the Botox to take effect. She should be limiting Tylenol (and all over the counter pain relievers) to no more than 2 days out of the week. Nurtec does not cause rebound headache but unfortunately still allotted a limited supply   Script sent.

## 2020-09-10 ENCOUNTER — Ambulatory Visit: Payer: Medicaid Other | Attending: Surgery | Admitting: Physical Therapy

## 2020-09-10 ENCOUNTER — Other Ambulatory Visit: Payer: Self-pay

## 2020-09-10 ENCOUNTER — Encounter: Payer: Self-pay | Admitting: Physical Therapy

## 2020-09-10 DIAGNOSIS — R252 Cramp and spasm: Secondary | ICD-10-CM | POA: Insufficient documentation

## 2020-09-10 DIAGNOSIS — R109 Unspecified abdominal pain: Secondary | ICD-10-CM | POA: Insufficient documentation

## 2020-09-10 DIAGNOSIS — R293 Abnormal posture: Secondary | ICD-10-CM

## 2020-09-10 NOTE — Therapy (Signed)
[facility] [address] [place], [place], [address] Phone: [phone]   Fax:  [phone]  Physical Therapy Evaluation  Patient Details  Name: Teresa [name] MRN: [id number] Date of Birth: May 16, 1970 Referring Provider (PT): [name], MD   Encounter Date: 09/10/2020   PT End of Session - 09/10/20 1634    Visit Number 1    Date for PT Re-Evaluation 12/03/20    PT Start Time 1619    PT Stop Time 1655    PT Time Calculation (min) 36 min    Activity Tolerance Patient tolerated treatment well    Behavior During Therapy [facility] for tasks assessed/performed           Past Medical History:  Diagnosis Date  . Abdominal pain 06/20/2020  . Anemia    iron infusions x 2  . Anxiety   . Arthritis    Neck, left shoulder  . Asthma    uses inhaler 2-3 x week  . Bipolar disorder ([place])   . Cancer ([place])    Liver  . Depression   . Eczema   . Endometriosis   . Fibroids   . GERD (gastroesophageal reflux disease)   . Headache    Migraines  . Hepatitis 2009   Hep C  . HIV infection ([place])   . Hypertension   . Ovarian cyst 03/24/2020  . Pancreatitis   . Pruritus 03/11/2020  . Rash 09/18/2019  . Seasonal allergies   . SVD (spontaneous vaginal delivery)    x 1    Past Surgical History:  Procedure Laterality Date  . ABDOMINAL HYSTERECTOMY    . CESAREAN SECTION     x2  . DILATION AND CURETTAGE OF UTERUS     x 2 - TAB  . HAND SURGERY Right    2 pins in wrist  . HYSTEROSCOPY N/A 10/11/2016   Procedure: HYSTEROSCOPY WITH HYDROTHERMAL ABLATION;  Surgeon: Donnamae Jude, MD;  Location: [facility];  Service: Gynecology;  Laterality: N/A;  Caryl Pina the HTA rep will be here.  Confirmed on 09/02/16.   Marland Kitchen LAPAROSCOPIC BILATERAL SALPINGECTOMY Bilateral 07/11/2018   Procedure: LAPAROSCOPIC BILATERAL SALPINGECTOMY;  Surgeon: Ward, Honor Loh, MD;  Location: [facility];  Service: Gynecology;  Laterality: Bilateral;  . LAPAROSCOPIC  HYSTERECTOMY N/A 07/11/2018   Procedure: HYSTERECTOMY TOTAL LAPAROSCOPIC;  Surgeon: Ward, Honor Loh, MD;  Location: [facility];  Service: Gynecology;  Laterality: N/A;  . LAPAROSCOPIC TUBAL LIGATION Bilateral 10/11/2016   Procedure: LAPAROSCOPIC TUBAL LIGATION WITH FILSHIE CLIPS;  Surgeon: Donnamae Jude, MD;  Location: [facility];  Service: Gynecology;  Laterality: Bilateral;  . LAPAROSCOPY N/A 04/30/2020   Procedure: LAPAROSCOPY DIAGNOSTIC;  Surgeon: [name], MD;  Location: [place];  Service: General;  Laterality: N/A;  . MOUTH SURGERY    . OPEN PARTIAL HEPATECTOMY  Right 04/30/2020   Procedure: OPEN PARTIAL RIGHT HEPATECTOMY;  Surgeon: [name], MD;  Location: [place];  Service: General;  Laterality: Right;  . ROBOTIC ASSISTED LAPAROSCOPIC OVARIAN CYSTECTOMY Left 05/04/2019   Procedure: XI ROBOTIC ASSISTED LAPAROSCOPIC OVARIAN CYSTECTOMY;  Surgeon: Ward, Honor Loh, MD;  Location: [facility];  Service: Gynecology;  Laterality: Left;  . TONSILLECTOMY    . tumor biopsy   jan 5th 2022  . UMBILICAL HERNIA REPAIR N/A 07/11/2018   Procedure: LAPAROSCOPIC UMBILICAL HERNIA REPAIR;  Surgeon: Benjamine Sprague, DO;  Location: [facility];  Service: General;  Laterality: N/A;  . WISDOM TOOTH EXTRACTION  There were no vitals filed for this visit.    Subjective Assessment - 09/10/20 1625    Subjective Pt states she has been having pain upper quadrant on Rt side and wrapping around to the back    Limitations Lifting;Walking    How long can you walk comfortably? about 5 min    Patient Stated Goals be able to do walks around the house and do chores without stopping due to pain    Currently in Pain? Yes    Pain Score 4    gets up to 8-9/10   Pain Location Flank    Pain Orientation Right    Pain Descriptors / Indicators Shooting    Pain Type Surgical pain    Pain Radiating Towards lower right ribcage and wraps around to the back    Pain Onset More than a month ago    Pain Frequency Intermittent     Aggravating Factors  bending and being more active in the house    Pain Relieving Factors sit and let it rest    Effect of Pain on Daily Activities doing chores and can't go outside and walk    Multiple Pain Sites No              [facility] PT Assessment - 09/10/20 0001      Assessment   Medical Diagnosis G89.18 (ICD-10-CM) - Other acute postprocedural pain    Referring Provider (PT) [name], MD    Prior Therapy No      Precautions   Precautions None      Restrictions   Weight Bearing Restrictions No      Balance Screen   Has the patient fallen in the past 6 months No      [place] residence    Living Arrangements Spouse/significant other;Children   daughter 98 y/o (2 not living with)     Prior Function   Level of Independence Independent    Vocation Requirements house chores      Cognition   Overall Cognitive Status Within Functional Limits for tasks assessed      Functional Tests   Functional tests Squat;Single leg stance      Squat   Comments shift to toes and internal hip rotation; trunk collapses forward      Single Leg Stance   Comments weight shift Rt on Rt LE      Posture/Postural Control   Posture/Postural Control Postural limitations    Postural Limitations Anterior pelvic tilt;Weight shift right      ROM / Strength   AROM / PROM / Strength AROM;PROM;Strength      AROM   Overall AROM Comments lumbar flexion 30%      PROM   Overall PROM Comments hip ER 50%      Strength   Overall Strength Comments hip abduction and core 4/5      Flexibility   Soft Tissue Assessment /Muscle Length yes    Hamstrings 80%      Palpation   Palpation comment lumbar tight, liver mobility minimal, scar tissue tight and incicion TTP, abdominal hernia under upper incision,      Ambulation/Gait   Gait Pattern Trendelenburg                      Objective measurements completed on examination: See above  findings.     Pelvic Floor Special Questions - 09/10/20 0001    Prior Pregnancies Yes  Number of Pregnancies 3    Number of C-Sections 2    Number of Vaginal Deliveries 1    Currently Sexually Active No    Urinary Leakage Yes    How often have done that after surgery - 2x    Urinary urgency Yes   sometimes   Urinary frequency 80% normal    Fecal incontinence No    Fluid intake 80-40 oz                         PT Long Term Goals - 09/10/20 1632      PT LONG TERM GOAL #1   Title Pt will be able to walk for at least 15 minutes    Baseline 5 min    Time 12    Period Weeks    Status New    Target Date 12/03/20      PT LONG TERM GOAL #2   Title Be able to do regular household chores without stopping due to pain    Baseline can do about 20-30 minutes before stopping (maybe one room)    Time 12    Period Weeks    Status New    Target Date 12/03/20      PT LONG TERM GOAL #3   Title Be able to workout video with exercise video without stopping due to pain    Baseline 10 minutes then have to sit    Time 12    Period Weeks    Status New    Target Date 12/03/20      PT LONG TERM GOAL #4   Title Ind with advanced HEP    Baseline does not know    Time 12    Period Weeks    Status New    Target Date 12/03/20      PT LONG TERM GOAL #5   Title Pt will report at least 60% less pain    Time 12    Period Weeks    Status New    Target Date 12/03/20                  Plan - 09/10/20 1749    Clinical Impression Statement Pt presents to skilled PT with pain s/p liver cancer surgery.  Pt presents with significant tension in c-sections scar from 2 deliveries. Pt has tension and TTP in upper Rt quadrant around incision.  Pt has decreased fascial mobility and surgical hernia with core strength test. Pt has very tight lumbar paraspinals compensating for core weakness. She compensates as mentioned abovewith squat and single leg stand due to core weaknesses as  mentioned.   pt will benefit from skilled PT to address impairments so she can return to maximum function and exercise as part of a healthy lifestyle.    Personal Factors and Comorbidities Comorbidity 3+    Comorbidities c-section x 2, hysterectomy, liver Cancer, HIV    Examination-Activity Limitations Carry;Locomotion Level;Lift;Squat    Examination-Participation Restrictions Community Activity    Stability/Clinical Decision Making Evolving/Moderate complexity    Clinical Decision Making Moderate    Rehab Potential Excellent    PT Frequency 1x / week    PT Duration 12 weeks    PT Treatment/Interventions ADLs/Self Care Home Management;Biofeedback;Cryotherapy;Electrical Stimulation;Moist Heat;Therapeutic activities;Therapeutic exercise;Neuromuscular re-education;Patient/family education;Manual techniques;Passive range of motion;Dry needling;Taping    PT Next Visit Plan possibly DN lumbar, lumbar and h/s stretches, hip stretch, core strength; MFR and scar tissue mobs  Consulted and Agree with Plan of Care Patient           Patient will benefit from skilled therapeutic intervention in order to improve the following deficits and impairments:  Pain,Postural dysfunction,Impaired flexibility,Increased fascial restricitons,Decreased scar mobility,Decreased strength,Increased muscle spasms  Visit Diagnosis: Cramp and spasm  Flank pain  Abnormal posture     Problem List Patient Active Problem List   Diagnosis Date Noted  . Seasonal and perennial allergic rhinoconjunctivitis 09/08/2020  . Abdominal pain 06/20/2020  . Reactive airway disease 06/09/2020  . Liver hemangioma 05/07/2020  . Shortness of breath 04/02/2020  . Ovarian cyst 03/24/2020  . Rash 09/18/2019  . Hypertensive urgency 05/24/2019  . Asthma   . Depression   . Pelvic pain in female 05/16/2019  . Arthralgia 04/02/2019  . HTN, goal below 130/80 06/05/2018  . Iron deficiency anemia due to chronic blood loss 03/25/2014   . Abnormal uterine bleeding (AUB) 03/12/2014  . Bipolar affective disorder ([place]) 04/13/2012  . Endometriosis 04/13/2012  . Migraine headache 04/13/2012  . Hepatitis C infection 03/10/2011  . HIV disease ([place]) 03/10/2011    Jule Ser, PT 09/10/2020, 6:03 PM  [facility] [address], [place], [place], [address] Phone: [phone]   Fax:  [phone]  Name: Teresa [name] MRN: [id number] Date of Birth: 11/05/1970

## 2020-09-11 ENCOUNTER — Other Ambulatory Visit: Payer: Self-pay

## 2020-09-11 DIAGNOSIS — J45909 Unspecified asthma, uncomplicated: Secondary | ICD-10-CM

## 2020-09-11 MED ORDER — ALBUTEROL SULFATE HFA 108 (90 BASE) MCG/ACT IN AERS: 2.0000 | INHALATION_SPRAY | Freq: Four times a day (QID) | RESPIRATORY_TRACT | 1 refills | Status: AC | PRN

## 2020-09-12 ENCOUNTER — Other Ambulatory Visit: Payer: Self-pay | Admitting: Neurology

## 2020-09-25 ENCOUNTER — Telehealth: Payer: Self-pay

## 2020-09-25 DIAGNOSIS — B20 Human immunodeficiency virus [HIV] disease: Secondary | ICD-10-CM

## 2020-09-25 MED ORDER — DOVATO 50-300 MG PO TABS: 1.0000 | ORAL_TABLET | Freq: Every day | ORAL | 1 refills | Status: DC

## 2020-09-25 NOTE — Telephone Encounter (Signed)
Received faxed refill request from [facility] for patient's Dovato. Will send in refills.   Beryle Flock, RN

## 2020-09-29 ENCOUNTER — Other Ambulatory Visit: Payer: Self-pay

## 2020-09-29 ENCOUNTER — Encounter: Payer: Self-pay | Admitting: Neurology

## 2020-09-29 ENCOUNTER — Encounter: Payer: Self-pay | Admitting: Physical Therapy

## 2020-09-29 ENCOUNTER — Ambulatory Visit: Payer: Medicaid Other | Attending: Surgery | Admitting: Physical Therapy

## 2020-09-29 ENCOUNTER — Encounter: Payer: Self-pay | Admitting: Allergy

## 2020-09-29 DIAGNOSIS — R109 Unspecified abdominal pain: Secondary | ICD-10-CM | POA: Insufficient documentation

## 2020-09-29 DIAGNOSIS — R293 Abnormal posture: Secondary | ICD-10-CM | POA: Diagnosis present

## 2020-09-29 DIAGNOSIS — R10A0 Flank pain, unspecified side: Secondary | ICD-10-CM

## 2020-09-29 DIAGNOSIS — R252 Cramp and spasm: Secondary | ICD-10-CM | POA: Diagnosis not present

## 2020-09-29 NOTE — Therapy (Addendum)
[facility] [address], [place], [place], [address] Phone: [phone]   Fax:  [phone]  Physical Therapy Treatment  Patient Details  Name: Teresa [name] MRN: [id number] Date of Birth: Apr 13, 1971 Referring Provider (PT): [name], MD   Encounter Date: 09/29/2020   PT End of Session - 09/29/20 1650     Visit Number 2    Date for PT Re-Evaluation 12/03/20    Authorization Type healthy blue    PT Start Time 1616    PT Stop Time 1655    PT Time Calculation (min) 39 min    Activity Tolerance Patient tolerated treatment well    Behavior During Therapy [facility] for tasks assessed/performed             Past Medical History:  Diagnosis Date   Abdominal pain 06/20/2020   Anemia    iron infusions x 2   Anxiety    Arthritis    Neck, left shoulder   Asthma    uses inhaler 2-3 x week   Bipolar disorder ([facility])    Cancer ([place])    Liver   Depression    Eczema    Endometriosis    Fibroids    GERD (gastroesophageal reflux disease)    Headache    Migraines   Hepatitis 2009   Hep C   HIV infection ([place])    Hypertension    Ovarian cyst 03/24/2020   Pancreatitis    Pruritus 03/11/2020   Rash 09/18/2019   Seasonal allergies    SVD (spontaneous vaginal delivery)    x 1    Past Surgical History:  Procedure Laterality Date   ABDOMINAL HYSTERECTOMY     CESAREAN SECTION     x2   DILATION AND CURETTAGE OF UTERUS     x 2 - TAB   HAND SURGERY Right    2 pins in wrist   HYSTEROSCOPY N/A 10/11/2016   Procedure: HYSTEROSCOPY WITH HYDROTHERMAL ABLATION;  Surgeon: Donnamae Jude, MD;  Location: [facility];  Service: Gynecology;  Laterality: N/A;  Caryl Pina the HTA rep will be here.  Confirmed on 09/02/16.    LAPAROSCOPIC BILATERAL SALPINGECTOMY Bilateral 07/11/2018   Procedure: LAPAROSCOPIC BILATERAL SALPINGECTOMY;  Surgeon: Ward, Honor Loh, MD;  Location: [facility];  Service: Gynecology;  Laterality: Bilateral;    LAPAROSCOPIC HYSTERECTOMY N/A 07/11/2018   Procedure: HYSTERECTOMY TOTAL LAPAROSCOPIC;  Surgeon: Ward, Honor Loh, MD;  Location: [facility];  Service: Gynecology;  Laterality: N/A;   LAPAROSCOPIC TUBAL LIGATION Bilateral 10/11/2016   Procedure: LAPAROSCOPIC TUBAL LIGATION WITH FILSHIE CLIPS;  Surgeon: Donnamae Jude, MD;  Location: [facility];  Service: Gynecology;  Laterality: Bilateral;   LAPAROSCOPY N/A 04/30/2020   Procedure: LAPAROSCOPY DIAGNOSTIC;  Surgeon: [name], MD;  Location: [place];  Service: General;  Laterality: N/A;   MOUTH SURGERY     OPEN PARTIAL HEPATECTOMY  Right 04/30/2020   Procedure: OPEN PARTIAL RIGHT HEPATECTOMY;  Surgeon: [name], MD;  Location: [place];  Service: General;  Laterality: Right;   ROBOTIC ASSISTED LAPAROSCOPIC OVARIAN CYSTECTOMY Left 05/04/2019   Procedure: XI ROBOTIC ASSISTED LAPAROSCOPIC OVARIAN CYSTECTOMY;  Surgeon: Ward, Honor Loh, MD;  Location: [facility];  Service: Gynecology;  Laterality: Left;   TONSILLECTOMY     tumor biopsy   jan 5th 2022   UMBILICAL HERNIA REPAIR N/A 07/11/2018   Procedure: LAPAROSCOPIC UMBILICAL HERNIA REPAIR;  Surgeon: Benjamine Sprague, DO;  Location: [facility];  Service: General;  Laterality: N/A;  WISDOM TOOTH EXTRACTION      There were no vitals filed for this visit.   Subjective Assessment - 09/29/20 1623     Subjective Pt states the hernia feels sore and feels like it has gotten worse    Patient Stated Goals be able to do walks around the house and do chores without stopping due to pain    Currently in Pain? Yes    Pain Score 4     Pain Location Abdomen    Pain Orientation Right;Mid;Upper    Pain Descriptors / Indicators Sore    Pain Type Surgical pain    Pain Onset More than a month ago    Multiple Pain Sites No                               [facility] Adult PT Treatment/Exercise - 09/29/20 0001       Exercises   Exercises Lumbar      Lumbar Exercises: Stretches   Single Knee to Chest Stretch  Right;Left;3 reps;20 seconds    Double Knee to Chest Stretch 2 reps;20 seconds    Lower Trunk Rotation 5 reps;10 seconds    Figure 4 Stretch 2 reps;20 seconds      Manual Therapy   Manual Therapy Myofascial release    Myofascial Release abdominal fascial release and scar tissue massage                         PT Long Term Goals - 09/10/20 1632       PT LONG TERM GOAL #1   Title Pt will be able to walk for at least 15 minutes    Baseline 5 min    Time 12    Period Weeks    Status New    Target Date 12/03/20      PT LONG TERM GOAL #2   Title Be able to do regular household chores without stopping due to pain    Baseline can do about 20-30 minutes before stopping (maybe one room)    Time 12    Period Weeks    Status New    Target Date 12/03/20      PT LONG TERM GOAL #3   Title Be able to workout video with exercise video without stopping due to pain    Baseline 10 minutes then have to sit    Time 12    Period Weeks    Status New    Target Date 12/03/20      PT LONG TERM GOAL #4   Title Ind with advanced HEP    Baseline does not know    Time 12    Period Weeks    Status New    Target Date 12/03/20      PT LONG TERM GOAL #5   Title Pt will report at least 60% less pain    Time 12    Period Weeks    Status New    Target Date 12/03/20                   Plan - 09/29/20 1705     Clinical Impression Statement Today's session focused on MFR to abdomen due to a lot of distension as well as scar tissue releases. Pt was given initial HEP.  Pt felt good stretch with exercises done today.  She was also educated on some ways to  reduce some of the bloating with trouble shooting foods that can cause more gas especially since she has recently been eating a lot more fruit.    PT Treatment/Interventions ADLs/Self Care Home Management;Biofeedback;Cryotherapy;Electrical Stimulation;Moist Heat;Therapeutic activities;Therapeutic exercise;Neuromuscular  re-education;Patient/family education;Manual techniques;Passive range of motion;Dry needling;Taping    PT Next Visit Plan possibly DN lumbar, lumbar and h/s stretches, hip stretch, core strength; child pose ball roll out    Consulted and Agree with Plan of Care Patient             Patient will benefit from skilled therapeutic intervention in order to improve the following deficits and impairments:  Pain,Postural dysfunction,Impaired flexibility,Increased fascial restricitons,Decreased scar mobility,Decreased strength,Increased muscle spasms  Visit Diagnosis: Cramp and spasm  Flank pain  Abnormal posture     Problem List Patient Active Problem List   Diagnosis Date Noted   Seasonal and perennial allergic rhinoconjunctivitis 09/08/2020   Abdominal pain 06/20/2020   Reactive airway disease 06/09/2020   Liver hemangioma 05/07/2020   Shortness of breath 04/02/2020   Ovarian cyst 03/24/2020   Rash 09/18/2019   Hypertensive urgency 05/24/2019   Asthma    Depression    Pelvic pain in female 05/16/2019   Arthralgia 04/02/2019   HTN, goal below 130/80 06/05/2018   Iron deficiency anemia due to chronic blood loss 03/25/2014   Abnormal uterine bleeding (AUB) 03/12/2014   Bipolar affective disorder ([place]) 04/13/2012   Endometriosis 04/13/2012   Migraine headache 04/13/2012   Hepatitis C infection 03/10/2011   HIV disease ([place]) 03/10/2011    Camillo Flaming Desenglau, PT 09/29/2020, 5:14 PM  [facility] [address] [place], [place], [address] Phone: [phone]   Fax:  [phone]  Name: Teresa [name] MRN: [id number] Date of Birth: Aug 15, 1970  PHYSICAL THERAPY DISCHARGE SUMMARY  Visits from Start of Care: 2  Current functional level related to goals / functional outcomes:   See above goals for progress Remaining deficits: See above details   Education / Equipment: HEP   Patient agrees to discharge. Patient  goals were not met. Patient is being discharged due to the patient's request. Due to patient moving  Gustavus Bryant, PT 11/04/20 2:11 PM

## 2020-09-30 ENCOUNTER — Telehealth: Payer: Self-pay | Admitting: Neurology

## 2020-09-30 NOTE — Telephone Encounter (Signed)
Pt notified of recommendation, see my chart message from 09/30/20 for reference.

## 2020-09-30 NOTE — Telephone Encounter (Signed)
Please see my chart message for reference.  I would recommend that she establish care with a primary care physician first in the [facility] area.  The [facility] affiliated sleep physicians would be probably the way to go in terms of establishing care for sleep apnea, I recommend that she get a referral from her new PCP for this.  I am assuming she does not have an AutoPap machine yet?  She would need an order from her new sleep physician, unless she has an existing set up date already.

## 2020-10-09 ENCOUNTER — Other Ambulatory Visit: Payer: Self-pay | Admitting: Neurology

## 2020-10-17 ENCOUNTER — Ambulatory Visit: Payer: Medicaid Other | Admitting: Physical Therapy

## 2020-10-17 ENCOUNTER — Telehealth: Payer: Self-pay | Admitting: Physical Therapy

## 2020-10-17 NOTE — Telephone Encounter (Signed)
Patient did not show for appointment.  Patient was called and PT left message to please call us back.  Gustavus Bryant, PT 10/17/20 10:58 AM

## 2020-10-23 ENCOUNTER — Encounter: Payer: Medicaid Other | Admitting: Physical Therapy

## 2020-10-28 ENCOUNTER — Encounter: Payer: Medicaid Other | Admitting: Physical Therapy

## 2020-10-29 ENCOUNTER — Ambulatory Visit: Payer: Medicaid Other | Admitting: Infectious Disease

## 2020-10-31 ENCOUNTER — Ambulatory Visit: Payer: Medicaid Other | Admitting: Neurology

## 2020-11-04 ENCOUNTER — Encounter: Payer: Medicaid Other | Admitting: Physical Therapy

## 2020-11-07 ENCOUNTER — Encounter: Payer: Self-pay | Admitting: Neurology

## 2020-11-11 ENCOUNTER — Encounter: Payer: Medicaid Other | Admitting: Physical Therapy

## 2020-11-14 ENCOUNTER — Ambulatory Visit: Payer: Self-pay | Admitting: Surgery

## 2020-11-14 NOTE — H&P (Signed)
History of Present Illness: Teresa [name] is a 50 y.o. female who is seen today for follow up. She originally underwent an open wedge resection of a segment 7 liver mass that turned out to be a benign lesion. This was via a subcostal incision. She had extensive postoperative pain at her incision, however at her last visit with me in May her symptoms were improving. She was doing physical therapy and has seen a pain specialist. For the last month, she has had worsening pain in her upper abdomen at the midline aspect of her surgical scar. This gets worse with movement and she notices a bulge in the area. She does not have any obstructive symptoms.       Review of Systems: A complete review of systems was obtained from the patient.  I have reviewed this information and discussed as appropriate with the patient.  See HPI as well for other ROS.       Medical History: Past Medical History Past Medical History: Diagnosis Date  Abdominal pain, right lower quadrant    Abnormal uterine bleeding    Allergic rhinitis due to other allergen    Allergy 06/28/2007  Anemia    Anxiety 05/2012  Arthritis 09/2015  Bipolar disorder, unspecified (CMS-HCC) 08/01/2009  Clotting disorder (CMS-HCC) 2005  Depression    Dysmenorrhea    Endometriosis of uterus 10/12/2010  Excessive or frequent menstruation    Fibroid [id number]  Follicular cyst of ovary    GERD (gastroesophageal reflux disease) 10/29/2016  Hepatitis C carrier (CMS-HCC) 09/08/2011  HIV infection (CMS-HCC)     Dx 2009 when pregnant  Human immunodeficiency virus (HIV) disease (CMS-HCC) 08/01/2009  Hypertension 2018  Migraine, unspecified, without mention of intractable migraine without mention of status migrainosus 07/14/2009  Pancreatitis    Unspecified contraceptive management        Patient Active Problem List Diagnosis  HIV -AIDS with opportunistic infection, Symptomatic (CMS-HCC)  Hepatitis C  infection  Contraception  Endometriosis  Migraine headache  Bipolar affective disorder (CMS-HCC)  Abnormal uterine bleeding (AUB)  Iron deficiency anemia due to chronic blood loss  Chronic endometritis  HTN, goal below 130/80  Pelvic pain in female  Migraine with aura and without status migrainosus, not intractable  Arthralgia  Tendinitis of wrist     Past Surgical History Past Surgical History: Procedure Laterality Date  ABDOMINAL SURGERY   1996 1998 2010 2018  CESAREAN DELIVERY      CESAREAN SECTION       x3  DILATION AND CURETTAGE OF UTERUS       x2 with TAB  ENDOMETRIAL ABLATION   2018  hand surgery Right    HERNIA REPAIR   09/2016  HERNIA REPAIR   05/04/2019   S. Sakai umbilical hernia  HYSTERECTOMY   07/11/2018   Total Lap Hysterectomy  TUBAL LIGATION   2018      Allergies Allergies Allergen Reactions  Naprosyn [Naproxen] Hives      Current Outpatient Medications on File Prior to Visit Medication Sig Dispense Refill  fluticasone propionate (FLONASE) 50 mcg/actuation nasal spray        QUEtiapine (SEROQUEL) 50 MG tablet        abacavir-dolutegravir-lamiVUDine (TRIUMEQ) 600-50-300 mg tablet Take 1 tablet by mouth once daily    (Patient not taking: Reported on 02/06/2020  )      albuterol 90 mcg/actuation inhaler Inhale 2 inhalations into the lungs every 6 (six) hours as needed for Wheezing 1 each 2  benzonatate (TESSALON) 100 MG  capsule TAKE 1 CAPSULE ORALLY 3 TIMES PER DAY AS NEEDED      buPROPion (WELLBUTRIN) 75 MG tablet Take 1 tablet (75 mg total) by mouth 2 (two) times daily (Patient taking differently: Take 150 mg by mouth once daily   ) 60 tablet 11  diclofenac (VOLTAREN) 1 % topical gel Apply 2 g topically 4 (four) times daily (Patient not taking: Reported on 05/14/2020  ) 200 g 3  DOVATO 50-300 mg Tab Take 1 tablet by mouth once daily      EMGALITY SYRINGE 120 mg/mL Syrg INJECT 1 SYRINGE '120MG'$  UNDER THE SKIN EVERY 28 DAYS (Patient not taking: Reported on  02/06/2020) 1 mL 3  glycopyrronium tosylate (QBREXZA) 2.4 % towelette Apply 1 Application topically once daily 30 each 2  hydroCHLOROthiazide (HYDRODIURIL) 25 MG tablet Take 1 tablet (25 mg total) by mouth once daily 30 tablet 2  hydrOXYzine pamoate (VISTARIL) 25 MG capsule TAKE 1 CAPSULE BY MOUTH 3 TIMES A DAY AS NEEDED FOR ANXIETY, IRRITABILITY      levocetirizine (XYZAL) 5 MG tablet Take 5 mg by mouth every evening      methylPREDNISolone (MEDROL) 4 MG tablet Day one - 6 tab Day two - 5 tab Day three - 4 tabs Day four - 3 tabs Day five - 2 tabs Day six - 1 tab (Patient not taking: Reported on 02/06/2020  ) 21 tablet 0  pregabalin (LYRICA) 75 MG capsule Take 75 mg by mouth 2 (two) times daily      sertraline (ZOLOFT) 100 MG tablet Take 1 tablet (100 mg total) by mouth once daily 30 tablet 11  SUMAtriptan (IMITREX) 100 MG tablet Take 1 tablet (100 mg total) by mouth as directed for Migraine May take a second dose after 2 hours if needed. (Patient not taking: Reported on 05/14/2020  ) 20 tablet 1  venlafaxine (EFFEXOR) 37.5 MG tablet TAKE 1 TABLET(37.5 MG) BY MOUTH TWICE DAILY 30 tablet 0  verapamiL (CALAN-SR) 240 MG SR tablet Take 1 tablet (240 mg total) by mouth nightly 30 tablet 2   No current facility-administered medications on file prior to visit.     Family History Family History Problem Relation Age of Onset  High blood pressure (Hypertension) Mother    Diabetes type II Mother    Coronary Artery Disease (Blocked arteries around heart) Mother    Arthritis Mother    Lung cancer Father    Lung cancer Maternal Grandmother    Arthritis Maternal Grandmother    Stomach cancer Paternal Grandmother    Bipolar disorder Maternal Aunt    Arthritis Maternal Aunt    Arthritis Maternal Uncle    Breast cancer Neg Hx    Cervical cancer Neg Hx    Colon cancer Neg Hx    Uterine cancer Neg Hx    Ovarian cancer Neg Hx    Clotting disorder Neg Hx        Social History   Tobacco Use Smoking  Status Former Smoker  Packs/day: 0.50  Years: 20.00  Pack years: 10.00  Types: Cigarettes  Start date: 09/30/1991  Quit date: 09/29/2016  Years since quitting: 4.1 Smokeless Tobacco Never Used Tobacco Comment   Patient smokes around 2 cigarettes a day.     Social History Social History    Socioeconomic History  Marital status: Single  Number of children: 3  Highest education level: Some college, no degree Tobacco Use  Smoking status: Former Smoker     Packs/day: 0.50  Years: 20.00     Pack years: 10.00     Types: Cigarettes     Start date: 09/30/1991     Quit date: 09/29/2016     Years since quitting: 4.1  Smokeless tobacco: Never Used  Tobacco comment: Patient smokes around 2 cigarettes a day. Vaping Use  Vaping Use: Never used Substance and Sexual Activity  Alcohol use: Not Currently     Comment: socially   Drug use: Not Currently     Types: Marijuana     Comment: THC x5 joints per week  Sexual activity: Not Currently     Partners: Male     Birth control/protection: Surgical, Other-see comments     Comment: hysterectomy tubal ligation    Social Determinants of Health    Financial Resource Strain: Medium Risk  Difficulty of Paying Living Expenses: Somewhat hard Food Insecurity: Landscape architect Present  Worried About Charity fundraiser in the Last Year: Sometimes true  Arboriculturist in the Last Year: Sometimes true Transportation Needs: Unmet Transportation Needs  Lack of Transportation (Medical): Yes  Lack of Transportation (Non-Medical): Yes Physical Activity: Inactive  Days of Exercise per Week: 0 days  Minutes of Exercise per Session: 0 min Stress: Stress Concern Present  Feeling of Stress : Very much Social Connections: Moderately Isolated  Frequency of Communication with Friends and Family: More than three times a week  Frequency of Social Gatherings with Friends and Family: Never  Attends Religious Services: Never  Marine scientist or  Organizations: No  Attends Music therapist: Never  Marital Status: Married Housing Stability: Low Risk   Unable to Pay for Housing in the Last Year: No  Number of [place] in the Last Year: 2  Unstable Housing in the Last Year: No      Objective:     There were no vitals filed for this visit.  There is no height or weight on file to calculate BMI.   Physical Exam Constitutional:      General: She is not in acute distress. HENT:     Head: Normocephalic and atraumatic.  Eyes:     General: No scleral icterus.    Conjunctiva/sclera: Conjunctivae normal.  Pulmonary:     Effort: Pulmonary effort is normal. No respiratory distress.  Abdominal:     General: There is no distension.     Palpations: Abdomen is soft.     Comments: Well-healed lower midline scar, well-healed right subcostal scar with midline extension. There is a hernia defect palpable in the epigastric area at the midline portion of the subcostal incision. It is reducible.   Musculoskeletal:        General: Normal range of motion.     Cervical back: Normal range of motion.  Skin:    General: Skin is warm and dry.  Neurological:     General: No focal deficit present.     Mental Status: She is alert and oriented to person, place, and time.  Psychiatric:        Mood and Affect: Mood normal.        Behavior: Behavior normal.        Thought Content: Thought content normal.              Assessment and Plan: Diagnoses and all orders for this visit:   Ventral incisional hernia -     Abdominal Binder       50 yo female with a history of open  hepatic wedge resection. Now with an epigastric incisional hernia. I discussed options including repair vs expectant management. Since she is having pain she would like to proceed with repair of the hernia. I discussed that I do believe some of her pain is related to the hernia, but some of her pain was present before the hernia developed so I do not expect  hernia repair while entirely resolve her symptoms. She expressed understanding and agrees to proceed with open hernia repair with mesh placement. Will likely require some adhesiolysis. I will plan to observe her overnight in the hospital for pain management. She will be contacted to schedule a surgery date.   Michaelle Birks, [facility], Hepatobiliary and Pancreatic Surgery 11/14/20 4:42 PM

## 2020-11-14 NOTE — H&P (View-Only) (Signed)
History of Present Illness: Teresa [name] is a 50 y.o. female who is seen today for follow up. She originally underwent an open wedge resection of a segment 7 liver mass that turned out to be a benign lesion. This was via a subcostal incision. She had extensive postoperative pain at her incision, however at her last visit with me in May her symptoms were improving. She was doing physical therapy and has seen a pain specialist. For the last month, she has had worsening pain in her upper abdomen at the midline aspect of her surgical scar. This gets worse with movement and she notices a bulge in the area. She does not have any obstructive symptoms.       Review of Systems: A complete review of systems was obtained from the patient.  I have reviewed this information and discussed as appropriate with the patient.  See HPI as well for other ROS.       Medical History: Past Medical History Past Medical History: Diagnosis Date  Abdominal pain, right lower quadrant    Abnormal uterine bleeding    Allergic rhinitis due to other allergen    Allergy 06/28/2007  Anemia    Anxiety 05/2012  Arthritis 09/2015  Bipolar disorder, unspecified (CMS-HCC) 08/01/2009  Clotting disorder (CMS-HCC) 2005  Depression    Dysmenorrhea    Endometriosis of uterus 10/12/2010  Excessive or frequent menstruation    Fibroid [id number]  Follicular cyst of ovary    GERD (gastroesophageal reflux disease) 10/29/2016  Hepatitis C carrier (CMS-HCC) 09/08/2011  HIV infection (CMS-HCC)     Dx 2009 when pregnant  Human immunodeficiency virus (HIV) disease (CMS-HCC) 08/01/2009  Hypertension 2018  Migraine, unspecified, without mention of intractable migraine without mention of status migrainosus 07/14/2009  Pancreatitis    Unspecified contraceptive management        Patient Active Problem List Diagnosis  HIV -AIDS with opportunistic infection, Symptomatic (CMS-HCC)  Hepatitis C  infection  Contraception  Endometriosis  Migraine headache  Bipolar affective disorder (CMS-HCC)  Abnormal uterine bleeding (AUB)  Iron deficiency anemia due to chronic blood loss  Chronic endometritis  HTN, goal below 130/80  Pelvic pain in female  Migraine with aura and without status migrainosus, not intractable  Arthralgia  Tendinitis of wrist     Past Surgical History Past Surgical History: Procedure Laterality Date  ABDOMINAL SURGERY   1996 1998 2010 2018  CESAREAN DELIVERY      CESAREAN SECTION       x3  DILATION AND CURETTAGE OF UTERUS       x2 with TAB  ENDOMETRIAL ABLATION   2018  hand surgery Right    HERNIA REPAIR   09/2016  HERNIA REPAIR   05/04/2019   S. Sakai umbilical hernia  HYSTERECTOMY   07/11/2018   Total Lap Hysterectomy  TUBAL LIGATION   2018      Allergies Allergies Allergen Reactions  Naprosyn [Naproxen] Hives      Current Outpatient Medications on File Prior to Visit Medication Sig Dispense Refill  fluticasone propionate (FLONASE) 50 mcg/actuation nasal spray        QUEtiapine (SEROQUEL) 50 MG tablet        abacavir-dolutegravir-lamiVUDine (TRIUMEQ) 600-50-300 mg tablet Take 1 tablet by mouth once daily    (Patient not taking: Reported on 02/06/2020  )      albuterol 90 mcg/actuation inhaler Inhale 2 inhalations into the lungs every 6 (six) hours as needed for Wheezing 1 each 2  benzonatate (TESSALON) 100 MG  capsule TAKE 1 CAPSULE ORALLY 3 TIMES PER DAY AS NEEDED      buPROPion (WELLBUTRIN) 75 MG tablet Take 1 tablet (75 mg total) by mouth 2 (two) times daily (Patient taking differently: Take 150 mg by mouth once daily   ) 60 tablet 11  diclofenac (VOLTAREN) 1 % topical gel Apply 2 g topically 4 (four) times daily (Patient not taking: Reported on 05/14/2020  ) 200 g 3  DOVATO 50-300 mg Tab Take 1 tablet by mouth once daily      EMGALITY SYRINGE 120 mg/mL Syrg INJECT 1 SYRINGE '120MG'$  UNDER THE SKIN EVERY 28 DAYS (Patient not taking: Reported on  02/06/2020) 1 mL 3  glycopyrronium tosylate (QBREXZA) 2.4 % towelette Apply 1 Application topically once daily 30 each 2  hydroCHLOROthiazide (HYDRODIURIL) 25 MG tablet Take 1 tablet (25 mg total) by mouth once daily 30 tablet 2  hydrOXYzine pamoate (VISTARIL) 25 MG capsule TAKE 1 CAPSULE BY MOUTH 3 TIMES A DAY AS NEEDED FOR ANXIETY, IRRITABILITY      levocetirizine (XYZAL) 5 MG tablet Take 5 mg by mouth every evening      methylPREDNISolone (MEDROL) 4 MG tablet Day one - 6 tab Day two - 5 tab Day three - 4 tabs Day four - 3 tabs Day five - 2 tabs Day six - 1 tab (Patient not taking: Reported on 02/06/2020  ) 21 tablet 0  pregabalin (LYRICA) 75 MG capsule Take 75 mg by mouth 2 (two) times daily      sertraline (ZOLOFT) 100 MG tablet Take 1 tablet (100 mg total) by mouth once daily 30 tablet 11  SUMAtriptan (IMITREX) 100 MG tablet Take 1 tablet (100 mg total) by mouth as directed for Migraine May take a second dose after 2 hours if needed. (Patient not taking: Reported on 05/14/2020  ) 20 tablet 1  venlafaxine (EFFEXOR) 37.5 MG tablet TAKE 1 TABLET(37.5 MG) BY MOUTH TWICE DAILY 30 tablet 0  verapamiL (CALAN-SR) 240 MG SR tablet Take 1 tablet (240 mg total) by mouth nightly 30 tablet 2   No current facility-administered medications on file prior to visit.     Family History Family History Problem Relation Age of Onset  High blood pressure (Hypertension) Mother    Diabetes type II Mother    Coronary Artery Disease (Blocked arteries around heart) Mother    Arthritis Mother    Lung cancer Father    Lung cancer Maternal Grandmother    Arthritis Maternal Grandmother    Stomach cancer Paternal Grandmother    Bipolar disorder Maternal Aunt    Arthritis Maternal Aunt    Arthritis Maternal Uncle    Breast cancer Neg Hx    Cervical cancer Neg Hx    Colon cancer Neg Hx    Uterine cancer Neg Hx    Ovarian cancer Neg Hx    Clotting disorder Neg Hx        Social History   Tobacco Use Smoking  Status Former Smoker  Packs/day: 0.50  Years: 20.00  Pack years: 10.00  Types: Cigarettes  Start date: 09/30/1991  Quit date: 09/29/2016  Years since quitting: 4.1 Smokeless Tobacco Never Used Tobacco Comment   Patient smokes around 2 cigarettes a day.     Social History Social History    Socioeconomic History  Marital status: Single  Number of children: 3  Highest education level: Some college, no degree Tobacco Use  Smoking status: Former Smoker     Packs/day: 0.50  Years: 20.00     Pack years: 10.00     Types: Cigarettes     Start date: 09/30/1991     Quit date: 09/29/2016     Years since quitting: 4.1  Smokeless tobacco: Never Used  Tobacco comment: Patient smokes around 2 cigarettes a day. Vaping Use  Vaping Use: Never used Substance and Sexual Activity  Alcohol use: Not Currently     Comment: socially   Drug use: Not Currently     Types: Marijuana     Comment: THC x5 joints per week  Sexual activity: Not Currently     Partners: Male     Birth control/protection: Surgical, Other-see comments     Comment: hysterectomy tubal ligation    Social Determinants of Health    Financial Resource Strain: Medium Risk  Difficulty of Paying Living Expenses: Somewhat hard Food Insecurity: Landscape architect Present  Worried About Charity fundraiser in the Last Year: Sometimes true  Arboriculturist in the Last Year: Sometimes true Transportation Needs: Unmet Transportation Needs  Lack of Transportation (Medical): Yes  Lack of Transportation (Non-Medical): Yes Physical Activity: Inactive  Days of Exercise per Week: 0 days  Minutes of Exercise per Session: 0 min Stress: Stress Concern Present  Feeling of Stress : Very much Social Connections: Moderately Isolated  Frequency of Communication with Friends and Family: More than three times a week  Frequency of Social Gatherings with Friends and Family: Never  Attends Religious Services: Never  Marine scientist or  Organizations: No  Attends Music therapist: Never  Marital Status: Married Housing Stability: Low Risk   Unable to Pay for Housing in the Last Year: No  Number of [place] in the Last Year: 2  Unstable Housing in the Last Year: No      Objective:     There were no vitals filed for this visit.  There is no height or weight on file to calculate BMI.   Physical Exam Constitutional:      General: She is not in acute distress. HENT:     Head: Normocephalic and atraumatic.  Eyes:     General: No scleral icterus.    Conjunctiva/sclera: Conjunctivae normal.  Pulmonary:     Effort: Pulmonary effort is normal. No respiratory distress.  Abdominal:     General: There is no distension.     Palpations: Abdomen is soft.     Comments: Well-healed lower midline scar, well-healed right subcostal scar with midline extension. There is a hernia defect palpable in the epigastric area at the midline portion of the subcostal incision. It is reducible.   Musculoskeletal:        General: Normal range of motion.     Cervical back: Normal range of motion.  Skin:    General: Skin is warm and dry.  Neurological:     General: No focal deficit present.     Mental Status: She is alert and oriented to person, place, and time.  Psychiatric:        Mood and Affect: Mood normal.        Behavior: Behavior normal.        Thought Content: Thought content normal.              Assessment and Plan: Diagnoses and all orders for this visit:   Ventral incisional hernia -     Abdominal Binder       50 yo female with a history of open  hepatic wedge resection. Now with an epigastric incisional hernia. I discussed options including repair vs expectant management. Since she is having pain she would like to proceed with repair of the hernia. I discussed that I do believe some of her pain is related to the hernia, but some of her pain was present before the hernia developed so I do not expect  hernia repair while entirely resolve her symptoms. She expressed understanding and agrees to proceed with open hernia repair with mesh placement. Will likely require some adhesiolysis. I will plan to observe her overnight in the hospital for pain management. She will be contacted to schedule a surgery date.   Michaelle Birks, [facility], Hepatobiliary and Pancreatic Surgery 11/14/20 4:42 PM

## 2020-12-01 NOTE — Patient Instructions (Addendum)
DUE TO COVID-19 ONLY ONE VISITOR IS ALLOWED TO COME WITH YOU AND STAY IN THE WAITING ROOM ONLY DURING PRE OP AND PROCEDURE DAY OF SURGERY. THE 1 VISITOR  MAY VISIT WITH YOU AFTER SURGERY IN YOUR PRIVATE ROOM DURING VISITING HOURS ONLY!  YOU NEED TO HAVE A COVID 19 TEST ON__8/16_____ '@_______'$ , THIS TEST MUST BE DONE BEFORE SURGERY,                [facility]MAG[facility]     Your procedure is scheduled on: 12/11/20   Report to [place]  Entrance   Report to short stay at 5:15 AM     Call this number if you have problems the morning of surgery [place], NO CHEWING GUM [place].   No solid food after midnight. You may have clear liquids until 4:30 am   Take these medicines the morning of surgery with A SIP OF WATER: Lyrica, Wellbutrin, Zoloft, your anti viral Dovato                 Bring your mask and tubing. Use your inhaler and bring it with you.                               You may not have any metal on your body including hair pins and              piercings  Do not wear jewelry, make-up, lotions, powders or perfumes, deodorant             Do not wear nail polish on your fingernails.  Do not shave  48 hours prior to surgery.                Do not bring valuables to the hospital. [place].  Contacts, dentures or bridgework may not be worn into surgery.        Special Instructions: N/A              Please read over the following fact sheets you were given: _____________________________________________________________________             [facility] - Preparing for Surgery Before surgery, you can play an important role.  Because skin is not sterile, your skin needs to be as free of germs as possible.  You can reduce the number of germs on your skin by washing with CHG (chlorahexidine gluconate) soap before surgery.  CHG is an antiseptic cleaner  which kills germs and bonds with the skin to continue killing germs even after washing. Please DO NOT use if you have an allergy to CHG or antibacterial soaps.  If your skin becomes reddened/irritated stop using the CHG and inform your nurse when you arrive at Short Stay. Do not shave (including legs and underarms) for at least 48 hours prior to the first CHG shower.   Please follow these instructions carefully:  1.  Shower with CHG Soap the night before surgery and the  morning of Surgery.  2.  If you choose to wash your hair, wash your hair first as usual with your  normal  shampoo.  3.  After you shampoo, rinse your hair and body thoroughly to remove the  shampoo.  4.  Use CHG as you would any other liquid soap.  You can apply chg directly  to the skin and wash                       Gently with a scrungie or clean washcloth.  5.  Apply the CHG Soap to your body ONLY FROM THE NECK DOWN.   Do not use on face/ open                           Wound or open sores. Avoid contact with eyes, ears mouth and genitals (private parts).                       Wash face,  Genitals (private parts) with your normal soap.             6.  Wash thoroughly, paying special attention to the area where your surgery  will be performed.  7.  Thoroughly rinse your body with warm water from the neck down.  8.  DO NOT shower/wash with your normal soap after using and rinsing off  the CHG Soap.             9.  Pat yourself dry with a clean towel.            10.  Wear clean pajamas.            11.  Place clean sheets on your bed the night of your first shower and do not  sleep with pets. Day of Surgery : Do not apply any lotions/deodorants the morning of surgery.  Please wear clean clothes to the hospital/surgery center.  FAILURE TO FOLLOW THESE INSTRUCTIONS MAY RESULT IN THE CANCELLATION OF YOUR SURGERY PATIENT SIGNATURE_________________________________  NURSE  SIGNATURE__________________________________  ________________________________________________________________________

## 2020-12-02 ENCOUNTER — Other Ambulatory Visit: Payer: Self-pay

## 2020-12-02 ENCOUNTER — Encounter (HOSPITAL_COMMUNITY): Payer: Self-pay

## 2020-12-02 ENCOUNTER — Encounter (HOSPITAL_COMMUNITY)
Admission: RE | Admit: 2020-12-02 | Discharge: 2020-12-02 | Disposition: A | Discharge status: Home / Self Care | Payer: Medicaid Other | Source: Ambulatory Visit | Attending: Surgery | Admitting: Surgery

## 2020-12-02 ENCOUNTER — Other Ambulatory Visit: Payer: Self-pay | Admitting: Infectious Disease

## 2020-12-02 ENCOUNTER — Ambulatory Visit: Payer: Medicaid Other | Admitting: Neurology

## 2020-12-02 DIAGNOSIS — Z01818 Encounter for other preprocedural examination: Secondary | ICD-10-CM | POA: Diagnosis not present

## 2020-12-02 DIAGNOSIS — B20 Human immunodeficiency virus [HIV] disease: Secondary | ICD-10-CM

## 2020-12-02 HISTORY — DX: Myoneural disorder, unspecified: G70.9

## 2020-12-02 HISTORY — DX: Dyspnea, unspecified: R06.00

## 2020-12-02 LAB — CBC
HCT: 42.8 % (ref 36.0–46.0)
Hemoglobin: 13.6 g/dL (ref 12.0–15.0)
MCH: 25.8 pg — ABNORMAL LOW (ref 26.0–34.0)
MCHC: 31.8 g/dL (ref 30.0–36.0)
MCV: 81.2 fL (ref 80.0–100.0)
Platelets: 164 10*3/uL (ref 150–400)
RBC: 5.27 MIL/uL — ABNORMAL HIGH (ref 3.87–5.11)
RDW: 15.2 % (ref 11.5–15.5)
WBC: 4.8 10*3/uL (ref 4.0–10.5)
nRBC: 0 % (ref 0.0–0.2)

## 2020-12-02 LAB — COMPREHENSIVE METABOLIC PANEL
ALT: 19 U/L (ref 0–44)
AST: 21 U/L (ref 15–41)
Albumin: 4.2 g/dL (ref 3.5–5.0)
Alkaline Phosphatase: 68 U/L (ref 38–126)
Anion gap: 6 (ref 5–15)
BUN: 16 mg/dL (ref 6–20)
CO2: 29 mmol/L (ref 22–32)
Calcium: 9.5 mg/dL (ref 8.9–10.3)
Chloride: 103 mmol/L (ref 98–111)
Creatinine, Ser: 0.8 mg/dL (ref 0.44–1.00)
GFR, Estimated: >60 mL/min (ref >60)
Glucose, Bld: 105 mg/dL — ABNORMAL HIGH (ref 70–99)
Potassium: 4.1 mmol/L (ref 3.5–5.1)
Sodium: 138 mmol/L (ref 135–145)
Total Bilirubin: 0.4 mg/dL (ref 0.3–1.2)
Total Protein: 8.1 g/dL (ref 6.5–8.1)

## 2020-12-02 NOTE — Progress Notes (Signed)
COVID Vaccine Completed:Yes Date COVID Vaccine completed:01/08/20 Booster 08/05/20 COVID vaccine manufacturer:  Moderna  PCP - Dr. Milon Score Cardiologist - none  Chest x-ray - no EKG - 12/02/20-chart Stress Test - no ECHO - no Cardiac Cath - no Pacemaker/ICD device last checked:no  Sleep Study - yes CPAP - yes  Fasting Blood Sugar - NA Checks Blood Sugar _____ times a day  Blood Thinner Instructions:NA Aspirin Instructions: Last Dose:  Anesthesia review: yes  Patient denies shortness of breath, fever, cough and chest pain at PAT appointment Pt reports SOB with exertion and it has been getting worse over the last few months. She uses an inhaler daily and is getting use to a new C-pap machine. Anesthesia PA was called to talk with Pt.  Patient verbalized understanding of instructions that were given to them at the PAT appointment. Patient was also instructed that they will need to review over the PAT instructions again at home before surgery. yes

## 2020-12-04 ENCOUNTER — Other Ambulatory Visit: Payer: Self-pay | Admitting: Surgery

## 2020-12-05 NOTE — Progress Notes (Signed)
Anesthesia Chart Review   Case: [id number] Date/Time: 12/11/20 0715   Procedure: VENTRAL/INCISIONAL HERNIA REPAIR WITH MESH   Anesthesia type: General   Pre-op diagnosis: INCISIONAL HERNIA   Location: [facility] 04 / [facility]   Surgeons: [name], MD       DISCUSSION:49 y.o. former smoker with h/o HIV, GERD, HTN, sleep apnea, reactive airway disease, hepatocellular carcinoma s/p open wedge resection of liver lesion, incisional hernia scheduled for above procedure 12/11/2020 Michaelle Birks, MD.   Pt complains of shortness of breath with exertion, using albuterol inhaler several times per week.  This problem is chronic. She has been following with allergy for this problem. Last seen 09/08/2020. Per OV note restriction of spirometry. Started on daily Flovent. Advised to use inhaler DOS.   VS: BP 139/75   Pulse 76   Temp 36.6 C (Oral)   Resp 20   Ht '5\' 7"'$  (1.702 m)   Wt 99.8 kg   LMP 07/10/2018 (Exact Date)   SpO2 100%   BMI 34.46 kg/m   PROVIDERS: McVey, Gelene Mink, PA-C is PCP   Rexene Alberts, DO with Allergy LABS: Labs reviewed: Acceptable for surgery. (all labs ordered are listed, but only abnormal results are displayed)  Labs Reviewed  COMPREHENSIVE METABOLIC PANEL - Abnormal; Notable for the following components:      Result Value   Glucose, Bld 105 (*)    All other components within normal limits  CBC - Abnormal; Notable for the following components:   RBC 5.27 (*)    MCH 25.8 (*)    All other components within normal limits     IMAGES:   EKG: 12/02/2020 Rate 70 bpm  NSR  Minimal voltage criteria for LVH, may be normal variant Nonspecific T wave abnormality  Prolonged QT  CV:  Past Medical History:  Diagnosis Date   Abdominal pain 06/20/2020   Anemia 2020   iron infusions x 2   Anxiety    Arthritis    Neck, left shoulder   Asthma    uses inhaler daily   Bipolar disorder ([place])    Depression    Dyspnea    with exertion   Eczema    GERD  (gastroesophageal reflux disease)    Headache    Migraines  botox treatments   Hepatitis 2009   Hep C had treatment   HIV infection ([place])    Hypertension 2017   Neuromuscular disorder ([place])    pinch nerve, rt back   Ovarian cyst 03/24/2020   Pancreatitis    Pruritus 03/11/2020   Rash 09/18/2019   Seasonal allergies    Sleep apnea 08/2020   with C-Pap   SVD (spontaneous vaginal delivery)    x 1    Past Surgical History:  Procedure Laterality Date   ABDOMINAL HYSTERECTOMY     CESAREAN SECTION     x2   DILATION AND CURETTAGE OF UTERUS     x 2 - TAB   HAND SURGERY Right    2 pins in wrist   HYSTEROSCOPY N/A 10/11/2016   Procedure: HYSTEROSCOPY WITH HYDROTHERMAL ABLATION;  Surgeon: Donnamae Jude, MD;  Location: [facility];  Service: Gynecology;  Laterality: N/A;  Caryl Pina the [employer]TA rep will be here.  Confirmed on 09/02/16.    LAPAROSCOPIC BILATERAL SALPINGECTOMY Bilateral 07/11/2018   Procedure: LAPAROSCOPIC BILATERAL SALPINGECTOMY;  Surgeon: Ward, Honor Loh, MD;  Location: [facility];  Service: Gynecology;  Laterality: Bilateral;   LAPAROSCOPIC HYSTERECTOMY N/A 07/11/2018   Procedure: HYSTERECTOMY  TOTAL LAPAROSCOPIC;  Surgeon: Ward, Honor Loh, MD;  Location: [facility];  Service: Gynecology;  Laterality: N/A;   LAPAROSCOPIC TUBAL LIGATION Bilateral 10/11/2016   Procedure: LAPAROSCOPIC TUBAL LIGATION WITH FILSHIE CLIPS;  Surgeon: Donnamae Jude, MD;  Location: [facility];  Service: Gynecology;  Laterality: Bilateral;   LAPAROSCOPY N/A 04/30/2020   Procedure: LAPAROSCOPY DIAGNOSTIC;  Surgeon: [name], MD;  Location: [facility];  Service: General;  Laterality: N/A;   MOUTH SURGERY     OPEN PARTIAL HEPATECTOMY  Right 04/30/2020   Procedure: OPEN PARTIAL RIGHT HEPATECTOMY;  Surgeon: [name], MD;  Location: [place];  Service: General;  Laterality: Right;   ROBOTIC ASSISTED LAPAROSCOPIC OVARIAN CYSTECTOMY Left 05/04/2019   Procedure: XI ROBOTIC ASSISTED LAPAROSCOPIC OVARIAN CYSTECTOMY;   Surgeon: Ward, Honor Loh, MD;  Location: [facility];  Service: Gynecology;  Laterality: Left;   tumor biopsy   jan 5th 2022   UMBILICAL HERNIA REPAIR N/A 07/11/2018   Procedure: LAPAROSCOPIC UMBILICAL HERNIA REPAIR;  Surgeon: Benjamine Sprague, DO;  Location: [facility];  Service: General;  Laterality: N/A;   WISDOM TOOTH EXTRACTION      MEDICATIONS:  albuterol (PROAIR HFA) 108 (90 Base) MCG/ACT inhaler   azelastine (ASTELIN) 0.1 % nasal spray   buPROPion (WELLBUTRIN XL) 150 MG 24 hr tablet   diclofenac Sodium (VOLTAREN) 1 % GEL   docusate sodium (COLACE) 100 MG capsule   DOVATO 50-300 MG TABS   fluticasone (FLOVENT HFA) 110 MCG/ACT inhaler   hydrochlorothiazide (HYDRODIURIL) 25 MG tablet   hydrOXYzine (ATARAX/VISTARIL) 25 MG tablet   levocetirizine (XYZAL) 5 MG tablet   NURTEC 75 MG TBDP   oxyCODONE (OXY IR/ROXICODONE) 5 MG immediate release tablet   pregabalin (LYRICA) 75 MG capsule   QUEtiapine (SEROQUEL) 50 MG tablet   sertraline (ZOLOFT) 100 MG tablet   traZODone (DESYREL) 50 MG tablet   triamcinolone ointment (KENALOG) 0.5 %   No current facility-administered medications for this encounter.     Konrad Felix, PA-C [facility] [phone]

## 2020-12-09 ENCOUNTER — Telehealth: Payer: Self-pay

## 2020-12-09 DIAGNOSIS — G43109 Migraine with aura, not intractable, without status migrainosus: Secondary | ICD-10-CM

## 2020-12-09 MED ORDER — PREDNISONE 10 MG (21) PO TBPK: ORAL_TABLET | ORAL | 0 refills | Status: DC

## 2020-12-09 NOTE — Telephone Encounter (Signed)
Per Dr.Jaffe, Please send in a prednisone taper.  NSAIDs should not be taken while on the prednisone.    Script sent to the [employer].

## 2020-12-10 ENCOUNTER — Ambulatory Visit: Payer: Medicaid Other | Admitting: Allergy

## 2020-12-10 NOTE — Anesthesia Preprocedure Evaluation (Addendum)
Anesthesia Evaluation  Patient identified by MRN, date of birth, ID band Patient awake    Reviewed: Allergy & Precautions, H&P , NPO status , Patient's Chart, lab work & pertinent test results  Airway Mallampati: II  TM Distance: >3 FB Neck ROM: Full    Dental no notable dental hx. (+) Teeth Intact, Dental Advisory Given   Pulmonary sleep apnea and Continuous Positive Airway Pressure Ventilation , former smoker,    Pulmonary exam normal breath sounds clear to auscultation       Cardiovascular Exercise Tolerance: Good hypertension, Pt. on medications Normal cardiovascular exam Rhythm:Regular Rate:Normal     Neuro/Psych  Headaches, PSYCHIATRIC DISORDERS Anxiety Depression Bipolar Disorder  Neuromuscular disease    GI/Hepatic negative GI ROS, GERD  Medicated and Controlled,(+) Hepatitis -, C  Endo/Other  negative endocrine ROS  Renal/GU negative Renal ROS  negative genitourinary   Musculoskeletal  (+) Arthritis , Osteoarthritis,    Abdominal   Peds negative pediatric ROS (+)  Hematology  (+) Blood dyscrasia, anemia , HIV,   Anesthesia Other Findings   Reproductive/Obstetrics negative OB ROS                            Anesthesia Physical Anesthesia Plan  ASA: 3  Anesthesia Plan: General   Post-op Pain Management:    Induction: Intravenous  PONV Risk Score and Plan: 3 and Ondansetron, Dexamethasone and Treatment may vary due to age or medical condition  Airway Management Planned: Oral ETT and LMA  Additional Equipment: None  Intra-op Plan:   Post-operative Plan: Extubation in OR  Informed Consent: I have reviewed the patients History and Physical, chart, labs and discussed the procedure including the risks, benefits and alternatives for the proposed anesthesia with the patient or authorized representative who has indicated his/her understanding and acceptance.       Plan  Discussed with: Anesthesiologist and CRNA  Anesthesia Plan Comments: (  )        Anesthesia Quick Evaluation

## 2020-12-11 ENCOUNTER — Ambulatory Visit (HOSPITAL_COMMUNITY): Payer: Medicaid Other | Admitting: Physician Assistant

## 2020-12-11 ENCOUNTER — Encounter (HOSPITAL_COMMUNITY): Payer: Self-pay | Admitting: Surgery

## 2020-12-11 ENCOUNTER — Encounter (HOSPITAL_COMMUNITY)
Admission: RE | Disposition: A | Discharge status: Home / Self Care | Payer: Self-pay | Source: Ambulatory Visit | Attending: Surgery

## 2020-12-11 ENCOUNTER — Ambulatory Visit (HOSPITAL_COMMUNITY)
Admission: RE | Admit: 2020-12-11 | Discharge: 2020-12-11 | Disposition: A | Discharge status: Home / Self Care | Payer: Medicaid Other | Source: Ambulatory Visit | Attending: Surgery | Admitting: Surgery

## 2020-12-11 ENCOUNTER — Ambulatory Visit (HOSPITAL_COMMUNITY): Payer: Medicaid Other | Admitting: Certified Registered Nurse Anesthetist

## 2020-12-11 DIAGNOSIS — I10 Essential (primary) hypertension: Secondary | ICD-10-CM | POA: Insufficient documentation

## 2020-12-11 DIAGNOSIS — Z87891 Personal history of nicotine dependence: Secondary | ICD-10-CM | POA: Diagnosis not present

## 2020-12-11 DIAGNOSIS — Z833 Family history of diabetes mellitus: Secondary | ICD-10-CM | POA: Insufficient documentation

## 2020-12-11 DIAGNOSIS — G8918 Other acute postprocedural pain: Secondary | ICD-10-CM | POA: Diagnosis not present

## 2020-12-11 DIAGNOSIS — Z20822 Contact with and (suspected) exposure to covid-19: Secondary | ICD-10-CM | POA: Insufficient documentation

## 2020-12-11 DIAGNOSIS — Z8261 Family history of arthritis: Secondary | ICD-10-CM | POA: Insufficient documentation

## 2020-12-11 DIAGNOSIS — Z8249 Family history of ischemic heart disease and other diseases of the circulatory system: Secondary | ICD-10-CM | POA: Diagnosis not present

## 2020-12-11 DIAGNOSIS — Z79899 Other long term (current) drug therapy: Secondary | ICD-10-CM | POA: Diagnosis not present

## 2020-12-11 DIAGNOSIS — K660 Peritoneal adhesions (postprocedural) (postinfection): Secondary | ICD-10-CM | POA: Diagnosis not present

## 2020-12-11 DIAGNOSIS — Z801 Family history of malignant neoplasm of trachea, bronchus and lung: Secondary | ICD-10-CM | POA: Diagnosis not present

## 2020-12-11 DIAGNOSIS — Z800 Family history of malignant neoplasm of digestive organs: Secondary | ICD-10-CM | POA: Insufficient documentation

## 2020-12-11 DIAGNOSIS — R109 Unspecified abdominal pain: Secondary | ICD-10-CM | POA: Diagnosis present

## 2020-12-11 DIAGNOSIS — Z886 Allergy status to analgesic agent status: Secondary | ICD-10-CM | POA: Diagnosis not present

## 2020-12-11 HISTORY — PX: VENTRAL HERNIA REPAIR: SHX424

## 2020-12-11 LAB — SARS CORONAVIRUS 2 BY RT PCR (HOSPITAL ORDER, PERFORMED IN ~~LOC~~ HOSPITAL LAB): SARS Coronavirus 2: NEGATIVE

## 2020-12-11 SURGERY — REPAIR, HERNIA, VENTRAL
Anesthesia: General | Site: Abdomen

## 2020-12-11 MED ORDER — FENTANYL CITRATE (PF) 250 MCG/5ML IJ SOLN
INTRAMUSCULAR | Status: AC
  Filled 2020-12-11: qty 5

## 2020-12-11 MED ORDER — ROCURONIUM BROMIDE 10 MG/ML (PF) SYRINGE
PREFILLED_SYRINGE | INTRAVENOUS | Status: AC
  Filled 2020-12-11: qty 10

## 2020-12-11 MED ORDER — OXYCODONE HCL 5 MG PO TABS
5.0000 mg | ORAL_TABLET | Freq: Once | ORAL | Status: AC | PRN
  Administered 2020-12-11: 5 mg via ORAL

## 2020-12-11 MED ORDER — HEPARIN SODIUM (PORCINE) 5000 UNIT/ML IJ SOLN
5000.0000 [IU] | Freq: Once | INTRAMUSCULAR | Status: AC
  Administered 2020-12-11: 5000 [IU] via SUBCUTANEOUS
  Filled 2020-12-11: qty 1

## 2020-12-11 MED ORDER — CEFAZOLIN SODIUM-DEXTROSE 2-4 GM/100ML-% IV SOLN
2.0000 g | INTRAVENOUS | Status: AC
  Administered 2020-12-11: 2 g via INTRAVENOUS
  Filled 2020-12-11: qty 100

## 2020-12-11 MED ORDER — ACETAMINOPHEN 160 MG/5ML PO SOLN: 325.0000 mg | ORAL | Status: DC | PRN

## 2020-12-11 MED ORDER — PROPOFOL 10 MG/ML IV BOLUS
INTRAVENOUS | Status: DC | PRN
  Administered 2020-12-11: 200 mg via INTRAVENOUS

## 2020-12-11 MED ORDER — DEXAMETHASONE SODIUM PHOSPHATE 4 MG/ML IJ SOLN
INTRAMUSCULAR | Status: DC | PRN
  Administered 2020-12-11: 10 mg via INTRAVENOUS

## 2020-12-11 MED ORDER — DEXAMETHASONE SODIUM PHOSPHATE 10 MG/ML IJ SOLN
INTRAMUSCULAR | Status: AC
  Filled 2020-12-11: qty 1

## 2020-12-11 MED ORDER — BUPIVACAINE-EPINEPHRINE (PF) 0.25% -1:200000 IJ SOLN
INTRAMUSCULAR | Status: AC
  Filled 2020-12-11: qty 30

## 2020-12-11 MED ORDER — ACETAMINOPHEN 325 MG PO TABS: 325.0000 mg | ORAL_TABLET | ORAL | Status: DC | PRN

## 2020-12-11 MED ORDER — LIDOCAINE 2% (20 MG/ML) 5 ML SYRINGE
INTRAMUSCULAR | Status: DC | PRN
  Administered 2020-12-11: 60 mg via INTRAVENOUS

## 2020-12-11 MED ORDER — KETAMINE HCL 10 MG/ML IJ SOLN
INTRAMUSCULAR | Status: DC | PRN
  Administered 2020-12-11: 30 mg via INTRAVENOUS

## 2020-12-11 MED ORDER — SUGAMMADEX SODIUM 200 MG/2ML IV SOLN
INTRAVENOUS | Status: DC | PRN
  Administered 2020-12-11: 200 mg via INTRAVENOUS
  Administered 2020-12-11: 300 mg via INTRAVENOUS

## 2020-12-11 MED ORDER — CHLORHEXIDINE GLUCONATE 0.12 % MT SOLN
15.0000 mL | Freq: Once | OROMUCOSAL | Status: AC
  Administered 2020-12-11: 15 mL via OROMUCOSAL

## 2020-12-11 MED ORDER — LIDOCAINE 2% (20 MG/ML) 5 ML SYRINGE
INTRAMUSCULAR | Status: AC
  Filled 2020-12-11: qty 5

## 2020-12-11 MED ORDER — FENTANYL CITRATE (PF) 100 MCG/2ML IJ SOLN
INTRAMUSCULAR | Status: DC | PRN
  Administered 2020-12-11: 100 ug via INTRAVENOUS
  Administered 2020-12-11: 50 ug via INTRAVENOUS
  Administered 2020-12-11: 100 ug via INTRAVENOUS

## 2020-12-11 MED ORDER — GLYCOPYRROLATE 0.2 MG/ML IJ SOLN
INTRAMUSCULAR | Status: AC
  Filled 2020-12-11: qty 1

## 2020-12-11 MED ORDER — ACETAMINOPHEN 500 MG PO TABS
1000.0000 mg | ORAL_TABLET | ORAL | Status: AC
  Administered 2020-12-11: 1000 mg via ORAL
  Filled 2020-12-11: qty 2

## 2020-12-11 MED ORDER — 0.9 % SODIUM CHLORIDE (POUR BTL) OPTIME
TOPICAL | Status: DC | PRN
  Administered 2020-12-11: 1000 mL

## 2020-12-11 MED ORDER — ONDANSETRON HCL 4 MG/2ML IJ SOLN: 4.0000 mg | Freq: Once | INTRAMUSCULAR | Status: DC | PRN

## 2020-12-11 MED ORDER — MIDAZOLAM HCL 2 MG/2ML IJ SOLN
INTRAMUSCULAR | Status: AC
  Filled 2020-12-11: qty 2

## 2020-12-11 MED ORDER — ROCURONIUM BROMIDE 10 MG/ML (PF) SYRINGE
PREFILLED_SYRINGE | INTRAVENOUS | Status: DC | PRN
  Administered 2020-12-11: 70 mg via INTRAVENOUS

## 2020-12-11 MED ORDER — ONDANSETRON HCL 4 MG/2ML IJ SOLN
INTRAMUSCULAR | Status: DC | PRN
  Administered 2020-12-11: 4 mg via INTRAVENOUS

## 2020-12-11 MED ORDER — OXYCODONE HCL 5 MG/5ML PO SOLN: 5.0000 mg | Freq: Once | ORAL | Status: AC | PRN

## 2020-12-11 MED ORDER — MIDAZOLAM HCL 5 MG/5ML IJ SOLN
INTRAMUSCULAR | Status: DC | PRN
  Administered 2020-12-11: 2 mg via INTRAVENOUS

## 2020-12-11 MED ORDER — ORAL CARE MOUTH RINSE: 15.0000 mL | Freq: Once | OROMUCOSAL | Status: AC

## 2020-12-11 MED ORDER — ONDANSETRON HCL 4 MG/2ML IJ SOLN
INTRAMUSCULAR | Status: AC
  Filled 2020-12-11: qty 2

## 2020-12-11 MED ORDER — OXYCODONE HCL 5 MG PO TABS
ORAL_TABLET | ORAL | Status: AC
  Filled 2020-12-11: qty 1

## 2020-12-11 MED ORDER — GABAPENTIN 300 MG PO CAPS
300.0000 mg | ORAL_CAPSULE | ORAL | Status: AC
  Administered 2020-12-11: 300 mg via ORAL
  Filled 2020-12-11: qty 1

## 2020-12-11 MED ORDER — MEPERIDINE HCL 50 MG/ML IJ SOLN: 6.2500 mg | INTRAMUSCULAR | Status: DC | PRN

## 2020-12-11 MED ORDER — FENTANYL CITRATE (PF) 100 MCG/2ML IJ SOLN: 25.0000 ug | INTRAMUSCULAR | Status: DC | PRN

## 2020-12-11 MED ORDER — KETAMINE HCL 10 MG/ML IJ SOLN
INTRAMUSCULAR | Status: AC
  Filled 2020-12-11: qty 1

## 2020-12-11 MED ORDER — PROPOFOL 500 MG/50ML IV EMUL
INTRAVENOUS | Status: AC
  Filled 2020-12-11: qty 50

## 2020-12-11 MED ORDER — OXYCODONE HCL 5 MG PO TABS: 5.0000 mg | ORAL_TABLET | Freq: Four times a day (QID) | ORAL | 0 refills | Status: AC | PRN

## 2020-12-11 MED ORDER — LACTATED RINGERS IV SOLN: INTRAVENOUS | Status: DC

## 2020-12-11 MED ORDER — BUPIVACAINE-EPINEPHRINE 0.25% -1:200000 IJ SOLN
INTRAMUSCULAR | Status: DC | PRN
  Administered 2020-12-11: 30 mL

## 2020-12-11 SURGICAL SUPPLY — 40 items
BAG COUNTER SPONGE SURGICOUNT (BAG) IMPLANT
BINDER ABDOMINAL 12 ML 46-62 (SOFTGOODS) ×2 IMPLANT
CHLORAPREP W/TINT 26 (MISCELLANEOUS) ×2 IMPLANT
COVER SURGICAL LIGHT HANDLE (MISCELLANEOUS) ×2 IMPLANT
DECANTER SPIKE VIAL GLASS SM (MISCELLANEOUS) ×2 IMPLANT
DERMABOND ADVANCED (GAUZE/BANDAGES/DRESSINGS) ×1
DERMABOND ADVANCED .7 DNX12 (GAUZE/BANDAGES/DRESSINGS) ×1 IMPLANT
DRAIN CHANNEL 19F RND (DRAIN) IMPLANT
DRAPE LAPAROSCOPIC ABDOMINAL (DRAPES) ×2 IMPLANT
ELECT REM PT RETURN 15FT ADLT (MISCELLANEOUS) ×2 IMPLANT
EVACUATOR SILICONE 100CC (DRAIN) IMPLANT
GAUZE SPONGE 4X4 12PLY STRL (GAUZE/BANDAGES/DRESSINGS) IMPLANT
GLOVE SURG ENC MOIS LTX SZ6 (GLOVE) ×2 IMPLANT
GLOVE SURG POLYISO LF SZ6 (GLOVE) ×2 IMPLANT
GOWN STRL REUS W/TWL LRG LVL3 (GOWN DISPOSABLE) ×2 IMPLANT
GOWN STRL REUS W/TWL XL LVL3 (GOWN DISPOSABLE) ×2 IMPLANT
IRRIG SUCT STRYKERFLOW 2 WTIP (MISCELLANEOUS) ×2
IRRIGATION SUCT STRKRFLW 2 WTP (MISCELLANEOUS) ×1 IMPLANT
KIT BASIN OR (CUSTOM PROCEDURE TRAY) ×2 IMPLANT
KIT TURNOVER KIT A (KITS) ×2 IMPLANT
NEEDLE HYPO 22GX1.5 SAFETY (NEEDLE) IMPLANT
NEEDLE INSUFFLATION 14GA 120MM (NEEDLE) ×2 IMPLANT
PACK GENERAL/GYN (CUSTOM PROCEDURE TRAY) ×2 IMPLANT
SCISSORS ENDO CVD 5DCS (MISCELLANEOUS) ×2 IMPLANT
SET TUBE SMOKE EVAC HIGH FLOW (TUBING) ×2 IMPLANT
SLEEVE XCEL OPT CAN 5 100 (ENDOMECHANICALS) ×2 IMPLANT
SPONGE DRAIN TRACH 4X4 STRL 2S (GAUZE/BANDAGES/DRESSINGS) IMPLANT
SUT ETHIBOND 0 MO6 C/R (SUTURE) IMPLANT
SUT ETHILON 2 0 PS N (SUTURE) IMPLANT
SUT MNCRL AB 4-0 PS2 18 (SUTURE) ×2 IMPLANT
SUT NOVA 1 T20/GS 25DT (SUTURE) IMPLANT
SUT PDS AB 1 CT1 27 (SUTURE) IMPLANT
SUT PROLENE 2 0 CT2 30 (SUTURE) IMPLANT
SUT VIC AB 3-0 SH 27 (SUTURE)
SUT VIC AB 3-0 SH 27XBRD (SUTURE) IMPLANT
SYR CONTROL 10ML LL (SYRINGE) IMPLANT
TOWEL OR 17X26 10 PK STRL BLUE (TOWEL DISPOSABLE) ×2 IMPLANT
TOWEL OR NON WOVEN STRL DISP B (DISPOSABLE) ×2 IMPLANT
TROCAR BLADELESS OPT 5 100 (ENDOMECHANICALS) ×2 IMPLANT
TROCAR XCEL NON-BLD 11X100MML (ENDOMECHANICALS) ×2 IMPLANT

## 2020-12-11 NOTE — Discharge Instructions (Addendum)
[facility] DISCHARGE INSTRUCTIONS  Activity Ok to shower, but do not bathe or submerge incisions underwater. Do not drive while taking narcotic pain medication. You may otherwise resume your regular activities as tolerated.  Wound Care Your incisions are covered with skin glue called Dermabond. This will peel off on its own over time. You may shower and allow warm soapy water to run over your incisions. Gently pat dry. Do not submerge your incision underwater. Monitor your incision for any new redness, tenderness, or drainage.  When to Call us: Fever greater than 100.5 New redness, drainage, or swelling at incision site Severe pain, nausea, or vomiting Jaundice (yellowing of the whites of the eyes or skin)  Follow-up You have an appointment scheduled with Dr. Zenia Resides on December 31, 2020 at 3:30pm. This will be at the [facility] office at [address] [place], [place], [place]. Please arrive at least 15 minutes prior to your scheduled appointment time.  For questions or concerns, please call the office at [phone].

## 2020-12-11 NOTE — Op Note (Signed)
Date: 12/11/20  Patient: Teresa [name] MRN: [id number]  Preoperative Diagnosis: Abdominal pain, possible incisional hernia Postoperative Diagnosis: Abdominal pain  Procedure: Diagnostic laparoscopy  Surgeon: Michaelle Birks, MD  EBL: Minimal  Anesthesia: General  Specimens: None  Indications: Teresa [name] is a 50 yo female who underwent an open hepatic wedge resection in January of this year for a LiRADS-5 liver lesion that was ultimately benign on final pathology. Postoperatively she has had persistent incisional pain. Previous workup and imaging has not shown any specific etiology. More recently she started to notice a bulge in the upper abdomen concerning for an incisional hernia. She agreed to proceed with diagnostic laparoscopy and possible hernia repair.  Findings: No hernia defect anywhere in the abdominal wall. Mesh remains in place at the umbilicus at the site of a previous hernia repair. No intraabdominal adhesions. Grossly normal appearance of the liver and visualized small bowel and colon. No abnormalities that correlate with patient's symptoms.  Procedure details: Informed consent was obtained in the preoperative area prior to the procedure. The patient was brought to the operating room and placed on the table in the supine position. General anesthesia was induced and appropriate lines and drains were placed for intraoperative monitoring. Perioperative antibiotics were administered per SCIP guidelines. The abdomen was prepped and draped in the usual sterile fashion. A pre-procedure timeout was taken verifying patient identity, surgical site and procedure to be performed.  A small incision was made at a previous scar in the LUQ at [place]. A Veress needle was inserted through the fascia and intraperitoneal placement was confirmed with the saline drop test. The abdomen was insufflated and a 64m port was placed. The abdomen was inspected with no evidence of visceral or vascular  injury. The previously mobilized falciform ligament flap was adherent to the abdominal wall at the upper midline. There were no adhesions to the lower midline scar, and the previously placed mesh was visible at the umbilicus. The mesh lay flat against the abdominal wall and appeared to be in good position with no adhesions. An additional 560mport was placed inferior to the umbilicus under direct visualization, taking care not to place the port through the mesh. The falciform ligament was sharply taken down off the abdominal wall, and there was no hernia at this point. The entire subcostal incision appeared in tact with no hernia defect. There were adhesions over the right hepatic lobe but the liver was otherwise grossly normal in appearance. The visualized colon and small bowel were normal. There were no abnormalities that correlated with the patient's symptoms. There was some oozing from the falciform ligament which was controlled with cautery. The ports were removed under direct visualization and the abdomen was desufflated. The port sites were closed with subcuticular 4-0 monocryl suture and dermabond was applied.  The patient tolerated the procedure well with no apparent complications. All counts were correct x2 at the end of the procedure. The patient was extubated and taken to PACU in stable condition.  ShMichaelle BirksMD 12/11/20 8:06 AM

## 2020-12-11 NOTE — Anesthesia Procedure Notes (Signed)
Procedure Name: Intubation Date/Time: 12/11/2020 7:21 AM Performed by: Claudia Desanctis, CRNA Pre-anesthesia Checklist: Patient identified, Emergency Drugs available, Suction available and Patient being monitored Patient Re-evaluated:Patient Re-evaluated prior to induction Oxygen Delivery Method: Circle system utilized Preoxygenation: Pre-oxygenation with 100% oxygen Induction Type: IV induction Ventilation: Mask ventilation without difficulty Laryngoscope Size: 2 and Miller Grade View: Grade I Tube type: Oral Tube size: 7.0 mm Number of attempts: 1 Airway Equipment and Method: Stylet Placement Confirmation: ETT inserted through vocal cords under direct vision, positive ETCO2 and breath sounds checked- equal and bilateral Secured at: 20 cm Tube secured with: Tape Dental Injury: Teeth and Oropharynx as per pre-operative assessment

## 2020-12-11 NOTE — Interval H&P Note (Signed)
History and Physical Interval Note:  12/11/2020 6:59 AM  Teresa [name]  has presented today for surgery, with the diagnosis of INCISIONAL HERNIA.  The various methods of treatment have been discussed with the patient and family. After consideration of risks, benefits and other options for treatment, the patient has consented to  Procedure(s): VENTRAL/INCISIONAL HERNIA REPAIR WITH MESH (N/A) as a surgical intervention.  Patient has still noticed a bulge in the upper abdomen, difficult to palpate on today's exam if this is a true hernia or just weakness in the abdominal wall. Will begin with diagnostic laparoscopy to confirm the presence of a hernia, and also rule out other causes of the patient's persistent pain. Surgical plan was discussed with the patient. The patient's history has been reviewed, patient examined, no change in status, stable for surgery.  I have reviewed the patient's chart and labs.  Questions were answered to the patient's satisfaction.     Teresa [name]

## 2020-12-11 NOTE — Anesthesia Postprocedure Evaluation (Signed)
Anesthesia Post Note  Patient: Teresa [name]  Procedure(s) Performed: DIAGNOSTIC LAPAROSCOPY, LYSIS OF ADHESIONS (Abdomen)     Patient location during evaluation: PACU Anesthesia Type: General Level of consciousness: awake and alert Pain management: pain level controlled Vital Signs Assessment: post-procedure vital signs reviewed and stable Respiratory status: spontaneous breathing, nonlabored ventilation, respiratory function stable and patient connected to nasal cannula oxygen Cardiovascular status: blood pressure returned to baseline and stable Postop Assessment: no apparent nausea or vomiting Anesthetic complications: no   No notable events documented.  Last Vitals:  Vitals:   12/11/20 1000 12/11/20 1015  BP: (!) 145/98 137/90  Pulse: 66 76  Resp: 18   Temp:    SpO2: 95% 93%    Last Pain:  Vitals:   12/11/20 1000  TempSrc:   PainSc: 2                  Chloe Bluett

## 2020-12-11 NOTE — Transfer of Care (Signed)
Immediate Anesthesia Transfer of Care Note  Patient: Teresa [name]  Procedure(s) Performed: DIAGNOSTIC LAPAROSCOPY, LYSIS OF ADHESIONS (Abdomen)  Patient Location: PACU  Anesthesia Type:General  Level of Consciousness: awake, drowsy and patient cooperative  Airway & Oxygen Therapy: Patient Spontanous Breathing and Patient connected to face mask  Post-op Assessment: Report given to RN and Post -op Vital signs reviewed and stable  Post vital signs: Reviewed and stable  Last Vitals:  Vitals Value Taken Time  BP 161/96 12/11/20 0820  Temp    Pulse 75 12/11/20 0822  Resp 18 12/11/20 0822  SpO2 91 % 12/11/20 0822  Vitals shown include unvalidated device data.  Last Pain:  Vitals:   12/11/20 0606  TempSrc: Oral  PainSc:          Complications: No notable events documented.

## 2020-12-12 ENCOUNTER — Encounter (HOSPITAL_COMMUNITY): Payer: Self-pay | Admitting: Surgery

## 2020-12-30 ENCOUNTER — Other Ambulatory Visit: Payer: Self-pay

## 2020-12-30 ENCOUNTER — Other Ambulatory Visit: Payer: Self-pay | Admitting: Infectious Disease

## 2020-12-30 DIAGNOSIS — B20 Human immunodeficiency virus [HIV] disease: Secondary | ICD-10-CM

## 2021-01-01 ENCOUNTER — Emergency Department
Admit: 2021-01-01 | Discharge: 2021-01-02 | Disposition: A | Discharge status: Home / Self Care | Payer: BLUE CROSS/BLUE SHIELD | Attending: Emergency Medicine

## 2021-01-01 ENCOUNTER — Ambulatory Visit
Admit: 2021-01-01 | Discharge: 2021-01-02 | Disposition: A | Discharge status: Home / Self Care | Payer: BLUE CROSS/BLUE SHIELD | Attending: Emergency Medicine

## 2021-01-01 DIAGNOSIS — R109 Unspecified abdominal pain: Principal | ICD-10-CM

## 2021-01-01 MED ORDER — LIDOCAINE 3.5 %-MENTHOL 7 % TOPICAL PATCH
MEDICATED_PATCH | Freq: Every day | TOPICAL | 0 refills | 0 days | Status: CP
Start: 2021-01-01 — End: ?

## 2021-01-09 ENCOUNTER — Other Ambulatory Visit: Payer: Self-pay

## 2021-01-09 ENCOUNTER — Ambulatory Visit: Payer: Medicaid Other | Admitting: Neurology

## 2021-01-09 DIAGNOSIS — G43709 Chronic migraine without aura, not intractable, without status migrainosus: Secondary | ICD-10-CM | POA: Diagnosis not present

## 2021-01-09 DIAGNOSIS — G43109 Migraine with aura, not intractable, without status migrainosus: Secondary | ICD-10-CM | POA: Diagnosis not present

## 2021-01-09 MED ORDER — ONABOTULINUMTOXINA 100 UNITS IJ SOLR
155.0000 [IU] | Freq: Once | INTRAMUSCULAR | Status: AC
  Administered 2021-01-09: 155 [IU] via INTRAMUSCULAR

## 2021-01-09 NOTE — Addendum Note (Signed)
Addended by: Venetia Night on: 01/09/2021 04:06 PM   Modules accepted: Orders

## 2021-01-09 NOTE — Progress Notes (Signed)
[facility]   Procedure Note Botox  Attending: Dr. Metta Clines  Preoperative Diagnosis(es): Chronic migraine  Consent obtained from: The patient Benefits discussed included, but were not limited to decreased muscle tightness, increased joint range of motion, and decreased pain.  Risk discussed included, but were not limited pain and discomfort, bleeding, bruising, excessive weakness, venous thrombosis, muscle atrophy and dysphagia.  Anticipated outcomes of the procedure as well as he risks and benefits of the alternatives to the procedure, and the roles and tasks of the personnel to be involved, were discussed with the patient, and the patient consents to the procedure and agrees to proceed. A copy of the patient medication guide was given to the patient which explains the blackbox warning.  Patients identity and treatment sites confirmed Yes.  .  Details of Procedure: Skin was cleaned with alcohol. Prior to injection, the needle plunger was aspirated to make sure the needle was not within a blood vessel.  There was no blood retrieved on aspiration.    Following is a summary of the muscles injected  And the amount of Botulinum toxin used:  Dilution 200 units of Botox was reconstituted with 4 ml of preservative free normal saline. Time of reconstitution: At the time of the office visit (<30 minutes prior to injection)   Injections  155 total units of Botox was injected with a 30 gauge needle.  Injection Sites: L occipitalis: 15 units- 3 sites  R occiptalis: 15 units- 3 sites  L upper trapezius: 15 units- 3 sites R upper trapezius: 15 units- 3 sits          L paraspinal: 10 units- 2 sites R paraspinal: 10 units- 2 sites  Face L frontalis(2 injection sites):10 units   R frontalis(2 injection sites):10 units         L corrugator: 5 units   R corrugator: 5 units           Procerus: 5 units   L temporalis: 20 units R temporalis: 20 units   Agent:  200 units of botulinum Type  A (Onobotulinum Toxin type A) was reconstituted with 4 ml of preservative free normal saline.  Time of reconstitution: At the time of the office visit (<30 minutes prior to injection)     Total injected (Units):  155  Total wasted (Units):  none wasted  Patient tolerated procedure well without complications.   Reinjection is anticipated in 3 months.

## 2021-01-22 ENCOUNTER — Ambulatory Visit: Payer: Medicaid Other | Admitting: Neurology

## 2021-01-24 ENCOUNTER — Other Ambulatory Visit: Payer: Self-pay | Admitting: Infectious Disease

## 2021-01-24 DIAGNOSIS — B20 Human immunodeficiency virus [HIV] disease: Secondary | ICD-10-CM

## 2021-01-26 NOTE — Telephone Encounter (Signed)
Patient scheduled for 01/30/2021.

## 2021-01-26 NOTE — Telephone Encounter (Signed)
Left VM asking patient to return my call. Overdue for office f/u.

## 2021-01-29 ENCOUNTER — Ambulatory Visit: Admit: 2021-01-29 | Discharge: 2021-01-30 | Payer: BLUE CROSS/BLUE SHIELD

## 2021-01-29 DIAGNOSIS — B182 Chronic viral hepatitis C: Principal | ICD-10-CM

## 2021-01-29 DIAGNOSIS — F419 Anxiety disorder, unspecified: Principal | ICD-10-CM

## 2021-01-29 DIAGNOSIS — N920 Excessive and frequent menstruation with regular cycle: Principal | ICD-10-CM

## 2021-01-29 DIAGNOSIS — Z21 Asymptomatic human immunodeficiency virus [HIV] infection status: Principal | ICD-10-CM

## 2021-01-29 DIAGNOSIS — J309 Allergic rhinitis, unspecified: Principal | ICD-10-CM

## 2021-01-29 DIAGNOSIS — I10 Essential (primary) hypertension: Principal | ICD-10-CM

## 2021-01-29 DIAGNOSIS — F319 Bipolar disorder, unspecified: Principal | ICD-10-CM

## 2021-01-29 DIAGNOSIS — Z7689 Persons encountering health services in other specified circumstances: Principal | ICD-10-CM

## 2021-01-29 DIAGNOSIS — Z9989 Dependence on other enabling machines and devices: Principal | ICD-10-CM

## 2021-01-29 DIAGNOSIS — G4733 Obstructive sleep apnea (adult) (pediatric): Principal | ICD-10-CM

## 2021-01-29 DIAGNOSIS — R300 Dysuria: Principal | ICD-10-CM

## 2021-01-29 DIAGNOSIS — H1010 Acute atopic conjunctivitis, unspecified eye: Principal | ICD-10-CM

## 2021-01-29 DIAGNOSIS — G894 Chronic pain syndrome: Principal | ICD-10-CM

## 2021-01-29 DIAGNOSIS — F329 Major depressive disorder, single episode, unspecified: Principal | ICD-10-CM

## 2021-01-29 DIAGNOSIS — G43909 Migraine, unspecified, not intractable, without status migrainosus: Principal | ICD-10-CM

## 2021-01-29 DIAGNOSIS — N898 Other specified noninflammatory disorders of vagina: Principal | ICD-10-CM

## 2021-01-30 ENCOUNTER — Ambulatory Visit: Payer: Medicaid Other | Admitting: Infectious Disease

## 2021-01-30 ENCOUNTER — Ambulatory Visit: Payer: Medicaid Other | Admitting: Neurology

## 2021-01-30 MED ORDER — METRONIDAZOLE 500 MG TABLET
ORAL_TABLET | Freq: Two times a day (BID) | ORAL | 0 refills | 7 days | Status: CP
Start: 2021-01-30 — End: 2021-02-06

## 2021-02-04 ENCOUNTER — Ambulatory Visit: Admit: 2021-02-04 | Discharge: 2021-02-05 | Payer: BLUE CROSS/BLUE SHIELD

## 2021-02-04 ENCOUNTER — Ambulatory Visit: Admit: 2021-02-04 | Discharge: 2021-02-05 | Payer: BLUE CROSS/BLUE SHIELD | Attending: Clinical | Primary: Clinical

## 2021-02-04 DIAGNOSIS — Z21 Asymptomatic human immunodeficiency virus [HIV] infection status: Principal | ICD-10-CM

## 2021-02-04 DIAGNOSIS — R21 Rash and other nonspecific skin eruption: Principal | ICD-10-CM

## 2021-02-04 DIAGNOSIS — Z23 Encounter for immunization: Principal | ICD-10-CM

## 2021-02-04 DIAGNOSIS — B182 Chronic viral hepatitis C: Principal | ICD-10-CM

## 2021-02-04 DIAGNOSIS — L309 Dermatitis, unspecified: Principal | ICD-10-CM

## 2021-02-09 DIAGNOSIS — Z21 Asymptomatic human immunodeficiency virus [HIV] infection status: Principal | ICD-10-CM

## 2021-02-09 MED ORDER — CABOTEGRAVIR ER 600 MG/3 ML-RILPIVIRINE ER 900 MG/3ML IM SUSPENSION,ER
INTRAMUSCULAR | 0 refills | 56.00000 days | Status: CP
Start: 2021-02-09 — End: 2021-04-10
  Filled 2021-02-10: qty 6, 30d supply, fill #0

## 2021-02-09 NOTE — Unmapped (Signed)
[facility] Specialty Medication Onboarding    Specialty Medication: CABENUVA 600 mg-900 mg/3 mL extended-release injection (cabotegravir-rilpivirine)  Prior Authorization: Not Required   Financial Assistance: No - copay  <$25  Final Copay/Day Supply: $4 / 30    Insurance Restrictions: Yes - max 1 month supply     Notes to Pharmacist: initiation dose    The triage team has completed the benefits investigation and has determined that the patient is able to fill this medication at [facility]. Please contact the patient to complete the onboarding or follow up with the prescribing physician as needed.    [facility] Specialty Medication Onboarding    Specialty Medication: CABENUVA 600 mg-900 mg/3 mL extended-release injection (cabotegravir-rilpivirine)  Prior Authorization: Not Required   Financial Assistance: No - copay  <$25  Final Copay/Day Supply: $4 / 34    Insurance Restrictions: Yes - max 1 month supply     Notes to Pharmacist: maintenance dose    The triage team has completed the benefits investigation and has determined that the patient is able to fill this medication at [facility]. Please contact the patient to complete the onboarding or follow up with the prescribing physician as needed.

## 2021-02-10 NOTE — Unmapped (Signed)
This patient is receiving Cabenuva as a clinic administered medication. Patient is aware of copay of $4.00. The patient prefers all future communication to occur with the clinic. This patient is being disenrolled from our specialty management program and will be added to a patient list for appropriate follow up.    Shante Archambeault M. Jackson Coffield, Pharm.D.  Specialty Pharmacist  [facility]  [phone] option 4 then option 2

## 2021-02-10 NOTE — Unmapped (Addendum)
[facility]   Patient Onboarding/Medication Counseling    Kelly Cervantes is a 50 y.o. female with HIV who I am counseling today on initiation of therapy.  I am speaking to the patient.    Was a Nurse, learning disability used for this call? No    Verified patient's date of birth / HIPAA.    Specialty medication(s) to be sent: Infectious Disease: Cabenuva 600-900mg /38ml (cabotegravir-rilpivirine)      Non-specialty medications/supplies to be sent: n/a      Medications not needed at this time: n/a         Cabenuva IM injection Kit (600 cabotegravir/22ml and ripilvirine 900mg /62ml)    Medication & Administration     Dosage and Administration    ??? On the last day of your Dovato or as instructed by the clnic, the initial dose will be the injection of cabotegravir 600mg /3 ml intramuscularly in one gluteal muscle and ripilvirine 900mg /50ml in the other gluteal muscle or 2 cm apart in the same gluteal muscle every 30 days for 2 doses.  ??? Continuation dose for every 2 month injections: Inject cabotegravir 600mg /8ml in one gluteal muscle and ripilvirine 900mg /40ml in the other gluteal muscle.   ??? Patients may be given Cabenuva up to 7 days before or after the date the patient is scheduled to receive monthly or bimonthly injections.    ** If you are going to miss your appointment by greater than 7 days, contact the clinic**           Dosing: Every 2 Month - CABENUVA Official HCP Website ([email or web address])    Administration:     Before preparing the injections, remove CABENUVA from the refrigerator and wait at least 15 minutes to allow the medicines to come to room temperature. The vials may remain in the carton at room temperature for up to 6 hours; do not put back into the refrigerator. If not used within 6 hours, the medication must be discarded    Adherence/Missed dose instructions: Renaldo Harrison package insert)          Goals of Therapy     To suppress the replication of the HIV virus such that it will be undetectable on lab tests.    Side Effects & Monitoring Parameters   ??? Pain, redness, swelling, or other reaction where the injection was given.  ??? Feeling tired or weak.  ??? Dizziness or headache.  ??? Upset stomach.  ??? Trouble sleeping.      The following side effects should be reported to the provider:    ??? Signs of an allergic reaction, like rash; hives; itching; red, swollen, blistered, or peeling skin with or without fever; wheezing; tightness in the chest or throat; trouble breathing, swallowing, or talking; unusual hoarseness; or swelling of the mouth, face, lips, tongue, or throat.  ??? Signs of depression, suicidal thoughts, emotional ups and downs, abnormal thinking, anxiety, or lack of interest in life.  ??? Signs of liver problems like dark urine, feeling tired, not hungry, upset stomach or stomach pain, light-colored stools, throwing up, or yellow skin or eyes.  ??? Swollen gland.  ??? Bone, joint, or muscle pain.  ??? Eye irritation.  ??? Some people have had severe reactions within minutes after the injection. These include shortness of breath, feeling agitated, stomach cramps, flushing or feeling warm, sweating, numbness in the mouth, anxiety, and signs of blood pressure changes like severe headache or dizziness, passing out, or change in eyesight. Most signs went away within a few minutes  after the injection. Tell your doctor if you have any of these signs or other reaction after the injection.  ??? Changes in your immune system can happen when you start taking drugs to treat HIV. If you have an infection that you did not know you had, it may show up when you take this drug. Tell your doctor right away if you have any new signs after you start this drug, even after taking it for several months. This includes signs of infection like fever, sore throat, weakness, cough, or shortness of breath.      Contraindications, Warnings, & Precautions     ??? Hypersensitivity to cabotegravir, rilpivirine, or any component of the formulation  ??? Concomitant use with uridine diphosphate-glucuronosyl transferase and/or cytochrome P450 3A enzyme inducers (anticonvulsants [eg, carbamazepine, oxcarbazepine, phenobarbital, phenytoin], antimycobacterials [eg, rifabutin, rifampin, rifapentine], systemic dexamethasone [more than a single dose], St John's wort).  ??? Residual concentrations of cabotegravir and rilpivirine may remain in the systemic circulation of patients up to 12 months or longer. It is essential to initiate an alternative, fully suppressive antiretroviral regimen no later than 1 month after the final injections of CABENUVA when dosed monthly and no later than 2 months after the final injections when dosed every 2 months. If virologic failure is suspected, prescribe an alternative regimen as soon as possible.    Drug/Food Interactions     ??? Medication list reviewed in Epic. The patient was instructed to inform the care team before taking any new medications or supplements.   ??? Drug interaction:  o Quetiapine:  Both Cabenuva and quetiapine can widen the QT interval.  Patient had a normal EKG last month and with her low dose of quetiapine, the risk of cardiac arrhythmia is low.  Class B interaction per [employer].  According to [facility] there is no interaction.      Storage, Handling Precautions, & Disposal     ??? Store in the refrigerator  ??? The vials may remain in the carton at room temperature for up to 6 hours; do not put back into the refrigerator. If not used within 6 hours, the medication must be discarded        Current Medications (including OTC/herbals), Comorbidities and Allergies     Current Outpatient Medications   Medication Sig Dispense Refill   ??? acetaminophen (TYLENOL) 500 MG tablet      ??? albuterol HFA 90 mcg/actuation inhaler Inhale 2 puffs every six (6) hours as needed.     ??? buPROPion (WELLBUTRIN XL) 150 MG 24 hr tablet Take 150 mg by mouth daily.     ??? cabotegravir-rilpivirine (CABENUVA) 600 mg-900 mg/3 mL extended-release injection Inject 6 mL into the muscle every thirty (30) days for 2 doses. 12 mL 0   ??? cabotegravir-rilpivirine (CABENUVA) 600 mg-900 mg/3 mL extended-release injection Inject 6 mL into the muscle every 8 weeks. 6 mL 0   ??? diclofenac sodium (VOLTAREN) 1 % gel Apply 2 g topically.     ??? dolutegravir-lamivudine (DOVATO) 50-300 mg Tab      ??? fluticasone propionate (FLOVENT HFA) 110 mcg/actuation inhaler Inhale 2 puffs at bedtime.     ??? hydroCHLOROthiazide (HYDRODIURIL) 25 MG tablet Take 25 mg by mouth in the morning.     ??? hydrOXYzine (ATARAX) 25 MG tablet Take 50 mg by mouth Three (3) times a day as needed.     ??? hydrOXYzine (VISTARIL) 25 MG capsule      ??? levocetirizine (XYZAL) 5 MG tablet      ???  lurasidone (LATUDA) 20 mg Tab Take 20 mg by mouth daily with lunch.     ??? onabotulinumtoxinA (BOTOX) 200 unit SolR injection      ??? oxyCODONE (ROXICODONE) 5 MG immediate release tablet Take 5 mg by mouth Three (3) times a day as needed.     ??? pregabalin (LYRICA) 75 MG capsule Take 1 capsule by mouth Two (2) times a day.     ??? QUEtiapine (SEROQUEL) 50 MG tablet TAKE 1/2 TABLET BY MOUTH AT BEDTIME FOR 1 WEEK, THEN 1 TABLET AT BEDTIME THEREAFTER IF TOLERATED     ??? rimegepant (NURTEC ODT) 75 mg TbDL DISSOLVE 1 TABLET ON THE TONGUE DAILY AS NEEDED     ??? SUMAtriptan (IMITREX) 100 MG tablet Take 100 mg by mouth. as directed for migraine     ??? traZODone (DESYREL) 50 MG tablet Take 100 mg by mouth.     ??? triamcinolone (KENALOG) 0.5 % ointment Apply 1 application topically two (2) times a day as needed.       No current facility-administered medications for this visit.       Allergies   Allergen Reactions   ??? Naproxen Sodium Hives     Can take ibuprofen without causing any hives  Other reaction(s): Hives. Can take ibuprofen without causing any hives, Hives. Can take ibuprofen without causing any hives   ??? Naproxen        Patient Active Problem List   Diagnosis   ??? Anxiety   ??? Bipolar 1 disorder (CMS-HCC)   ??? Essential (primary) hypertension   ??? Chronic hepatitis C without hepatic coma (CMS-HCC)   ??? HIV infection (CMS-HCC)   ??? Major depressive disorder   ??? Migraine   ??? OSA on CPAP   ??? PTSD (post-traumatic stress disorder)   ??? Reactive airway disease   ??? Liver hemangioma   ??? Lumbosacral pain, chronic   ??? Seasonal and perennial allergic rhinoconjunctivitis       Reviewed and up to date in Epic.    Appropriateness of Therapy     Acute infections noted within Epic:  No active infections  Patient reported infection: None    Is medication and dose appropriate based on diagnosis and infection status? Yes    Prescription has been clinically reviewed: Yes      Baseline Quality of Life Assessment      How many days over the past month did your HIV  keep you from your normal activities? For example, brushing your teeth or getting up in the morning. 0    Financial Information     Medication Assistance provided: None Required    Anticipated copay of $4.00 reviewed with patient. Verified delivery address.    Delivery Information     Scheduled delivery date: 02/10/21    Expected start date: 02/12/21    Medication will be delivered via [facility] clinic to the temporary address in [place].  This shipment will not require a signature.      Explained the services we provide at [facility] and that each month we would call to set up refills.  Stressed importance of returning phone calls so that we could ensure they receive their medications in time each month.  Informed patient that we should be setting up refills 7-10 days prior to when they will run out of medication.  A pharmacist will reach out to perform a clinical assessment periodically.  Informed patient that a welcome packet,  containing information about our pharmacy and other support services, a Notice of Privacy Practices, and a drug information handout will be sent.      The patient or caregiver noted above participated in the development of this care plan and knows that they can request review of or adjustments to the care plan at any time.      Patient or caregiver verbalized understanding of the above information as well as how to contact the pharmacy at [phone] option 4 with any questions/concerns.  The pharmacy is open Monday through Friday 8:30am-4:30pm.  A pharmacist is available 24/7 via pager to answer any clinical questions they may have.    Patient Specific Needs     - Does the patient have any physical, cognitive, or cultural barriers? No    - Does the patient have adequate living arrangements? (i.e. the ability to store and take their medication appropriately) Yes    - Did you identify any home environmental safety or security hazards? No    - Patient prefers to have medications discussed with  Patient     - Is the patient or caregiver able to read and understand education materials at a high school level or above? Yes    - Patient's primary language is  English     - Is the patient high risk? No    - Does the patient require physician intervention or other additional services (i.e. dietary/nutrition, smoking cessation, social work)? No      Roderic Palau  [facility] Specialty Pharmacist

## 2021-02-11 ENCOUNTER — Telehealth: Payer: Self-pay | Admitting: Neurology

## 2021-02-11 ENCOUNTER — Ambulatory Visit: Payer: Medicaid Other | Admitting: Neurology

## 2021-02-11 DIAGNOSIS — Z21 Asymptomatic human immunodeficiency virus [HIV] infection status: Principal | ICD-10-CM

## 2021-02-11 NOTE — Telephone Encounter (Signed)
LVM & mychart msg sent informing pt to call us back to r/s due to MD out sick.

## 2021-02-12 ENCOUNTER — Institutional Professional Consult (permissible substitution): Admit: 2021-02-12 | Discharge: 2021-02-13 | Payer: BLUE CROSS/BLUE SHIELD

## 2021-02-12 DIAGNOSIS — Z21 Asymptomatic human immunodeficiency virus [HIV] infection status: Principal | ICD-10-CM

## 2021-02-12 LAB — HIV RNA, QUANTITATIVE, PCR: HIV RNA QNT RSLT: NOT DETECTED

## 2021-02-12 MED ADMIN — cabotegravir-rilpivirine (CABENUVA) 600 mg-900 mg/3 mL extended-release injection 6 mL: 6 mL | INTRAMUSCULAR | @ 14:00:00 | Stop: 2021-02-12

## 2021-02-12 NOTE — Unmapped (Signed)
Wm here for her first Cabenuva injections. Given patient education sheet and sign and symptoms of what to look out for were reviewed. No concerns voiced. Stated having full Hysterectomy in 09/2017 - no pregnancy test needed.  Tolerated injections without incident.   Instructed to use cold packs the first day if needed for injection site discomfort, and heat packs the next day if desired.   Next appointment made for 03/16/2021.

## 2021-02-12 NOTE — Unmapped (Signed)
Review of [facility] records.     Most recent note 06/20/20  - per this note - has hepatitis C genotype 1a. It was previously untreated. She had elevation in her LFTs in the past. She had elevated AFP. She had elastography - difficulty interpreting score. MRI of the liver was performed - no evidence of hepatic malignancy. She was concerned for a while about itching it appears - treated with hydroxyzine and permethrin cream x2 without success. There was concern that maybe her pruritis was due to untraeted HCV. She has had controlled HIV per this 06/20/20 note. 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 She went underwent laparoscopic open wedge resection from segment 7 of liver, results c/w partically calcified hemangioma and no evidence of malignancy. Also stage III fibrosis of liver parenchyma. At 06/20/20 - reportedly was eager to have HCV treated. She started this around 05/28/20. Per note I suspect she underwent 8 weeks of mavyret treatment.     Per prior notes, history of depression and anxiety, in part due to her HIV diagnosis.     These records do not contain labs but we now have some from [facility] in Care-everywhere.    As of 06/20/20 - her HCV RNA quant was 44 international units /mL from from 15,600,000 03/11/2020. Genotype 1a as above.   Last HIV viral load 06/20/20 - HIV RNA Quant <20. CD4 count 1500; 46% CD4 helper. 3245 total lymphocyte count.     04/30/20 - liver path - sclerosed hemangioma, chronic hepatitis with stage III fibrosis.   Her AFP was elevated 02/13/19 to 73.6 -> 54.6 06/04/19, 202.2 on 03/24/2020.     Last lipid profile that I can see is 03/11/2020 - TC 151, HDL 30, TG 369, LDL 77.   Last chem panel 08/31/82:  Glucose 105, otherwise BMP wnl.   AST 21, ALT 19, ALP 68, bili 0.4.   Her last A1c was 02/27/20 - 4.9.     She is established with ID at this time.     At next visit, I will consider GI referral given HCV history and stage III fibrosis.     Paulita Cradle, MD  [facility] [place]  [phone]

## 2021-02-16 ENCOUNTER — Ambulatory Visit
Admit: 2021-02-16 | Discharge: 2021-02-17 | Payer: BLUE CROSS/BLUE SHIELD | Attending: Student in an Organized Health Care Education/Training Program | Primary: Student in an Organized Health Care Education/Training Program

## 2021-02-16 DIAGNOSIS — L219 Seborrheic dermatitis, unspecified: Principal | ICD-10-CM

## 2021-02-16 MED ORDER — KETOCONAZOLE 2 % TOPICAL CREAM
Freq: Two times a day (BID) | TOPICAL | 11 refills | 30.00000 days | Status: CP
Start: 2021-02-16 — End: 2022-02-16

## 2021-02-16 MED ORDER — HYDROCORTISONE 2.5 % TOPICAL OINTMENT
Freq: Two times a day (BID) | TOPICAL | 1 refills | 0.00000 days | Status: CP
Start: 2021-02-16 — End: 2022-02-16

## 2021-02-16 NOTE — Unmapped (Signed)
This is likely a condition called seborrheic dermatitis.    Apply ketoconazole cream twice a day to rash. When it clears up, you can go down to a few times a week to keep the rash from coming back.    When the rash is flared up, apply hydrocortisone ointment in a thin layer to the area twice a day. Stop when flare goes down.

## 2021-02-16 NOTE — Unmapped (Signed)
Dermatology Note     Assessment and Plan:      Seborrheic dermatitis, face  - discussed etiology and chronic nature of condition, discussed management strategies   - start ketoconazole 2% cream BID to affected areas of face  - start hydrocortisone 2.5% ointment BID to affected areas of face as needed only for flares, discussed r/b/a of topical steroids including atrophy, dyspigmentation    The patient was advised to call for an appointment should any new, changing, or symptomatic lesions develop.     RTC: Return if symptoms worsen or fail to improve. or sooner as needed   _________________________________________________________________      Chief Complaint     Chief Complaint   Patient presents with   ??? Skin Check     Facial break out, red, itchy, dry spots under left eye       HPI     Kelly Cervantes is a 50 y.o. female who presents as a patient who is seen in consultation by Alesia Richards, MD at the request of Loreen Freud I* to [facility] for a rash. Rash present between the eyebrows, on the sides of the nose and medial cheeks for a few months. Dry and irritated. Some face washes seem to help. Not using any medicated topical creams or ointments on the area.     History of eczema on the back and legs that flares during the winter.     The patient denies any other new or changing lesions or areas of concern.     Pertinent Past Medical History     No history of skin cancer    HIV  Hep C, s/p treatment early 2022  History of hepatocellular carcinoma s/p wedge resection  Chronic hepatitis with stage III fibrosis  S/p hysterectomy  Bipolar disorder     Past Medical History, Family History, Social History, Medication List, Allergies, and Problem List were reviewed in the rooming section of Epic.     ROS: Other than symptoms mentioned in the HPI, no fevers, chills, or other skin complaints    Physical Examination     GENERAL: Well-appearing female in no acute distress, resting comfortably.  NEURO: Alert and oriented, answers questions appropriately  PSYCH: Normal mood and affect  SKIN (Focal Skin Exam): Per patient request, examination of face, scalp was performed  - hypopigmented slightly scaly erythematous patches on glabella, nasolabial folds and medial cheeks    All areas not commented on are within normal limits or unremarkable      (Approved Template 01/07/2020)

## 2021-02-17 NOTE — Progress Notes (Signed)
Virtual Visit via Video Note The purpose of this virtual visit is to provide medical care while limiting exposure to the novel coronavirus.    Consent was obtained for video visit:  Yes.   Answered questions that patient had about telehealth interaction:  Yes.   I discussed the limitations, risks, security and privacy concerns of performing an evaluation and management service by telemedicine. I also discussed with the patient that there may be a patient responsible charge related to this service. The patient expressed understanding and agreed to proceed.  Pt location: Home Physician Location: office Name of referring provider:  Azzie Glatter, FNP I connected with Teresa [name] at patients initiation/request on 02/19/2021 at 11:10 AM EDT by video enabled telemedicine application and verified that I am speaking with the correct person using two identifiers. Pt MRN:  [id number] Pt DOB:  30-Oct-1970 Video Participants:  Teresa [name]  Assessment and Plan:   Chronic migraine without aura, without status migrainosus, not intractable  Due to worsening headaches with status migrainosus, will check MRI of brain with and without contrast Migraine prevention:  Continue Botox.  Will add zonisamide titrating to 100mg  daily Migraine rescue:  continue sumatriptan 20mg  NS.  Will prescribe baclofen 10mg  as needed to treat the post-migraine myofascial cervical pain and postdrome headache. Zofran for nausea Limit use of pain relievers to no more than 2 days out of week to prevent risk of rebound or medication-overuse headache. Keep headache diary Follow up next Botox   History of Present Illness:  Teresa [name] is a 50 year old right-handed female with HIV, HTN, Bipolar disorder, Hepatitis C and history of liver cancer who follows up for migraine.  UPDATE: Started Botox April.  Overall, migraines are less intense but with only mild reduction in frequency.  Nurtec ineffective.  Her PCP  prescribed her sumatriptan NS, which helps abort the severe migraine but she continues to have residual neck pain and headache for several hours. Intensity:  moderate Duration:  15 minutes with sumatriptan NS but residual dull headache and neck pain for several hours Frequency:  2 to 3 days a week (down from 3 to 4 days a week) She did have status migrainosus with persistent migraine lasting from the last week of September through the first week of October.  Current NSAIDS/analgesics:  none Current triptans:  Sumatriptan 20mg  NS Current ergotamine:  none Current anti-emetic:  Zofran-ODT 4mg  Current muscle relaxants:  diazepam Current Antihypertensive medications:  Verapamil CR 240mg , HCTZ Current Antidepressant medications:  Sertraline 150mg  daily, Wellbutrin 150mg , trazodone Current Anticonvulsant medications:  Lyrica 75mg  BID Current anti-CGRP:  Nurtec (rescue) Current Vitamins/Herbal/Supplements:  none Current Antihistamines/Decongestants:  hydroxyzine, Xyzal Other therapy:  Botox, cold packs Hormone/birth control:  none Other medications:  Dovato, Mavyret  Caffeine:  2 cups of coffee daily Diet:  Ginger ale.  Drinks 3 to 4 bottles water daily Exercise: sometimes Depression:  yes; Anxiety:  yes Other pain:  no Sleep:  Poor.  Takes trazodone 2 hours to work. Still wakes up in middle of night.  HISTORY:  Onset:  2002.  Increased in 2018-2019. Location:Usually across the forehead and temple, especially left side, also back of neck Quality:  pulsating Intensity:  severe.   Aura:  Black moving spots in her vision preceding headache Associated symptoms:  Nausea, photophobia, phonophobia, feels off-balance.  She denies associated unilateral numbness or weakness. Duration:  All day Frequency:  At least 3 to 4 times a week Frequency of abortive medication: 3  to 4 days a week Triggers:  anxiety Relieving factors:  Laying down and rest Activity:  Aggravates headache   CT head on  01/29/2020 was normal.      Past NSAIDS/analgesics:  Fioricet Past abortive triptans:  sumatriptan 50mg  Past abortive ergotamine:  none Past muscle relaxants:  none Past anti-emetic:  none Past antihypertensive medications:  Amlodipine, lisinopril Past antidepressant medications:  nortriptyline, venlafaxine Past anticonvulsant medications:  topiramate, gabapentin Past anti-CGRP:  Emgality Past vitamins/Herbal/Supplements:  none Past antihistamines/decongestants:  Allegra Other past therapies:  none    Family history of headache:  Cousin (migraines)  Past Medical History: Past Medical History:  Diagnosis Date   Abdominal pain 06/20/2020   Anemia 2020   iron infusions x 2   Anxiety    Arthritis    Neck, left shoulder   Asthma    uses inhaler daily   Bipolar disorder ([place])    Depression    Dyspnea    with exertion   Eczema    GERD (gastroesophageal reflux disease)    Headache    Migraines  botox treatments   Hepatitis 2009   Hep C had treatment   HIV infection ([place])    Hypertension 2017   Neuromuscular disorder ([place])    pinch nerve, rt back   Ovarian cyst 03/24/2020   Pancreatitis    Pruritus 03/11/2020   Rash 09/18/2019   Seasonal allergies    Sleep apnea 08/2020   with C-Pap   SVD (spontaneous vaginal delivery)    x 1    Medications: Outpatient Encounter Medications as of 02/19/2021  Medication Sig   albuterol (PROAIR HFA) 108 (90 Base) MCG/ACT inhaler Inhale 2 puffs into the lungs every 6 (six) hours as needed for wheezing or shortness of breath.   azelastine (ASTELIN) 0.1 % nasal spray Place 1-2 sprays into both nostrils 2 (two) times daily as needed (drainage). Use in each nostril as directed   buPROPion (WELLBUTRIN XL) 150 MG 24 hr tablet Take 150 mg by mouth daily.   diclofenac Sodium (VOLTAREN) 1 % GEL Apply 2 g topically 4 (four) times daily as needed (pain).   docusate sodium (COLACE) 100 MG capsule Take 1 capsule (100 mg total) by mouth 2 (two)  times daily. (Patient not taking: No sig reported)   DOVATO 50-300 MG TABS TAKE 1 TABLET BY MOUTH EVERY DAY. MUST HAVE NEW PROVIDER FOR REFILLS.   fluticasone (FLOVENT HFA) 110 MCG/ACT inhaler Inhale 2 puffs into the lungs in the morning and at bedtime. with spacer and rinse mouth afterwards. (Patient taking differently: Inhale 2 puffs into the lungs every morning. with spacer and rinse mouth afterwards.)   hydrochlorothiazide (HYDRODIURIL) 25 MG tablet Take 1 tablet (25 mg total) by mouth daily.   hydrOXYzine (ATARAX/VISTARIL) 25 MG tablet Take 2 tablets (50 mg total) by mouth every 8 (eight) hours as needed.   levocetirizine (XYZAL) 5 MG tablet Take 1 tablet (5 mg total) by mouth every evening.   NURTEC 75 MG TBDP DISSOLVE 1 TABLET ON THE TONGUE DAILY AS NEEDED (Patient taking differently: Take 1 tablet by mouth daily as needed (migraines).)   oxyCODONE (OXY IR/ROXICODONE) 5 MG immediate release tablet Take 1 tablet (5 mg total) by mouth every 6 (six) hours as needed for severe pain.   predniSONE (STERAPRED UNI-PAK 21 TAB) 10 MG (21) TBPK tablet take 60mg  day 1, then 50mg  day 2, then 40mg  day 3, then 30mg  day 4, then 20mg  day 5, then 10mg  day 6, then STOP  pregabalin (LYRICA) 75 MG capsule Take 75 mg by mouth 2 (two) times daily.   QUEtiapine (SEROQUEL) 50 MG tablet Take 50 mg by mouth at bedtime.   sertraline (ZOLOFT) 100 MG tablet Take 1.5 tablets (150 mg total) by mouth at bedtime.   traZODone (DESYREL) 50 MG tablet Take 100 mg by mouth at bedtime.   triamcinolone ointment (KENALOG) 0.5 % Apply topically 2 (two) times daily. (Patient taking differently: Apply 1 application topically 2 (two) times daily as needed (irritation).)   No facility-administered encounter medications on file as of 02/19/2021.    Allergies: Allergies  Allergen Reactions   Naproxen Sodium Hives    Can take ibuprofen without causing any hives Other reaction(s): Hives. Can take ibuprofen without causing any hives,  Hives. Can take ibuprofen without causing any hives    Family History: Family History  Problem Relation Age of Onset   Breast cancer Paternal Grandmother    Breast cancer Cousin    Hypertension Mother    Lung cancer Father    Diabetes Father    Allergic rhinitis Maternal Aunt    Asthma Maternal Aunt    Angioedema Neg Hx    Atopy Neg Hx    Eczema Neg Hx    Immunodeficiency Neg Hx    Urticaria Neg Hx     Observations/Objective:   Height 5\' 7"  (1.702 m), weight 190 lb (86.2 kg), last menstrual period 07/10/2018. No acute distress.  Alert and oriented.  Speech fluent and not dysarthric.  Language intact.     Follow Up Instructions:    -I discussed the assessment and treatment plan with the patient. The patient was provided an opportunity to ask questions and all were answered. The patient agreed with the plan and demonstrated an understanding of the instructions.   The patient was advised to call back or seek an in-person evaluation if the symptoms worsen or if the condition fails to improve as anticipated.  Dudley Major, DO

## 2021-02-19 ENCOUNTER — Telehealth: Payer: Medicaid Other | Admitting: Neurology

## 2021-02-19 ENCOUNTER — Encounter: Payer: Self-pay | Admitting: Neurology

## 2021-02-19 ENCOUNTER — Other Ambulatory Visit: Payer: Self-pay

## 2021-02-19 VITALS — Ht 67.0 in | Wt 190.0 lb

## 2021-02-19 DIAGNOSIS — G43109 Migraine with aura, not intractable, without status migrainosus: Secondary | ICD-10-CM

## 2021-02-19 DIAGNOSIS — G43701 Chronic migraine without aura, not intractable, with status migrainosus: Secondary | ICD-10-CM

## 2021-02-19 DIAGNOSIS — G35 Multiple sclerosis: Secondary | ICD-10-CM

## 2021-02-19 MED ORDER — ZONISAMIDE 25 MG PO CAPS: ORAL_CAPSULE | ORAL | 0 refills | Status: DC

## 2021-02-19 MED ORDER — BACLOFEN 10 MG PO TABS: 10.0000 mg | ORAL_TABLET | Freq: Three times a day (TID) | ORAL | 5 refills | Status: DC | PRN

## 2021-02-19 NOTE — Addendum Note (Signed)
Addended by: Venetia Night on: 02/19/2021 01:17 PM   Modules accepted: Orders

## 2021-02-19 NOTE — Patient Instructions (Signed)
Botox Start zonisamide Backofen MRI

## 2021-02-23 DIAGNOSIS — Z0271 Encounter for disability determination: Secondary | ICD-10-CM

## 2021-03-02 ENCOUNTER — Ambulatory Visit: Admit: 2021-03-02 | Discharge: 2021-03-03 | Payer: BLUE CROSS/BLUE SHIELD

## 2021-03-02 DIAGNOSIS — F319 Bipolar disorder, unspecified: Principal | ICD-10-CM

## 2021-03-02 DIAGNOSIS — F329 Major depressive disorder, single episode, unspecified: Principal | ICD-10-CM
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
## 2021-03-02 NOTE — Unmapped (Signed)
It was nice to meet you today, I will send the medication we talked about for sleep     [facility]: [phone]    Please let me know if there's anything I can do for you prior to our next appointment. You can message me on mychart or call my voicemail at [phone].     Follow-up instructions:  -- Please continue taking your medications as prescribed for your mental health.   -- Do not make changes to your medications, including taking more or less than prescribed, unless under the supervision of your physician. Be aware that some medications may make you feel worse if abruptly stopped  -- Please refrain from using illicit substances, as these can affect your mood and could cause anxiety or other concerning symptoms.   -- Seek further medical care for any increase in symptoms or new symptoms such as thoughts of wanting to hurt yourself or hurt others.     Contact info:  Life-threatening emergencies: call 911 or go to the nearest ER for medical or psychiatric attention.     Issues that need urgent attention but are not life threatening: call the clinic at [phone].    Non-urgent routine concerns, questions, and refill requests: please call the clinic for assistance, or you may call my voicemail at [phone] and I will reply within 2 business days.     Regarding appointments:  - If you need to cancel your appointment, we ask that you call [phone] at least 24 hours before your scheduled appointment.  - If for any reason you arrive 15 minutes later than your scheduled appointment time, you may not be seen and your visit may be rescheduled.  - Please remember that we will not automatically reschedule missed appointments.  - If you miss two (2) appointments without letting us know at least 24 hours in advance, you will likely be referred to a provider in your community.  - We will do our best to be on time. Sometimes an emergency will arise that might cause your clinician to be late. We will try to inform you of this when you check in for your appointment. If you wait more than 15 minutes past your appointment time without such notice, please speak with the front desk staff.    In the event of bad weather, the clinic staff will attempt to contact you, should your appointment need to be rescheduled. Additionally, you can call the Patient Weather Line [phone] for system-wide clinic status    For more information and reminders regarding clinic policies (these were provided when you were admitted to the clinic), please ask the front desk.

## 2021-03-04 NOTE — Unmapped (Signed)
[facility] Administered Medication Refill Coordination Note      NAME:Kelly Cervantes DOB: 01/07/1971      Medication: Renaldo Harrison  Day Supply: 30 days      SHIPPING      Next delivery from [facility] [phone]) to [facility] for Kelly Cervantes is scheduled for 11/17.    Clinic contact: BJ Turner    Patient's next nurse visit for administration: 11/21.    We will follow up with clinic monthly for standard refill processing and delivery.      Jenaya Saar Samella Parr  Specialty Pharmacy Technician

## 2021-03-05 ENCOUNTER — Inpatient Hospital Stay: Admission: RE | Admit: 2021-03-05 | Payer: Medicaid Other | Source: Ambulatory Visit

## 2021-03-12 MED FILL — CABENUVA 600 MG/3 ML-900 MG/3 ML IM SUSPENSION, EXTENDED RELEASE: INTRAMUSCULAR | 30 days supply | Qty: 6 | Fill #1

## 2021-03-13 DIAGNOSIS — L219 Seborrheic dermatitis, unspecified: Principal | ICD-10-CM

## 2021-03-13 MED ORDER — HYDROCORTISONE 2.5 % TOPICAL OINTMENT
Freq: Two times a day (BID) | TOPICAL | 1 refills | 0.00000 days | Status: CP
Start: 2021-03-13 — End: 2022-03-13

## 2021-03-14 ENCOUNTER — Other Ambulatory Visit: Payer: Self-pay | Admitting: Neurology

## 2021-03-15 DIAGNOSIS — Z21 Asymptomatic human immunodeficiency virus [HIV] infection status: Principal | ICD-10-CM

## 2021-03-16 ENCOUNTER — Institutional Professional Consult (permissible substitution): Admit: 2021-03-16 | Discharge: 2021-03-17 | Payer: BLUE CROSS/BLUE SHIELD

## 2021-03-16 DIAGNOSIS — Z21 Asymptomatic human immunodeficiency virus [HIV] infection status: Principal | ICD-10-CM

## 2021-03-16 MED ADMIN — cabotegravir-rilpivirine (CABENUVA) 600 mg-900 mg/3 mL extended-release injection 6 mL: 6 mL | INTRAMUSCULAR | @ 15:00:00 | Stop: 2021-03-16

## 2021-03-16 NOTE — Unmapped (Signed)
Kelly Cervantes stated she had a few days after the Cabenuva Injections where she felt tired, run down, just not herself. Wants to go ahead and do the second dose. My body should be used to it now.    Tolerated second dose with out issues. Next appointment made for January.

## 2021-03-18 ENCOUNTER — Other Ambulatory Visit: Payer: Self-pay | Admitting: Neurology

## 2021-03-18 MED ORDER — PRAZOSIN 1 MG CAPSULE
ORAL_CAPSULE | Freq: Every evening | ORAL | 2 refills | 30 days | Status: CP
Start: 2021-03-18 — End: 2021-06-16

## 2021-03-18 MED ORDER — ZONISAMIDE 100 MG PO CAPS: 100.0000 mg | ORAL_CAPSULE | Freq: Every day | ORAL | 5 refills | Status: DC

## 2021-03-18 NOTE — Progress Notes (Signed)
Changed to zonisamide 100mg  capsule daily

## 2021-03-27 ENCOUNTER — Other Ambulatory Visit: Payer: Self-pay | Admitting: Allergy

## 2021-03-27 ENCOUNTER — Other Ambulatory Visit: Payer: Self-pay

## 2021-03-27 ENCOUNTER — Encounter: Payer: Self-pay | Admitting: Neurology

## 2021-03-27 ENCOUNTER — Other Ambulatory Visit (HOSPITAL_COMMUNITY): Payer: Self-pay

## 2021-03-27 DIAGNOSIS — G43701 Chronic migraine without aura, not intractable, with status migrainosus: Secondary | ICD-10-CM

## 2021-03-27 MED ORDER — BOTOX 200 UNITS IJ SOLR
INTRAMUSCULAR | 4 refills | Status: AC
  Filled 2021-03-27: qty 1, 90d supply, fill #0

## 2021-03-30 ENCOUNTER — Other Ambulatory Visit (HOSPITAL_COMMUNITY): Payer: Self-pay

## 2021-03-30 ENCOUNTER — Other Ambulatory Visit: Payer: Self-pay

## 2021-03-30 MED ORDER — CYCLOBENZAPRINE HCL 10 MG PO TABS
10.0000 mg | ORAL_TABLET | Freq: Three times a day (TID) | ORAL | 0 refills | Status: DC | PRN
  Filled 2021-03-30: qty 90, 30d supply, fill #0

## 2021-03-30 NOTE — Progress Notes (Signed)
Per DR.Jaffe send in a script for cyclobenzaprine 10mg  three times daily as needed (90 tablets).  Patient to  take caution for drowsiness.  Script sent. My chart message sent to patient to advised script.

## 2021-03-31 ENCOUNTER — Telehealth: Payer: Self-pay | Admitting: Neurology

## 2021-03-31 ENCOUNTER — Other Ambulatory Visit (HOSPITAL_COMMUNITY): Payer: Self-pay

## 2021-03-31 ENCOUNTER — Other Ambulatory Visit: Payer: Self-pay

## 2021-03-31 MED ORDER — CYCLOBENZAPRINE HCL 10 MG PO TABS: 10.0000 mg | ORAL_TABLET | Freq: Three times a day (TID) | ORAL | 0 refills | Status: DC | PRN

## 2021-03-31 NOTE — Telephone Encounter (Signed)
Re-sent Rx to [facility]. Called patient and informed her that I have resent her Rx to the pharmacy she has requested. Patient verbalized understanding and had no further questions or concern.

## 2021-03-31 NOTE — Telephone Encounter (Signed)
Pt is calling regarding her my chart. The RX was sent to wrong pharmacy.  Correct pharmacy [facility] [phone] [place]

## 2021-03-31 NOTE — Unmapped (Signed)
03/31/21-pt not due for Cabenuva injection @ this time will follow-up @ appropriate date-CB

## 2021-04-01 ENCOUNTER — Other Ambulatory Visit (HOSPITAL_COMMUNITY): Payer: Self-pay

## 2021-04-01 ENCOUNTER — Other Ambulatory Visit: Payer: Self-pay

## 2021-04-01 MED ORDER — CYCLOBENZAPRINE HCL 10 MG PO TABS
10.0000 mg | ORAL_TABLET | Freq: Three times a day (TID) | ORAL | 5 refills | Status: DC | PRN
Start: 2021-04-01 — End: 2021-06-10
  Filled 2021-04-01: qty 90, 30d supply, fill #0

## 2021-04-02 ENCOUNTER — Other Ambulatory Visit: Payer: Self-pay | Admitting: Surgery

## 2021-04-02 DIAGNOSIS — B1920 Unspecified viral hepatitis C without hepatic coma: Secondary | ICD-10-CM

## 2021-04-10 ENCOUNTER — Ambulatory Visit: Payer: Medicaid Other | Admitting: Neurology

## 2021-04-12 DIAGNOSIS — Z21 Asymptomatic human immunodeficiency virus [HIV] infection status: Principal | ICD-10-CM

## 2021-04-13 ENCOUNTER — Telehealth: Admit: 2021-04-13 | Discharge: 2021-04-14 | Payer: BLUE CROSS/BLUE SHIELD

## 2021-04-14 ENCOUNTER — Other Ambulatory Visit: Payer: Self-pay | Admitting: Neurology

## 2021-04-23 MED ORDER — PRAZOSIN 1 MG CAPSULE
ORAL_CAPSULE | 2 refills | 0 days | Status: CP
Start: 2021-04-23 — End: ?
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
## 2021-04-29 NOTE — Unmapped (Signed)
MD called and discussed briefly. Still very anxious, now has car. Working on rent assistance, has some meetings this week. No changes to medications.

## 2021-04-30 ENCOUNTER — Other Ambulatory Visit: Payer: Self-pay | Admitting: Neurology

## 2021-05-05 NOTE — Unmapped (Signed)
[facility] Administered Medication Refill Coordination Note      NAME:Kelly Cervantes DOB: 1970-12-13      Medication: Cabenuva    Day Supply: 56 days      SHIPPING      Next delivery from [facility] [phone]) to [facility] for [name] is scheduled for 01/12.    Clinic contact: BJ Turner    Patient's next nurse visit for administration: 01/25.    We will follow up with clinic monthly for standard refill processing and delivery.      Coralyn Roselli Samella Parr  Specialty Pharmacy Technician

## 2021-05-06 MED FILL — CABENUVA 600 MG/3 ML-900 MG/3 ML IM SUSPENSION, EXTENDED RELEASE: INTRAMUSCULAR | 34 days supply | Qty: 6 | Fill #0

## 2021-05-17 ENCOUNTER — Other Ambulatory Visit: Payer: Self-pay | Admitting: Neurology

## 2021-05-17 ENCOUNTER — Other Ambulatory Visit: Payer: Self-pay | Admitting: Allergy

## 2021-05-20 ENCOUNTER — Institutional Professional Consult (permissible substitution): Admit: 2021-05-20 | Discharge: 2021-05-21 | Payer: BLUE CROSS/BLUE SHIELD

## 2021-05-20 DIAGNOSIS — Z21 Asymptomatic human immunodeficiency virus [HIV] infection status: Principal | ICD-10-CM

## 2021-05-20 MED ADMIN — cabotegravir-rilpivirine (CABENUVA) 600 mg-900 mg/3 mL extended-release injection 6 mL: 6 mL | INTRAMUSCULAR | @ 15:00:00 | Stop: 2021-05-20

## 2021-05-20 NOTE — Unmapped (Signed)
RN administered Cabotegravir in the left upper outer quadrant of the gluteal muscle and Rilpivirine on the right. Pt. Tolerated the injections, and was advised to use warm compresses exercise and motrin to help with soreness at injection sites. Next appt was scheduled for 3/29@ 930.

## 2021-05-22 MED ORDER — HYDROCHLOROTHIAZIDE 25 MG TABLET
Freq: Every day | ORAL | 0 refills | 0 days
Start: 2021-05-22 — End: ?

## 2021-05-22 MED ORDER — PREGABALIN 75 MG CAPSULE
Freq: Two times a day (BID) | ORAL | 0 refills | 0 days
Start: 2021-05-22 — End: ?

## 2021-05-22 MED ORDER — HYDROXYZINE PAMOATE 25 MG CAPSULE
0 refills | 0 days
Start: 2021-05-22 — End: ?

## 2021-05-22 MED ORDER — LURASIDONE 20 MG TABLET
Freq: Every day | ORAL | 0 refills | 0 days
Start: 2021-05-22 — End: ?

## 2021-05-22 NOTE — Unmapped (Signed)
Patient is requesting the following refill  Requested Prescriptions     Pending Prescriptions Disp Refills   ??? pregabalin (LYRICA) 75 MG capsule  0     Sig: Take 1 capsule (75 mg total) by mouth Two (2) times a day.   ??? lurasidone (LATUDA) 20 mg Tab  0     Sig: Take 1 tablet (20 mg total) by mouth daily with lunch.   ??? hydroCHLOROthiazide (HYDRODIURIL) 25 MG tablet  0     Sig: Take 1 tablet (25 mg total) by mouth in the morning.   ??? hydrOXYzine (VISTARIL) 25 MG capsule  0       Recent Visits  Date Type Provider Dept   01/29/21 Office Visit Kathy Breach, MD [facility]   Showing recent visits within past 365 days with a meds authorizing provider and meeting all other requirements  Future Appointments  No visits were found meeting these conditions.  Showing future appointments within next 365 days with a meds authorizing provider and meeting all other requirements

## 2021-05-25 MED ORDER — LURASIDONE 20 MG TABLET
Freq: Every day | ORAL | 0 refills | 0 days
Start: 2021-05-25 — End: ?

## 2021-05-25 MED ORDER — ZONISAMIDE 100 MG CAPSULE
ORAL_CAPSULE | Freq: Every day | ORAL | 0 refills | 90 days
Start: 2021-05-25 — End: ?

## 2021-05-25 NOTE — Unmapped (Signed)
Patient called the office to request refills.

## 2021-05-26 MED ORDER — LURASIDONE 20 MG TABLET
ORAL_TABLET | Freq: Every day | ORAL | 2 refills | 30 days | Status: CP
Start: 2021-05-26 — End: 2021-08-24

## 2021-05-26 MED ORDER — ZONISAMIDE 100 MG CAPSULE
ORAL_CAPSULE | Freq: Every day | ORAL | 0 refills | 90 days | Status: CP
Start: 2021-05-26 — End: ?

## 2021-05-27 MED ORDER — HYDROXYZINE PAMOATE 25 MG CAPSULE
ORAL_CAPSULE | Freq: Three times a day (TID) | ORAL | 0 refills | 30 days | Status: CP | PRN
Start: 2021-05-27 — End: 2021-06-26

## 2021-05-27 MED ORDER — HYDROCHLOROTHIAZIDE 25 MG TABLET
ORAL_TABLET | Freq: Every day | ORAL | 0 refills | 90.00000 days | Status: CP
Start: 2021-05-27 — End: 2021-08-25

## 2021-05-27 MED ORDER — PREGABALIN 75 MG CAPSULE
Freq: Two times a day (BID) | ORAL | 0 refills | 0 days
Start: 2021-05-27 — End: ?

## 2021-06-01 ENCOUNTER — Ambulatory Visit: Admit: 2021-06-01 | Discharge: 2021-06-01 | Payer: BLUE CROSS/BLUE SHIELD

## 2021-06-01 DIAGNOSIS — Z5982 Transportation insecurity: Principal | ICD-10-CM

## 2021-06-01 DIAGNOSIS — Z658 Other specified problems related to psychosocial circumstances: Principal | ICD-10-CM

## 2021-06-01 DIAGNOSIS — Z599 Problem related to housing and economic circumstances, unspecified: Principal | ICD-10-CM

## 2021-06-01 DIAGNOSIS — G43119 Migraine with aura, intractable, without status migrainosus: Principal | ICD-10-CM

## 2021-06-01 MED ORDER — SUMATRIPTAN 20 MG/ACTUATION NASAL SPRAY
NASAL | 6 refills | 1 days | Status: CP | PRN
Start: 2021-06-01 — End: 2021-08-30

## 2021-06-01 MED ADMIN — ketorolac (TORADOL) injection 30 mg: 30 mg | INTRAMUSCULAR | @ 17:00:00 | Stop: 2021-06-01
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
## 2021-06-01 NOTE — Unmapped (Signed)
[facility]  [address] [place], [place], [place] [address] ?? ~4.2 mi  [phone]  Open ?? Closes 7 PM  Days of week Open hours   Monday 8 AM - 7 PM   Tuesday 8 AM - 7 PM   Wednesday 8 AM - 7 PM   Thursday 8 AM - 7 PM   Friday 8 AM - 3 PM   Saturday 9 AM - noon   Sunday Closed
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
## 2021-06-04 ENCOUNTER — Ambulatory Visit: Admit: 2021-06-04 | Discharge: 2021-06-04 | Payer: BLUE CROSS/BLUE SHIELD
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
## 2021-06-05 ENCOUNTER — Ambulatory Visit: Admit: 2021-06-05 | Discharge: 2021-06-05 | Payer: BLUE CROSS/BLUE SHIELD
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
## 2021-06-07 DIAGNOSIS — Z21 Asymptomatic human immunodeficiency virus [HIV] infection status: Principal | ICD-10-CM

## 2021-06-08 ENCOUNTER — Telehealth: Admit: 2021-06-08 | Discharge: 2021-06-09 | Payer: BLUE CROSS/BLUE SHIELD

## 2021-06-08 MED ORDER — FLUOXETINE 10 MG CAPSULE
ORAL_CAPSULE | Freq: Every day | ORAL | 2 refills | 30.00000 days | Status: CP
Start: 2021-06-08 — End: 2021-09-06

## 2021-06-08 MED ORDER — TIZANIDINE 4 MG CAPSULE
ORAL_CAPSULE | Freq: Every evening | ORAL | 2 refills | 30.00000 days | Status: CP | PRN
Start: 2021-06-08 — End: 2021-09-06
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
## 2021-06-09 ENCOUNTER — Other Ambulatory Visit: Payer: Self-pay | Admitting: Allergy

## 2021-06-09 ENCOUNTER — Other Ambulatory Visit: Payer: Self-pay | Admitting: Neurology

## 2021-06-11 NOTE — Unmapped (Signed)
Text sent:    [facility]: Kelly Cervantes, unfortunately a provider schedule change has impacted your appointment for 09/02/21 at 10:00. Your visit has been rescheduled with Dr. Duanne Limerick for 09/02/21 at 10:30.  If this change does not work for your schedule, please give our office a call at [phone]. Thank you, we apologize for the inconvenience.

## 2021-06-15 ENCOUNTER — Ambulatory Visit: Admit: 2021-06-15 | Discharge: 2021-06-16 | Payer: BLUE CROSS/BLUE SHIELD
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
## 2021-06-18 DIAGNOSIS — Z21 Asymptomatic human immunodeficiency virus [HIV] infection status: Principal | ICD-10-CM

## 2021-06-18 MED ORDER — CABENUVA 600 MG/3 ML-900 MG/3 ML IM SUSPENSION, EXTENDED RELEASE
INTRAMUSCULAR | 3 refills | 56.00000 days | Status: CP
Start: 2021-06-18 — End: ?

## 2021-06-18 NOTE — Unmapped (Signed)
Cabenuva refill requested.    Last office visit : 02/04/21    Next visit 09/02/21    Per provider's note:     During the visit today, I discussed with the patient the risks and benefits of Cabenuva     Next injection date - 07/22/21    Will route to provider covering for Dr.Ciccone.

## 2021-06-22 ENCOUNTER — Ambulatory Visit: Admit: 2021-06-22 | Discharge: 2021-06-23 | Payer: BLUE CROSS/BLUE SHIELD
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
## 2021-06-25 MED ORDER — HYDROXYZINE PAMOATE 25 MG CAPSULE
ORAL_CAPSULE | Freq: Three times a day (TID) | ORAL | 1 refills | 90 days | PRN
Start: 2021-06-25 — End: 2022-06-26

## 2021-06-25 NOTE — Unmapped (Signed)
Pt requesting 90 day supply

## 2021-06-26 MED ORDER — HYDROXYZINE PAMOATE 25 MG CAPSULE
ORAL_CAPSULE | Freq: Three times a day (TID) | ORAL | 0 refills | 90 days | Status: CP | PRN
Start: 2021-06-26 — End: 2022-06-27

## 2021-07-04 NOTE — Unmapped (Signed)
[facility] Administered Medication Refill Coordination Note      NAME:Enes Raftery DOB: May 06, 1970      Medication: Renaldo Harrison    Day Supply: 51      SHIPPING      Next delivery from [facility] [phone]) to [facility] [place] for Mechele Kittleson is scheduled for 07/15/21.    Clinic contact:Barbarajean Turner, RN      Patient's next nurse visit for administration: 07/22/2021.    We will follow up with clinic monthly for standard refill processing and delivery.      Unk Lightning  Specialty Pharmacy Technician

## 2021-07-06 ENCOUNTER — Telehealth: Admit: 2021-07-06 | Discharge: 2021-07-07 | Payer: BLUE CROSS/BLUE SHIELD

## 2021-07-06 MED ORDER — TIZANIDINE 4 MG CAPSULE
ORAL_CAPSULE | Freq: Every evening | ORAL | 2 refills | 30 days | Status: CP | PRN
Start: 2021-07-06 — End: 2021-10-04

## 2021-07-06 MED ORDER — FLUOXETINE 20 MG CAPSULE
ORAL_CAPSULE | Freq: Every day | ORAL | 3 refills | 90 days | Status: CP
Start: 2021-07-06 — End: 2022-07-06
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
## 2021-07-10 ENCOUNTER — Ambulatory Visit: Payer: Medicaid Other | Admitting: Neurology

## 2021-07-14 MED FILL — CABENUVA 600 MG/3 ML-900 MG/3 ML IM SUSPENSION, EXTENDED RELEASE: INTRAMUSCULAR | 34 days supply | Qty: 6 | Fill #0

## 2021-07-22 NOTE — Unmapped (Signed)
Writer contacted patient to check in and follow up and no answer. Writer unable to leave message for patient as voicemail is full. Writer will continue to attempt to make contact.  Total ECM time spent this month:   10

## 2021-07-22 NOTE — Unmapped (Signed)
Kelly Cervantes cancelled appt for 07/22/21 for Cabenuva. Called to help her schedule an appt that would keep her in compliance with the +/- 7 day rule for injections.   New appt made for 10am tomorrow.

## 2021-07-24 ENCOUNTER — Institutional Professional Consult (permissible substitution): Admit: 2021-07-24 | Discharge: 2021-07-25 | Payer: BLUE CROSS/BLUE SHIELD

## 2021-07-24 DIAGNOSIS — Z21 Asymptomatic human immunodeficiency virus [HIV] infection status: Principal | ICD-10-CM

## 2021-07-24 MED ADMIN — cabotegravir-rilpivirine (CABENUVA) 600 mg-900 mg/3 mL extended-release injection 6 mL: 6 mL | INTRAMUSCULAR | @ 14:00:00 | Stop: 2021-07-24

## 2021-07-24 NOTE — Unmapped (Signed)
Patient here for Cabenuva injections. Tolerated well. Pt. Reports trouble falling asleep-  Noticed this two months ago, after the last shot.  Once she gets her shot, she notices she is not able to fall asleep, and wakes up about 2-3 times per night. Pt. Is not  sure if its stress related or due to Cabenuva. RN will notify patient's provider and Cabenuva team. Pt. Also noticed a knot in the left upper outer quadrant of the dorsogluteal muscle, after her last injection. RN recommended warm compresses and Tylenol to help with soreness at the injection site. Next appt scheduled for 09/23/2021 during her visit with Dr. Kathaleen Grinder.

## 2021-07-26 ENCOUNTER — Other Ambulatory Visit: Payer: Self-pay | Admitting: Neurology

## 2021-08-02 DIAGNOSIS — Z21 Asymptomatic human immunodeficiency virus [HIV] infection status: Principal | ICD-10-CM

## 2021-08-04 MED ORDER — HYDROXYZINE HCL 25 MG TABLET
ORAL_TABLET | Freq: Four times a day (QID) | ORAL | 0 refills | 30 days | Status: CP | PRN
Start: 2021-08-04 — End: 2021-09-03

## 2021-08-05 ENCOUNTER — Other Ambulatory Visit: Payer: Self-pay | Admitting: Neurology

## 2021-08-05 NOTE — Unmapped (Signed)
[facility] Administered Medication Refill Coordination Note      NAME:Kelly Cervantes DOB: 08/25/1970      Medication: Cabenuva    Day Supply: 56 days      SHIPPING      Next delivery from [facility] [phone]) to [facility] clinic for Kelly Cervantes is scheduled for 05/27.    Clinic contact: BJ Turner    Patient's next nurse visit for administration: 05/03.    We will follow up with clinic monthly for standard refill processing and delivery.      Ruairi Stutsman Samella Parr  Specialty Pharmacy Technician

## 2021-08-10 ENCOUNTER — Other Ambulatory Visit: Payer: Self-pay | Admitting: *Deleted

## 2021-08-10 MED ORDER — LEVOCETIRIZINE DIHYDROCHLORIDE 5 MG PO TABS: 5.0000 mg | ORAL_TABLET | Freq: Every evening | ORAL | 5 refills | Status: AC

## 2021-08-17 ENCOUNTER — Telehealth: Admit: 2021-08-17 | Discharge: 2021-08-18 | Payer: BLUE CROSS/BLUE SHIELD

## 2021-08-17 MED ORDER — FLUOXETINE 20 MG CAPSULE
ORAL_CAPSULE | Freq: Every day | ORAL | 2 refills | 30 days | Status: CP
Start: 2021-08-17 — End: 2021-11-15
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
## 2021-08-19 ENCOUNTER — Other Ambulatory Visit: Payer: Self-pay | Admitting: Neurology

## 2021-08-19 MED FILL — CABENUVA 600 MG/3 ML-900 MG/3 ML IM SUSPENSION, EXTENDED RELEASE: INTRAMUSCULAR | 34 days supply | Qty: 6 | Fill #1

## 2021-08-20 NOTE — Unmapped (Unsigned)
Assessment/Plan:     Kelly Cervantes is a 51 y.o. female presenting today for ***    There are no diagnoses linked to this encounter.      No follow-ups on file.     I personally spent *** minutes face-to-face and non-face-to-face in the care of this patient, which includes all pre, intra, and post visit time on the date of service.     Paulita Cradle, MD  [facility]      Future Appointments   Date Time Provider Department Center   08/20/2021  3:40 PM Kathy Breach, MD [facility]   09/23/2021 10:30 AM Melanee Left, MD [facility]       Subjective:     Kelly Cervantes is a 51 y.o. female with history of   Patient Active Problem List   Diagnosis    Anxiety    Bipolar 1 disorder (CMS-HCC)    Essential (primary) hypertension    Chronic hepatitis C without hepatic coma (CMS-HCC)    HIV infection (CMS-HCC)    Major depressive disorder    Migraine    OSA on CPAP    PTSD (post-traumatic stress disorder)    Reactive airway disease    Liver hemangioma    Lumbosacral pain, chronic    Seasonal and perennial allergic rhinoconjunctivitis       Here today for *** visit.        HPI:   ***      Review of Systems:  ROS  As above otherwise per HPI    Past Medical History:   Past Medical History:   Diagnosis Date    Anemia 2010    Anxiety 2014    Arthritis 2019    Clotting disorder (CMS-HCC) 2005    Depression 2009    HIV disease (CMS-HCC) 2009    Hypertension 2014    Neuromuscular disorder (CMS-HCC) 2022    Obesity 2022         Past Surgical History:  Past Surgical History:   Procedure Laterality Date    CESAREAN SECTION  1610,9604    FRACTURE SURGERY  2004,2014    HERNIA REPAIR  2018,2020+2    HYSTERECTOMY  2020         Social History:  Social History     Socioeconomic History    Marital status: Legally Separated   Tobacco Use    Smoking status: Former     Packs/day: 0.50     Years: 20.00     Pack years: 10.00     Types: Cigarettes     Start date: 04/04/1990     Quit date: 09/29/2017     Years since quitting: 3.8     Passive exposure: Current    Smokeless tobacco: Never   Substance and Sexual Activity    Alcohol use: Yes     Alcohol/week: 2.0 standard drinks     Types: 2 Cans of beer per week    Drug use: Not Currently     Types: Cocaine, Marijuana     Comment: edibles    Sexual activity: Yes     Partners: Male     Birth control/protection: Surgical   Other Topics Concern    Do you use sunscreen? Yes    Tanning bed use? No    Are you easily burned? No    Excessive sun exposure? No    Blistering sunburns? Yes   Social History Narrative  Just moved from [place]    3 children    38 year old girl    37 year old son    36 year old daughter    Husband is truck driver - long haul.     She is from [place] - grew up in [place] .      Social Determinants of Health     Financial Resource Strain: High Risk    Difficulty of Paying Living Expenses: Very hard   Food Insecurity: No Food Insecurity    Worried About Programme researcher, broadcasting/film/video in the Last Year: Never true    Barista in the Last Year: Never true   Transportation Needs: Mudlogger (Medical): Yes    Lack of Transportation (Non-Medical): Yes   Physical Activity: Insufficiently Active    Days of Exercise per Week: 2 days    Minutes of Exercise per Session: 60 min   Stress: Stress Concern Present    Feeling of Stress : Very much   Social Connections: Socially Integrated    Frequency of Communication with Friends and Family: More than three times a week    Frequency of Social Gatherings with Friends and Family: Once a week    Attends Religious Services: More than 4 times per year    Active Member of [employer] or Organizations: Yes    Attends Engineer, structural: More than 4 times per year    Marital Status: Married         Medications:    Current Outpatient Medications   Medication Sig Dispense Refill    acetaminophen (TYLENOL) 500 MG tablet       albuterol HFA 90 mcg/actuation inhaler Inhale 2 puffs every six (6) hours as needed.      baclofen (LIORESAL) 10 MG tablet Take 10 mg by mouth.      buPROPion (WELLBUTRIN XL) 150 MG 24 hr tablet Take 150 mg by mouth daily.      cabotegravir-rilpivirine (CABENUVA) 600 mg-900 mg/3 mL extended-release injection Inject 6 mL into the muscle every thirty (30) days for 2 doses. 12 mL 0    cabotegravir-rilpivirine (CABENUVA) 600 mg-900 mg/3 mL extended-release injection Inject 6 mL into the muscle every 8 weeks. 6 mL 3    diclofenac sodium (VOLTAREN) 1 % gel Apply 2 g topically.      FLUoxetine (PROZAC) 20 MG capsule Take 1 capsule (20 mg total) by mouth daily. 30 capsule 2    fluticasone propionate (FLOVENT HFA) 110 mcg/actuation inhaler Inhale 2 puffs at bedtime.      hydroCHLOROthiazide (HYDRODIURIL) 25 MG tablet Take 1 tablet (25 mg total) by mouth in the morning. 90 tablet 0    hydrocortisone 2.5 % ointment Apply 1 application topically Two (2) times a day. As needed to affected areas of face only when flaring. 30 g 1    hydrOXYzine (ATARAX) 25 MG tablet Take 1 tablet (25 mg total) by mouth every six (6) hours as needed for anxiety or other. 120 tablet 0    hydrOXYzine (VISTARIL) 25 MG capsule Take 1 capsule (25 mg total) by mouth Three (3) times a day as needed. 270 capsule 0    ketoconazole (NIZORAL) 2 % cream Apply 1 application topically Two (2) times a day. Twice a day to affected areas of face. Ok to use a few times a week for maintenance when clear. 30 g 11    levocetirizine (XYZAL) 5 MG tablet  lurasidone (LATUDA) 20 mg Tab Take 1 tablet (20 mg total) by mouth daily with lunch. 30 tablet 2    lurasidone (LATUDA) 20 mg Tab Take 20 mg by mouth.      onabotulinumtoxinA (BOTOX) 200 unit SolR injection       ondansetron (ZOFRAN) 4 MG tablet TAKE 1 TABLET BY MOUTH EVERY DAY AS NEEDED FOR NAUSEA      oxyCODONE (ROXICODONE) 5 MG immediate release tablet Take 5 mg by mouth Three (3) times a day as needed.      prazosin (MINIPRESS) 1 MG capsule TAKE 1 CAPSULE BY MOUTH NIGHTLY 30 capsule 2    pregabalin (LYRICA) 75 MG capsule Take 1 capsule by mouth Two (2) times a day.      SUMAtriptan (IMITREX) 20 mg/actuation nasal spray 1 spray (20 mg total) into alternating nostrils every two (2) hours as needed for migraine. May repeat dose in 2 hours if needed. No more than 2 sprays per day. 1 each 6    TiZANidine (ZANAFLEX) 4 MG capsule Take 2 capsules (8 mg total) by mouth nightly as needed for muscle spasms. 60 capsule 2    traZODone (DESYREL) 50 MG tablet Take 100 mg by mouth.      triamcinolone (KENALOG) 0.5 % ointment Apply 1 application topically two (2) times a day as needed.      zonisamide (ZONEGRAN) 100 MG capsule Take 1 capsule (100 mg total) by mouth daily. 90 capsule 0     No current facility-administered medications for this visit.        Objective:      There were no vitals filed for this visit.   There is no height or weight on file to calculate BMI.    Physical Exam

## 2021-08-21 ENCOUNTER — Institutional Professional Consult (permissible substitution)
Admit: 2021-08-21 | Discharge: 2021-08-22 | Payer: BLUE CROSS/BLUE SHIELD | Attending: Family Medicine | Primary: Family Medicine

## 2021-08-21 DIAGNOSIS — R052 Subacute cough: Principal | ICD-10-CM

## 2021-08-21 NOTE — Unmapped (Signed)
TeleHealth Virtual Encounter  This medical encounter was conducted virtually using [email or web address]  TeleHealth protocols.    I have identified myself to the patient and conveyed my credentials to Kelly Cervantes.    I have explained the capabilities and limitations of telemedicine and the patient and myself both agree that it is appropriate for their current circumstances/symptoms.    In case we get disconnected, patient's phone number is [phone] (home)      Is there someone else in the room? No.     Assessment/Plan:      Subacute cough  While I suspect the patient has reactive airway, I cannot rule out a bacterial infection requiring antibiotics. I declined to treat her based on a telehealth visit and asked her to either schedule an in-person visit, go to urgent care or back to ED. Unfortunately the patient is traveling from [place].    Preventive services addressed today   none      No orders of the defined types were placed in this encounter.       I discussed with the patient the new prescription noted above including potential side effects, drug interactions and proper dosing.    Return for in-person visit or go to urgent care..    Medication adherence and barriers to the treatment plan have been addressed. Opportunities to optimize healthy behaviors have been discussed. Patient / caregiver voiced understanding.      Greater than 50% of this virtual visit was spent in direct patient care counseling on patient problems, recommendations and treatments including medications and their doses along with the complications when mixed with other medications, coordination of treatment for other providers and appointments.           Subjective:     Kelly Cervantes is a 51 y.o. female who presents for an acute virtual visit and reports the following:     No chief complaint on file.      This patients last WCC/CPE date: : Not Found    HPI      Presents for telehealth. ED last week for cough. Was given tessalon perles but medicaid would not pay for it. Recommended to get delsym but not resolved. Endorses cough is dry raspy. She is using her inhaler but this is not working.    She endorses shortness of breath. Breathing deeply causes a cough.    Cough now 3 weeks.    ROS    As per HPI.    Past Medical History:   Diagnosis Date    Anemia 2010    Anxiety 2014    Arthritis 2019    Clotting disorder (CMS-HCC) 2005    Depression 2009    HIV disease (CMS-HCC) 2009    Hypertension 2014    Neuromuscular disorder (CMS-HCC) 2022    Obesity 2022       Past Surgical History:   Procedure Laterality Date    CESAREAN SECTION  0981,1914    FRACTURE SURGERY  2004,2014    HERNIA REPAIR  2018,2020+2    HYSTERECTOMY  2020       Family History   Problem Relation Age of Onset    Heart disease Mother     Hypertension Mother     Cancer Father     Arthritis Maternal Aunt     Cancer Maternal Aunt     COPD Maternal Aunt     Mental illness Maternal Aunt     Arthritis Maternal Uncle     Cancer Paternal Aunt  Cancer Maternal Grandmother     Alcohol abuse Maternal Grandfather     Diabetes Maternal Grandfather     Stroke Maternal Grandfather     Liver disease Maternal Grandfather     Cancer Paternal Grandmother     Diabetes Paternal Grandmother     Cancer Paternal Grandfather     Melanoma Neg Hx        Naproxen sodium and Naproxen    Current Outpatient Medications   Medication Sig Dispense Refill    acetaminophen (TYLENOL) 500 MG tablet       albuterol HFA 90 mcg/actuation inhaler Inhale 2 puffs every six (6) hours as needed.      baclofen (LIORESAL) 10 MG tablet Take 10 mg by mouth.      buPROPion (WELLBUTRIN XL) 150 MG 24 hr tablet Take 150 mg by mouth daily.      cabotegravir-rilpivirine (CABENUVA) 600 mg-900 mg/3 mL extended-release injection Inject 6 mL into the muscle every thirty (30) days for 2 doses. 12 mL 0    cabotegravir-rilpivirine (CABENUVA) 600 mg-900 mg/3 mL extended-release injection Inject 6 mL into the muscle every 8 weeks. 6 mL 3    diclofenac sodium (VOLTAREN) 1 % gel Apply 2 g topically.      FLUoxetine (PROZAC) 20 MG capsule Take 1 capsule (20 mg total) by mouth daily. 30 capsule 2    fluticasone propionate (FLOVENT HFA) 110 mcg/actuation inhaler Inhale 2 puffs at bedtime.      hydroCHLOROthiazide (HYDRODIURIL) 25 MG tablet Take 1 tablet (25 mg total) by mouth in the morning. 90 tablet 0    hydrocortisone 2.5 % ointment Apply 1 application topically Two (2) times a day. As needed to affected areas of face only when flaring. 30 g 1    hydrOXYzine (ATARAX) 25 MG tablet Take 1 tablet (25 mg total) by mouth every six (6) hours as needed for anxiety or other. 120 tablet 0    hydrOXYzine (VISTARIL) 25 MG capsule Take 1 capsule (25 mg total) by mouth Three (3) times a day as needed. 270 capsule 0    ketoconazole (NIZORAL) 2 % cream Apply 1 application topically Two (2) times a day. Twice a day to affected areas of face. Ok to use a few times a week for maintenance when clear. 30 g 11    levocetirizine (XYZAL) 5 MG tablet       lurasidone (LATUDA) 20 mg Tab Take 1 tablet (20 mg total) by mouth daily with lunch. 30 tablet 2    lurasidone (LATUDA) 20 mg Tab Take 20 mg by mouth.      onabotulinumtoxinA (BOTOX) 200 unit SolR injection       ondansetron (ZOFRAN) 4 MG tablet TAKE 1 TABLET BY MOUTH EVERY DAY AS NEEDED FOR NAUSEA      oxyCODONE (ROXICODONE) 5 MG immediate release tablet Take 5 mg by mouth Three (3) times a day as needed.      prazosin (MINIPRESS) 1 MG capsule TAKE 1 CAPSULE BY MOUTH NIGHTLY 30 capsule 2    pregabalin (LYRICA) 75 MG capsule Take 1 capsule by mouth Two (2) times a day.      SUMAtriptan (IMITREX) 20 mg/actuation nasal spray 1 spray (20 mg total) into alternating nostrils every two (2) hours as needed for migraine. May repeat dose in 2 hours if needed. No more than 2 sprays per day. 1 each 6    TiZANidine (ZANAFLEX) 4 MG capsule Take 2 capsules (8 mg total) by mouth nightly as needed  for muscle spasms. 60 capsule 2    traZODone (DESYREL) 50 MG tablet Take 100 mg by mouth.      triamcinolone (KENALOG) 0.5 % ointment Apply 1 application topically two (2) times a day as needed.      zonisamide (ZONEGRAN) 100 MG capsule Take 1 capsule (100 mg total) by mouth daily. 90 capsule 0     No current facility-administered medications for this visit.       I have reviewed the problem list, medications, and allergies and have updated/reconciled them if needed.            Objective:     As part of this Virtual Visit, no in-person exam was conducted.         08/21/21     Charma Igo, FNP      The patient reports they are currently: at home. I spent 10 minutes on the real-time audio and video with the patient on the date of service. I spent an additional 10 minutes on pre- and post-visit activities on the date of service.     The patient was physically located in [place] or a state in which I am permitted to provide care. The patient and/or parent/guardian understood that s/he may incur co-pays and cost sharing, and agreed to the telemedicine visit. The visit was reasonable and appropriate under the circumstances given the patient's presentation at the time.    The patient and/or parent/guardian has been advised of the potential risks and limitations of this mode of treatment (including, but not limited to, the absence of in-person examination) and has agreed to be treated using telemedicine. The patient's/patient's family's questions regarding telemedicine have been answered.     If the visit was completed in an ambulatory setting, the patient and/or parent/guardian has also been advised to contact their provider???s office for worsening conditions, and seek emergency medical treatment and/or call 911 if the patient deems either necessary.

## 2021-08-30 DIAGNOSIS — Z21 Asymptomatic human immunodeficiency virus [HIV] infection status: Principal | ICD-10-CM

## 2021-09-09 NOTE — Progress Notes (Deleted)
NEUROLOGY FOLLOW UP OFFICE NOTE  LEATHER ESTIS [id number]  Assessment/Plan:   Chronic migraine without aura, without status migrainosus, not intractable   Due to worsening headaches with status migrainosus, will check MRI of brain with and without contrast Migraine prevention:  Continue Botox.  Will add zonisamide titrating to '100mg'$  daily Migraine rescue:  continue sumatriptan '20mg'$  NS.  Will prescribe baclofen '10mg'$  as needed to treat the post-migraine myofascial cervical pain and postdrome headache. Zofran for nausea Limit use of pain relievers to no more than 2 days out of week to prevent risk of rebound or medication-overuse headache. Keep headache diary Follow up next Botox  Subjective:  Teresa [name] is a 51 year old right-handed female with HIV, HTN, Bipolar disorder, Hepatitis C and history of liver cancer who follows up for migraine.   UPDATE: In October, she endorsed worsening migraines with status migrainosus.  MRI of brain was ordered but never performed because she had to cancel but never heard back from anyone to reschedule.  Zonisamide was started.  She was prescribed baclofen for neck pain but ineffective, so was switched to Flexeril.  She was to continue Botox *** Intensity:  moderate Duration:  15 minutes with sumatriptan NS but residual dull headache and neck pain for several hours Frequency:  2 to 3 days a week (down from 3 to 4 days a week) She did have status migrainosus with persistent migraine lasting from the last week of September through the first week of October.   Current NSAIDS/analgesics:  none Current triptans:  Sumatriptan '20mg'$  NS Current ergotamine:  none Current anti-emetic:  Zofran-ODT '4mg'$  Current muscle relaxants:  Flexeril '10mg'$  (for post-migraine myofascial cervical pain), diazepam Current Antihypertensive medications:  Verapamil CR '240mg'$ , HCTZ Current Antidepressant medications:  Sertraline '150mg'$  daily, Wellbutrin '150mg'$ , trazodone Current  Anticonvulsant medications:  Zonismaide '100mg'$  daily, Lyrica '75mg'$  BID Current anti-CGRP:  none Current Vitamins/Herbal/Supplements:  none Current Antihistamines/Decongestants:  hydroxyzine, Xyzal Other therapy:  Botox, cold packs Hormone/birth control:  none Other medications:  Dovato, Mavyret   Caffeine:  2 cups of coffee daily Diet:  Ginger ale.  Drinks 3 to 4 bottles water daily Exercise: sometimes Depression:  yes; Anxiety:  yes Other pain:  no Sleep:  Poor.  Takes trazodone 2 hours to work. Still wakes up in middle of night.   HISTORY:  Onset:  2002.  Increased in 2018-2019. Location:Usually across the forehead and temple, especially left side, also back of neck Quality:  pulsating Intensity:  severe.   Aura:  Black moving spots in her vision preceding headache Associated symptoms:  Nausea, photophobia, phonophobia, feels off-balance.  She denies associated unilateral numbness or weakness. Duration:  All day Frequency:  At least 3 to 4 times a week Frequency of abortive medication: 3 to 4 days a week Triggers:  anxiety Relieving factors:  Laying down and rest Activity:  Aggravates headache   CT head on 01/29/2020 was normal.       Past NSAIDS/analgesics:  Fioricet Past abortive triptans:  sumatriptan '50mg'$  Past abortive ergotamine:  none Past muscle relaxants:  baclofen Past anti-emetic:  none Past antihypertensive medications:  Amlodipine, lisinopril Past antidepressant medications:  nortriptyline, venlafaxine Past anticonvulsant medications:  topiramate, gabapentin Past anti-CGRP:  Emgality, Nurtec Past vitamins/Herbal/Supplements:  none Past antihistamines/decongestants:  Allegra Other past therapies:  none     Family history of headache:  Cousin (migraines)  PAST MEDICAL HISTORY: Past Medical History:  Diagnosis Date   Abdominal pain 06/20/2020   Anemia 2020  iron infusions x 2   Anxiety    Arthritis    Neck, left shoulder   Asthma    uses inhaler  daily   Bipolar disorder ([place])    Depression    Dyspnea    with exertion   Eczema    GERD (gastroesophageal reflux disease)    Headache    Migraines  botox treatments   Hepatitis 2009   Hep C had treatment   HIV infection ([place])    Hypertension 2017   Neuromuscular disorder ([place])    pinch nerve, rt back   Ovarian cyst 03/24/2020   Pancreatitis    Pruritus 03/11/2020   Rash 09/18/2019   Seasonal allergies    Sleep apnea 08/2020   with C-Pap   SVD (spontaneous vaginal delivery)    x 1    MEDICATIONS: Current Outpatient Medications on File Prior to Visit  Medication Sig Dispense Refill   albuterol (PROAIR HFA) 108 (90 Base) MCG/ACT inhaler Inhale 2 puffs into the lungs every 6 (six) hours as needed for wheezing or shortness of breath. 8 g 1   azelastine (ASTELIN) 0.1 % nasal spray Place 1-2 sprays into both nostrils 2 (two) times daily as needed (drainage). Use in each nostril as directed 30 mL 5   baclofen (LIORESAL) 10 MG tablet TAKE 1 TABLET BY MOUTH THREE TIMES A DAY AS NEEDED FOR MUSCLE SPASMS 90 tablet 1   Botulinum Toxin Type A (BOTOX) 200 units SOLR Inject 155 units IM into Multiple sites of the face, neck and head every 90 days 1 each 4   buPROPion (WELLBUTRIN XL) 150 MG 24 hr tablet Take 150 mg by mouth daily.     cyclobenzaprine (FLEXERIL) 10 MG tablet TAKE 1 TABLET BY MOUTH THREE TIMES A DAY AS NEEDED FOR MUSCLE SPASMS 90 tablet 5   diclofenac Sodium (VOLTAREN) 1 % GEL Apply 2 g topically 4 (four) times daily as needed (pain).     docusate sodium (COLACE) 100 MG capsule Take 1 capsule (100 mg total) by mouth 2 (two) times daily. (Patient not taking: No sig reported) 60 capsule 0   DOVATO 50-300 MG TABS TAKE 1 TABLET BY MOUTH EVERY DAY. MUST HAVE NEW PROVIDER FOR REFILLS. 30 tablet 0   fluticasone (FLOVENT HFA) 110 MCG/ACT inhaler Inhale 2 puffs into the lungs in the morning and at bedtime. with spacer and rinse mouth afterwards. (Patient taking differently: Inhale 2  puffs into the lungs every morning. with spacer and rinse mouth afterwards.) 1 each 5   hydrochlorothiazide (HYDRODIURIL) 25 MG tablet Take 1 tablet (25 mg total) by mouth daily. 90 tablet 3   hydrOXYzine (ATARAX/VISTARIL) 25 MG tablet Take 2 tablets (50 mg total) by mouth every 8 (eight) hours as needed. 90 tablet 3   LATUDA 20 MG TABS tablet Take 20 mg by mouth at bedtime.     levocetirizine (XYZAL) 5 MG tablet Take 1 tablet (5 mg total) by mouth every evening. 30 tablet 5   NURTEC 75 MG TBDP DISSOLVE 1 TABLET ON THE TONGUE DAILY AS NEEDED (Patient taking differently: Take 1 tablet by mouth daily as needed (migraines).) 16 tablet 5   oxyCODONE (OXY IR/ROXICODONE) 5 MG immediate release tablet Take 1 tablet (5 mg total) by mouth every 6 (six) hours as needed for severe pain. 10 tablet 0   pregabalin (LYRICA) 75 MG capsule Take 75 mg by mouth 2 (two) times daily.     QUEtiapine (SEROQUEL) 50 MG tablet Take 50 mg by  mouth at bedtime.     sertraline (ZOLOFT) 100 MG tablet Take 1.5 tablets (150 mg total) by mouth at bedtime. 90 tablet 3   SUMAtriptan (IMITREX) 20 MG/ACT nasal spray SMARTSIG:Both Nares     traZODone (DESYREL) 50 MG tablet Take 100 mg by mouth at bedtime.     triamcinolone ointment (KENALOG) 0.5 % Apply topically 2 (two) times daily. (Patient taking differently: Apply 1 application topically 2 (two) times daily as needed (irritation).) 30 g 0   zonisamide (ZONEGRAN) 100 MG capsule TAKE 1 CAPSULE BY MOUTH EVERY DAY 90 capsule 1   No current facility-administered medications on file prior to visit.    ALLERGIES: Allergies  Allergen Reactions   Naproxen Sodium Hives    Can take ibuprofen without causing any hives Other reaction(s): Hives. Can take ibuprofen without causing any hives, Hives. Can take ibuprofen without causing any hives    FAMILY HISTORY: Family History  Problem Relation Age of Onset   Breast cancer Paternal Grandmother    Breast cancer Cousin    Hypertension  Mother    Lung cancer Father    Diabetes Father    Allergic rhinitis Maternal Aunt    Asthma Maternal Aunt    Angioedema Neg Hx    Atopy Neg Hx    Eczema Neg Hx    Immunodeficiency Neg Hx    Urticaria Neg Hx       Objective:  *** General: No acute distress.  Patient appears ***-groomed.   Head:  Normocephalic/atraumatic Eyes:  Fundi examined but not visualized Neck: supple, no paraspinal tenderness, full range of motion Heart:  Regular rate and rhythm Lungs:  Clear to auscultation bilaterally Back: No paraspinal tenderness Neurological Exam: alert and oriented to person, place, and time.  Speech fluent and not dysarthric, language intact.  CN II-XII intact. Bulk and tone normal, muscle strength 5/5 throughout.  Sensation to light touch intact.  Deep tendon reflexes 2+ throughout, toes downgoing.  Finger to nose testing intact.  Gait normal, Romberg negative.   Metta Clines, DO  CC: ***

## 2021-09-10 ENCOUNTER — Encounter: Payer: Self-pay | Admitting: Neurology

## 2021-09-10 ENCOUNTER — Ambulatory Visit: Payer: Medicaid Other | Admitting: Neurology

## 2021-09-10 DIAGNOSIS — Z029 Encounter for administrative examinations, unspecified: Secondary | ICD-10-CM

## 2021-09-22 ENCOUNTER — Encounter: Payer: Self-pay | Admitting: Neurology

## 2021-09-23 ENCOUNTER — Ambulatory Visit: Admit: 2021-09-23 | Payer: BLUE CROSS/BLUE SHIELD

## 2021-09-23 NOTE — Unmapped (Unsigned)
Infectious Disease Clinic New Patient Visit      Assessment and Plan:   HIV Infection: diagnosed 2009 while pregnant. Has been virally suppressed since starting ART, Past regimens have included Kaletra/truvada, Triumeq - currently on Dovato and tolerating well.   - tolerating ART well, continue Dovato (dolutegravir-lamivudine) for now, considering transition to LAI (see discussion below)  - rarely misses doses  - genotype done in 2009 - will obtain records from [facility] to eval for NNRTI resistance in anticipation of transition to LAI  - last CD4:   Absolute CD4 Count   Date Value Ref Range Status   02/04/2021 1,125 510 - 2,320 /uL Final   06/20/2020 1,500 510 - 2,320 /uL Final     Comment:     via [facility] (imported 07Oct2022)   03/11/2020 914 510 - 2,320 /uL Final     Comment:     via [facility] (imported 07Oct2022)   09/04/2019 556 510 - 2,320 /uL Final     Comment:     via [facility] (imported 07Oct2022)   - last viral load: undetectable 06/20/20  - full return labs today    Cabenuva Discussion  During the visit today, I discussed with the patient the risks and benefits of Cabenuva treatment and the schedule of injections. she verbalized understanding of this discussion and would like to pursue treatment every two months.  Current ART regimen: DTG/3TC  I confirm that the patient fulfills the eligibility requirements below:  HIV positive: yes  Suppressed viral load for at least 3 months: Yes  Last viral load on 06/20/20, repeating today  Prior tolerance to INSTI and NNRTI: Unknown - obtaining genotype results from [facility] 2009  No prior virologic failures (no prior INSTI or NNRTI resistance):  No  Do not have active hepatitis B virus infection, unless also receiving an oral HBV active regimen:  No  Are not pregnant or planning to become pregnant:  No  Agrees to be seen for monthly or bimonthly injections:  Yes  Patient education includes:  Importance of consistently attending injection appointments and follow up appointments  Having reliable transportation for appointments   Having reliable way to communicate with clinic and for clinic staff to be able to contact patient if need be  Willingness to receive one moderate volume injection in each gluteal each treatment visit  Discussed any upcoming travel or consistent travel (as for work, etc.)  Discussed any anticipated changes to insurance/coverage plan.  Agrees to sign texting agreement and enroll in MyChart in order to facilitate communication and appointment reminders.  Opting for oral lead-in?  No  Has been on LAI treatment in the past?  No    HCV Infection w/ Stage III Fibrosis: Took 12 weeks of Mavyret earlier this year, but has not yet had lab test to document SVR. No signs of cirrhosis on exam. H/o concerning liver lesion and resection on 04/30/20, but turned out to be benign hemangioma/  - Hep C RNA for SVR today.  - Order surveillance [place] at next visit.    Facial Rash, H/o Eczema: rash x4 months, seems to be worse in the morning after her facial cleansing routine and taking her Dovato. Has been on triamcinolone ointment for years for her eczema but still has itchiness.  - Referral to [facility]    Hypertension - on HCTZ. Following with PCP. Elevated in clinic today.    Anxiety/Depression - long term issue. Last provider's name in [place] was Dr. Fredricka Bonine. Transitioning medications.  -  On Wellbutrin and Latuda  - Tapering off quetiapine  - Scheduled with new provider in this area on Nov 7 (STEP)     Health Maintenance  - TB screening: Quant gold neg 06/01/18  - last pap smear: s/p hysterectomy with cervix removed.  - last mammogram: 01/21/21 BIRADS 1  - LDL: 77 03/11/20 (with elevated triglycerides at 369 and low HDL of 30)  - A1c 4.95 03/10/20  - hepatitis A IgG: reactive 06/11/2006  - hepatitis B S Ab: Reactive 06/01/2016  - hepatitis C: see above problem  - GC/CT obtained today from oropharynx and urine  - last RPR: 06/20/20 NR, repeat today  - immunizations:   Immunization History   Administered Date(s) Administered    HEPATITIS B VACCINE ADULT,IM(ENERGIX B, RECOMBIVAX) 06/26/2008, 09/11/2008, 02/24/2009    Hepatitis B Vaccine, Unspecified Formulation 06/26/2008, 09/11/2008, 02/24/2009    Influenza Vaccine Quad (IIV4 PF) 6mo+ injectable 03/06/2019, 02/04/2021    Influenza Virus Vaccine, unspecified formulation 03/10/2011, 04/03/2019, 01/08/2020    Pneumococcal Conjugate 20-valent 02/04/2021    TdaP 03/10/2011       Prevention, adherence, and health education:  - adherence to medications discussed  - disclosure to friends/family discussed  - mental health treatment in progress  - HIV transmission risks discussed, esp U=U    Follow-up:  Return to clinic in 6 months, but sooner if no NNSTI resistance so can start LAI.    To do next visit:   - F/u LAI      Duanne Limerick, MD, MHS  Assistant Professor, [facility] Division of Infectious Diseases  Pager: [phone]      [facility]   [address], 5th floor  [place], [place] [address]  Phone: [phone]  FaxL: [phone]    Subjective:     Chief Complaint: follow-up of HIV    HPI: 51yo woman with HIV, HCV s/p treatment with Mavyret w/ stage III fibrosis, s/p wedge resection of liver (path with calcified hemangioma and not HCC) migraines, bipolar 1, depression, anxiety, OSA on CPAP, COPD, HTN, and chronic pain who presents to establish care after moving to the area. Previously seen by Dr. Daiva Eves at [facility] in [address] (records available in [facility]). Last office visit 01/2021.    Returns for routine follow-up today. Since last visit, started on [place] - first injection 02/12/21. Subsequent injections 03/16/21, 05/20/21, 07/24/21,     Established with psychiatry, started on prazosin and tizanidine, prazosin eventually d/c'd. [place] information provided given concern for not feeling safe at home. Spouse abandoned her and her daugther in Feb 2023, taking the car and refusing to pay bills. Working with [facility] team. Currently living on friend's couch with her daughter***    Past Medical History:   Diagnosis Date    Anemia 2010    Anxiety 2014    Arthritis 2019    Clotting disorder (CMS-HCC) 2005    Depression 2009    HIV disease (CMS-HCC) 2009    Hypertension 2014    Neuromuscular disorder (CMS-HCC) 2022    Obesity 2022   HIV History  Diagnosed with HIV in 2009 while she was pregnant with her 3rd child. Acquired from female partner at the time. Married to a new partner currently since 2011 who is HIV negative. They use condoms most of the time. He is a long-distance truck driver so is only home twice a month - she states their relationship is more distant than it used to be. Has 3 children 12, 26,  and 29. Stays at home to care for her 49 year old. Not currently working. Says she keeps to herself most of the time.    No history of OIs. Previously on Kaletra/Truvada then [employer], now [employer]. Has been virally suppressed since starting on therapy. No missed doses.      Current Outpatient Medications   Medication Sig Dispense Refill    acetaminophen (TYLENOL) 500 MG tablet       albuterol HFA 90 mcg/actuation inhaler Inhale 2 puffs every six (6) hours as needed.      baclofen (LIORESAL) 10 MG tablet Take 10 mg by mouth.      buPROPion (WELLBUTRIN XL) 150 MG 24 hr tablet Take 150 mg by mouth daily.      cabotegravir-rilpivirine (CABENUVA) 600 mg-900 mg/3 mL extended-release injection Inject 6 mL into the muscle every thirty (30) days for 2 doses. 12 mL 0    cabotegravir-rilpivirine (CABENUVA) 600 mg-900 mg/3 mL extended-release injection Inject 6 mL into the muscle every 8 weeks. 6 mL 3    diclofenac sodium (VOLTAREN) 1 % gel Apply 2 g topically.      FLUoxetine (PROZAC) 20 MG capsule Take 1 capsule (20 mg total) by mouth daily. 30 capsule 2    fluticasone propionate (FLOVENT HFA) 110 mcg/actuation inhaler Inhale 2 puffs at bedtime.      hydroCHLOROthiazide (HYDRODIURIL) 25 MG tablet Take 1 tablet (25 mg total) by mouth in the morning. 90 tablet 0    hydrocortisone 2.5 % ointment Apply 1 application topically Two (2) times a day. As needed to affected areas of face only when flaring. 30 g 1    hydrOXYzine (VISTARIL) 25 MG capsule Take 1 capsule (25 mg total) by mouth Three (3) times a day as needed. 270 capsule 0    ketoconazole (NIZORAL) 2 % cream Apply 1 application topically Two (2) times a day. Twice a day to affected areas of face. Ok to use a few times a week for maintenance when clear. 30 g 11    levocetirizine (XYZAL) 5 MG tablet       lurasidone (LATUDA) 20 mg Tab Take 1 tablet (20 mg total) by mouth daily with lunch. 30 tablet 2    lurasidone (LATUDA) 20 mg Tab Take 20 mg by mouth.      onabotulinumtoxinA (BOTOX) 200 unit SolR injection       ondansetron (ZOFRAN) 4 MG tablet TAKE 1 TABLET BY MOUTH EVERY DAY AS NEEDED FOR NAUSEA      oxyCODONE (ROXICODONE) 5 MG immediate release tablet Take 5 mg by mouth Three (3) times a day as needed.      prazosin (MINIPRESS) 1 MG capsule TAKE 1 CAPSULE BY MOUTH NIGHTLY 30 capsule 2    pregabalin (LYRICA) 75 MG capsule Take 1 capsule by mouth Two (2) times a day.      TiZANidine (ZANAFLEX) 4 MG capsule Take 2 capsules (8 mg total) by mouth nightly as needed for muscle spasms. 60 capsule 2    traZODone (DESYREL) 50 MG tablet Take 100 mg by mouth.      triamcinolone (KENALOG) 0.5 % ointment Apply 1 application topically two (2) times a day as needed.      zonisamide (ZONEGRAN) 100 MG capsule Take 1 capsule (100 mg total) by mouth daily. 90 capsule 0     No current facility-administered medications for this visit.     Allergies: Naproxen sodium and Naproxen    Social History:   General: Lives in [place] with her daughter (and sometimes her 26yo son). Husband lives there as well but travels for work.   Tobacco: Former smoker  Alcohol: Occasional    Sexual History: Sexually active with one female partner - her husband    Reproductive: Has had 3 children as described in HPI. Diagnosed with HIV during 3rd pregnancy    Substance use: Denies illicit drug use.       Review of Systems: 12 systems reviewed and negative in detail except as noted in the HPI above.    Objective:   LMP 10/02/2017     CONSTITUTIONAL: alert, well-appearing in no acute distress  EYES: anicteric conjunctivae, EOMI  ENT: moist mucous membranes, no oral lesions  RESPIRATORY: good inspiratory effort, clear bilaterally   CARDIOVASCULAR: regular rhythm without murmur  GASTROINTESTINAL: normal bowel sounds, soft without tenderness  SKIN: scattered mildly erythematous papules in T zone of face.  MSK: normal ROM in all extremities, no edema, no joint effusions   NEUROLOGICAL: no nuchal rigidity, alert and oriented x3  PSYCH: affect congruent with stated mood    Labs:  Absolute CD4 Count   Date Value Ref Range Status   02/04/2021 1,125 510 - 2,320 /uL Final   06/20/2020 1,500 510 - 2,320 /uL Final     Comment:     via [facility] (imported 07Oct2022)   03/11/2020 914 510 - 2,320 /uL Final     Comment:     via [facility] (imported 07Oct2022)   09/04/2019 556 510 - 2,320 /uL Final     Comment:     via [facility] (imported 07Oct2022)     CD4% (T [place])   Date Value Ref Range Status   02/04/2021 45 34 - 58 % Final   06/20/2020 46 34 - 58 % Final     Comment:     via [facility] (imported 07Oct2022)   03/11/2020 48 34 - 58 % Final     Comment:     via [facility] (imported 07Oct2022)   09/04/2019 50 34 - 58 % Final     Comment:     via [facility] (imported 07Oct2022)     HIV RNA Quant Result   Date Value Ref Range Status   02/04/2021 Not Detected Not Detected Final   06/20/2020 Not Detected Not Detected Final     Comment:     via [facility] (imported 07Oct2022)   06/20/2020 Not Detected Not Detected Final     Comment:     via [facility] (imported 07Oct2022)   09/04/2019 Not Detected Not Detected Final     Comment:     via [facility] (imported 07Oct2022)     HIV RNA   Date Value Ref Range Status   06/01/2018 5,030 copies/mL Final     Comment:     via [facility] (imported 07Oct2022)      Lab Results   Component Value Date    WBC 4.9 02/04/2021    Hemoglobin A1C 4.9 03/10/2020    Platelet 162 02/04/2021    Creatinine 0.77 02/04/2021    AST 28 02/04/2021    ALT 22 02/04/2021

## 2021-09-24 NOTE — Unmapped (Signed)
LM, sent text, and My Chart message to reschedule with Dr. Kathaleen Grinder.

## 2021-09-25 ENCOUNTER — Other Ambulatory Visit: Payer: Self-pay | Admitting: Surgery

## 2021-09-25 DIAGNOSIS — B1920 Unspecified viral hepatitis C without hepatic coma: Secondary | ICD-10-CM

## 2021-09-27 DIAGNOSIS — Z21 Asymptomatic human immunodeficiency virus [HIV] infection status: Principal | ICD-10-CM

## 2021-09-29 ENCOUNTER — Other Ambulatory Visit: Payer: Self-pay | Admitting: Neurology

## 2021-09-30 ENCOUNTER — Ambulatory Visit: Admit: 2021-09-30 | Discharge: 2021-10-01 | Payer: BLUE CROSS/BLUE SHIELD

## 2021-09-30 DIAGNOSIS — Z21 Asymptomatic human immunodeficiency virus [HIV] infection status: Principal | ICD-10-CM

## 2021-09-30 LAB — CBC W/ AUTO DIFF
BASOPHILS ABSOLUTE COUNT: 0 10*9/L (ref 0.0–0.1)
BASOPHILS RELATIVE PERCENT: 0.8 %
EOSINOPHILS ABSOLUTE COUNT: 0.1 10*9/L (ref 0.0–0.5)
EOSINOPHILS RELATIVE PERCENT: 1.3 %
HEMATOCRIT: 41.4 % (ref 34.0–44.0)
HEMOGLOBIN: 13.6 g/dL (ref 11.3–14.9)
LYMPHOCYTES ABSOLUTE COUNT: 2.7 10*9/L (ref 1.1–3.6)
LYMPHOCYTES RELATIVE PERCENT: 48.6 %
MEAN CORPUSCULAR HEMOGLOBIN CONC: 32.8 g/dL (ref 32.0–36.0)
MEAN CORPUSCULAR HEMOGLOBIN: 26.8 pg (ref 25.9–32.4)
MEAN CORPUSCULAR VOLUME: 81.7 fL (ref 77.6–95.7)
MEAN PLATELET VOLUME: 9.6 fL (ref 6.8–10.7)
MONOCYTES ABSOLUTE COUNT: 0.4 10*9/L (ref 0.3–0.8)
MONOCYTES RELATIVE PERCENT: 6.7 %
NEUTROPHILS ABSOLUTE COUNT: 2.3 10*9/L (ref 1.8–7.8)
NEUTROPHILS RELATIVE PERCENT: 42.6 %
NUCLEATED RED BLOOD CELLS: 0 /100{WBCs} (ref <=4)
PLATELET COUNT: 210 10*9/L (ref 150–450)
RED BLOOD CELL COUNT: 5.06 10*12/L (ref 3.95–5.13)
RED CELL DISTRIBUTION WIDTH: 14 % (ref 12.2–15.2)
WBC ADJUSTED: 5.5 10*9/L (ref 3.6–11.2)

## 2021-09-30 LAB — BASIC METABOLIC PANEL
ANION GAP: 5 mmol/L (ref 5–14)
BLOOD UREA NITROGEN: 12 mg/dL (ref 9–23)
BUN / CREAT RATIO: 13
CALCIUM: 9.8 mg/dL (ref 8.7–10.4)
CHLORIDE: 103 mmol/L (ref 98–107)
CO2: 29.7 mmol/L (ref 20.0–31.0)
CREATININE: 0.92 mg/dL — ABNORMAL HIGH
EGFR CKD-EPI (2021) FEMALE: 77 mL/min/{1.73_m2} (ref >=60)
GLUCOSE RANDOM: 78 mg/dL (ref 70–179)
POTASSIUM: 3.5 mmol/L (ref 3.4–4.8)
SODIUM: 138 mmol/L (ref 135–145)

## 2021-09-30 LAB — ALT: ALT (SGPT): 31 U/L (ref 10–49)

## 2021-09-30 LAB — BILIRUBIN, TOTAL: BILIRUBIN TOTAL: 0.6 mg/dL (ref 0.3–1.2)

## 2021-09-30 LAB — AST: AST (SGOT): 37 U/L — ABNORMAL HIGH (ref <=34)

## 2021-09-30 MED ADMIN — cabotegravir-rilpivirine (CABENUVA) 600 mg-900 mg/3 mL extended-release injection 6 mL: 6 mL | INTRAMUSCULAR | @ 21:00:00 | Stop: 2021-09-30

## 2021-09-30 NOTE — Unmapped (Signed)
It was good to see you today. I've sent a message to our SW team asking them to reach out to you today regarding any housing resources. I will also follow-up the results of your ultrasound through [facility].
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
## 2021-10-01 ENCOUNTER — Other Ambulatory Visit: Payer: Self-pay | Admitting: Surgery

## 2021-10-01 ENCOUNTER — Inpatient Hospital Stay: Admission: RE | Admit: 2021-10-01 | Payer: Medicaid Other | Source: Ambulatory Visit

## 2021-10-01 DIAGNOSIS — K432 Incisional hernia without obstruction or gangrene: Secondary | ICD-10-CM

## 2021-10-01 LAB — LYMPH MARKER LIMITED,FLOW
ABSOLUTE CD3 CNT: 1890 {cells}/uL (ref 915–3400)
ABSOLUTE CD4 CNT: 1215 {cells}/uL (ref 510–2320)
ABSOLUTE CD8 CNT: 648 {cells}/uL (ref 180–1520)
CD3% (T CELLS): 70 % (ref 61–86)
CD4% (T HELPER): 45 % (ref 34–58)
CD4:CD8 RATIO: 1.9 (ref 0.9–4.8)
CD8% T SUPPRESR: 24 % (ref 12–38)

## 2021-10-01 NOTE — Unmapped (Signed)
Patient here for Cabenuva injections. Denies changes in mood or sleep related to Cabenuva. Tolerated well. Next appointment made for 11/30/2021.

## 2021-10-05 ENCOUNTER — Other Ambulatory Visit: Payer: Self-pay | Admitting: Surgery

## 2021-10-05 DIAGNOSIS — K769 Liver disease, unspecified: Secondary | ICD-10-CM

## 2021-10-05 DIAGNOSIS — Z21 Asymptomatic human immunodeficiency virus [HIV] infection status: Principal | ICD-10-CM

## 2021-10-06 ENCOUNTER — Other Ambulatory Visit: Payer: Self-pay | Admitting: Surgery

## 2021-10-06 DIAGNOSIS — K769 Liver disease, unspecified: Secondary | ICD-10-CM

## 2021-10-07 NOTE — Unmapped (Signed)
[facility] Administered Medication Refill Coordination Note      NAME:Kelly Cervantes DOB: 25-Aug-1970      Medication: Cabenuva    Day Supply: 56 days      SHIPPING      Next delivery from [facility] [phone]) to [facility] for [name] is scheduled for n/a.    Clinic contact: BJ Turner    Patient's next nurse visit for administration: 08/07-will follow up with clinic in 1 month .    We will follow up with clinic monthly for standard refill processing and delivery.      Johnesha Acheampong Samella Parr  Specialty Pharmacy Technician

## 2021-10-08 ENCOUNTER — Inpatient Hospital Stay: Admission: RE | Admit: 2021-10-08 | Payer: Medicaid Other | Source: Ambulatory Visit

## 2021-10-08 ENCOUNTER — Other Ambulatory Visit: Payer: Medicaid Other

## 2021-10-16 NOTE — Unmapped (Signed)
Referral Service Attempt    Duration of Intervention: 10 minutes    TYPE OF CONTACT: Phone    SW attempted to follow up on provider referral for [facility] for Housing     Pt non-responsive to outreach. Left voicemail to return call.  This is attempt one of two to contact the patient.     Lennell Shanks, MSW  [facility] Social Work

## 2021-10-25 DIAGNOSIS — Z21 Asymptomatic human immunodeficiency virus [HIV] infection status: Principal | ICD-10-CM

## 2021-11-04 DIAGNOSIS — F419 Anxiety disorder, unspecified: Principal | ICD-10-CM

## 2021-11-04 MED ORDER — HYDROXYZINE HCL 25 MG TABLET
ORAL_TABLET | Freq: Four times a day (QID) | ORAL | 0 refills | 30 days | Status: CP | PRN
Start: 2021-11-04 — End: 2021-11-04

## 2021-11-04 MED ORDER — HYDROCHLOROTHIAZIDE 25 MG TABLET
ORAL_TABLET | Freq: Every day | ORAL | 0 refills | 90 days | Status: CP
Start: 2021-11-04 — End: 2022-02-02

## 2021-11-04 MED ORDER — HYDROXYZINE PAMOATE 25 MG CAPSULE
ORAL_CAPSULE | Freq: Three times a day (TID) | ORAL | 2 refills | 30 days | Status: CP | PRN
Start: 2021-11-04 — End: 2022-02-02

## 2021-11-04 NOTE — Unmapped (Signed)
Covering for primary psychiatry MD Dr. Bartolo Darter. Refilling prescription for hydroxyzine 25 mg TID PRN for 90 days. Has upcoming appointment with Dr. Bartolo Darter later in July.

## 2021-11-04 NOTE — Unmapped (Signed)
[facility] Administered Medication Refill Coordination Note      NAME:Kelly Cervantes DOB: [birth year]      Medication: Cabenuva    Day Supply: 56 days      SHIPPING      Next delivery from [facility] [phone]) to  [facility]  for Kelly Cervantes is scheduled for 07/20.    Clinic contact: BJ Turner    Patient's next nurse visit for administration: 08/07.    We will follow up with clinic monthly for standard refill processing and delivery.      Kelly Cervantes  Specialty Pharmacy Technician

## 2021-11-04 NOTE — Unmapped (Deleted)
[facility] Specialty Pharmacy Refill Coordination Note    Specialty Medication(s) to be Shipped:   {specpharm:59087}    Other medication(s) to be shipped: {Blank:19197::***,No additional medications requested for fill at this time}     Kelly Cervantes, DOB: [birth year]  Phone: [phone] (home)       All above HIPAA information was verified with {Blank:19197::patient.,patient's caregiver, ***,patient's family member, ***.}     Was a Nurse, learning disability used for this call? {Blank single:19197::Yes, ***. Patient language is appropriate in WAM,No}    Completed refill call assessment today to schedule patient's medication shipment from the [facility] [phone]).  All relevant notes have been reviewed.     Specialty medication(s) and dose(s) confirmed: {Blank:19197::Regimen is correct and unchanged.,Patient reports changes to the regimen as follows: ***}   Changes to medications: {Blank:19197::[name] reports starting the following medications: ***,[name] reports stopping the following medications: ***,[name] reports no changes at this time.}  Changes to insurance: {Blank:19197::Yes: ***,No}  New side effects reported not previously addressed with a pharmacist or physician: {sscrefillsideeffects:78475}  Questions for the pharmacist: {Blank:19197::Yes: ***,No}    Confirmed patient received a Conservation officer, historic buildings and a Surveyor, mining with first shipment. The patient will receive a drug information handout for each medication shipped and additional FDA Medication Guides as required.       DISEASE/MEDICATION-SPECIFIC INFORMATION        {clinicspecificinstructions:59274}    SPECIALTY MEDICATION ADHERENCE              Were doses missed due to medication being on hold? {Blank:19197::No,Yes - ***}    *** *** {Blank:19197::mg,mg/ml,***}: *** days of medicine on hand   *** *** {Blank:19197::mg,mg/ml,***}: *** days of medicine on hand   *** *** {Blank:19197::mg,mg/ml,***}: *** days of medicine on hand   *** *** {Blank:19197::mg,mg/ml,***}: *** days of medicine on hand   *** *** {Blank:19197::mg,mg/ml,***}: *** days of medicine on hand     REFERRAL TO PHARMACIST     Referral to the pharmacist: {SSCRefertoRPH:77899}      SHIPPING     Shipping address confirmed in Epic.     Delivery Scheduled: {Blank:19197::Yes, Expected medication delivery date: ***.,Yes, Expected medication delivery date: ***.  However, Rx request for refills was sent to the provider as there are none remaining.,Patient declined refill at this time due to ***.,No, cannot schedule delivery at this time as there are outstanding items that need addressed.  This note has been handed off to the provider for follow up.,Due to patient insurance changes, unable to fill at [facility], please route Rx to *** specialty pharmacy}     Medication will be delivered via {Blank:19197::UPS,Next Day Courier,Same Day Courier,Clinic Courier - *** clinic,***} to the {Blank:19197::prescription,temporary} address in Epic WAM.    Kelly Cervantes   [facility] Specialty {Blank:19197::Pharmacist,Technician}

## 2021-11-11 MED FILL — CABENUVA 600 MG/3 ML-900 MG/3 ML IM SUSPENSION, EXTENDED RELEASE: INTRAMUSCULAR | 34 days supply | Qty: 6 | Fill #2

## 2021-11-13 NOTE — Unmapped (Signed)
LM, and sent this text, and My Chart message;  [facility]: Kelly Cervantes, we weren't able to reach you via phone call. Your appointment on 9/13th at 12:30 has been canceled due to the provider schedule change.  Your visit has been rescheduled with 9/13th at 3:40. If this change does not work for your schedule, please give our office a call at [phone]. Thank you, we apologize for the inconvenience.

## 2021-11-16 ENCOUNTER — Telehealth: Admit: 2021-11-16 | Discharge: 2021-11-17 | Payer: BLUE CROSS/BLUE SHIELD
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
## 2021-11-17 MED ORDER — TIZANIDINE 4 MG CAPSULE
ORAL_CAPSULE | Freq: Every evening | ORAL | 0 refills | 30 days | Status: CP | PRN
Start: 2021-11-17 — End: ?

## 2021-11-17 MED ORDER — FLUOXETINE 20 MG CAPSULE
ORAL_CAPSULE | Freq: Every day | ORAL | 2 refills | 30 days | Status: CP
Start: 2021-11-17 — End: 2022-02-15
  Filled 2022-01-14: qty 60, 30d supply, fill #0

## 2021-11-18 DIAGNOSIS — F419 Anxiety disorder, unspecified: Principal | ICD-10-CM

## 2021-11-18 MED ORDER — HYDROXYZINE PAMOATE 25 MG CAPSULE
ORAL_CAPSULE | Freq: Three times a day (TID) | ORAL | 1 refills | 0 days | PRN
Start: 2021-11-18 — End: ?

## 2021-11-18 NOTE — Unmapped (Signed)
Addended by: Ulla Potash L on: 11/17/2021 05:52 PM     Modules accepted: Level of Service

## 2021-11-30 NOTE — Unmapped (Signed)
Noticed [place] was not on schedule for Cabenuva appt. Patient cancelled. No future appt set.   Called Jules. She stated she no longer lives in [place], now in [place] so over an hour drive to get to clinic. Unsure if she will be able to continue Cabenuva. Medicaid was how she got her rides to appt before.   Notified Destiny Little SW. Destiny to reach out to [place].

## 2021-12-03 MED ORDER — HYDROXYZINE PAMOATE 25 MG CAPSULE
ORAL_CAPSULE | Freq: Three times a day (TID) | ORAL | 0 refills | 90 days | Status: CP | PRN
Start: 2021-12-03 — End: 2022-03-03

## 2021-12-09 DIAGNOSIS — Z21 Asymptomatic human immunodeficiency virus [HIV] infection status: Principal | ICD-10-CM

## 2021-12-09 MED ORDER — DOVATO 50 MG-300 MG TABLET
ORAL_TABLET | Freq: Every day | ORAL | 3 refills | 0 days | Status: CP
Start: 2021-12-09 — End: ?
  Filled 2021-12-11: qty 30, 30d supply, fill #0

## 2021-12-09 NOTE — Unmapped (Unsigned)
Call back in regards to cabenuva

## 2021-12-09 NOTE — Unmapped (Signed)
Unable to find transportation to clinic for patient to receive Cabenuva dose on time, so placed order for Dovato - sent to [facility] so it can be shipped to her house.
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
## 2021-12-10 NOTE — Unmapped (Signed)
[facility] Specialty Medication Onboarding    Specialty Medication: DOVATO 50-300 mg Tab (dolutegravir-lamivudine)  Prior Authorization: Not Required   Financial Assistance: No - copay  <$25  Final Copay/Day Supply: $4 / 30    Insurance Restrictions: None     Notes to Pharmacist:     The triage team has completed the benefits investigation and has determined that the patient is able to fill this medication at [facility]. Please contact the patient to complete the onboarding or follow up with the prescribing physician as needed.

## 2021-12-14 ENCOUNTER — Telehealth: Admit: 2021-12-14 | Discharge: 2021-12-15 | Payer: BLUE CROSS/BLUE SHIELD

## 2021-12-14 MED ORDER — TIZANIDINE 4 MG CAPSULE
ORAL_CAPSULE | Freq: Every evening | ORAL | 0 refills | 30 days | Status: CP | PRN
Start: 2021-12-14 — End: ?
  Filled 2022-01-14: qty 30, 30d supply, fill #0

## 2021-12-27 IMAGING — MR MR ABDOMEN WO/W CM
18 series · 47 of 48 positions shown · IV contrast (7.5ml Gadavist)
Comparison: CT scan 05/06/2019

CLINICAL DATA: Chronic liver disease. Hepatitis C screening.
Elevated AFP level.

EXAM:
MRI ABDOMEN WITHOUT AND WITH CONTRAST
TECHNIQUE: Multiplanar multisequence MR imaging of the abdomen was performed
both before and after the administration of intravenous contrast.
CONTRAST:  7.5mL GADAVIST GADOBUTROL 1 MMOL/ML IV SOLN

[Series 2: T2 · coronal · 6.0mm · 1.19mm/px · 2 of 30 slices shown (1 of 2)]
[im 1/30]
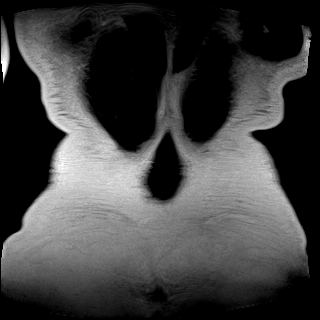
[im 30/30]
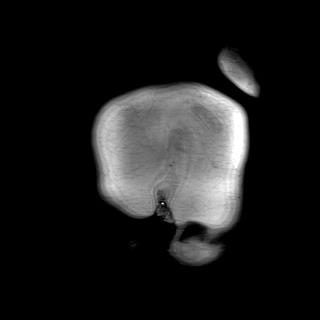

[Series 3: T2 · axial · 6.0mm · 1.19mm/px · z∈[-50,+173]mm · 2 of 32 slices shown (2 of 2)]
[im 1/32]
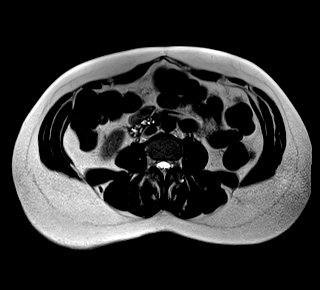
[im 32/32]
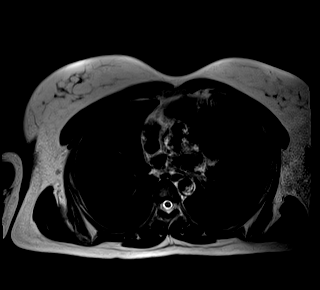

[Series 5: T2 fat-sat · axial · 6.0mm · 1.19mm/px · z∈[-59,+179]mm · 2 of 34 slices shown]
[im 1/34]
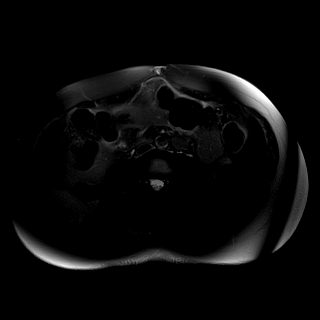
[im 34/34]
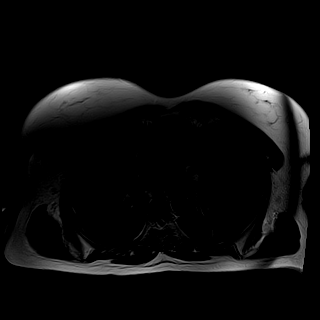

[Series 6: ax dwi_tracew · axial · 6.0mm · 1.42mm/px · z∈[-59,+179]mm · 5 of 102 slices shown]
[im 1/102]
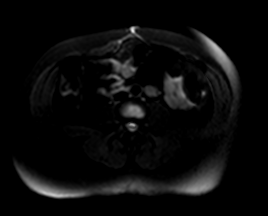
[im 26/102]
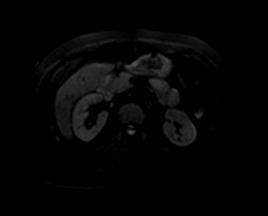
[im 51/102]
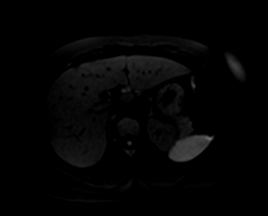
[im 76/102]
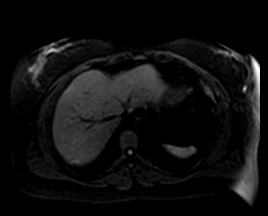
[im 102/102]
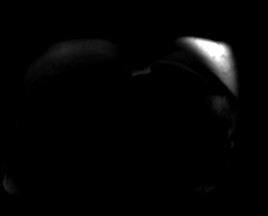

[Series 7: ax dwi_adc · axial · 6.0mm · 1.42mm/px · z∈[-59,+179]mm · 2 of 34 slices shown]
[im 1/34]
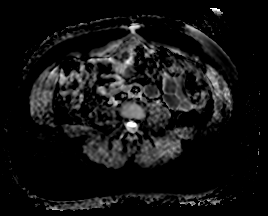
[im 34/34]
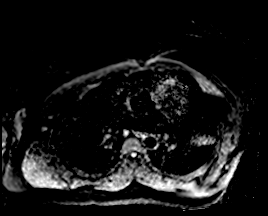

[Series 8: T1 · axial · 6.0mm · 0.74mm/px · z∈[-50,+173]mm · 2 of 32 slices shown (1 of 2)]
[im 1/32]
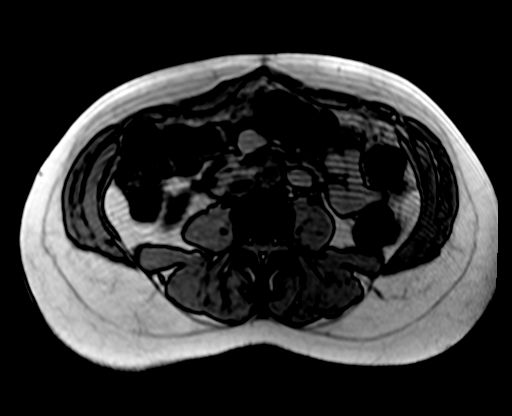
[im 32/32]
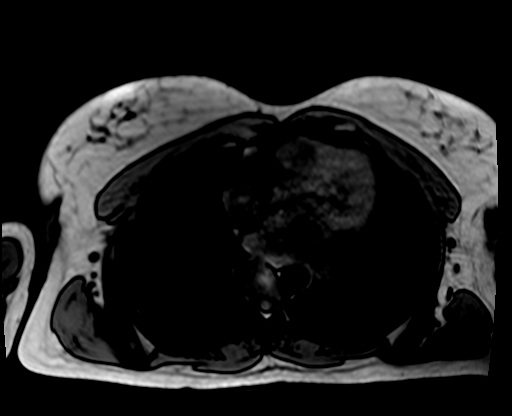

[Series 8: T1 · axial · 6.0mm · 0.74mm/px · z∈[-50,+173]mm · 2 of 32 slices shown (2 of 2)]
[im 1/32]
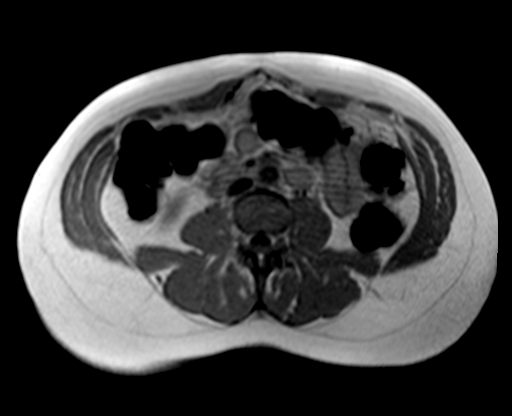
[im 32/32]
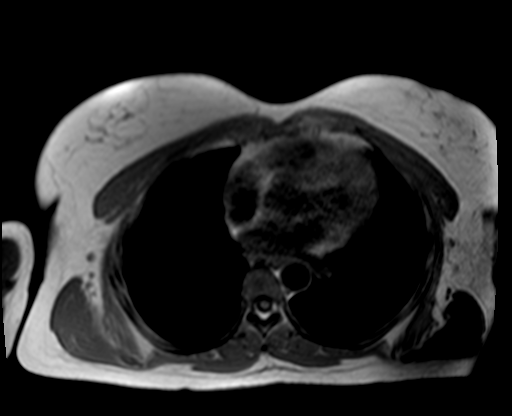

[Series 9: bSSFP · axial · 6.0mm · 0.74mm/px · 1 of 32 slices shown]
[im 1/32]
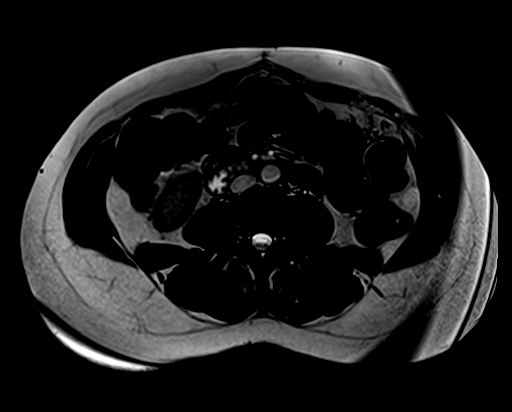

[Series 10: T1 dynamic fat-sat · axial · non-contrast · 3.0mm · 1.19mm/px · z∈[-64,+173]mm · 3 of 80 slices shown (1 of 5)]
[im 1/80]
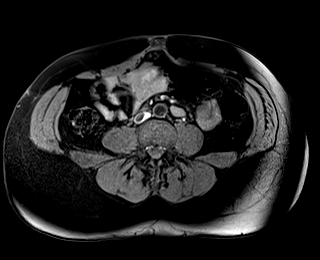
[im 40/80]
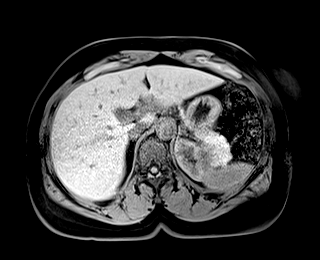
[im 80/80]
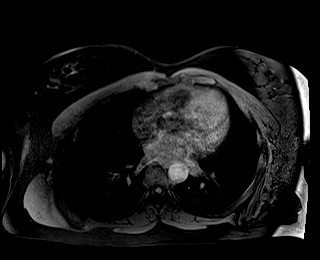

[Series 11: T1 dynamic fat-sat post-contrast · axial · 3.0mm · 1.19mm/px · z∈[-64,+173]mm · 3 of 80 slices shown (1 of 4)]
[im 1/80]
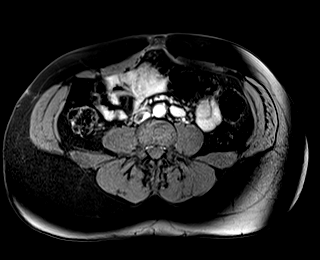
[im 40/80]
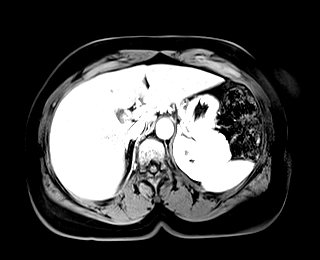
[im 80/80]
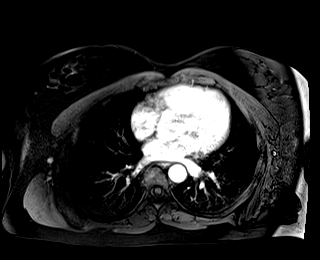

[Series 12: T1 dynamic fat-sat · axial · 3.0mm · 1.19mm/px · z∈[-64,+173]mm · 3 of 80 slices shown (2 of 5)]
[im 1/80]
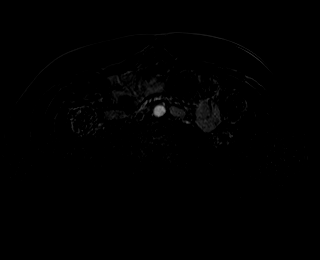
[im 40/80]
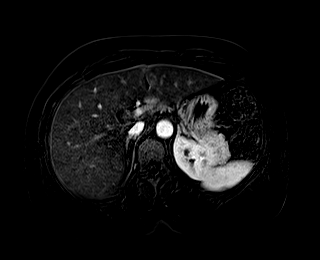
[im 80/80]
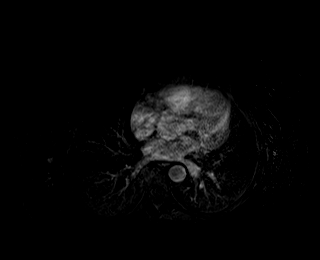

[Series 13: T1 dynamic fat-sat post-contrast · axial · 3.0mm · 1.19mm/px · z∈[-64,+173]mm · 3 of 80 slices shown (2 of 4)]
[im 1/80]
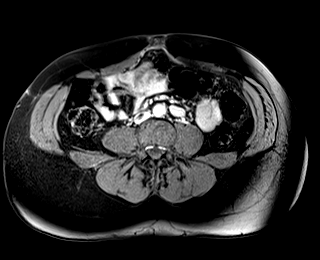
[im 40/80]
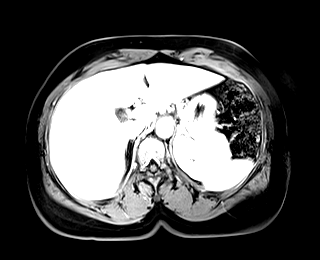
[im 80/80]
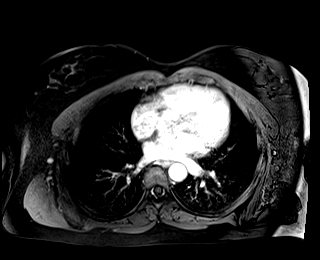

[Series 14: T1 dynamic fat-sat · axial · 3.0mm · 1.19mm/px · z∈[-64,+173]mm · 3 of 80 slices shown (3 of 5)]
[im 1/80]
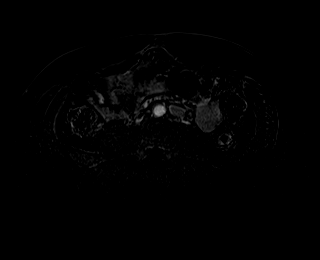
[im 40/80]
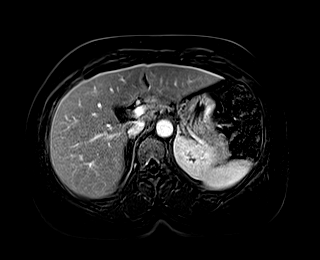
[im 80/80]
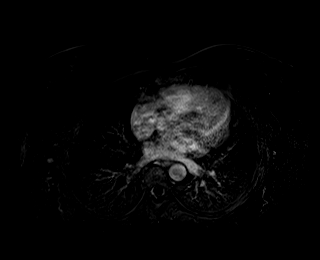

[Series 15: T1 dynamic fat-sat post-contrast · axial · 3.0mm · 1.19mm/px · z∈[-64,+173]mm · 3 of 80 slices shown (3 of 4)]
[im 1/80]
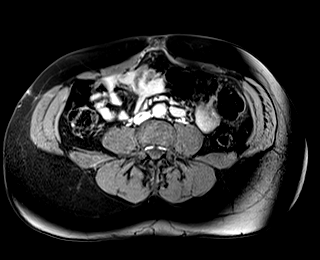
[im 40/80]
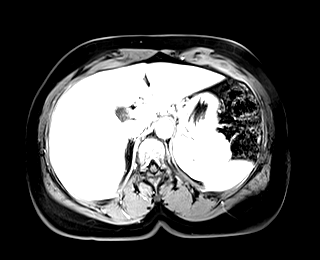
[im 80/80]
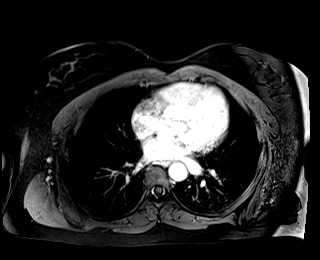

[Series 16: T1 dynamic fat-sat · axial · 3.0mm · 1.19mm/px · z∈[-64,+173]mm · 3 of 80 slices shown (4 of 5)]
[im 1/80]
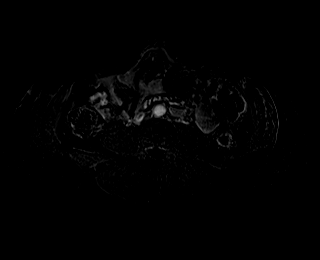
[im 40/80]
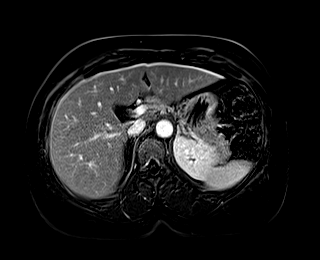
[im 80/80]
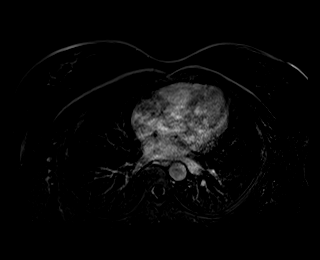

[Series 17: T1 dynamic post-contrast · coronal · 3.0mm · 1.31mm/px · 3 of 72 slices shown]
[im 1/72]
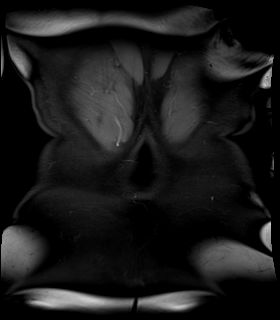
[im 36/72]
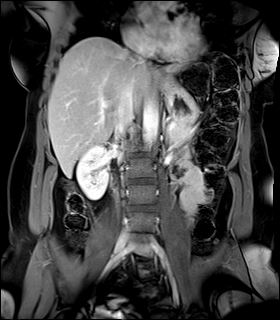
[im 72/72]
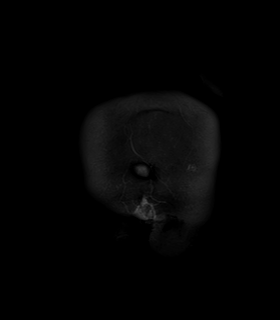

[Series 18: T1 dynamic fat-sat post-contrast · axial · 3.0mm · 1.19mm/px · z∈[-64,+173]mm · 3 of 80 slices shown (4 of 4)]
[im 1/80]
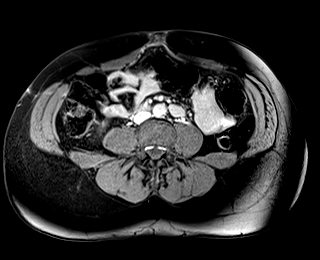
[im 40/80]
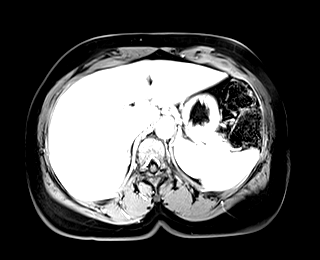
[im 80/80]
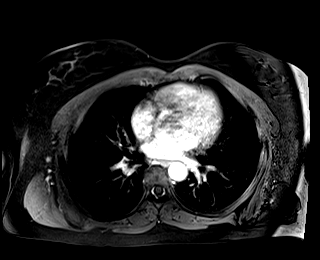

[Series 19: T1 dynamic fat-sat · axial · 3.0mm · 1.19mm/px · z∈[-64,+53]mm · 2 of 80 slices shown (5 of 5)]
[im 1/80]
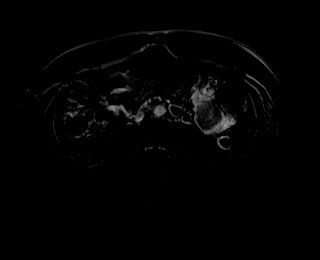
[im 40/80]
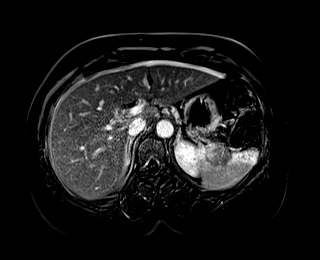

[47 of 48 positions shown; findings below may reference images not displayed]

FINDINGS: Lower chest: The lung bases are clear of an acute process. No
worrisome pulmonary lesions. No pleural or pericardial effusion.

Hepatobiliary: Small simple hepatic cyst noted at the hepatic dome
and segment 7. No worrisome hepatic lesions or intrahepatic biliary
dilatation. On the early arterial phase postcontrast images there
are 3 small peripheral vascular shunts. No worrisome enhancing
lesions to suggest dysplastic nodules or HCC. I do not see any MR
morphologic features of cirrhosis involving the liver. No evidence
of portal venous hypertension, portal venous collaterals or
splenomegaly.

Pancreas: No mass, inflammation or ductal dilatation. Normal caliber
and course of the main pancreatic duct.

Spleen:  Normal size. No focal lesions.

Adrenals/Urinary Tract: The adrenal glands and kidneys are normal.
Small peripheral cortical defect involving the right kidney
inferiorly is likely an old infarct or old focus of infection.

Stomach/Bowel: The stomach, duodenum, visualized small bowel and
visualized colon are unremarkable.

Vascular/Lymphatic: The aorta and branch vessels are patent. The
major venous structures are patent. No mesenteric or retroperitoneal
mass or adenopathy.

Other:  No ascites or abdominal wall hernia.

Musculoskeletal: No significant bony findings.
IMPRESSION: 1. Unremarkable MR appearance of the liver. No MR features of
cirrhosis. No worrisome hepatic lesions or intrahepatic biliary
dilatation.
2. No evidence of portal venous hypertension, portal venous
collaterals or splenomegaly.
3. No acute abdominal findings, mass lesions or adenopathy.

## 2022-01-06 MED FILL — DOVATO 50 MG-300 MG TABLET: ORAL | 30 days supply | Qty: 30 | Fill #1

## 2022-01-14 MED ORDER — LIDOCAINE 5 % TOPICAL OINTMENT
Freq: Three times a day (TID) | TOPICAL | 0 refills | 0 days | Status: CP | PRN
Start: 2022-01-14 — End: 2022-04-14

## 2022-01-14 MED ORDER — OXYCODONE 10 MG TABLET
ORAL_TABLET | Freq: Three times a day (TID) | ORAL | 0 refills | 4 days | PRN
Start: 2022-01-14 — End: ?

## 2022-01-14 MED ORDER — BACLOFEN 10 MG TABLET
ORAL_TABLET | Freq: Three times a day (TID) | ORAL | 0 refills | 90 days | Status: CP
Start: 2022-01-14 — End: 2022-04-14
  Filled 2022-03-31: qty 90, 30d supply, fill #0

## 2022-01-14 MED ORDER — HYDROCHLOROTHIAZIDE 25 MG TABLET
ORAL_TABLET | Freq: Every day | ORAL | 0 refills | 90 days | Status: CP
Start: 2022-01-14 — End: 2022-04-14

## 2022-01-14 MED ORDER — FLUTICASONE PROPIONATE 110 MCG/ACTUATION HFA AEROSOL INHALER
Freq: Every evening | RESPIRATORY_TRACT | 11 refills | 0 days | Status: CP
Start: 2022-01-14 — End: 2023-01-09

## 2022-01-14 MED ORDER — HYDROXYZINE HCL 25 MG TABLET
ORAL_TABLET | Freq: Three times a day (TID) | ORAL | 0 refills | 30.00000 days | Status: CP | PRN
Start: 2022-01-14 — End: 2022-02-13

## 2022-01-14 MED ORDER — DICLOFENAC 1 % TOPICAL GEL
Freq: Four times a day (QID) | TOPICAL | 3 refills | 90 days | Status: CP
Start: 2022-01-14 — End: 2023-01-09

## 2022-01-17 DIAGNOSIS — Z21 Asymptomatic human immunodeficiency virus [HIV] infection status: Principal | ICD-10-CM

## 2022-01-22 MED ORDER — PRAZOSIN 1 MG CAPSULE
ORAL_CAPSULE | 0 refills | 0 days
Start: 2022-01-22 — End: ?

## 2022-01-27 ENCOUNTER — Ambulatory Visit: Admit: 2022-01-27 | Payer: BLUE CROSS/BLUE SHIELD

## 2022-01-28 MED ORDER — PRAZOSIN 1 MG CAPSULE
ORAL_CAPSULE | 0 refills | 0 days
Start: 2022-01-28 — End: ?

## 2022-02-06 DIAGNOSIS — Z21 Asymptomatic human immunodeficiency virus [HIV] infection status: Principal | ICD-10-CM

## 2022-02-09 MED FILL — DOVATO 50 MG-300 MG TABLET: ORAL | 30 days supply | Qty: 30 | Fill #2

## 2022-02-11 ENCOUNTER — Ambulatory Visit: Admit: 2022-02-11 | Discharge: 2022-02-12 | Payer: BLUE CROSS/BLUE SHIELD

## 2022-02-11 DIAGNOSIS — M25521 Pain in right elbow: Principal | ICD-10-CM

## 2022-02-12 MED ORDER — HYDROXYZINE HCL 25 MG TABLET
ORAL_TABLET | 0 refills | 0 days | Status: CP
Start: 2022-02-12 — End: ?

## 2022-03-08 ENCOUNTER — Telehealth: Admit: 2022-03-08 | Discharge: 2022-03-09 | Payer: BLUE CROSS/BLUE SHIELD

## 2022-03-15 MED ORDER — TIZANIDINE 4 MG CAPSULE
ORAL_CAPSULE | Freq: Every evening | ORAL | 0 refills | 30 days | Status: CN | PRN
Start: 2022-03-15 — End: ?

## 2022-03-17 MED FILL — DOVATO 50 MG-300 MG TABLET: ORAL | 30 days supply | Qty: 30 | Fill #3

## 2022-03-17 MED FILL — FLUOXETINE 20 MG CAPSULE: ORAL | 30 days supply | Qty: 60 | Fill #1

## 2022-04-05 ENCOUNTER — Ambulatory Visit: Admit: 2022-04-05 | Payer: BLUE CROSS/BLUE SHIELD

## 2022-04-06 DIAGNOSIS — Z21 Asymptomatic human immunodeficiency virus [HIV] infection status: Principal | ICD-10-CM

## 2022-04-06 MED ORDER — DOVATO 50 MG-300 MG TABLET
ORAL_TABLET | Freq: Every day | ORAL | 3 refills | 30 days
Start: 2022-04-06 — End: ?

## 2022-04-07 MED ORDER — DOVATO 50 MG-300 MG TABLET
ORAL_TABLET | Freq: Every day | ORAL | 3 refills | 30.00000 days
Start: 2022-04-07 — End: ?

## 2022-04-09 DIAGNOSIS — Z21 Asymptomatic human immunodeficiency virus [HIV] infection status: Principal | ICD-10-CM

## 2022-04-12 MED ORDER — DOVATO 50 MG-300 MG TABLET
ORAL_TABLET | Freq: Every day | ORAL | 3 refills | 30 days | Status: CP
Start: 2022-04-12 — End: ?
  Filled 2022-04-12: qty 30, 30d supply, fill #0

## 2022-04-12 MED FILL — FLUOXETINE 20 MG CAPSULE: ORAL | 30 days supply | Qty: 60 | Fill #2

## 2022-04-23 MED ORDER — HYDROXYZINE HCL 25 MG TABLET
ORAL_TABLET | 0 refills | 0 days
Start: 2022-04-23 — End: ?

## 2022-04-24 MED ORDER — HYDROXYZINE HCL 25 MG TABLET
ORAL_TABLET | Freq: Four times a day (QID) | ORAL | 0 refills | 30 days | Status: CP | PRN
Start: 2022-04-24 — End: ?
  Filled 2022-05-10: qty 120, 30d supply, fill #0

## 2022-04-27 MED FILL — HYDROCHLOROTHIAZIDE 25 MG TABLET: ORAL | 90 days supply | Qty: 90 | Fill #0

## 2022-04-27 MED FILL — BACLOFEN 10 MG TABLET: ORAL | 30 days supply | Qty: 90 | Fill #1

## 2022-04-29 ENCOUNTER — Emergency Department
Admission: EM | Admit: 2022-04-29 | Discharge: 2022-04-29 | Disposition: A | Discharge status: Home / Self Care | Payer: Medicare Other | Attending: Emergency Medicine | Admitting: Emergency Medicine

## 2022-04-29 ENCOUNTER — Other Ambulatory Visit: Payer: Self-pay

## 2022-04-29 ENCOUNTER — Encounter: Payer: Self-pay | Admitting: Emergency Medicine

## 2022-04-29 DIAGNOSIS — R519 Headache, unspecified: Secondary | ICD-10-CM | POA: Insufficient documentation

## 2022-04-29 DIAGNOSIS — Z21 Asymptomatic human immunodeficiency virus [HIV] infection status: Secondary | ICD-10-CM | POA: Insufficient documentation

## 2022-04-29 DIAGNOSIS — J45909 Unspecified asthma, uncomplicated: Secondary | ICD-10-CM | POA: Diagnosis not present

## 2022-04-29 HISTORY — DX: Migraine, unspecified, not intractable, without status migrainosus: G43.909

## 2022-04-29 MED ORDER — PROCHLORPERAZINE EDISYLATE 10 MG/2ML IJ SOLN
10.0000 mg | Freq: Once | INTRAMUSCULAR | Status: AC
  Administered 2022-04-29: 10 mg via INTRAVENOUS
  Filled 2022-04-29: qty 2

## 2022-04-29 MED ORDER — ACETAMINOPHEN 500 MG PO TABS: 1000.0000 mg | ORAL_TABLET | Freq: Four times a day (QID) | ORAL | 2 refills | Status: AC | PRN

## 2022-04-29 MED ORDER — KETOROLAC TROMETHAMINE 15 MG/ML IJ SOLN
15.0000 mg | Freq: Once | INTRAMUSCULAR | Status: AC
  Administered 2022-04-29: 15 mg via INTRAVENOUS
  Filled 2022-04-29: qty 1

## 2022-04-29 MED ORDER — MELOXICAM 15 MG PO TABS: 15.0000 mg | ORAL_TABLET | Freq: Every day | ORAL | 2 refills | Status: AC

## 2022-04-29 NOTE — Discharge Instructions (Addendum)
-  Please schedule an appointment with the neurologist listed above if you do not hear back from your neurologist.  -You may take the meloxicam and acetaminophen as needed for your headaches.  -Return to the emergency department anytime if you begin to experience any new or worsening symptoms.

## 2022-04-29 NOTE — ED Triage Notes (Signed)
C/O headache x 23 days.  Normally takes Imitrex other medications for headaches, not working.  Seen at Urgent care for same.  AAOx3.  Skin warm and dry. NAD

## 2022-04-29 NOTE — ED Provider Notes (Signed)
[facility] Provider Note    Event Date/Time   First MD Initiated Contact with Patient 04/29/22 1229     (approximate)   History   Chief Complaint Headache   HPI Teresa [name] is a 52 y.o. female, history of bipolar affective disorder, endometriosis, asthma, depression, pancreatitis, anxiety, HIV, presents to the emergency department for evaluation of headaches.  Patient states that she has been having a persistent migraine for the past 23 days.  She says that it comes and goes throughout the day.  She has been previously diagnosed with cluster headaches.  She normally takes sumatriptan hand for her symptoms, however is not helping.  She states that it is mostly consistent with her previous headaches.  Denies any recent falls or injuries.  Denies any new medications.  Denies fever/chills, neck stiffness, chest pain, shortness of breath, abdominal pain, nausea/vomiting, diarrhea, dizziness/lightheadedness, vertigo, or weakness.  History Limitations: No limitations.        Physical Exam  Triage Vital Signs: ED Triage Vitals  Enc Vitals Group     BP 04/29/22 1202 (!) 149/95     Pulse Rate 04/29/22 1202 85     Resp 04/29/22 1202 16     Temp 04/29/22 1202 97.8 F (36.6 C)     Temp Source 04/29/22 1202 Oral     SpO2 04/29/22 1202 97 %     Weight 04/29/22 1201 190 lb 0.6 oz (86.2 kg)     Height 04/29/22 1201 '5\' 7"'$  (1.702 m)     Head Circumference --      Peak Flow --      Pain Score 04/29/22 1200 10     Pain Loc --      Pain Edu? --      Excl. in [place]? --     Most recent vital signs: Vitals:   04/29/22 1202 04/29/22 1304  BP: (!) 149/95 135/81  Pulse: 85 74  Resp: 16 18  Temp: 97.8 F (36.6 C)   SpO2: 97% 100%    General: Awake, NAD.  Skin: Warm, dry. No rashes or lesions.  Eyes: PERRL. Conjunctivae normal.  CV: Good peripheral perfusion.  Resp: Normal effort.  Abd: Soft, non-tender. No distention.  Neuro: At baseline. No gross  neurological deficits.  5/5 strength and sensation in both upper and lower extremities.  No facial droop. Musculoskeletal: Normal ROM of all extremities.  No nuchal rigidity.  Physical Exam    ED Results / Procedures / Treatments  Labs (all labs ordered are listed, but only abnormal results are displayed) Labs Reviewed - No data to display   EKG N/A.    RADIOLOGY  ED Provider Interpretation: N/A.  No results found.  PROCEDURES:  Critical Care performed: N/A.  Procedures    MEDICATIONS ORDERED IN ED: Medications  prochlorperazine (COMPAZINE) injection 10 mg (10 mg Intravenous Given 04/29/22 1257)  ketorolac (TORADOL) 15 MG/ML injection 15 mg (15 mg Intravenous Given 04/29/22 1254)     IMPRESSION / MDM / ASSESSMENT AND PLAN / ED COURSE  I reviewed the triage vital signs and the nursing notes.                              Differential diagnosis includes, but is not limited to, viral headache, tension headache, cluster headache, neuritis, hypertensive urgency, trigeminal neuralgia,  ED Course Patient appears well, vitals within normal limits.  Afebrile.  Current endorsing moderate to severe  headache.  Will initiate Compazine, ketorolac, and oxygen.  Assessment/Plan Presentation consistent with migraine/cluster headache.  Very low suspicion for any serious or life-threatening pathology based on history and physical examination.  Following treatment with Compazine, ketorolac, and oxygen via nasal cannula, she states that her symptoms have markedly improved.  She states that she feels well for discharge.  Will provide her with a prescription for meloxicam and acetaminophen.  Recommend that she follow-up with her neurologist.  She states that she has not heard anything back from her neurologist recently, will provide her with a referral to one of ours.  She was amenable to this.  Will discharge.  Considered admission for this patient, but given her stable presentation and  access to follow-up, she is unlikely benefit from admission.  Provided the patient with anticipatory guidance, return precautions, and educational material. Encouraged the patient to return to the emergency department at any time if they begin to experience any new or worsening symptoms. Patient expressed understanding and agreed with the plan.   Patient's presentation is most consistent with acute, uncomplicated illness.       FINAL CLINICAL IMPRESSION(S) / ED DIAGNOSES   Final diagnoses:  Nonintractable headache, unspecified chronicity pattern, unspecified headache type     Rx / DC Orders   ED Discharge Orders          Ordered    meloxicam (MOBIC) 15 MG tablet  Daily        04/29/22 1337    acetaminophen (TYLENOL) 500 MG tablet  Every 6 hours PRN        04/29/22 1337             Note:  This document was prepared using Dragon voice recognition software and may include unintentional dictation errors.   Teodoro Spray, [place] 04/29/22 1344    Delman Kitten, MD 04/29/22 [phone]

## 2022-05-03 ENCOUNTER — Telehealth: Admit: 2022-05-03 | Discharge: 2022-05-04 | Payer: BLUE CROSS/BLUE SHIELD

## 2022-05-03 MED ORDER — HYDROXYZINE HCL 25 MG TABLET
ORAL_TABLET | Freq: Four times a day (QID) | ORAL | 0 refills | 30 days | PRN
Start: 2022-05-03 — End: ?

## 2022-05-05 MED ORDER — TIZANIDINE 4 MG CAPSULE
ORAL_CAPSULE | Freq: Every evening | ORAL | 0 refills | 30 days | PRN
Start: 2022-05-05 — End: ?

## 2022-05-06 MED ORDER — TIZANIDINE 4 MG CAPSULE
ORAL_CAPSULE | Freq: Every evening | ORAL | 0 refills | 30 days | Status: CP | PRN
Start: 2022-05-06 — End: ?
  Filled 2022-05-10: qty 30, 30d supply, fill #0

## 2022-05-10 MED FILL — DICLOFENAC 1 % TOPICAL GEL: TOPICAL | 25 days supply | Qty: 200 | Fill #0

## 2022-05-13 ENCOUNTER — Ambulatory Visit: Payer: Medicare Other | Attending: Family

## 2022-05-13 DIAGNOSIS — M7711 Lateral epicondylitis, right elbow: Secondary | ICD-10-CM | POA: Insufficient documentation

## 2022-05-13 DIAGNOSIS — M25521 Pain in right elbow: Secondary | ICD-10-CM | POA: Diagnosis not present

## 2022-05-13 MED FILL — DOVATO 50 MG-300 MG TABLET: ORAL | 30 days supply | Qty: 30 | Fill #1

## 2022-05-13 NOTE — Therapy (Signed)
[facility] [address], [place], [address] Phone: [phone]   Fax:  [phone]  Physical Therapy Evaluation  Patient Details  Name: Teresa [name] MRN: [id number] Date of Birth: 1970-11-07 Referring Provider (PT): Georgian Co, FNP   Encounter Date: 05/13/2022   PT End of Session - 05/13/22 1456     Visit Number 1    Number of Visits 17    Date for PT Re-Evaluation 07/08/22    PT Start Time 1501    PT Stop Time 9381    PT Time Calculation (min) 44 min    Activity Tolerance Patient tolerated treatment well    Behavior During Therapy [facility] for tasks assessed/performed             Past Medical History:  Diagnosis Date   Abdominal pain 06/20/2020   Anemia 2020   iron infusions x 2   Anxiety    Arthritis    Neck, left shoulder   Asthma    uses inhaler daily   Bipolar disorder ([place])    Depression    Dyspnea    with exertion   Eczema    GERD (gastroesophageal reflux disease)    Headache    Migraines  botox treatments   Hepatitis 2009   Hep C had treatment   HIV infection ([place])    Hypertension 2017   Migraine    Neuromuscular disorder ([place])    pinch nerve, rt back   Ovarian cyst 03/24/2020   Pancreatitis    Pruritus 03/11/2020   Rash 09/18/2019   Seasonal allergies    Sleep apnea 08/2020   with C-Pap   SVD (spontaneous vaginal delivery)    x 1    Past Surgical History:  Procedure Laterality Date   ABDOMINAL HYSTERECTOMY     CESAREAN SECTION     x2   DILATION AND CURETTAGE OF UTERUS     x 2 - TAB   HAND SURGERY Right    2 pins in wrist   HYSTEROSCOPY N/A 10/11/2016   Procedure: HYSTEROSCOPY WITH HYDROTHERMAL ABLATION;  Surgeon: Donnamae Jude, MD;  Location: [facility];  Service: Gynecology;  Laterality: N/A;  Caryl Pina the HTA rep will be here.  Confirmed on 09/02/16.    LAPAROSCOPIC BILATERAL SALPINGECTOMY Bilateral 07/11/2018   Procedure: LAPAROSCOPIC BILATERAL  SALPINGECTOMY;  Surgeon: Ward, Honor Loh, MD;  Location: [facility];  Service: Gynecology;  Laterality: Bilateral;   LAPAROSCOPIC HYSTERECTOMY N/A 07/11/2018   Procedure: HYSTERECTOMY TOTAL LAPAROSCOPIC;  Surgeon: Ward, Honor Loh, MD;  Location: [facility];  Service: Gynecology;  Laterality: N/A;   LAPAROSCOPIC TUBAL LIGATION Bilateral 10/11/2016   Procedure: LAPAROSCOPIC TUBAL LIGATION WITH FILSHIE CLIPS;  Surgeon: Donnamae Jude, MD;  Location: [facility];  Service: Gynecology;  Laterality: Bilateral;   LAPAROSCOPY N/A 04/30/2020   Procedure: LAPAROSCOPY DIAGNOSTIC;  Surgeon: [name], MD;  Location: [facility];  Service: General;  Laterality: N/A;   MOUTH SURGERY     OPEN PARTIAL HEPATECTOMY  Right 04/30/2020   Procedure: OPEN PARTIAL RIGHT HEPATECTOMY;  Surgeon: [name], MD;  Location: [facility];  Service: General;  Laterality: Right;   ROBOTIC ASSISTED LAPAROSCOPIC OVARIAN CYSTECTOMY Left 05/04/2019   Procedure: XI ROBOTIC ASSISTED LAPAROSCOPIC OVARIAN CYSTECTOMY;  Surgeon: Ward, Honor Loh, MD;  Location: [facility];  Service: Gynecology;  Laterality: Left;   tumor biopsy   jan 5th 2022   UMBILICAL HERNIA REPAIR N/A 07/11/2018   Procedure: LAPAROSCOPIC UMBILICAL HERNIA  REPAIR;  Surgeon: Benjamine Sprague, DO;  Location: [facility];  Service: General;  Laterality: N/A;   VENTRAL HERNIA REPAIR N/A 12/11/2020   Procedure: DIAGNOSTIC LAPAROSCOPY, LYSIS OF ADHESIONS;  Surgeon: [name], MD;  Location: [facility];  Service: General;  Laterality: N/A;   WISDOM TOOTH EXTRACTION      There were no vitals filed for this visit.    Subjective Assessment - 05/13/22 1505     Subjective R elbow joint: 3/10 currently. 8/10 at most for the past 3 months.    Pertinent History R elbow pain. R elbow joint pain. Has had the pain for about 4 years. Always had joint aches (knees, wrist, elbow). Pt took a tumble, slipped on some steps 2 months ago as she was going down the stairs and landed backwards onto her R elbow.  Used lidocaine, salon pas which did not help. Had x-ray for R elbow.    Patient Stated Goals Be able to pick up grocery bags and trash more comfortably. Pick up something heavier more comfortably.    Currently in Pain? Yes    Pain Score 3     Pain Location Elbow    Pain Orientation Right    Pain Descriptors / Indicators Burning;Tightness;Throbbing    Pain Type Chronic pain    Pain Onset More than a month ago    Pain Frequency Occasional    Aggravating Factors  Cleaning, cold weather, picking up stuff with one hand (feels like she twisted something), pulling something,    Pain Relieving Factors squeezing her R elbow, heating pad and lidocain used to help. Ibuprofen helps some.                [facility] PT Assessment - 05/13/22 1516       Assessment   Medical Diagnosis M25.521 (ICD-10-CM) - Right elbow pain    Referring Provider (PT) Georgian Co, FNP    Onset Date/Surgical Date 02/15/22   Date PT referral signed. Chronic condition.   Hand Dominance Right      Precautions   Precaution Comments No known precautions      Restrictions   Other Position/Activity Restrictions No known restrictions      Posture/Postural Control   Posture Comments forward neck, movement preference around C6/7, and C3/4 area, B protracted shoulders, increased lumbar lordosis,      AROM   Cervical Flexion Full    Cervical Extension WFL with symptoms along the R ulnar nerve/C8 dermatome area    Cervical - Right Side Bend WFL    Cervical - Left Side Bend WFL    Cervical - Right Rotation WFL    Cervical - Left Rotation [facility]      Strength   Right Shoulder Flexion 4/5    Right Shoulder ABduction 4/5    Right Shoulder Internal Rotation 4/5    Right Shoulder External Rotation 4/5    Right Elbow Flexion 4/5    Right Elbow Extension 4+/5    Right Wrist Flexion 4-/5   with R 5th digit pulling sensation   Right Wrist Extension 4+/5   No pain   Right Wrist Radial Deviation 4+/5    Right Wrist Ulnar  Deviation 4/5      Palpation   Palpation comment TTP R wrist extensor origin; good P to A mobility to proximal radial head. Good medial to lateral mobility to R elbow.  Objective measurements completed on examination: See above findings.       No latex allergies  Blood pressure is controlled.   Pt is R hand dominant  Reproduced R elbow symptoms with resisted R wrist extension and radial deviation.   TTP R wrist extensor origin (around extensor carpi radialis)  Manual therapy  Cross friction R lateral elbow. Reviewed and given as part of her home program (2-5 minutes at a time for the next week). Pt verbalized understanding    Therapeutic exercise  Eccentric R wrist extension red band 10x  Pt was recommended to use counter force strap for her R forearm to help decrease stress to extensor tendons.    Improved exercise technique, movement at target joints, use of target muscles after mod verbal, visual, tactile cues.     Response to treatment Pt tolerated session well without aggravation of symptoms.    Clinical impression Pt is a 52 year old female who came to physical therapy secondary to R elbow pain. She also presents with TTP to R extensor muscle origin, reproduction of symptoms with resisted R wrist extension with radial deviation, R UE weakness, altered posture, and difficulty performing tasks which involve pulling, picking up items with her R hand, as well as performing chores secondary to pain. Pt will benefit form skilled physical therapy services to address the aforementioned deficits.            Home exercise program:  Access Code: [email or web address] URL: [email or web address] Date: 05/13/2022 Prepared by: Joneen Boers  Exercises - Eccentric Wrist Extension with Resistance  - 1 x daily - 7 x weekly - 1-3 sets - 10 reps                     PT Education - 05/13/22 1711     Education  Details ther-ex, HEP, POC    Person(s) Educated Patient    Methods Explanation;Demonstration;Tactile cues;Verbal cues;Handout    Comprehension Verbalized understanding;Returned demonstration              PT Short Term Goals - 05/13/22 1651       PT SHORT TERM GOAL #1   Title Pt will be independent with her initial HEP to decrease pain, improve strength and ability to pick up items, pull, perform chores more comfortably.    Baseline Pt has started her initial HEP (05/13/2022)    Time 3    Period Weeks    Status New    Target Date 06/03/22               PT Long Term Goals - 05/13/22 1654       PT LONG TERM GOAL #1   Title Pt will have a decrease in R elbow pain to 4/10 or less at worst to promote ability to pick up items, pull, and perform chores more comfortably.    Baseline 8/10 R elbow pain at worst for the past 3 months (05/13/2022)    Time 8    Period Weeks    Status New    Target Date 07/08/22      PT LONG TERM GOAL #2   Title Pt will improve her R elbow FOTO score by at least 10 points as a demonstration of improved function.    Baseline Elbow FOTO 53 (05/13/2022)    Time 8    Period Weeks    Status New    Target Date 07/08/22      PT LONG TERM GOAL #3  Title Pt will report being able to pick up grocery bags with her R hand, pick up the trash without R elbow pain to promote ability to perform functional tasks at home.    Baseline R elbow pain with pickin up grocery bags, and trash (05/13/2022)    Time 8    Period Weeks    Status New    Target Date 07/08/22                    Plan - 05/13/22 1642     Clinical Impression Statement Pt is a 52 year old female who came to physical therapy secondary to R elbow pain. She also presents with TTP to R extensor muscle origin, reproduction of symptoms with resisted R wrist extension with radial deviation, R UE weakness, altered posture, and difficulty performing tasks which involve pulling, picking up items  with her R hand, as well as performing chores secondary to pain. Pt will benefit form skilled physical therapy services to address the aforementioned deficits.    Personal Factors and Comorbidities Comorbidity 3+;Past/Current Experience;Time since onset of injury/illness/exacerbation;Fitness    Comorbidities anxiety, arthritis, bipolar disorder, depression, hepatitis C, HIV infection, HTN    Examination-Activity Limitations Lift;Carry;Other   pulling   Stability/Clinical Decision Making Evolving/Moderate complexity   Lidocain and heating pad no longer helping   Clinical Decision Making Moderate    Rehab Potential Fair    PT Frequency 2x / week    PT Duration 8 weeks    PT Treatment/Interventions Therapeutic exercise;Therapeutic activities;Neuromuscular re-education;Patient/family education;Manual techniques;Electrical Stimulation;Iontophoresis '4mg'$ /ml Dexamethasone    PT Next Visit Plan isometric, concentric, eccentric loading to wrist extensor muscles, scapular strengthening, manual techniques, modalities PRN    PT Home Exercise Plan Medbridge Access Code: [email or web address]    Consulted and Agree with Plan of Care Patient             Patient will benefit from skilled therapeutic intervention in order to improve the following deficits and impairments:  Pain, Postural dysfunction, Improper body mechanics, Decreased strength  Visit Diagnosis: Pain in right elbow - Plan: PT plan of care cert/re-cert  Lateral epicondylitis of right elbow - Plan: PT plan of care cert/re-cert     Problem List Patient Active Problem List   Diagnosis Date Noted   Seasonal and perennial allergic rhinoconjunctivitis 09/08/2020   Abdominal pain 06/20/2020   Reactive airway disease 06/09/2020   Liver hemangioma 05/07/2020   Shortness of breath 04/02/2020   Ovarian cyst 03/24/2020   Rash 09/18/2019   Hypertensive urgency 05/24/2019   Asthma    Depression    Pelvic pain in female 05/16/2019   Arthralgia  04/02/2019   HTN, goal below 130/80 06/05/2018   Iron deficiency anemia due to chronic blood loss 03/25/2014   Abnormal uterine bleeding (AUB) 03/12/2014   Bipolar affective disorder ([place]) 04/13/2012   Endometriosis 04/13/2012   Migraine headache 04/13/2012   Hepatitis C infection 03/10/2011   HIV disease ([place]) 03/10/2011   Joneen Boers PT, DPT  05/13/2022, 5:15 PM  [facility] [address], [place], [address] Phone: [phone]   Fax:  [phone]  Name: Teresa [name] MRN: [id number] Date of Birth: 12-22-1970

## 2022-05-16 MED ORDER — FLUOXETINE 20 MG CAPSULE
ORAL_CAPSULE | Freq: Every day | ORAL | 2 refills | 30 days
Start: 2022-05-16 — End: 2022-08-14

## 2022-05-17 ENCOUNTER — Ambulatory Visit: Payer: Medicare Other

## 2022-05-17 MED ORDER — FLUOXETINE 20 MG CAPSULE
ORAL_CAPSULE | Freq: Every day | ORAL | 2 refills | 30 days | Status: CP
Start: 2022-05-17 — End: 2022-08-15
  Filled 2022-05-20: qty 90, 30d supply, fill #0

## 2022-05-19 ENCOUNTER — Ambulatory Visit: Payer: Medicare Other

## 2022-05-20 MED FILL — BACLOFEN 10 MG TABLET: ORAL | 30 days supply | Qty: 90 | Fill #2

## 2022-05-20 MED FILL — FLUTICASONE PROPIONATE 110 MCG/ACTUATION HFA AEROSOL INHALER: RESPIRATORY_TRACT | 30 days supply | Qty: 12 | Fill #0

## 2022-05-24 ENCOUNTER — Ambulatory Visit: Payer: Medicare Other

## 2022-05-24 ENCOUNTER — Telehealth: Payer: Self-pay

## 2022-05-24 NOTE — Telephone Encounter (Signed)
No show. Called patient and left a message pertaining to appointment and a reminder for the next follow up session. Return phone call requested. Phone number ([phone]) provided.   

## 2022-05-26 ENCOUNTER — Telehealth: Payer: Self-pay

## 2022-05-26 ENCOUNTER — Ambulatory Visit: Payer: Medicare Other

## 2022-05-26 NOTE — Telephone Encounter (Signed)
No show. Called patient and left a message pertaining to appointment and a reminder for the next follow up session. Return phone call requested. Phone number ([phone]) provided.   

## 2022-05-31 ENCOUNTER — Telehealth: Admit: 2022-05-31 | Discharge: 2022-05-31 | Payer: MEDICARE

## 2022-06-01 ENCOUNTER — Ambulatory Visit: Payer: 59 | Attending: Family

## 2022-06-03 ENCOUNTER — Other Ambulatory Visit: Payer: Self-pay | Admitting: Family

## 2022-06-03 ENCOUNTER — Ambulatory Visit: Payer: 59

## 2022-06-03 DIAGNOSIS — K2100 Gastro-esophageal reflux disease with esophagitis, without bleeding: Secondary | ICD-10-CM

## 2022-06-08 ENCOUNTER — Ambulatory Visit: Payer: 59

## 2022-06-10 MED ORDER — BACLOFEN 10 MG TABLET
ORAL_TABLET | Freq: Three times a day (TID) | ORAL | 0 refills | 90 days
Start: 2022-06-10 — End: 2022-09-08

## 2022-06-10 MED ORDER — TIZANIDINE 4 MG CAPSULE
ORAL_CAPSULE | Freq: Every evening | ORAL | 0 refills | 30 days | PRN
Start: 2022-06-10 — End: ?

## 2022-06-11 ENCOUNTER — Other Ambulatory Visit: Payer: Self-pay | Admitting: Family

## 2022-06-13 MED ORDER — LEVOCETIRIZINE 5 MG TABLET
0 refills | 0 days
Start: 2022-06-13 — End: ?

## 2022-06-13 MED ORDER — BACLOFEN 10 MG TABLET
ORAL_TABLET | Freq: Three times a day (TID) | ORAL | 0 refills | 60 days | Status: CP
Start: 2022-06-13 — End: 2022-08-12
  Filled 2022-06-22: qty 180, 60d supply, fill #0

## 2022-06-13 MED ORDER — HYDROXYZINE HCL 25 MG TABLET
ORAL_TABLET | Freq: Four times a day (QID) | ORAL | 0 refills | 30 days | PRN
Start: 2022-06-13 — End: ?

## 2022-06-14 DIAGNOSIS — G43719 Chronic migraine without aura, intractable, without status migrainosus: Secondary | ICD-10-CM | POA: Diagnosis not present

## 2022-06-14 DIAGNOSIS — Z79899 Other long term (current) drug therapy: Secondary | ICD-10-CM | POA: Diagnosis not present

## 2022-06-14 DIAGNOSIS — Z049 Encounter for examination and observation for unspecified reason: Secondary | ICD-10-CM | POA: Diagnosis not present

## 2022-06-15 MED ORDER — LEVOCETIRIZINE 5 MG TABLET
ORAL_TABLET | Freq: Every evening | ORAL | 1 refills | 90 days | Status: CP
Start: 2022-06-15 — End: 2022-12-12
  Filled 2022-06-29: qty 90, 90d supply, fill #0

## 2022-06-16 MED ORDER — HYDROXYZINE HCL 25 MG TABLET
ORAL_TABLET | Freq: Three times a day (TID) | ORAL | 0 refills | 30 days | PRN
Start: 2022-06-16 — End: 2022-07-16

## 2022-06-17 DIAGNOSIS — G43719 Chronic migraine without aura, intractable, without status migrainosus: Secondary | ICD-10-CM | POA: Diagnosis not present

## 2022-06-17 DIAGNOSIS — M542 Cervicalgia: Secondary | ICD-10-CM | POA: Diagnosis not present

## 2022-06-17 DIAGNOSIS — M791 Myalgia, unspecified site: Secondary | ICD-10-CM | POA: Diagnosis not present

## 2022-06-18 MED ORDER — HYDROXYZINE HCL 25 MG TABLET
ORAL_TABLET | Freq: Three times a day (TID) | ORAL | 0 refills | 30 days | Status: CP | PRN
Start: 2022-06-18 — End: 2022-07-18

## 2022-06-18 MED ORDER — TIZANIDINE 4 MG CAPSULE
ORAL_CAPSULE | Freq: Every evening | ORAL | 0 refills | 30 days | Status: CP | PRN
Start: 2022-06-18 — End: ?
  Filled 2022-06-29: qty 30, 30d supply, fill #0

## 2022-06-21 MED ORDER — HYDROCHLOROTHIAZIDE 25 MG TABLET
ORAL_TABLET | Freq: Every day | ORAL | 0 refills | 90 days
Start: 2022-06-21 — End: 2022-09-19

## 2022-06-21 MED ORDER — HYDROXYZINE HCL 25 MG TABLET
ORAL_TABLET | Freq: Four times a day (QID) | ORAL | 0 refills | 30 days | PRN
Start: 2022-06-21 — End: ?

## 2022-06-22 MED ORDER — HYDROCHLOROTHIAZIDE 25 MG TABLET
ORAL_TABLET | Freq: Every day | ORAL | 0 refills | 60 days | Status: CP
Start: 2022-06-22 — End: 2022-08-21

## 2022-06-28 ENCOUNTER — Telehealth: Admit: 2022-06-28 | Discharge: 2022-06-29 | Payer: MEDICARE

## 2022-06-29 MED FILL — FLUOXETINE 20 MG CAPSULE: ORAL | 30 days supply | Qty: 90 | Fill #1

## 2022-06-29 MED FILL — DOVATO 50 MG-300 MG TABLET: ORAL | 30 days supply | Qty: 30 | Fill #2

## 2022-07-02 DIAGNOSIS — G43719 Chronic migraine without aura, intractable, without status migrainosus: Secondary | ICD-10-CM | POA: Diagnosis not present

## 2022-07-02 DIAGNOSIS — M542 Cervicalgia: Secondary | ICD-10-CM | POA: Diagnosis not present

## 2022-07-02 DIAGNOSIS — M791 Myalgia, unspecified site: Secondary | ICD-10-CM | POA: Diagnosis not present

## 2022-07-16 DIAGNOSIS — M542 Cervicalgia: Secondary | ICD-10-CM | POA: Diagnosis not present

## 2022-07-16 DIAGNOSIS — G43719 Chronic migraine without aura, intractable, without status migrainosus: Secondary | ICD-10-CM | POA: Diagnosis not present

## 2022-07-16 DIAGNOSIS — M791 Myalgia, unspecified site: Secondary | ICD-10-CM | POA: Diagnosis not present

## 2022-07-21 MED ORDER — HYDROXYZINE HCL 25 MG TABLET
ORAL_TABLET | Freq: Four times a day (QID) | ORAL | 0 refills | 30.00000 days | PRN
Start: 2022-07-21 — End: ?

## 2022-07-21 MED ORDER — TIZANIDINE 4 MG CAPSULE
ORAL_CAPSULE | Freq: Every evening | ORAL | 0 refills | 30 days | PRN
Start: 2022-07-21 — End: ?

## 2022-07-21 MED ORDER — ZONISAMIDE 100 MG CAPSULE
ORAL_CAPSULE | Freq: Every day | ORAL | 0 refills | 90.00000 days
Start: 2022-07-21 — End: ?

## 2022-07-28 MED ORDER — TIZANIDINE 4 MG CAPSULE
ORAL_CAPSULE | Freq: Every evening | ORAL | 0 refills | 30 days | Status: CP | PRN
Start: 2022-07-28 — End: ?
  Filled 2022-07-29: qty 30, 30d supply, fill #0

## 2022-07-29 MED ORDER — ZONISAMIDE 100 MG CAPSULE
ORAL_CAPSULE | Freq: Every day | ORAL | 0 refills | 90 days
Start: 2022-07-29 — End: ?

## 2022-07-29 MED FILL — DOVATO 50 MG-300 MG TABLET: ORAL | 30 days supply | Qty: 30 | Fill #3

## 2022-07-29 MED FILL — FLUOXETINE 20 MG CAPSULE: ORAL | 30 days supply | Qty: 90 | Fill #2

## 2022-07-30 DIAGNOSIS — M791 Myalgia, unspecified site: Secondary | ICD-10-CM | POA: Diagnosis not present

## 2022-07-30 DIAGNOSIS — M542 Cervicalgia: Secondary | ICD-10-CM | POA: Diagnosis not present

## 2022-07-30 DIAGNOSIS — G43719 Chronic migraine without aura, intractable, without status migrainosus: Secondary | ICD-10-CM | POA: Diagnosis not present

## 2022-08-01 ENCOUNTER — Other Ambulatory Visit: Payer: Self-pay | Admitting: Family

## 2022-08-01 DIAGNOSIS — K2100 Gastro-esophageal reflux disease with esophagitis, without bleeding: Secondary | ICD-10-CM

## 2022-08-02 MED ORDER — ZONISAMIDE 100 MG CAPSULE
ORAL_CAPSULE | Freq: Every day | ORAL | 0 refills | 90 days
Start: 2022-08-02 — End: ?

## 2022-08-03 MED ORDER — ALBUTEROL SULFATE HFA 90 MCG/ACTUATION AEROSOL INHALER
Freq: Four times a day (QID) | RESPIRATORY_TRACT | 0 refills | 0 days | PRN
Start: 2022-08-03 — End: ?

## 2022-08-04 MED ORDER — ALBUTEROL SULFATE HFA 90 MCG/ACTUATION AEROSOL INHALER
Freq: Four times a day (QID) | RESPIRATORY_TRACT | 0 refills | 0 days | PRN
Start: 2022-08-04 — End: ?

## 2022-08-05 MED FILL — FLUTICASONE PROPIONATE 110 MCG/ACTUATION HFA AEROSOL INHALER: RESPIRATORY_TRACT | 30 days supply | Qty: 12 | Fill #1

## 2022-08-17 DIAGNOSIS — M542 Cervicalgia: Secondary | ICD-10-CM | POA: Diagnosis not present

## 2022-08-17 DIAGNOSIS — G43719 Chronic migraine without aura, intractable, without status migrainosus: Secondary | ICD-10-CM | POA: Diagnosis not present

## 2022-08-17 DIAGNOSIS — M791 Myalgia, unspecified site: Secondary | ICD-10-CM | POA: Diagnosis not present

## 2022-08-19 DIAGNOSIS — Z21 Asymptomatic human immunodeficiency virus [HIV] infection status: Principal | ICD-10-CM

## 2022-08-19 MED ORDER — DOVATO 50 MG-300 MG TABLET
ORAL_TABLET | Freq: Every day | ORAL | 3 refills | 30 days
Start: 2022-08-19 — End: ?

## 2022-08-20 MED ORDER — DOVATO 50 MG-300 MG TABLET
ORAL_TABLET | Freq: Every day | ORAL | 3 refills | 30 days | Status: CP
Start: 2022-08-20 — End: ?
  Filled 2022-08-31: qty 30, 30d supply, fill #0

## 2022-08-24 DIAGNOSIS — G4733 Obstructive sleep apnea (adult) (pediatric): Secondary | ICD-10-CM | POA: Diagnosis not present

## 2022-08-26 MED ORDER — HYDROXYZINE HCL 25 MG TABLET
ORAL_TABLET | 0 refills | 0 days
Start: 2022-08-26 — End: ?

## 2022-09-01 DIAGNOSIS — M542 Cervicalgia: Secondary | ICD-10-CM | POA: Diagnosis not present

## 2022-09-01 DIAGNOSIS — M791 Myalgia, unspecified site: Secondary | ICD-10-CM | POA: Diagnosis not present

## 2022-09-15 ENCOUNTER — Ambulatory Visit: Admit: 2022-09-15 | Discharge: 2022-09-15 | Payer: MEDICARE

## 2022-09-15 DIAGNOSIS — Z5181 Encounter for therapeutic drug level monitoring: Secondary | ICD-10-CM | POA: Diagnosis not present

## 2022-09-15 DIAGNOSIS — Z79899 Other long term (current) drug therapy: Secondary | ICD-10-CM | POA: Diagnosis not present

## 2022-09-15 DIAGNOSIS — I10 Essential (primary) hypertension: Secondary | ICD-10-CM | POA: Diagnosis not present

## 2022-09-15 DIAGNOSIS — Z9189 Other specified personal risk factors, not elsewhere classified: Secondary | ICD-10-CM | POA: Diagnosis not present

## 2022-09-15 DIAGNOSIS — R053 Chronic cough: Secondary | ICD-10-CM | POA: Diagnosis not present

## 2022-09-15 DIAGNOSIS — B20 Human immunodeficiency virus [HIV] disease: Principal | ICD-10-CM

## 2022-09-15 DIAGNOSIS — Z113 Encounter for screening for infections with a predominantly sexual mode of transmission: Principal | ICD-10-CM

## 2022-09-15 DIAGNOSIS — T7431XD Adult psychological abuse, confirmed, subsequent encounter: Principal | ICD-10-CM

## 2022-09-15 DIAGNOSIS — F419 Anxiety disorder, unspecified: Principal | ICD-10-CM

## 2022-09-15 MED ORDER — HYDROCHLOROTHIAZIDE 25 MG TABLET
ORAL_TABLET | Freq: Every day | ORAL | 1 refills | 30.00000 days | Status: CP
Start: 2022-09-15 — End: 2022-09-15

## 2022-09-15 MED ORDER — HYDROXYZINE HCL 25 MG TABLET
ORAL_TABLET | Freq: Three times a day (TID) | ORAL | 0 refills | 60.00000 days | Status: CP | PRN
Start: 2022-09-15 — End: 2022-11-14

## 2022-09-16 MED ORDER — FLUOXETINE 20 MG CAPSULE
ORAL_CAPSULE | Freq: Every day | ORAL | 2 refills | 30 days
Start: 2022-09-16 — End: 2022-12-15

## 2022-09-17 DIAGNOSIS — B20 Human immunodeficiency virus [HIV] disease: Principal | ICD-10-CM

## 2022-09-17 MED ORDER — CABOTEGRAVIR ER 600 MG/3 ML-RILPIVIRINE ER 900 MG/3ML IM SUSPENSION,ER
INTRAMUSCULAR | 6 refills | 56 days | Status: CP
Start: 2022-09-17 — End: ?

## 2022-09-17 MED ORDER — FLUOXETINE 20 MG CAPSULE
ORAL_CAPSULE | Freq: Every day | ORAL | 2 refills | 30 days | Status: CP
Start: 2022-09-17 — End: 2022-12-16
  Filled 2022-10-01: qty 90, 30d supply, fill #0

## 2022-09-22 MED ORDER — CABOTEGRAVIR ER 600 MG/3 ML-RILPIVIRINE ER 900 MG/3ML IM SUSPENSION,ER
INTRAMUSCULAR | 1 refills | 30 days | Status: CP
Start: 2022-09-22 — End: ?

## 2022-10-05 ENCOUNTER — Ambulatory Visit: Admit: 2022-10-05 | Payer: MEDICARE

## 2022-10-05 DIAGNOSIS — M791 Myalgia, unspecified site: Secondary | ICD-10-CM | POA: Diagnosis not present

## 2022-10-05 DIAGNOSIS — G43719 Chronic migraine without aura, intractable, without status migrainosus: Secondary | ICD-10-CM | POA: Diagnosis not present

## 2022-10-05 DIAGNOSIS — M542 Cervicalgia: Secondary | ICD-10-CM | POA: Diagnosis not present

## 2022-10-06 MED ORDER — HYDROXYZINE HCL 25 MG TABLET
ORAL_TABLET | 0 refills | 0 days | Status: CP
Start: 2022-10-06 — End: ?

## 2022-10-11 ENCOUNTER — Institutional Professional Consult (permissible substitution): Admit: 2022-10-11 | Discharge: 2022-10-12 | Payer: MEDICARE

## 2022-10-11 DIAGNOSIS — Z21 Asymptomatic human immunodeficiency virus [HIV] infection status: Principal | ICD-10-CM

## 2022-10-12 NOTE — Unmapped (Signed)
Kelly Cervantes has been contacted in regards to their refill of DOVATO. At this time, they have declined refill due to  no longer taking medication . Refill assessment call date has been updated per the patient's request.

## 2022-10-12 NOTE — Unmapped (Signed)
Specialty Medication(s): Dovato 50-300mg     Kelly Cervantes has been dis-enrolled from the [facility] specialty pharmacy services due to a change in therapy. The patient is now taking Cabenuva and is not filling at the [facility].    She can continue to receive her non-specialty medications from the [facility] if desired.    Additional information provided to the patient: n/a    Roderic Palau, PharmD  [facility] Specialty Pharmacist

## 2022-10-15 DIAGNOSIS — J45909 Unspecified asthma, uncomplicated: Principal | ICD-10-CM

## 2022-10-15 MED ORDER — BECLOMETHASONE DIPROP 80 MCG/ACTUATION HFA BREATH ACTIVATED AEROSOL
Freq: Every evening | RESPIRATORY_TRACT | 6 refills | 0 days | Status: CP
Start: 2022-10-15 — End: 2023-04-13

## 2022-11-15 ENCOUNTER — Ambulatory Visit: Admit: 2022-11-15 | Payer: MEDICARE

## 2022-11-16 DIAGNOSIS — M542 Cervicalgia: Secondary | ICD-10-CM | POA: Diagnosis not present

## 2022-11-16 DIAGNOSIS — G43719 Chronic migraine without aura, intractable, without status migrainosus: Secondary | ICD-10-CM | POA: Diagnosis not present

## 2022-11-16 DIAGNOSIS — M791 Myalgia, unspecified site: Secondary | ICD-10-CM | POA: Diagnosis not present

## 2022-11-25 ENCOUNTER — Ambulatory Visit: Admit: 2022-11-25 | Discharge: 2022-11-26 | Payer: MEDICARE

## 2022-11-25 DIAGNOSIS — Z13220 Encounter for screening for lipoid disorders: Secondary | ICD-10-CM | POA: Diagnosis not present

## 2022-11-25 DIAGNOSIS — E785 Hyperlipidemia, unspecified: Secondary | ICD-10-CM | POA: Diagnosis not present

## 2022-11-25 DIAGNOSIS — I10 Essential (primary) hypertension: Secondary | ICD-10-CM | POA: Diagnosis not present

## 2022-11-25 DIAGNOSIS — G43909 Migraine, unspecified, not intractable, without status migrainosus: Secondary | ICD-10-CM | POA: Diagnosis not present

## 2022-11-25 DIAGNOSIS — Z8249 Family history of ischemic heart disease and other diseases of the circulatory system: Secondary | ICD-10-CM | POA: Diagnosis not present

## 2022-11-25 DIAGNOSIS — R55 Syncope and collapse: Secondary | ICD-10-CM | POA: Diagnosis not present

## 2022-11-25 DIAGNOSIS — R3589 Other polyuria: Secondary | ICD-10-CM | POA: Diagnosis not present

## 2022-11-25 DIAGNOSIS — Z8619 Personal history of other infectious and parasitic diseases: Secondary | ICD-10-CM | POA: Diagnosis not present

## 2022-11-25 DIAGNOSIS — R053 Chronic cough: Secondary | ICD-10-CM | POA: Diagnosis not present

## 2022-11-25 DIAGNOSIS — G4733 Obstructive sleep apnea (adult) (pediatric): Secondary | ICD-10-CM | POA: Diagnosis not present

## 2022-11-25 DIAGNOSIS — F419 Anxiety disorder, unspecified: Principal | ICD-10-CM

## 2022-11-25 DIAGNOSIS — Z21 Asymptomatic human immunodeficiency virus [HIV] infection status: Principal | ICD-10-CM

## 2022-11-25 DIAGNOSIS — F329 Major depressive disorder, single episode, unspecified: Principal | ICD-10-CM

## 2022-11-25 DIAGNOSIS — Z113 Encounter for screening for infections with a predominantly sexual mode of transmission: Principal | ICD-10-CM

## 2022-11-25 MED ORDER — BUSPIRONE 5 MG TABLET
ORAL_TABLET | Freq: Two times a day (BID) | ORAL | 11 refills | 30 days | Status: CP
Start: 2022-11-25 — End: 2023-11-25

## 2022-11-25 MED ORDER — ATORVASTATIN 20 MG TABLET
ORAL | 3 refills | 90 days | Status: CP
Start: 2022-11-25 — End: 2023-11-25

## 2022-11-25 MED ORDER — VALSARTAN 160 MG-HYDROCHLOROTHIAZIDE 25 MG TABLET
ORAL_TABLET | Freq: Every day | ORAL | 5 refills | 30 days | Status: CP
Start: 2022-11-25 — End: ?

## 2022-11-29 MED ORDER — HYDROXYZINE HCL 25 MG TABLET
ORAL_TABLET | 1 refills | 0 days
Start: 2022-11-29 — End: ?

## 2022-12-05 DIAGNOSIS — R55 Syncope and collapse: Secondary | ICD-10-CM | POA: Diagnosis not present

## 2022-12-08 ENCOUNTER — Ambulatory Visit: Admit: 2022-12-08 | Discharge: 2022-12-08 | Payer: MEDICARE

## 2022-12-08 ENCOUNTER — Institutional Professional Consult (permissible substitution): Admit: 2022-12-08 | Discharge: 2022-12-08 | Payer: MEDICARE

## 2022-12-08 DIAGNOSIS — Z5181 Encounter for therapeutic drug level monitoring: Secondary | ICD-10-CM | POA: Diagnosis not present

## 2022-12-08 DIAGNOSIS — Z1211 Encounter for screening for malignant neoplasm of colon: Secondary | ICD-10-CM | POA: Diagnosis not present

## 2022-12-08 DIAGNOSIS — Z1231 Encounter for screening mammogram for malignant neoplasm of breast: Secondary | ICD-10-CM | POA: Diagnosis not present

## 2022-12-08 DIAGNOSIS — Z79899 Other long term (current) drug therapy: Secondary | ICD-10-CM | POA: Diagnosis not present

## 2022-12-08 DIAGNOSIS — Z9189 Other specified personal risk factors, not elsewhere classified: Secondary | ICD-10-CM | POA: Diagnosis not present

## 2022-12-08 DIAGNOSIS — B20 Human immunodeficiency virus [HIV] disease: Principal | ICD-10-CM

## 2022-12-08 DIAGNOSIS — F419 Anxiety disorder, unspecified: Principal | ICD-10-CM

## 2022-12-08 DIAGNOSIS — B182 Chronic viral hepatitis C: Principal | ICD-10-CM

## 2022-12-08 DIAGNOSIS — I10 Essential (primary) hypertension: Principal | ICD-10-CM

## 2022-12-08 DIAGNOSIS — Z21 Asymptomatic human immunodeficiency virus [HIV] infection status: Principal | ICD-10-CM

## 2022-12-08 MED ORDER — HYDROXYZINE HCL 25 MG TABLET
ORAL_TABLET | Freq: Three times a day (TID) | ORAL | 0 refills | 30 days | Status: CP | PRN
Start: 2022-12-08 — End: ?

## 2022-12-08 MED ORDER — FLUOXETINE 20 MG CAPSULE
ORAL_CAPSULE | Freq: Every day | ORAL | 2 refills | 30 days | Status: CP
Start: 2022-12-08 — End: 2023-03-08

## 2022-12-17 DIAGNOSIS — F419 Anxiety disorder, unspecified: Principal | ICD-10-CM

## 2022-12-17 MED ORDER — BUSPIRONE 5 MG TABLET
ORAL_TABLET | Freq: Two times a day (BID) | ORAL | 4 refills | 0 days
Start: 2022-12-17 — End: ?

## 2022-12-20 ENCOUNTER — Ambulatory Visit: Admit: 2022-12-20 | Payer: MEDICARE

## 2022-12-20 MED ORDER — BUSPIRONE 5 MG TABLET
ORAL_TABLET | Freq: Two times a day (BID) | ORAL | 4 refills | 90 days
Start: 2022-12-20 — End: ?

## 2022-12-22 ENCOUNTER — Ambulatory Visit: Admit: 2022-12-22 | Discharge: 2022-12-23 | Payer: MEDICARE

## 2022-12-22 DIAGNOSIS — I10 Essential (primary) hypertension: Secondary | ICD-10-CM | POA: Diagnosis not present

## 2022-12-27 DIAGNOSIS — F419 Anxiety disorder, unspecified: Principal | ICD-10-CM

## 2022-12-27 MED ORDER — HYDROXYZINE HCL 25 MG TABLET
ORAL_TABLET | 1 refills | 0 days
Start: 2022-12-27 — End: ?

## 2022-12-28 DIAGNOSIS — M791 Myalgia, unspecified site: Secondary | ICD-10-CM | POA: Diagnosis not present

## 2022-12-28 DIAGNOSIS — M542 Cervicalgia: Secondary | ICD-10-CM | POA: Diagnosis not present

## 2022-12-28 MED ORDER — HYDROXYZINE HCL 25 MG TABLET
ORAL_TABLET | 1 refills | 0 days
Start: 2022-12-28 — End: ?

## 2022-12-29 DIAGNOSIS — Z21 Asymptomatic human immunodeficiency virus [HIV] infection status: Principal | ICD-10-CM

## 2022-12-31 ENCOUNTER — Ambulatory Visit: Admit: 2022-12-31 | Discharge: 2023-01-01 | Payer: MEDICARE

## 2022-12-31 DIAGNOSIS — Z1231 Encounter for screening mammogram for malignant neoplasm of breast: Secondary | ICD-10-CM | POA: Diagnosis not present

## 2023-01-03 DIAGNOSIS — F419 Anxiety disorder, unspecified: Principal | ICD-10-CM

## 2023-01-03 MED ORDER — FLUOXETINE 20 MG CAPSULE
ORAL_CAPSULE | ORAL | 1 refills | 0 days
Start: 2023-01-03 — End: ?

## 2023-01-05 ENCOUNTER — Ambulatory Visit: Admit: 2023-01-05 | Discharge: 2023-01-06 | Payer: MEDICARE

## 2023-01-07 DIAGNOSIS — F419 Anxiety disorder, unspecified: Principal | ICD-10-CM

## 2023-01-07 MED ORDER — HYDROXYZINE HCL 25 MG TABLET
ORAL_TABLET | 0 refills | 0 days
Start: 2023-01-07 — End: ?

## 2023-01-10 ENCOUNTER — Ambulatory Visit: Admit: 2023-01-10 | Discharge: 2023-01-11 | Payer: MEDICARE

## 2023-01-10 DIAGNOSIS — F411 Generalized anxiety disorder: Principal | ICD-10-CM

## 2023-01-10 DIAGNOSIS — F4310 Post-traumatic stress disorder, unspecified: Principal | ICD-10-CM

## 2023-01-10 DIAGNOSIS — F39 Unspecified mood [affective] disorder: Principal | ICD-10-CM

## 2023-01-10 MED ORDER — HYDROXYZINE HCL 25 MG TABLET
ORAL_TABLET | Freq: Two times a day (BID) | ORAL | 0 refills | 23 days | Status: CP | PRN
Start: 2023-01-10 — End: ?

## 2023-01-10 MED ORDER — TIZANIDINE 4 MG TABLET
ORAL_TABLET | Freq: Every evening | ORAL | 0 refills | 30 days | Status: CP
Start: 2023-01-10 — End: ?

## 2023-01-10 MED ORDER — BUSPIRONE 10 MG TABLET
ORAL_TABLET | Freq: Two times a day (BID) | ORAL | 0 refills | 30 days | Status: CP
Start: 2023-01-10 — End: 2024-01-10

## 2023-01-10 MED ORDER — FLUOXETINE 20 MG CAPSULE
ORAL_CAPSULE | Freq: Every day | ORAL | 1 refills | 30 days | Status: CP
Start: 2023-01-10 — End: 2023-04-10

## 2023-01-11 DIAGNOSIS — G4733 Obstructive sleep apnea (adult) (pediatric): Secondary | ICD-10-CM | POA: Diagnosis not present

## 2023-01-25 DIAGNOSIS — Z21 Asymptomatic human immunodeficiency virus [HIV] infection status: Principal | ICD-10-CM

## 2023-02-02 ENCOUNTER — Institutional Professional Consult (permissible substitution): Admit: 2023-02-02 | Discharge: 2023-02-03 | Payer: MEDICARE

## 2023-02-02 DIAGNOSIS — Z21 Asymptomatic human immunodeficiency virus [HIV] infection status: Principal | ICD-10-CM

## 2023-02-04 ENCOUNTER — Ambulatory Visit: Admit: 2023-02-04 | Discharge: 2023-02-05 | Payer: MEDICARE

## 2023-02-04 DIAGNOSIS — K769 Liver disease, unspecified: Secondary | ICD-10-CM | POA: Diagnosis not present

## 2023-02-07 ENCOUNTER — Telehealth: Admit: 2023-02-07 | Discharge: 2023-02-08 | Payer: MEDICARE

## 2023-02-07 DIAGNOSIS — F4310 Post-traumatic stress disorder, unspecified: Principal | ICD-10-CM

## 2023-02-07 DIAGNOSIS — F39 Unspecified mood [affective] disorder: Principal | ICD-10-CM

## 2023-02-07 DIAGNOSIS — F411 Generalized anxiety disorder: Principal | ICD-10-CM

## 2023-02-07 MED ORDER — BUSPIRONE 10 MG TABLET
ORAL_TABLET | Freq: Two times a day (BID) | ORAL | 1 refills | 30 days | Status: CP
Start: 2023-02-07 — End: 2023-04-08

## 2023-02-07 MED ORDER — HYDROXYZINE HCL 50 MG TABLET
ORAL_TABLET | Freq: Two times a day (BID) | ORAL | 1 refills | 30 days | Status: CP | PRN
Start: 2023-02-07 — End: 2023-04-08

## 2023-02-07 MED ORDER — FLUOXETINE 40 MG CAPSULE
ORAL_CAPSULE | Freq: Every day | ORAL | 1 refills | 30 days | Status: CP
Start: 2023-02-07 — End: 2023-04-08

## 2023-02-07 MED ORDER — TIZANIDINE 4 MG TABLET
ORAL_TABLET | Freq: Every evening | ORAL | 1 refills | 30 days | Status: CP
Start: 2023-02-07 — End: ?

## 2023-02-09 DIAGNOSIS — F411 Generalized anxiety disorder: Principal | ICD-10-CM

## 2023-02-09 MED ORDER — HYDROXYZINE HCL 25 MG TABLET
ORAL_TABLET | Freq: Two times a day (BID) | ORAL | 0 refills | 23 days | PRN
Start: 2023-02-09 — End: ?

## 2023-02-09 MED ORDER — BUSPIRONE 10 MG TABLET
ORAL_TABLET | Freq: Two times a day (BID) | ORAL | 1 refills | 30 days
Start: 2023-02-09 — End: ?

## 2023-02-11 DIAGNOSIS — G43719 Chronic migraine without aura, intractable, without status migrainosus: Secondary | ICD-10-CM | POA: Diagnosis not present

## 2023-02-11 DIAGNOSIS — M791 Myalgia, unspecified site: Secondary | ICD-10-CM | POA: Diagnosis not present

## 2023-02-11 DIAGNOSIS — M542 Cervicalgia: Secondary | ICD-10-CM | POA: Diagnosis not present

## 2023-02-25 DIAGNOSIS — G4733 Obstructive sleep apnea (adult) (pediatric): Secondary | ICD-10-CM | POA: Diagnosis not present

## 2023-03-06 DIAGNOSIS — Z21 Asymptomatic human immunodeficiency virus [HIV] infection status: Principal | ICD-10-CM

## 2023-03-09 ENCOUNTER — Ambulatory Visit: Admit: 2023-03-09 | Discharge: 2023-03-10 | Payer: MEDICARE

## 2023-03-09 DIAGNOSIS — G43909 Migraine, unspecified, not intractable, without status migrainosus: Secondary | ICD-10-CM | POA: Diagnosis not present

## 2023-03-09 DIAGNOSIS — I10 Essential (primary) hypertension: Secondary | ICD-10-CM | POA: Diagnosis not present

## 2023-03-09 DIAGNOSIS — E785 Hyperlipidemia, unspecified: Secondary | ICD-10-CM | POA: Diagnosis not present

## 2023-03-09 DIAGNOSIS — Z1211 Encounter for screening for malignant neoplasm of colon: Secondary | ICD-10-CM | POA: Diagnosis not present

## 2023-03-09 DIAGNOSIS — K219 Gastro-esophageal reflux disease without esophagitis: Secondary | ICD-10-CM | POA: Diagnosis not present

## 2023-03-09 DIAGNOSIS — J4540 Moderate persistent asthma, uncomplicated: Secondary | ICD-10-CM | POA: Diagnosis not present

## 2023-03-09 DIAGNOSIS — Z21 Asymptomatic human immunodeficiency virus [HIV] infection status: Principal | ICD-10-CM

## 2023-03-09 DIAGNOSIS — F419 Anxiety disorder, unspecified: Principal | ICD-10-CM

## 2023-03-09 DIAGNOSIS — F329 Major depressive disorder, single episode, unspecified: Principal | ICD-10-CM

## 2023-03-09 MED ORDER — SYMBICORT 160 MCG-4.5 MCG/ACTUATION HFA AEROSOL INHALER
Freq: Two times a day (BID) | RESPIRATORY_TRACT | 11 refills | 31 days | Status: CP
Start: 2023-03-09 — End: 2024-03-08

## 2023-03-09 MED ORDER — OMEPRAZOLE 40 MG CAPSULE,DELAYED RELEASE
ORAL_CAPSULE | Freq: Every day | ORAL | 2 refills | 90 days | Status: CP
Start: 2023-03-09 — End: ?

## 2023-03-09 MED ORDER — VALSARTAN 320 MG-HYDROCHLOROTHIAZIDE 25 MG TABLET
ORAL_TABLET | Freq: Every day | ORAL | 3 refills | 90 days | Status: CP
Start: 2023-03-09 — End: ?

## 2023-03-17 DIAGNOSIS — M542 Cervicalgia: Secondary | ICD-10-CM | POA: Diagnosis not present

## 2023-03-17 DIAGNOSIS — G43719 Chronic migraine without aura, intractable, without status migrainosus: Secondary | ICD-10-CM | POA: Diagnosis not present

## 2023-03-17 DIAGNOSIS — M791 Myalgia, unspecified site: Secondary | ICD-10-CM | POA: Diagnosis not present

## 2023-03-21 ENCOUNTER — Ambulatory Visit: Admit: 2023-03-21 | Payer: MEDICARE

## 2023-03-25 ENCOUNTER — Ambulatory Visit: Payer: 59 | Admitting: Nurse Practitioner

## 2023-03-30 ENCOUNTER — Institutional Professional Consult (permissible substitution): Admit: 2023-03-30 | Discharge: 2023-03-30 | Payer: MEDICARE

## 2023-03-30 ENCOUNTER — Ambulatory Visit: Admit: 2023-03-30 | Discharge: 2023-03-30 | Payer: MEDICARE

## 2023-03-30 DIAGNOSIS — Z13220 Encounter for screening for lipoid disorders: Secondary | ICD-10-CM | POA: Diagnosis not present

## 2023-03-30 DIAGNOSIS — R799 Abnormal finding of blood chemistry, unspecified: Secondary | ICD-10-CM | POA: Diagnosis not present

## 2023-03-30 DIAGNOSIS — N3000 Acute cystitis without hematuria: Secondary | ICD-10-CM | POA: Diagnosis not present

## 2023-03-30 DIAGNOSIS — R300 Dysuria: Secondary | ICD-10-CM | POA: Diagnosis not present

## 2023-03-30 DIAGNOSIS — I10 Essential (primary) hypertension: Secondary | ICD-10-CM | POA: Diagnosis not present

## 2023-03-30 DIAGNOSIS — Z21 Asymptomatic human immunodeficiency virus [HIV] infection status: Principal | ICD-10-CM

## 2023-03-30 MED ORDER — CEPHALEXIN 500 MG CAPSULE
ORAL_CAPSULE | Freq: Four times a day (QID) | ORAL | 0 refills | 5 days | Status: CP
Start: 2023-03-30 — End: 2023-04-04

## 2023-04-12 DIAGNOSIS — G4733 Obstructive sleep apnea (adult) (pediatric): Secondary | ICD-10-CM | POA: Diagnosis not present

## 2023-04-12 DIAGNOSIS — F411 Generalized anxiety disorder: Principal | ICD-10-CM

## 2023-04-12 MED ORDER — TIZANIDINE 4 MG TABLET
ORAL_TABLET | 1 refills | 0.00 days | Status: CP
Start: 2023-04-12 — End: ?

## 2023-04-12 MED ORDER — HYDROXYZINE HCL 50 MG TABLET
ORAL_TABLET | Freq: Two times a day (BID) | ORAL | 1 refills | 30.00 days | Status: CP | PRN
Start: 2023-04-12 — End: 2023-06-11

## 2023-04-12 MED ORDER — BUSPIRONE 10 MG TABLET
ORAL_TABLET | Freq: Two times a day (BID) | ORAL | 1 refills | 30.00 days | Status: CP
Start: 2023-04-12 — End: 2023-06-11

## 2023-04-24 DIAGNOSIS — G4733 Obstructive sleep apnea (adult) (pediatric): Secondary | ICD-10-CM | POA: Diagnosis not present

## 2023-05-01 DIAGNOSIS — Z21 Asymptomatic human immunodeficiency virus [HIV] infection status: Principal | ICD-10-CM

## 2023-05-10 DIAGNOSIS — F411 Generalized anxiety disorder: Principal | ICD-10-CM

## 2023-05-10 MED ORDER — FLUOXETINE 40 MG CAPSULE
ORAL_CAPSULE | Freq: Every day | ORAL | 1 refills | 15.00 days | Status: CP
Start: 2023-05-10 — End: ?

## 2023-05-25 ENCOUNTER — Ambulatory Visit: Admit: 2023-05-25 | Discharge: 2023-05-26 | Payer: MEDICARE

## 2023-05-25 DIAGNOSIS — Z21 Asymptomatic human immunodeficiency virus [HIV] infection status: Principal | ICD-10-CM

## 2023-05-26 DIAGNOSIS — I10 Essential (primary) hypertension: Principal | ICD-10-CM

## 2023-05-26 MED ORDER — VALSARTAN 160 MG-HYDROCHLOROTHIAZIDE 25 MG TABLET
ORAL_TABLET | Freq: Every day | ORAL | 1 refills | 90.00 days
Start: 2023-05-26 — End: ?

## 2023-06-01 ENCOUNTER — Ambulatory Visit: Admit: 2023-06-01 | Discharge: 2023-06-02 | Payer: MEDICARE

## 2023-06-01 DIAGNOSIS — Z79899 Other long term (current) drug therapy: Principal | ICD-10-CM

## 2023-06-01 DIAGNOSIS — B20 Human immunodeficiency virus [HIV] disease: Principal | ICD-10-CM

## 2023-06-01 DIAGNOSIS — G43909 Migraine, unspecified, not intractable, without status migrainosus: Principal | ICD-10-CM

## 2023-06-01 DIAGNOSIS — Z9189 Other specified personal risk factors, not elsewhere classified: Principal | ICD-10-CM

## 2023-06-01 DIAGNOSIS — R55 Syncope and collapse: Principal | ICD-10-CM

## 2023-06-01 DIAGNOSIS — Z131 Encounter for screening for diabetes mellitus: Principal | ICD-10-CM

## 2023-06-01 DIAGNOSIS — Z23 Encounter for immunization: Principal | ICD-10-CM

## 2023-06-01 DIAGNOSIS — Z5181 Encounter for therapeutic drug level monitoring: Principal | ICD-10-CM

## 2023-06-01 DIAGNOSIS — Z21 Asymptomatic human immunodeficiency virus [HIV] infection status: Principal | ICD-10-CM

## 2023-06-01 DIAGNOSIS — I10 Essential (primary) hypertension: Principal | ICD-10-CM

## 2023-06-01 DIAGNOSIS — F419 Anxiety disorder, unspecified: Principal | ICD-10-CM

## 2023-06-01 DIAGNOSIS — G8929 Other chronic pain: Principal | ICD-10-CM

## 2023-06-01 DIAGNOSIS — E876 Hypokalemia: Principal | ICD-10-CM

## 2023-06-01 DIAGNOSIS — M25561 Pain in right knee: Principal | ICD-10-CM

## 2023-06-01 MED ORDER — RIZATRIPTAN 10 MG DISINTEGRATING TABLET
ORAL_TABLET | Freq: Once | 2 refills | 0.00 days | Status: CP | PRN
Start: 2023-06-01 — End: ?

## 2023-06-01 MED ORDER — POTASSIUM CHLORIDE ER 20 MEQ TABLET,EXTENDED RELEASE(PART/CRYST)
ORAL_TABLET | Freq: Every day | ORAL | 0 refills | 30.00 days | Status: CP
Start: 2023-06-01 — End: 2024-05-31

## 2023-06-01 MED ORDER — AMLODIPINE 5 MG TABLET
ORAL_TABLET | Freq: Every day | ORAL | 11 refills | 30 days | Status: CP
Start: 2023-06-01 — End: 2024-05-31

## 2023-06-07 ENCOUNTER — Ambulatory Visit: Admit: 2023-06-07 | Payer: MEDICARE | Attending: Sports Medicine | Primary: Sports Medicine

## 2023-06-18 DIAGNOSIS — F411 Generalized anxiety disorder: Principal | ICD-10-CM

## 2023-06-18 MED ORDER — BUSPIRONE 10 MG TABLET
ORAL_TABLET | Freq: Two times a day (BID) | ORAL | 1 refills | 0.00 days
Start: 2023-06-18 — End: ?

## 2023-06-18 MED ORDER — TIZANIDINE 4 MG TABLET
ORAL_TABLET | 1 refills | 0.00 days
Start: 2023-06-18 — End: ?

## 2023-06-18 MED ORDER — HYDROXYZINE HCL 50 MG TABLET
ORAL_TABLET | Freq: Two times a day (BID) | ORAL | 1 refills | 0.00 days | PRN
Start: 2023-06-18 — End: ?

## 2023-06-20 ENCOUNTER — Encounter: Admit: 2023-06-20 | Discharge: 2023-06-21 | Payer: MEDICARE

## 2023-06-20 DIAGNOSIS — F411 Generalized anxiety disorder: Principal | ICD-10-CM

## 2023-06-20 MED ORDER — HYDROXYZINE HCL 50 MG TABLET
ORAL_TABLET | Freq: Two times a day (BID) | ORAL | 0 refills | 30.00 days | Status: CP | PRN
Start: 2023-06-20 — End: 2023-08-19

## 2023-06-20 MED ORDER — BUSPIRONE 10 MG TABLET
ORAL_TABLET | Freq: Two times a day (BID) | ORAL | 0 refills | 30.00 days | Status: CP
Start: 2023-06-20 — End: 2023-07-20

## 2023-06-20 MED ORDER — TIZANIDINE 4 MG TABLET
ORAL_TABLET | 1 refills | 0.00 days
Start: 2023-06-20 — End: ?

## 2023-06-20 MED ORDER — FLUOXETINE 20 MG CAPSULE
ORAL_CAPSULE | Freq: Every day | ORAL | 0 refills | 30.00 days | Status: CP
Start: 2023-06-20 — End: 2023-07-20

## 2023-06-24 MED ORDER — PEG 3350-ELECTROLYTES 236 GRAM-22.74 GRAM-6.74 GRAM-5.86 GRAM SOLUTION
ORAL | 0 refills | 0.00 days | Status: CP
Start: 2023-06-24 — End: ?

## 2023-06-26 DIAGNOSIS — Z21 Asymptomatic human immunodeficiency virus [HIV] infection status: Principal | ICD-10-CM

## 2023-06-28 ENCOUNTER — Ambulatory Visit: Admit: 2023-06-28 | Discharge: 2023-06-29 | Payer: MEDICAID | Attending: Registered" | Primary: Registered"

## 2023-06-30 DIAGNOSIS — E876 Hypokalemia: Principal | ICD-10-CM

## 2023-06-30 MED ORDER — POTASSIUM CHLORIDE ER 20 MEQ TABLET,EXTENDED RELEASE(PART/CRYST)
ORAL_TABLET | Freq: Every day | ORAL | 0 refills | 0.00 days
Start: 2023-06-30 — End: ?

## 2023-07-04 MED ORDER — POTASSIUM CHLORIDE ER 20 MEQ TABLET,EXTENDED RELEASE(PART/CRYST)
ORAL_TABLET | Freq: Every day | ORAL | 3 refills | 30.00 days | Status: CP
Start: 2023-07-04 — End: ?

## 2023-07-12 ENCOUNTER — Inpatient Hospital Stay: Admit: 2023-07-12 | Discharge: 2023-07-12 | Payer: MEDICAID

## 2023-07-12 ENCOUNTER — Encounter
Admit: 2023-07-12 | Discharge: 2023-07-12 | Payer: MEDICARE | Attending: Student in an Organized Health Care Education/Training Program | Primary: Student in an Organized Health Care Education/Training Program

## 2023-07-12 DIAGNOSIS — F411 Generalized anxiety disorder: Principal | ICD-10-CM

## 2023-07-12 MED ORDER — FLUOXETINE 20 MG CAPSULE
ORAL_CAPSULE | ORAL | 0 refills | 0.00 days
Start: 2023-07-12 — End: ?

## 2023-07-13 ENCOUNTER — Inpatient Hospital Stay: Admit: 2023-07-13 | Discharge: 2023-07-14 | Payer: MEDICARE

## 2023-07-13 ENCOUNTER — Ambulatory Visit: Admit: 2023-07-13 | Discharge: 2023-07-14 | Payer: MEDICARE | Attending: Sports Medicine | Primary: Sports Medicine

## 2023-07-13 MED ORDER — FLUOXETINE 20 MG CAPSULE
ORAL_CAPSULE | ORAL | 0 refills | 0.00 days | Status: CP
Start: 2023-07-13 — End: ?

## 2023-07-16 DIAGNOSIS — F411 Generalized anxiety disorder: Principal | ICD-10-CM

## 2023-07-16 MED ORDER — HYDROXYZINE HCL 50 MG TABLET
ORAL_TABLET | Freq: Two times a day (BID) | ORAL | 0 refills | 0 days | PRN
Start: 2023-07-16 — End: ?

## 2023-07-17 DIAGNOSIS — F411 Generalized anxiety disorder: Principal | ICD-10-CM

## 2023-07-17 MED ORDER — BUSPIRONE 10 MG TABLET
ORAL_TABLET | Freq: Two times a day (BID) | ORAL | 0 refills | 0 days
Start: 2023-07-17 — End: ?

## 2023-07-18 ENCOUNTER — Encounter: Admit: 2023-07-18 | Discharge: 2023-07-19 | Payer: MEDICARE

## 2023-07-18 DIAGNOSIS — F329 Major depressive disorder, single episode, unspecified: Principal | ICD-10-CM

## 2023-07-18 DIAGNOSIS — F4310 Post-traumatic stress disorder, unspecified: Principal | ICD-10-CM

## 2023-07-18 DIAGNOSIS — F411 Generalized anxiety disorder: Principal | ICD-10-CM

## 2023-07-18 MED ORDER — HYDROXYZINE HCL 50 MG TABLET
ORAL_TABLET | Freq: Two times a day (BID) | ORAL | 1 refills | 23 days | Status: CP | PRN
Start: 2023-07-18 — End: 2023-09-16

## 2023-07-18 MED ORDER — FLUOXETINE 40 MG CAPSULE
ORAL_CAPSULE | Freq: Every day | ORAL | 1 refills | 30 days | Status: CP
Start: 2023-07-18 — End: ?

## 2023-07-18 MED ORDER — BUSPIRONE 10 MG TABLET
ORAL_TABLET | Freq: Two times a day (BID) | ORAL | 1 refills | 30 days | Status: CP
Start: 2023-07-18 — End: ?

## 2023-07-20 ENCOUNTER — Ambulatory Visit: Admit: 2023-07-20 | Discharge: 2023-07-21 | Payer: MEDICARE

## 2023-07-20 DIAGNOSIS — Z21 Asymptomatic human immunodeficiency virus [HIV] infection status: Principal | ICD-10-CM

## 2023-07-31 DIAGNOSIS — Z21 Asymptomatic human immunodeficiency virus [HIV] infection status: Principal | ICD-10-CM

## 2023-08-01 ENCOUNTER — Ambulatory Visit: Admit: 2023-08-01 | Discharge: 2023-08-02

## 2023-08-01 DIAGNOSIS — G43909 Migraine, unspecified, not intractable, without status migrainosus: Principal | ICD-10-CM

## 2023-08-01 DIAGNOSIS — Z8249 Family history of ischemic heart disease and other diseases of the circulatory system: Principal | ICD-10-CM

## 2023-08-01 DIAGNOSIS — F09 Unspecified mental disorder due to known physiological condition: Principal | ICD-10-CM

## 2023-08-01 DIAGNOSIS — F419 Anxiety disorder, unspecified: Principal | ICD-10-CM

## 2023-08-01 DIAGNOSIS — K219 Gastro-esophageal reflux disease without esophagitis: Principal | ICD-10-CM

## 2023-08-01 DIAGNOSIS — I10 Essential (primary) hypertension: Principal | ICD-10-CM

## 2023-08-01 DIAGNOSIS — G8929 Other chronic pain: Principal | ICD-10-CM

## 2023-08-01 DIAGNOSIS — R2990 Unspecified symptoms and signs involving the nervous system: Principal | ICD-10-CM

## 2023-08-01 DIAGNOSIS — E876 Hypokalemia: Principal | ICD-10-CM

## 2023-08-01 DIAGNOSIS — U099 COVID-19 long hauler manifesting chronic neurologic symptoms: Principal | ICD-10-CM

## 2023-08-01 DIAGNOSIS — R519 Chronic intractable headache, unspecified headache type: Principal | ICD-10-CM

## 2023-08-01 MED ORDER — METHYLPREDNISOLONE 4 MG TABLETS IN A DOSE PACK
0 refills | 0.00 days | Status: CP
Start: 2023-08-01 — End: ?

## 2023-08-01 MED ORDER — OMEPRAZOLE 40 MG CAPSULE,DELAYED RELEASE
ORAL_CAPSULE | Freq: Two times a day (BID) | ORAL | 2 refills | 90.00 days | Status: CP
Start: 2023-08-01 — End: ?

## 2023-08-01 MED ORDER — RIMEGEPANT 75 MG DISINTEGRATING TABLET
ORAL_TABLET | ORAL | 3 refills | 30.00 days | Status: CP
Start: 2023-08-01 — End: ?

## 2023-08-10 DIAGNOSIS — F411 Generalized anxiety disorder: Principal | ICD-10-CM

## 2023-08-10 MED ORDER — HYDROXYZINE HCL 50 MG TABLET
ORAL_TABLET | Freq: Two times a day (BID) | ORAL | 1 refills | 68.00 days | Status: CP | PRN
Start: 2023-08-10 — End: 2023-10-09

## 2023-08-22 ENCOUNTER — Encounter: Admit: 2023-08-22 | Discharge: 2023-08-23 | Payer: Medicare (Managed Care)

## 2023-08-22 DIAGNOSIS — F411 Generalized anxiety disorder: Principal | ICD-10-CM

## 2023-08-22 DIAGNOSIS — F329 Major depressive disorder, single episode, unspecified: Principal | ICD-10-CM

## 2023-08-22 DIAGNOSIS — F4310 Post-traumatic stress disorder, unspecified: Principal | ICD-10-CM

## 2023-08-22 MED ORDER — HYDROXYZINE HCL 50 MG TABLET
ORAL_TABLET | Freq: Two times a day (BID) | ORAL | 1 refills | 30.00000 days | Status: CP | PRN
Start: 2023-08-22 — End: 2023-10-21

## 2023-08-22 MED ORDER — FLUOXETINE 40 MG CAPSULE
ORAL_CAPSULE | Freq: Every day | ORAL | 1 refills | 30.00000 days | Status: CP
Start: 2023-08-22 — End: ?

## 2023-08-22 MED ORDER — BUSPIRONE 15 MG TABLET
ORAL_TABLET | Freq: Two times a day (BID) | ORAL | 1 refills | 30.00000 days | Status: CP
Start: 2023-08-22 — End: ?

## 2023-08-25 ENCOUNTER — Ambulatory Visit: Admit: 2023-08-25 | Discharge: 2023-08-26 | Payer: Medicare (Managed Care) | Attending: Neurology | Primary: Neurology

## 2023-08-25 DIAGNOSIS — G43E19 Intractable chronic migraine with aura and without status migrainosus: Principal | ICD-10-CM

## 2023-08-25 DIAGNOSIS — G43909 Migraine, unspecified, not intractable, without status migrainosus: Principal | ICD-10-CM

## 2023-08-25 DIAGNOSIS — R404 Transient alteration of awareness: Principal | ICD-10-CM

## 2023-08-25 DIAGNOSIS — G4733 Obstructive sleep apnea (adult) (pediatric): Principal | ICD-10-CM

## 2023-08-25 MED ORDER — RIZATRIPTAN 10 MG DISINTEGRATING TABLET
ORAL_TABLET | Freq: Once | 2 refills | 0.00000 days | Status: CP | PRN
Start: 2023-08-25 — End: ?

## 2023-08-25 MED ORDER — GALCANEZUMAB-GNLM 120 MG/ML SUBCUTANEOUS PEN INJECTOR
SUBCUTANEOUS | 3 refills | 85.00000 days | Status: CP
Start: 2023-08-25 — End: 2024-08-24
  Filled 2023-09-15: qty 4, 84d supply, fill #0

## 2023-08-25 MED ORDER — PROMETHAZINE 25 MG RECTAL SUPPOSITORY
Freq: Four times a day (QID) | RECTAL | 1 refills | 3.00000 days | Status: CP | PRN
Start: 2023-08-25 — End: 2024-08-24
  Filled 2023-09-15: qty 12, 3d supply, fill #0

## 2023-08-25 MED ORDER — NURTEC ODT 75 MG DISINTEGRATING TABLET
ORAL_TABLET | Freq: Once | ORAL | 11 refills | 0.00000 days | Status: CP | PRN
Start: 2023-08-25 — End: ?
  Filled 2023-09-15: qty 8, 30d supply, fill #0

## 2023-09-06 ENCOUNTER — Inpatient Hospital Stay: Admit: 2023-09-06 | Discharge: 2023-09-07 | Payer: Medicare (Managed Care)

## 2023-09-08 NOTE — Unmapped (Signed)
 Opened in error

## 2023-09-08 NOTE — Unmapped (Signed)
 [facility] Specialty Medication Onboarding    Specialty Medication: Emgality  pen 120mg /mL injection  Prior Authorization: Approved   Financial Assistance: No - copay  <$25  Final Copay/Day Supply: $0 / 84 days    Insurance Restrictions: None     Notes to Pharmacist: N/A  Credit Card on File: no  Start Date on Rx:  N/A    The triage team has completed the benefits investigation and has determined that the patient is able to fill this medication at [facility]. Please contact the patient to complete the onboarding or follow up with the prescribing physician as needed.

## 2023-09-14 ENCOUNTER — Ambulatory Visit: Admit: 2023-09-14 | Discharge: 2023-09-15 | Payer: Medicare (Managed Care)

## 2023-09-14 DIAGNOSIS — Z21 Asymptomatic human immunodeficiency virus [HIV] infection status: Principal | ICD-10-CM

## 2023-09-14 MED ADMIN — cabotegravir-rilpivirine (CABENUVA) 600 mg-900 mg/3 mL extended-release injection 6 mL: 6 mL | INTRAMUSCULAR | @ 15:00:00 | Stop: 2023-09-14

## 2023-09-14 NOTE — Unmapped (Signed)
 Patient here for Cabenuva  injections. Denies changes in mood or sleep. Tolerated well. Needle size: 23G 1 1/2 Next appointment made for 11/16/2023.

## 2023-09-22 DIAGNOSIS — B20 Human immunodeficiency virus [HIV] disease: Principal | ICD-10-CM

## 2023-09-22 MED ORDER — CABOTEGRAVIR ER 600 MG/3 ML-RILPIVIRINE ER 900 MG/3ML IM SUSPENSION,ER
INTRAMUSCULAR | 6 refills | 56.00000 days | Status: CP
Start: 2023-09-22 — End: ?

## 2023-09-22 MED ORDER — CABENUVA 600 MG/3 ML-900 MG/3 ML IM SUSPENSION, EXTENDED RELEASE
5 refills | 0.00000 days
Start: 2023-09-22 — End: ?

## 2023-09-25 DIAGNOSIS — Z21 Asymptomatic human immunodeficiency virus [HIV] infection status: Principal | ICD-10-CM

## 2023-09-26 ENCOUNTER — Encounter: Admit: 2023-09-26 | Discharge: 2023-09-27 | Payer: Medicare (Managed Care)

## 2023-09-26 DIAGNOSIS — F411 Generalized anxiety disorder: Principal | ICD-10-CM

## 2023-09-26 DIAGNOSIS — F4310 Post-traumatic stress disorder, unspecified: Principal | ICD-10-CM

## 2023-09-26 DIAGNOSIS — F329 Major depressive disorder, single episode, unspecified: Principal | ICD-10-CM

## 2023-09-26 MED ORDER — HYDROXYZINE HCL 50 MG TABLET
ORAL_TABLET | Freq: Two times a day (BID) | ORAL | 0 refills | 90.00000 days | Status: CP | PRN
Start: 2023-09-26 — End: 2023-12-25

## 2023-09-26 MED ORDER — FLUOXETINE 40 MG CAPSULE
ORAL_CAPSULE | Freq: Every day | ORAL | 0 refills | 90.00000 days | Status: CP
Start: 2023-09-26 — End: ?

## 2023-09-26 MED ORDER — BUSPIRONE 15 MG TABLET
ORAL_TABLET | Freq: Two times a day (BID) | ORAL | 0 refills | 90.00000 days | Status: CP
Start: 2023-09-26 — End: 2023-12-25

## 2023-09-26 MED ORDER — TRAZODONE 50 MG TABLET
ORAL_TABLET | Freq: Every evening | ORAL | 0 refills | 30.00000 days | Status: CP
Start: 2023-09-26 — End: 2023-10-26
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
## 2023-09-26 NOTE — Unmapped (Signed)
 Follow-up instructions:  -- START Trazodone 50 mg nightly. May increase to 100 mg nightly after 3-4 nights if still having difficulty falling/staying asleep.  - Please continue taking your medications as prescribed for your mental health.   - Do not make changes to your medications, including taking more or less than prescribed, unless under the supervision of your physician. Be aware that some medications may make you feel worse if abruptly stopped  - Please refrain from using illicit substances, as these can affect your mood and could cause anxiety or other concerning symptoms.   - Seek further medical care for any increase in symptoms or new symptoms such as thoughts of wanting to hurt yourself or hurt others.     Contact info:  Life-threatening emergencies: Call 911, the 988 suicide and crisis lifeline, or go to the nearest ER for medical or psychiatric attention.           Issues that need urgent attention but are not life threatening: Call the clinic outpatient front desk for assistance:    - [phone] for [facility] adult psychiatry clinics located at [address] [facility]  - [phone] for [facility] child and adolescent psychiatry clinics located at [address]  - [phone] for [facility] and adult psychiatry clinics located at [address] [facility]    Non-urgent routine concerns and questions: Send a message through [facility] or call our clinic front desk.    Refill requests: Check with your pharmacy to initiate refill requests.    Regarding appointments:  - If you need to cancel your appointment, we ask that you call your clinic at least 24 hours before your scheduled appointment at the numbers listed above.  - If for any reason you arrive 15 minutes later than your scheduled appointment time, you may not be seen and your visit may be rescheduled.  - Please remember that we will not automatically reschedule missed appointments.  - If you miss two (2) appointments without letting us  know in advance, you will likely be referred to a provider in your community.  - We will do our best to be on time. Sometimes an emergency will arise that might cause your clinician to be late. We will try to inform you of this when you check in for your appointment. If you wait more than 15 minutes past your appointment time without such notice, please speak with the front desk staff.    In the event of bad weather, the clinic staff will attempt to contact you, should your appointment need to be rescheduled. Additionally, you can call the Patient Weather Line [phone] for system-wide clinic status    For more information and reminders regarding clinic policies (these were provided when you were admitted to the clinic), please ask the front desk.

## 2023-09-26 NOTE — Unmapped (Signed)
 [facility] left message to schedule [place]  to the clinic. Returned call. [place]  to arrive on 6/4.

## 2023-09-26 NOTE — Unmapped (Signed)
This was a telehealth service where a resident was involved. I was immediately available via telephone/pager and present on site, but I did not see the patient. I reviewed and discussed the encounter with the resident. I am in agreement with the assessment and plan as documented.      Derita Michelsen L Siria Calandro, MD

## 2023-09-27 ENCOUNTER — Ambulatory Visit: Admit: 2023-09-27 | Payer: Medicare (Managed Care) | Attending: Sports Medicine | Primary: Sports Medicine

## 2023-09-27 ENCOUNTER — Ambulatory Visit: Admit: 2023-09-27 | Payer: Medicare (Managed Care)

## 2023-09-27 NOTE — Unmapped (Unsigned)
 Internal Medicine Clinic Visit    Reason for visit: ***    A/P:    ***  There are no diagnoses linked to this encounter.   Assessment & Plan          Health Maintenance:   Health Maintenance   Topic Date Due    Zoster Vaccines (1 of 2) Never done    DTaP/Tdap/Td Vaccines (2 - Td or Tdap) 03/09/2021    Mammogram  12/30/2024    Colon Cancer Screening  07/12/2026    Pneumococcal Vaccine 50+  Completed    COVID-19 Vaccine  Completed    Influenza Vaccine  Completed         No follow-ups on file.  Next Visit:   ***  No orders of the defined types were placed in this encounter.   __________________________________________________________    HPI:  Pt has HIV, hypertension, anxiety, and chronic headaches   History of Present Illness        PTHomeBP   Lab Results   Component Value Date    ACD4 1,274 06/01/2023    CD4 49 06/01/2023    HIVCP 5,030 06/01/2018    HIVRS Not Detected 06/01/2023       __________________________________________________________    Problem List:  Patient Active Problem List   Diagnosis    Anxiety    Essential (primary) hypertension    History of hepatitis C    HIV infection      Major depressive disorder    Migraine    OSA on CPAP    PTSD (post-traumatic stress disorder)    Moderate persistent asthma without complication    Liver hemangioma    Lumbosacral pain, chronic    Seasonal and perennial allergic rhinoconjunctivitis    Family history of early CAD    Hyperlipidemia    Localized osteoarthritis of right knee       Medications:  Reviewed in EPIC    _________________________________________________________    Physical Exam:   Vital Signs:  There were no vitals filed for this visit.  Wt Readings from Last 3 Encounters:   09/14/23 88.7 kg (195 lb 9.6 oz)   08/25/23 91 kg (200 lb 9.6 oz)   08/01/23 89.1 kg (196 lb 6.4 oz)     Physical Exam        Gen: Well appearing, NAD  CV: RRR, no murmurs  Pulm: CTA bilaterally, no crackles or wheezes  Abd: Soft, NTND, normal BS. No HSM.  Ext: No edema  MSK:***  Psych:***  ***  Records review***  Last   Lab Results   Component Value Date    CREATININE 0.83 08/01/2023    CHOL 148 03/30/2023    HDL 63 (H) 03/30/2023    LDL 69 03/30/2023    NONHDL 85 03/30/2023    TRIG 81 03/30/2023    A1C 5.2 06/01/2023        The 10-year ASCVD risk score (Arnett DK, et al., 2019) is: 5.1%   Medication adherence and barriers to the treatment plan have been addressed. Opportunities to optimize healthy behaviors have been discussed. Patient / caregiver voiced understanding.    {TIP - HCC- RAFF Pilot- Clinical Documentation Specialist Recommendations-  No specialty comments available.   This text will self delete upon signing note:[id number]}     I personally spent *** minutes face-to-face and non-face-to-face in the care of this patient, which includes all pre, intra, and post visit time on the date of service.

## 2023-09-30 ENCOUNTER — Ambulatory Visit: Admit: 2023-09-30 | Discharge: 2023-10-01 | Payer: Medicare (Managed Care)

## 2023-09-30 ENCOUNTER — Inpatient Hospital Stay: Admit: 2023-09-30 | Discharge: 2023-10-01 | Payer: Medicare (Managed Care)

## 2023-09-30 MED ADMIN — gadopiclenol (ELUCIREM,VUEWAY) injection 8 mL: 8 mL | INTRAVENOUS | @ 17:00:00 | Stop: 2023-09-30

## 2023-09-30 NOTE — Unmapped (Signed)
 Called Kelly Cervantes to let her know her Hydroxyzine  tablets were sent to the clinic instead of her home address.   Will now have to mail them to her as she is unable to pick them up.  Asked her to call [facility] and let them know of the error. Stated understanding.

## 2023-10-03 NOTE — Unmapped (Signed)
 Kelly Cervantes returned call. States the address we have for her is correct. Gave her the phone number for [facility] to call and get her address verified and find out when her medications will be sent.

## 2023-10-03 NOTE — Unmapped (Signed)
 Called [facility] pharmacy to let them kow that the clinic has now received Kelly Cervantes's Buspar  as well as her hydroxyzine . Had called Carletha on Friday 6/6 to let her know and to have her call [facility] as well.   Tried calling Aarthi this morning. No answer.   Left message with [facility]'s call back number.   [phone]

## 2023-10-06 NOTE — Unmapped (Signed)
 RD called pt to re-engage in nutritional counseling for weight management (obesity class I). Pt reports that since initial nutrition visit she lost 5# but has since stalled with her progress due to loss of appetite, increased stress due to recent loss of grandchild. Pt previously was following a stable meal schedule and increasing fiber and protein-rich foods and would like to get back to eating this way.    Today, RD discussed:  -reduction of red meats and replacement with leaner protein choices  -stable meal schedule  -increasing protein intake  -importance of exercise for physical and mental health    RD gave both written and oral education today on weight management. RD will send protein, fiber and understanding food labels to pt address on file.     RD and pt will re-engage in 6 weeks and let pt know that  [facility] has support staff that can help with mental health needs. RD will contact SW team to let them know.     Next nutrition visit: July 24th at 1230pm-telephone visit     Time of Intervention: 11 minutes    Kelly Caulk, MS, RD, LDN

## 2023-10-10 NOTE — Unmapped (Signed)
 Referral Services Note     Duration of Intervention: 30 minutes    TYPE OF CONTACT: Phone and Epic Chat with Provider(s): Kelly Cervantes, Dietician     ASSESSMENT: Patient requested assistance with locating therapist in her area due to the loss of her grandchild.     INTERVENTION:  CM provided the following information via MyChart:    It was a pleasure speaking with you today! Below is a list of therapist in your area that can do virtual or in-person counseling. Please give each therapist a call to verify eligibility and and availability.     Kelly Cervantes   B. [address] Kelly Cervantes  Kelly Cervantes, Kelly [place] [address]  [phone], [place], Kelly Cerv[place] [address]  [phone]    Kelly Cervantes   [phone]     Dr. Trilby Cervantes. [facility]  [address] [place], Kelly [place] [address] map  [phone]    If you have any additional questions please give us  a call back at [phone]. Have a great week!     PLAN:  CM will follow-up as needed with additional information/resources.     Kelly Cervantes MPH, Care Manager

## 2023-10-12 NOTE — Unmapped (Unsigned)
 TELENEUROLOGY REPORT    Prior visit with this Neurologist:  08/25/2023  [facility]: Dr Margie Sheller.             ICD-10-CM   1. Other migraine without status migrainosus, not intractable  G43.809   2. Spells of decreased attentiveness  R68.89   3. OSA on CPAP  G47.33   4. Hypertension, unspecified type  I10       Kelly Cervantes is a 53 y.o. female who presents for evaluation of headaches since 2010, currently with moderate to severe throbbing headaches lasting more than 4 hours associated with nausea +/- vomiting and/or photophobia and photophobia, that met International Classification of Headache Disorders criteria for Chronic Migraine. With EMGALITY  prophylaxis, converted to episodic migraine without side effects.     Since resuming Emgality , headaches are less than 1-2 per week with effective lasting relief with Nurtec. Previously headaches were lasting 2 hours to 14 days.   Will continue EMGALITY  every 4 weeks and PRN Nurtec, no more than 1 dose in 24 hours.   She has lost weight and her CPAP is not working (50 lbs)- counseled on Sleep Study with CPAP titration (see AVS for details).   Followup on EEG scheduling for spells.   Brain MRI reviewed: Will request review of pituitary by neuroradiology. White matter changes are c/w history of elevated BP,  and OSA thus OSA, BP management is a critical target for brain wellness as well as adherence with medication regimen.     The ADDITIONAL WORKUP required includes at additional testing:  Orders Placed This Encounter   Procedures    Polysomnography (with CPAP)     HEALTH EDUCATION/HEALTH LITERACY: Counseling included, in part, the following: I reviewed my impression and findings and the plan of care in detail, allowing for time to address questions and concerns to the patient's stated satisfaction. Reviewed the differential diagnosis, plan of care in detail, as well as other elements as detailed above. The benefits and risks of each of the above options, including procedures if appropriate, benefits and alternative options were reviewed with the patient. Headache calendar- discussed- to bring at next visit. This will help us  track for improvement over time and any necessary modifications to the treatment plan.     Continuity of care: I am planning to provided needed longitudinal care that are part of the ongoing, longitudinal care relationship related to a patient???s single, serious condition or complex condition(s) The primary encounter diagnosis was Other migraine without status migrainosus, not intractable. Diagnoses of Spells of decreased attentiveness, OSA on CPAP, and Hypertension, unspecified type were also pertinent to this visit.. The patient's care will require a physician-patient relationship that long-term helps the patient with improved care, reduced burden of symptoms, medication management and overall improvement in QOL.     Return in about 6 months (around 04/13/2024) for Video OK; sooner PRN.    Follow-up plans for chronic migraines and evaluation of fainting episodes.   - PSG/OSA titration  -72-hour EEG to evaluate for seizures.  - Review MRI with Neurorads for pituitary review.          Subjective      The history is obtained from the patient. Additional history is obtained from review of available medical records. Open-ended and close-ended questions, as well as review of EPIC chart, including CareEverywhere if available.    The patient is unaccompanied.    Kelly Cervantes is a 53 y.o. right handed female seen in initial followup to original consultation  at the [facility] Department of General Internal Medicine Outpatient Clinics at the request of Dr. Alene Husk, Kayren Pass, MD for Neurological Consultation of: several neurological symptoms, referred for migraine management which she has suffered with since 2005. She also reported spells. Please see prior notes for details of that visit.     Here for:   Follow-up plans for chronic migraines and evaluation of fainting episodes.   MRI brain results.  72-hour EEG to evaluate for seizures.    Since last visit:     Headaches:   Resumed EMGALITY . No side effects.   1-2 migraine per week.   Nurtec works well.     Spells:  No more spells.   Has yet to schedule Home Sleep Study and EEG.   Brain MRI unremarkable.     Medications:     Prophylaxis:  Nurtec every other day within the past month.   Botox in April 2022 for 3 treatments. Helped. She moved and this was discontinued ([place]).   Zonisamide - Started in 01/2021. just recently stopped after 1 year- did not help.   Injections in head and neck- last time was 3 months.   Lyrica  75 mg BID  Sertraline 150 mg daily  Wellbutrin 150 mg  Trazodone   Verapamil CR 240 mg  Hydrochlorothiazide   Amlodipine   Lisinopril  Nortriptyline  Venlafaxine  Topiramate  Gabapentin  Emgality - for a while. HELPED.  INSURANCE required that stop. RESUMED 08/25/2023    Allegra    Abortive:   Nurtec. Helped (2023) Prescribed by Dr. At [facility].   Rizatriptan . Helps alleviate the pain.   Ibuprofen 800 MG GI side effects NOT ALLERGIC  Tizanidine . Helped for a while. Discontinued due to poor efficacy.   Sumatriptan  20 MG NS  Sumatriptan  50 MG pills  Baclofen   Zofran  Cold packs  Fioricet    RESCUE:   promethazine  (PHENERGAN ) 25 MG suppository; Insert 1 suppository (25 mg total) into the rectum every six (6) hours as needed for nausea.  Dispense: 12 suppository; Refill: 1    Caffeine: 2 cups of coffee daily  Diet: Ginger ale. Drinks 3 to 4 bottles water daily  Exercise: sometimes  Depression: yes; Anxiety: yes  Other pain: no  Sleep: Poor. Takes trazodone  2 hours to work. Still wakes up in middle of night   HEADACHE as soon as she gets out of bed. Typically present in the AM.   Limits ability to function. Duration: 2 hours with medication and 2 weeks at longest.   Location: bitemporal; neck pain; shoulders pain.   Quality: throbbing; gradually squeezes  Context: in 2005 had multiple stressors.   Associated: N, V, Light and noise sensitivity.   Has to lie down in complete darkness.     Sleep:   OSA diagnosed in 2022. Not using CPAP regularly. Has since lost 47 lbs.   FAMILY HISTORY: Mother: Migraines.  PSH: Liver tumor removed 2022  ROS: has gained 100 lbs in the last 3 years (After liver cancer treatment). Has lost ~ 50 lbs.  She is concerned about her weight gain and its impact on her health.    Objective   GENERAL PHYSICAL EXAM:    Vital Signs: Televisit  BP Readings from Last 6 Encounters:   09/14/23 158/90   08/25/23 127/91   08/01/23 135/85   07/20/23 135/85   07/12/23 134/87   06/01/23 160/96     General Appearance: The patient was well-groomed and neat, engaged and cooperative.  Head:  Atraumatic, normocephalic.    Neurological Exam  Mental Status  Awake and alert. Oriented to person, place and time. Speech is normal. Language is fluent with no aphasia. Attention and concentration are normal.    Medical Decision Making and Data review:   I have independently reviewed the Medical record, including Nursing notes, social determinants of health, prior available records (CareEverywhere where available/appropriate). I have obtained and reviewed the history, performed an appropriate examination.  I have reviewed the patients problem list, medical history, surgical history, laboratory history and recent hospitalizations, current medications, allergies, and social history and updated them as needed.  CT 01/2020- reported in notes from [facility] 10/22- normal.  Results for orders placed or performed during the hospital encounter of 09/06/23   MRI Brain W Wo Contrast   EXAM: Magnetic resonance imaging, brain, without contrast material.  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 contrast.    Field strength: 1.5T    FINDINGS:      Total microhemorrhages (based on SWI): None.    Superficial siderosis: None.    T2/FLAIR sulcal hyperintensities: None    No evidence of significant parenchymal volume loss. There scattered foci of periventricular and deep white matter signal abnormality, which are nonspecific but commonly associated with chronic microangiopathic white matter disease. Ventricles are normal in size. There is no midline shift. No extra-axial fluid collection. No evidence of intracranial hemorrhage. No diffusion weighted signal abnormality to suggest acute infarct. No mass.    Fazekas scale:  Deep white matter (DWM): 1 = punctate foci  Periventricular white matter (PVWM): 2 = smooth halo     Impression   -- No acute intracranial abnormalities.   -- Mild chronic microangiopathic white matter disease.  -- Total microhemorrhages: None  -- Superficial siderosis is Not detected      I have reviewed the diagnosis, clinical findings and plan of care in layman's terms, ensuring that the patient has had an opportunity to address any/all questions and/or concerns to their reported satisfaction. Counseling provided included: discussing my clinical reasoning and clinical impression, reviewing a differential diagnosis and a review of my recommendations for a plan of treatment as documented above. In addition, time was provided to address alternative options for care, health literacy regarding the differential diagnosis, testing and medication options, the benefits of current plan. A brief written summary was provided to the patient at the time of the visit, as well as instructions on how to access My Chart. My contact information was provided along with instructions on how to reach us . The patient was also instructed to contact the referring provider, PCP or our team, for any worsening or changing symptoms.      The patient reports they are physically located in [place]  and is currently: at home. I conducted a audio/video visit. I spent  87m 01s on the video call with the patient. I spent an additional 9 minutes on pre- and post-visit activities on the date of service .     Dr. Kallie Orem, FAAN  Board Certified: Adult Neurology & Vascular Neurology  Department of Internal Medicine & Department of Neurology  CC: Karlton Overly, MD  Dictated using voice-activated software with or without AI Scribe (with patient's verbal consent). Despite editing, typographical errors may exist. patient was also instructed to contact the referring provider, PCP or our team, for any worsening or changing symptoms.    In addition to Medical Decision Making, the following is noted I personally spent 76 minutes face-to-face and non-face-to-face in the care of this patient, which includes all pre, intra, and post visit time on the date of service.  All documented time was specific to the E/M visit and does not include any procedures that may have been performed. Extensive history required extensive review of CareEverywhere, using Search function to help identify prior successes and opportunities for treatment options.     Dr. Kallie Orem, FAAN  Board Certified: Adult Neurology & Vascular Neurology  Department of Internal Medicine & Department of Neurology    CC: Karlton Overly, MD  Dictated using voice-activated software with or without AI Scribe (with patient's verbal consent). Despite editing, typographical errors may exist.

## 2023-10-13 DIAGNOSIS — G43809 Other migraine, not intractable, without status migrainosus: Principal | ICD-10-CM

## 2023-10-13 DIAGNOSIS — G4733 Obstructive sleep apnea (adult) (pediatric): Principal | ICD-10-CM

## 2023-10-13 DIAGNOSIS — R6889 Other general symptoms and signs: Principal | ICD-10-CM

## 2023-10-13 DIAGNOSIS — I10 Essential (primary) hypertension: Principal | ICD-10-CM

## 2023-10-13 NOTE — Unmapped (Signed)
 Here is a brief summary of our discussion today:      Continue EMGALITY  every 4 weeks.   Use NuRTEC as needed- typically it will take a while to kick in and remain effective for up to 2 days. Avoid using more than 1 dose in 24 hours.   Schedule Sleep study at your convenience and bring your CPAP machine with you to that study.   Schedule EEG at your convenience (See below for phone #).   Monitor your BP closely.     It has been my pleasure to participate in your care.     Warmest Regards,    Dr. Kallie Orem  Board Certified: Adult Neurology & Vascular Neurology  [facility] Department of Internal Medicine  [facility] Department of Neurology  [place], [place]    Please note: All results will be released immediately in MyChart. Please allow 2 days for me to review them and send an interpretation.  Do not use MyChart for urgent issues.     Frequently Requested Information:  Internal Medicine Clinic:    [phone]   Fax: [phone]  Geriatric Specialty Clinic:  [phone]   Fax: [phone]   After Hours: [phone].        Scheduling Tests or Infusions? (Please disregard any not useful to you)   MRI scan, CT scan, DAT/PET Scan:  [phone]  EMG [phone]  (DONE AT [facility] MAIN CAMPUS ONLY)  EEG: [phone]  (DONE AT [facility] MAIN CAMPUS ONLY)  Evoked Potentials [phone]  (DONE AT [facility] MAIN CAMPUS ONLY)  Autonomic Testing [phone]  (DONE AT [facility] MAIN CAMPUS ONLY)  Infusion Center at [facility]: [phone]  Sleep Study: [phone]   Memory/Cognitive testing: [phone]   Myelogram CT: There are 2 steps for this test that must be scheduled in this sequence: FIRST: Myelogram: [phone], THEN schedule CT (ideally same day) [phone] option 2    Need help paying for medications, the cost of care or a procedure estimate?   [place] Financial Assistance Estimates  [phone]- option 3 Estimates Help - [facility] (unchealthcare.org)   Mdsave.com: for help with estimate for procedures like MRI or CT near you.   Mail Order Pharmacies: [facility]  [phone]. Refills: [phone] Option 1  Automated Touchtone Prescription Refill System. Option 2  Speak with a Representative (M-F 8-4:30 PM)  Costplusdrugs.com  Pharmacy.amazon.com  COUPONS/CoPay assistance:GoodRx.com  or MANUFACTURER Co-Pay card    LEARN MORE ABOUT YOUR BRAIN and NEUROLOGICAL HEALTH:   www.brainandlife.org Regular issue from [employer]  Stroke Recovery and Support (RingtoneFundraiser.se)  Learn more about memory disorders.   Maintaining your brain health: TonerProviders.gl  Reducing your risk for dementia: http://www.smith-williams.com/  RESEARCH OPPORTUNITIES: PopularFlicks.co.nz.        Thank you for choosing [facility]: Changing Lives for the Better.      Here is a brief summary of our discussion today:          It has been my pleasure to participate in your care.     Warmest Regards,    Dr. Kallie Orem  Board Certified: Adult Neurology & Vascular Neurology  [facility] Department of Internal Medicine  [facility] Department of Neurology  [place], [place]    Please note: All results will be released immediately in MyChart. Please allow 2 days for me to review them and send an interpretation.  Do not use MyChart for urgent issues.     Frequently Requested Information:  Internal  Medicine Clinic:    [phone]   Fax: [phone]  Geriatric Specialty Clinic:  [phone]   Fax: [phone]   After Hours: [phone].        Scheduling Tests or Infusions? (Please disregard any not useful to you)   MRI scan, CT scan, DAT/PET Scan:  [phone]  EMG [phone]  (DONE AT [facility] MAIN CAMPUS ONLY)  EEG: [phone]  (DONE AT [facility] MAIN CAMPUS ONLY)  Evoked Potentials [phone]  (DONE AT [facility] MAIN CAMPUS ONLY)  Autonomic Testing [phone] (DONE AT [facility] MAIN CAMPUS ONLY)  Infusion Center at [facility]: [phone]  Sleep Study: [phone]   Memory/Cognitive testing: [phone]   Myelogram CT: There are 2 steps for this test that must be scheduled in this sequence: FIRST: Myelogram: [phone], THEN schedule CT (ideally same day) [phone] option 2    Need help paying for medications, the cost of care or a procedure estimate?   [place] Financial Assistance Estimates  [phone]- option 3 Estimates Help - [facility] (unchealthcare.org)   Mdsave.com: for help with estimate for procedures like MRI or CT near you.   Mail Order Pharmacies: [facility] [place] [facility]  [phone]. Refills: [phone] Option 1  Automated Touchtone Prescription Refill System. Option 2  Speak with a Representative (M-F 8-4:30 PM)  Costplusdrugs.com  Pharmacy.[email or web address]  COUPONS/CoPay assistance:GoodRx.com  or MANUFACTURER Co-Pay card    LEARN MORE ABOUT YOUR BRAIN and NEUROLOGICAL HEALTH:   www.brainandlife.org Regular issue from [employer]  Stroke Recovery and Support (RingtoneFundraiser.se)  Learn more about memory disorders.   Maintaining your brain health: TonerProviders.gl  Reducing your risk for dementia: http://www.smith-williams.com/  RESEARCH OPPORTUNITIES: PopularFlicks.co.nz.        Thank you for choosing [facility]: Changing Lives for the Better.

## 2023-10-24 DIAGNOSIS — F329 Major depressive disorder, single episode, unspecified: Principal | ICD-10-CM

## 2023-10-24 DIAGNOSIS — F4310 Post-traumatic stress disorder, unspecified: Principal | ICD-10-CM

## 2023-10-24 DIAGNOSIS — F411 Generalized anxiety disorder: Principal | ICD-10-CM

## 2023-10-24 MED ORDER — BUPROPION HCL XL 150 MG 24 HR TABLET, EXTENDED RELEASE
ORAL_TABLET | Freq: Every day | ORAL | 0 refills | 30.00000 days | Status: CP
Start: 2023-10-24 — End: 2023-11-23

## 2023-10-24 MED ORDER — TRAZODONE 50 MG TABLET
ORAL_TABLET | Freq: Every evening | ORAL | 0 refills | 30.00000 days | Status: CP
Start: 2023-10-24 — End: 2023-11-23

## 2023-10-24 MED ORDER — HYDROXYZINE HCL 50 MG TABLET
ORAL_TABLET | Freq: Two times a day (BID) | ORAL | 0 refills | 30.00000 days | Status: CP | PRN
Start: 2023-10-24 — End: 2024-01-22

## 2023-10-24 MED ORDER — BUSPIRONE 15 MG TABLET
ORAL_TABLET | Freq: Two times a day (BID) | ORAL | 0 refills | 30.00000 days | Status: CP
Start: 2023-10-24 — End: 2023-11-23

## 2023-10-24 MED ORDER — FLUOXETINE 40 MG CAPSULE
ORAL_CAPSULE | Freq: Every day | ORAL | 0 refills | 30.00000 days | Status: CP
Start: 2023-10-24 — End: ?

## 2023-10-24 NOTE — Unmapped (Signed)
 Follow-up instructions:  -- START Wellbutrin 150 mg each day.  - Please continue taking your medications as prescribed for your mental health.   - Do not make changes to your medications, including taking more or less than prescribed, unless under the supervision of your physician. Be aware that some medications may make you feel worse if abruptly stopped  - Please refrain from using illicit substances, as these can affect your mood and could cause anxiety or other concerning symptoms.   - Seek further medical care for any increase in symptoms or new symptoms such as thoughts of wanting to hurt yourself or hurt others.     Contact info:  Life-threatening emergencies: Call 911, the 988 suicide and crisis lifeline, or go to the nearest ER for medical or psychiatric attention.           Issues that need urgent attention but are not life threatening: Call the clinic outpatient front desk for assistance:    - [phone] for [facility] adult psychiatry clinics located at [address] [facility]  - [phone] for [facility] child and adolescent psychiatry clinics located at [address]  - [phone] for [facility] and adult psychiatry clinics located at [address] [facility]    Non-urgent routine concerns and questions: Send a message through [facility] or call our clinic front desk.    Refill requests: Check with your pharmacy to initiate refill requests.    Regarding appointments:  - If you need to cancel your appointment, we ask that you call your clinic at least 24 hours before your scheduled appointment at the numbers listed above.  - If for any reason you arrive 15 minutes later than your scheduled appointment time, you may not be seen and your visit may be rescheduled.  - Please remember that we will not automatically reschedule missed appointments.  - If you miss two (2) appointments without letting us  know in advance, you will likely be referred to a provider in your community.  - We will do our best to be on time. Sometimes an emergency will arise that might cause your clinician to be late. We will try to inform you of this when you check in for your appointment. If you wait more than 15 minutes past your appointment time without such notice, please speak with the front desk staff.    In the event of bad weather, the clinic staff will attempt to contact you, should your appointment need to be rescheduled. Additionally, you can call the Patient Weather Line [phone] for system-wide clinic status    For more information and reminders regarding clinic policies (these were provided when you were admitted to the clinic), please ask the front desk.
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
## 2023-10-25 NOTE — Unmapped (Signed)
This was a telehealth service where a resident was involved. I was off site and immediately available via telephone/pager. I did not see the patient. I reviewed and discussed the encounter with the resident. I am in agreement with the assessment and plan as documented.      Laurin Coder, MD

## 2023-10-26 DIAGNOSIS — F411 Generalized anxiety disorder: Principal | ICD-10-CM

## 2023-10-26 MED ORDER — BUSPIRONE 15 MG TABLET
ORAL_TABLET | Freq: Two times a day (BID) | ORAL | 90.00000 days
Start: 2023-10-26 — End: ?

## 2023-10-26 MED ORDER — HYDROXYZINE HCL 50 MG TABLET
ORAL_TABLET | Freq: Two times a day (BID) | ORAL | 90.00000 days | PRN
Start: 2023-10-26 — End: ?

## 2023-10-26 MED ORDER — FLUOXETINE 40 MG CAPSULE
ORAL_CAPSULE | Freq: Every day | ORAL | 3 refills | 90.00000 days
Start: 2023-10-26 — End: ?

## 2023-10-26 MED ORDER — CELECOXIB 200 MG CAPSULE
ORAL_CAPSULE | Freq: Two times a day (BID) | ORAL | 2 refills | 30.00000 days | Status: CP | PRN
Start: 2023-10-26 — End: 2024-10-25

## 2023-10-26 NOTE — Unmapped (Signed)
 Called and spoke to [employer].  [employer] is covered.  Reference number is [id number].

## 2023-10-26 NOTE — Unmapped (Signed)
 Called patient to make an appointment for gel injection with Dr. Gari in sports med.

## 2023-10-26 NOTE — Unmapped (Signed)
 I was the supervising attending present and available in clinic during this patient's visit.   I have reviewed the patient's medical record, and physically interviewed and examined the patient and agree with the assessment and plan as documented in the resident's note.     Rolm Bookbinder, MD

## 2023-10-26 NOTE — Unmapped (Signed)
 [facility] NOTE     10/26/2023  PCP: Myrna Velia Canales, MD       [facility] is a 53 y.o. female that presents to clinic today regarding the following issues:    Assessment/ Plan:  ASSESSMENT/PLAN:     Problem List Items Addressed This Visit       Localized osteoarthritis of right knee    Relevant Medications    celecoxib (CELEBREX) 200 MG capsule    Other Relevant Orders    AMB REFERRAL TO PHYSICAL THERAPY     Other Visit Diagnoses         Left anterior knee pain    -  Primary    Relevant Medications    celecoxib (CELEBREX) 200 MG capsule    Other Relevant Orders    AMB REFERRAL TO PHYSICAL THERAPY      Bilateral wrist pain        Relevant Medications    celecoxib (CELEBREX) 200 MG capsule    Other Relevant Orders    AMB REFERRAL TO PHYSICAL THERAPY                  Assessment/Plan:             Patient presents with chronic right knee pain previously diagnosed with mild osteoarthritis but now with progressively worsening left knee pain and bilateral wrist tenderness.  Her left knee pain may be secondary to osteoarthritis given the history and the right knee long with weakness in the morning but also includes patellofemoral pain syndrome versus overuse injury given her recent increase in activity.  Similarly, her wrist pain is secondary to overuse in setting of her recent weight loss program. At this time I do not believe she would benefit from an injection thus discussed conservative measures including starting Celebrex and physical therapy to assess and strengthen both of her knee and wrists.  She was also provided with home exercise program.  We will follow-up again in clinic once PT is concluded.  Discussed plan with patient who is in agreement.      ==================  Yvonna Collum MD  [facility] Pediatrics PGY-3        SUBJECTIVE:         Chief Complaint   Patient presents with    Knee Pain     Pain and swelling in the left knee        History of Present Illness      History of Present Illness            [facility] is a 53 y.o. female with osteoarthritis of the R knee who presents for progressively worsening pain in her L knee. She was previously seen in clinic on 07/13/2023 where she was diagnosed with right knee osteoarthritis.  About 1 week after that visit, she reports she began having pain in her left knee.  She initially attributed to changes in sleeping as she was starting to favor her left leg given the right knee pain but the pain has progressed to the point is now worse than her right knee pain.  She reports that the pain is an ache at baseline but then will progress feeling sharp or like someone sticking pins in her knee.  The worst location is in the superior knee along the quadriceps tendon.  The pain will wake her at night.  She also endorses both medial and lateral knee pain and feeling pops along her joint line 2-3 times  a week.  Finally she endorses left knee weakness, especially in the a.m., and that the knee will occasionally just give out on her.  She is attempted to use OTC gels, Tylenol, Motrin, and an arthritic powder similar to Goody's powder but with minimal relief.    Otherwise she also complains of bilateral wrist tenderness when attempting to do exercises such as push-ups and weightlifting.  She was attempting to perform a weight loss program but due to her inability to perform the exercises she had to stop.  Denies any feelings of numbness, burning, or tingling in her wrists.  The pain is only with the exercises themselves.      HISTORY:  I have reviewed the patients problem list, current medications, allergies, past medical & surgical history, social history and updated them as needed.    Patient's last menstrual period was 10/02/2017.  Allergies[1]      Review of Systems    Review of Systems   All other systems reviewed and are negative.            OBJECTIVE:  Vitals:    10/26/23 0956   Weight: 88.5 kg (195 lb 3.2 oz)       Physical Exam            Physical Exam  Constitutional:       Appearance: Normal appearance.   Pulmonary:      Effort: Pulmonary effort is normal.     Musculoskeletal:      Right knee: Normal range of motion. Tenderness present.      Instability Tests: Anterior Lachman test negative.      Left knee: No swelling or effusion. Decreased range of motion (decreased knee flexion 2/2 pain). Tenderness (tenderness in superior knee on the quadriceps tendon and at the insertion. Pain with knee and hip flexion) present over the medial joint line and lateral joint line. No ACL laxity.     Instability Tests: Anterior Lachman test negative.     Skin:     General: Skin is warm and dry.     Neurological:      Mental Status: She is alert.           Return if symptoms worsen or fail to improve.    Medical decision making for this encounter was Low complexity.                                                                                                                                        [facility]  at [facility]  [address], [place], [place] [address]  Telephone [phone]  Fax [phone]  [email or web address]          [1]   Allergies  Allergen Reactions    Ibuprofen Hives     Does NOT have a problem with diclofenac  1% gel  Naproxen Sodium Hives     Does NOT have a problem with diclofenac  1% gel    Naproxen

## 2023-11-16 DIAGNOSIS — Z21 Asymptomatic human immunodeficiency virus [HIV] infection status: Principal | ICD-10-CM

## 2023-11-16 MED ADMIN — cabotegravir-rilpivirine (CABENUVA) 600 mg-900 mg/3 mL extended-release injection 6 mL: 6 mL | INTRAMUSCULAR | @ 19:00:00 | Stop: 2023-11-16
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
## 2023-11-16 NOTE — Unmapped (Signed)
 Patient here for [place]enu[place]  injections. Denies changes in mood or sleep. Tolerated well. Needle size: 23G 1 1/2 Next appointment made for 9/17.

## 2023-11-17 DIAGNOSIS — F411 Generalized anxiety disorder: Principal | ICD-10-CM

## 2023-11-17 MED ORDER — TRAZODONE 50 MG TABLET
ORAL_TABLET | Freq: Every evening | ORAL | 0 refills | 30.00000 days | Status: CP
Start: 2023-11-17 — End: 2023-12-17

## 2023-11-17 MED ORDER — HYDROXYZINE HCL 50 MG TABLET
ORAL_TABLET | Freq: Two times a day (BID) | ORAL | 0 refills | 30.00000 days | Status: CP | PRN
Start: 2023-11-17 — End: 2024-02-15

## 2023-11-17 MED ORDER — BUSPIRONE 15 MG TABLET
ORAL_TABLET | Freq: Two times a day (BID) | ORAL | 0 refills | 30.00000 days | Status: CP
Start: 2023-11-17 — End: 2023-12-17

## 2023-11-17 MED ORDER — BUPROPION HCL XL 150 MG 24 HR TABLET, EXTENDED RELEASE
ORAL_TABLET | Freq: Every day | ORAL | 0 refills | 30.00000 days | Status: CP
Start: 2023-11-17 — End: 2023-12-17

## 2023-11-17 MED ORDER — FLUOXETINE 40 MG CAPSULE
ORAL_CAPSULE | Freq: Every day | ORAL | 0 refills | 30.00000 days | Status: CP
Start: 2023-11-17 — End: ?

## 2023-11-17 NOTE — Unmapped (Signed)
 Goals for next appointment:   Try new dinner and breakfast recipes to add to meals to prepare:  [email or web address].[email or web address]-[email or web address]  Greek yogurt http://www.mitchell.com/  Chicken dish: https://www.moore-west.com/  Prioritize a protein and fiber source at each meal and snack:  String cheese and grapes  Nuts with apple slices  1 slice of wheat toast with peanut butter  Air-popped popcorn  Cheerios with low-fat milk and banana slices  Increase water consumption to at least 80 ounces per day.  Use exercise band at least 3x per week for 10-15 minutes.

## 2023-11-17 NOTE — Unmapped (Addendum)
 [facility]    Adult Medical Nutrition Therapy - Initial Assessment/Care Plan      Date: 11/17/2023    Patient Name: Kelly Cervantes    Date of Birth / Age / Birth Gender: 1970/09/03 / 53 y.o. / Female    Referring MD or Clinic: Self, Referred    Interpreter: No    Reason for Referral: General, healthful diet order yes: weight management and weight reduction    Nutritional Concerns/Comments: 53 y.o. female reports to initial nutrition visit with BMI of 30.38 kg/m2 and  has a past medical history of Anemia (2010), Anxiety (2014), Arthritis (2019), Bipolar 1 disorder (04/13/2012), Clotting disorder (HHS-HCC) (2005), Depression (2009), HIV disease (2009), Hyperlipidemia (03/09/2023), Hypertension (2014), Neuromuscular disorder (2022), and Obesity (2022). seeking nutritional counseling to mitigate elevated weight.     Since initial nutrition visit, pt has lost 6 lbs but notes a plateau in her weight loss in the past couple weeks. Pt reports bilateral knee pain which impacts her physical activity level and ultimately her weight. Regarding her intake, she reports monotony with her meal choices and feels stuck with what to make.     Today, RD and pt discussed:  -meal ideas  -protein sources  -fiber sources  -water intake  -physical activity    DIET: If pt eats breakfast, she will have sausage, egg white sandwich. For lunch, pt will have another malawi or peanut butter and jelly sandwich. For dinner, pt will have baked pork chops, green beans, macaroni and cheese. Snacks include: chips, grapes, crackers. Beverages:     PHYSICAL ACTIVITY: None reported. Pt reported bilateral knee pain. Pt wants to join neighborhood gym.    FOOD INSECURITY: Pt screens positive. Pt receives $322 from [facility] program. Pt does not access a food pantry. RD educated pt on Healthy Helpings Program through [employer] and [employer] and [employer].    Anthropometric Data:    Height: 5'7  Wt: 194 lbs  BMI: 30.38 kg/m2    Weight History:  Wt Readings from Last 6 Encounters:   11/16/23 88 kg (194 lb)   10/26/23 88.5 kg (195 lb 3.2 oz)   10/13/23 88.5 kg (195 lb)   09/14/23 88.7 kg (195 lb 9.6 oz)   08/25/23 91 kg (200 lb 9.6 oz)   08/01/23 89.1 kg (196 lb 6.4 oz)       Physical Findings Data:     BP Today: N/A (telehealth visit)  BP History:  BP Readings from Last 6 Encounters:   11/16/23 125/83   09/14/23 158/90   08/25/23 127/91   08/01/23 135/85   07/20/23 135/85   07/12/23 134/87       Biochemical Data:  A1C:    Hemoglobin A1C   Date Value Ref Range Status   06/01/2023 5.2 4.8 - 5.6 % Final     Estimated Blood Glucose:   Lab Results   Component Value Date    Estimated Average Glucose 103 06/01/2023     Total Cholesterol:   Cholesterol, Total   Date Value Ref Range Status   03/30/2023 148 <=200 mg/dL Final     Trig:   Triglycerides   Date Value Ref Range Status   03/30/2023 81 0 - 150 mg/dL Final     HDL:   Cholesterol, HDL   Date Value Ref Range Status   03/30/2023 63 (H) 40 - 60 mg/dL Final      LDL:   Cholesterol, LDL, Calculated   Date Value Ref  Range Status   03/30/2023 69 40 - 99 mg/dL Final     Comment:     NHLBI Recommended Ranges, LDL Cholesterol, for Adults (20+yrs) (ATPIII), mg/dL  Optimal              <899  Near Optimal        100-129  Borderline High     130-159  High                160-189  Very High            >=190  NHLBI Recommended Ranges, LDL Cholesterol, for Children (2-19 yrs), mg/dL  Desirable            <889  Borderline High     110-129  High                 >=130         Current Medications, Herbs, Supplements:    Current Outpatient Medications:     albuterol  HFA 90 mcg/actuation inhaler, Inhale 2 puffs every six (6) hours as needed., Disp: , Rfl:     amlodipine  (NORVASC ) 5 MG tablet, Take 1 tablet (5 mg total) by mouth daily., Disp: 30 tablet, Rfl: 11    atorvastatin  (LIPITOR) 20 MG tablet, Take 1 tablet (20 mg total) by mouth daily., Disp: 90 tablet, Rfl: 3    budesonide-formoterol (SYMBICORT ) 160-4.5 mcg/actuation inhaler, Inhale 2 puffs  in the morning and 2 puffs in the evening., Disp: 10.2 g, Rfl: 11    buPROPion  (WELLBUTRIN  XL) 150 MG 24 hr tablet, Take 1 tablet (150 mg total) by mouth daily., Disp: 30 tablet, Rfl: 0    busPIRone  (BUSPAR ) 15 MG tablet, Take 1 tablet (15 mg total) by mouth two (2) times a day., Disp: 60 tablet, Rfl: 0    cabotegravir -rilpivirine  (CABENUVA ) 600 mg-900 mg/3 mL extended-release injection, Inject 6 mL into the muscle every 8 weeks., Disp: 6 mL, Rfl: 6    celecoxib  (CELEBREX ) 200 MG capsule, Take 1 capsule (200 mg total) by mouth two (2) times a day as needed for pain., Disp: 60 capsule, Rfl: 2    FLUoxetine  (PROZAC ) 40 MG capsule, Take 2 capsules (80 mg total) by mouth daily., Disp: 60 capsule, Rfl: 0    galcanezumab -gnlm (EMGALITY ) 120 mg/mL injection, Inject 2 pens (240 mg) under the skin every twenty-eight (28) days for 28 days, THEN 1 pen (120 mg) every twenty-eight (28) days., Disp: 4 mL, Rfl: 3    hydrOXYzine  (ATARAX ) 50 MG tablet, Take 1 tablet (50 mg total) by mouth two (2) times a day as needed for anxiety., Disp: 60 tablet, Rfl: 0    omeprazole  (PRILOSEC) 40 MG capsule, Take 1 capsule (40 mg total) by mouth two (2) times a day., Disp: 180 capsule, Rfl: 2    promethazine  (PHENERGAN ) 25 MG suppository, Insert 1 suppository (25 mg total) into the rectum every six (6) hours as needed for nausea., Disp: 12 suppository, Rfl: 1    rimegepant (NURTEC ODT ) 75 mg TbDL, Take 1 tablet (75 mg total) by mouth every other day., Disp: 15 tablet, Rfl: 3    rimegepant (NURTEC ODT ) 75 mg TbDL, Take 1 tablet (75 mg total) by mouth once as needed at the onset of migraine. NO MORE THAN 1 dose in 24 hours., Disp: 8 tablet, Rfl: 11    traZODone  (DESYREL ) 50 MG tablet, Take 1 tablet (50 mg total) by mouth nightly., Disp: 30 tablet, Rfl: 0  No current facility-administered  medications for this visit.      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 For each statement, please tell me whether the statement was often true, sometimes true, or never true for your household in the 12 months.     1. ???We worried whether our food would run out before we got money to buy more.???   Sometimes True     2. ???The food that we bought just didn't last, and we didn't have money to get more.  Sometimes True       Usual Daily Food Choices:      24-Hour Recall/Usual Intake:  Time Intake   Breakfast 9am: egg white and bacon sandwich (whole-wheat)  Coffee with zero-sugar creamer  Bottled water   Snack (AM) Grapes  Pretzels or chips   Lunch noodles   Snack (PM)    Dinner Brien or grilled chicken or pork   Grilled porkchops, green beans and macaroni cheese   Snack (HS)        Food and Nutrient Intake:  Snacks:  grapes, chips, crackers  Beverages:  diet juice, coffee, zero sugar soda and water  Dining Out:  Cookout  Cooking Methods: grilling, boiling, steaming  Usual Food Choices: pork, beef, eggs, whole-wheat bread, rice, green beans, yogurt, malawi, chips   Meal Schedule: Variable. Sometimes doesn't eat until 1pm.    Behavioral Factors:  Overeating: No issues noted.  Emotional Eating: No issues noted.  Grazing: Frequently grazes chips, grapes, crackers.   Fast Eating: No issues noted.  Nighttime Eating: No issues noted.    Factors Affecting Food Intake:  Knowledge and Beliefs: She has not previously met with dietitian or participated in nutrition program.  She has willingness to try new foods.  Stress/Anxiety: Patient with medical history of depression., Patient with medical history of anxiety.   Sleep Patterns: Patient with obstructive sleep apnea.  Food Safety and Access: She noted limited finances and resources.  She has SNAP/EBT benefits.   Other: n/a      Food Intolerances/Dietary Restrictions:  No known food allergies or food intolerances.     Other GI Issues:  No issues noted upon assessment    Hunger and Satiety:  Denied issues.       Allergies:  is allergic to ibuprofen, naproxen sodium, and naproxen.      Usual Daily Physical Activity Factors: >/=1.0 to <1.4 (Sedentary)  Method for estimating physical activity factor: 2020-2025 DRI Guidelines    Estimated Adherence:  Client self-reported adherence score 10  Method for estimating adherence: Adult Meducation Readiness Ruler      NUTRITION DIAGNOSIS:   Inadequate  intake of grains  Inadequate  intake of fruits  Inadequate  intake of vegetables  Inadequate  intake of milk/milk products  Inadequate  intake of meat, poultry, fish, eggs, beans, nut products    Method for estimating nutritional adequacy: 2020-2025 DRI Guidelines     Clinical Diagnosis:  Overweight/obesity related to undesirable food choices, irregular meal intake as evidenced by most recent BMI of 30.38 kg/m2 and self-reported previous weight gain of ~100 lbs.            NUTRITION INTERVENTIONS/PLAN OF CARE    Nutrition Counseling   Nutrition Education     Coordination of care via Mail and Medical Record    Client agreed to follow interventions:  Yes       Nutrition-Related Medication Management: Nutrition-related complimentary/alternative medicine      Nutrition Goals:  Goals for next appointment:   Try new dinner and breakfast recipes to add to meals to prepare:  [email or web address]  Greek yogurt [email or web address]  Chicken dish: [email or web address]  3.Prioritize a protein and fiber source at each meal and snack:  String cheese and grapes  Nuts with apple slices  1 slice of wheat toast with peanut butter  Air-popped popcorn  Cheerios with low-fat milk and banana slices  4.Increase water consumption to at least 80 ounces per day.  5.Use exercise band at least 3x per week for 10-15 minutes.    Patient Education:  Individualized nutrition counseling and education provided on:   Lean Protein Sources  Fiber-Rich Foods  Water Intake    Materials Provided:  Patient instructions: Prominent nutrition related recommendations.   Handouts supporting dietary recommendations: n/a  Additional Materials: Written nutrition tips   Resistance band exercises      NUTRITION MONITORING AND EVALUATION:  Follow-up appointment: telephone/video August 28th at 100pm      Goals for next appointment:   Try new dinner and breakfast recipes to add to meals to prepare:  [email or web address]  Greek yogurt [email or web address]  Chicken dish: [email or web address]-[email or web address]  Prioritize a protein and fiber source at each meal and snack:  String cheese and grapes  Nuts with apple slices  1 slice of wheat toast with peanut butter  Air-popped popcorn  Cheerios with low-fat milk and banana slices  Increase water consumption to at least 80 ounces per day.  Use exercise band at least 3x per week for 10-15 minutes.      Length of visit was: 19.04 minutes    The patient reports they are physically located in [place]  and is currently: at home. I conducted a phone visit.  I spent 19.04 minutes on the phone call with the patient on the date of service .        Signed:  Karilyn DELENA Areola  11/17/2023 12:33 PM

## 2023-11-28 DIAGNOSIS — F411 Generalized anxiety disorder: Principal | ICD-10-CM

## 2023-11-28 MED ORDER — BUSPIRONE 15 MG TABLET
ORAL_TABLET | Freq: Two times a day (BID) | ORAL | 0 refills | 30.00000 days | Status: CP
Start: 2023-11-28 — End: 2023-12-28

## 2023-11-28 MED ORDER — HYDROXYZINE HCL 50 MG TABLET
ORAL_TABLET | Freq: Two times a day (BID) | ORAL | 0 refills | 30.00000 days | Status: CP | PRN
Start: 2023-11-28 — End: 2023-12-28

## 2023-11-30 DIAGNOSIS — E785 Hyperlipidemia, unspecified: Principal | ICD-10-CM

## 2023-11-30 MED ORDER — ATORVASTATIN 20 MG TABLET
ORAL_TABLET | Freq: Every day | ORAL | 3 refills | 100.00000 days | Status: CP
Start: 2023-11-30 — End: ?

## 2023-12-02 DIAGNOSIS — F411 Generalized anxiety disorder: Principal | ICD-10-CM

## 2023-12-02 MED ORDER — HYDROXYZINE HCL 50 MG TABLET
ORAL_TABLET | Freq: Two times a day (BID) | ORAL | 0.00000 days | PRN
Start: 2023-12-02 — End: ?

## 2023-12-02 MED ORDER — BUSPIRONE 15 MG TABLET
ORAL_TABLET | Freq: Two times a day (BID) | ORAL | 0.00000 days
Start: 2023-12-02 — End: ?

## 2023-12-05 MED ORDER — BUSPIRONE 15 MG TABLET
ORAL_TABLET | Freq: Two times a day (BID) | ORAL | 90.00000 days
Start: 2023-12-05 — End: ?

## 2023-12-05 MED ORDER — HYDROXYZINE HCL 50 MG TABLET
ORAL_TABLET | Freq: Two times a day (BID) | ORAL | 90.00000 days | PRN
Start: 2023-12-05 — End: ?

## 2023-12-09 DIAGNOSIS — F411 Generalized anxiety disorder: Principal | ICD-10-CM

## 2023-12-09 MED ORDER — FLUOXETINE 40 MG CAPSULE
ORAL_CAPSULE | Freq: Every day | ORAL | 3 refills | 90.00000 days
Start: 2023-12-09 — End: ?

## 2023-12-13 DIAGNOSIS — F411 Generalized anxiety disorder: Principal | ICD-10-CM

## 2023-12-13 MED ORDER — HYDROXYZINE HCL 50 MG TABLET
ORAL_TABLET | Freq: Two times a day (BID) | ORAL | 0.00000 days | PRN
Start: 2023-12-13 — End: ?

## 2023-12-13 MED ORDER — BUSPIRONE 15 MG TABLET
ORAL_TABLET | Freq: Two times a day (BID) | ORAL | 0.00000 days
Start: 2023-12-13 — End: ?

## 2023-12-13 MED FILL — EMGALITY PEN 120 MG/ML SUBCUTANEOUS PEN INJECTOR: SUBCUTANEOUS | 84 days supply | Qty: 3 | Fill #1

## 2023-12-14 ENCOUNTER — Encounter: Admit: 2023-12-14 | Discharge: 2023-12-14 | Payer: Medicare (Managed Care)

## 2023-12-14 DIAGNOSIS — Z79899 Other long term (current) drug therapy: Principal | ICD-10-CM

## 2023-12-14 DIAGNOSIS — Z8619 Personal history of other infectious and parasitic diseases: Principal | ICD-10-CM

## 2023-12-14 DIAGNOSIS — B20 Human immunodeficiency virus [HIV] disease: Principal | ICD-10-CM

## 2023-12-14 DIAGNOSIS — K7402 Hepatic fibrosis, stage 3: Principal | ICD-10-CM

## 2023-12-14 DIAGNOSIS — R232 Flushing: Principal | ICD-10-CM

## 2023-12-14 DIAGNOSIS — Z5181 Encounter for therapeutic drug level monitoring: Principal | ICD-10-CM

## 2023-12-14 DIAGNOSIS — Z9189 Other specified personal risk factors, not elsewhere classified: Principal | ICD-10-CM

## 2023-12-14 DIAGNOSIS — G43909 Migraine, unspecified, not intractable, without status migrainosus: Principal | ICD-10-CM

## 2023-12-14 LAB — CBC W/ AUTO DIFF
BASOPHILS ABSOLUTE COUNT: 0 10*9/L (ref 0.0–0.1)
BASOPHILS RELATIVE PERCENT: 0.6 %
EOSINOPHILS ABSOLUTE COUNT: 0.1 10*9/L (ref 0.0–0.5)
EOSINOPHILS RELATIVE PERCENT: 1.5 %
HEMATOCRIT: 41.2 % (ref 34.0–44.0)
HEMOGLOBIN: 13.7 g/dL (ref 11.3–14.9)
LYMPHOCYTES ABSOLUTE COUNT: 2.4 10*9/L (ref 1.1–3.6)
LYMPHOCYTES RELATIVE PERCENT: 47.5 %
MEAN CORPUSCULAR HEMOGLOBIN CONC: 33.3 g/dL (ref 32.0–36.0)
MEAN CORPUSCULAR HEMOGLOBIN: 26.8 pg (ref 25.9–32.4)
MEAN CORPUSCULAR VOLUME: 80.4 fL (ref 77.6–95.7)
MEAN PLATELET VOLUME: 9.1 fL (ref 6.8–10.7)
MONOCYTES ABSOLUTE COUNT: 0.3 10*9/L (ref 0.3–0.8)
MONOCYTES RELATIVE PERCENT: 5.5 %
NEUTROPHILS ABSOLUTE COUNT: 2.3 10*9/L (ref 1.8–7.8)
NEUTROPHILS RELATIVE PERCENT: 44.9 %
PLATELET COUNT: 226 10*9/L (ref 150–450)
RED BLOOD CELL COUNT: 5.12 10*12/L (ref 3.95–5.13)
RED CELL DISTRIBUTION WIDTH: 14.3 % (ref 12.2–15.2)
WBC ADJUSTED: 5.1 10*9/L (ref 3.6–11.2)

## 2023-12-14 LAB — BILIRUBIN, TOTAL: BILIRUBIN TOTAL: 0.6 mg/dL (ref 0.3–1.2)

## 2023-12-14 LAB — BASIC METABOLIC PANEL
ANION GAP: 9 mmol/L (ref 5–14)
BLOOD UREA NITROGEN: 11 mg/dL (ref 9–23)
BUN / CREAT RATIO: 13
CALCIUM: 10 mg/dL (ref 8.7–10.4)
CHLORIDE: 103 mmol/L (ref 98–107)
CO2: 28.1 mmol/L (ref 20.0–31.0)
CREATININE: 0.87 mg/dL (ref 0.55–1.02)
EGFR CKD-EPI (2021) FEMALE: 81 mL/min/1.73m2 (ref >=60)
GLUCOSE RANDOM: 98 mg/dL (ref 70–179)
POTASSIUM: 3.4 mmol/L (ref 3.4–4.8)
SODIUM: 140 mmol/L (ref 135–145)

## 2023-12-14 LAB — HIV RNA, QUANTITATIVE, PCR: HIV RNA QNT RSLT: NOT DETECTED

## 2023-12-14 LAB — ALT: ALT (SGPT): 26 U/L (ref 10–49)

## 2023-12-14 LAB — AST: AST (SGOT): 23 U/L (ref <=34)
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
## 2023-12-14 NOTE — Unmapped (Addendum)
 Please call [phone] to schedule your physical therapy appointment.     I have messaged your other providers to come up with a plan for treating your hot flashes. I will also let Dr. Hezzie know about your migraines.

## 2023-12-15 DIAGNOSIS — F4310 Post-traumatic stress disorder, unspecified: Principal | ICD-10-CM

## 2023-12-15 DIAGNOSIS — F329 Major depressive disorder, single episode, unspecified: Principal | ICD-10-CM

## 2023-12-15 DIAGNOSIS — F411 Generalized anxiety disorder: Principal | ICD-10-CM

## 2023-12-15 MED ORDER — BUPROPION HCL XL 300 MG 24 HR TABLET, EXTENDED RELEASE
ORAL_TABLET | Freq: Every day | ORAL | 1 refills | 30.00000 days | Status: CP
Start: 2023-12-15 — End: 2024-02-13

## 2023-12-15 MED ORDER — HYDROXYZINE HCL 50 MG TABLET
ORAL_TABLET | Freq: Two times a day (BID) | ORAL | 1 refills | 30.00000 days | Status: CP | PRN
Start: 2023-12-15 — End: 2024-02-13

## 2023-12-15 MED ORDER — PRAZOSIN 1 MG CAPSULE
ORAL_CAPSULE | Freq: Every evening | ORAL | 1 refills | 30.00000 days | Status: CP
Start: 2023-12-15 — End: 2024-02-13

## 2023-12-15 MED ORDER — TRAZODONE 50 MG TABLET
ORAL_TABLET | Freq: Every evening | ORAL | 1 refills | 30.00000 days | Status: CP
Start: 2023-12-15 — End: 2024-02-13

## 2023-12-15 MED ORDER — FLUOXETINE 40 MG CAPSULE
ORAL_CAPSULE | Freq: Every day | ORAL | 1 refills | 30.00000 days | Status: CP
Start: 2023-12-15 — End: ?

## 2023-12-15 MED ORDER — BUPROPION HCL XL 150 MG 24 HR TABLET, EXTENDED RELEASE
ORAL_TABLET | Freq: Every day | ORAL | 1 refills | 0.00000 days
Start: 2023-12-15 — End: ?

## 2023-12-15 MED ORDER — BUSPIRONE 15 MG TABLET
ORAL_TABLET | Freq: Two times a day (BID) | ORAL | 90.00000 days
Start: 2023-12-15 — End: ?

## 2023-12-15 NOTE — Unmapped (Signed)
 Follow-up instructions:  -- INCREASE to Wellbutrin  300 mg daily.  -- START prazosin  1 mg nightly for nightmares. Can increase to 2 mg nightly after 3-4 nights.  - Please continue taking your medications as prescribed for your mental health.   - Do not make changes to your medications, including taking more or less than prescribed, unless under the supervision of your physician. Be aware that some medications may make you feel worse if abruptly stopped  - Please refrain from using illicit substances, as these can affect your mood and could cause anxiety or other concerning symptoms.   - Seek further medical care for any increase in symptoms or new symptoms such as thoughts of wanting to hurt yourself or hurt others.     Contact info:  Life-threatening emergencies: Call 911, the 988 suicide and crisis lifeline, or go to the nearest ER for medical or psychiatric attention.           Issues that need urgent attention but are not life threatening: Call the clinic outpatient front desk for assistance:    - [phone] for [facility] adult psychiatry clinics located at [address] [facility]  - [phone] for [facility] child and adolescent psychiatry clinics located at [address]  - [phone] for [facility] and adult psychiatry clinics located at [address]    Non-urgent routine concerns and questions: Send a message through [facility] or call our clinic front desk.    Refill requests: Check with your pharmacy to initiate refill requests.    Regarding appointments:  - If you need to cancel your appointment, we ask that you call your clinic at least 24 hours before your scheduled appointment at the numbers listed above.  - If for any reason you arrive 15 minutes later than your scheduled appointment time, you may not be seen and your visit may be rescheduled.  - Please remember that we will not automatically reschedule missed appointments.  - If you miss two (2) appointments without letting us  know in advance, you will likely be referred to a provider in your community.  - We will do our best to be on time. Sometimes an emergency will arise that might cause your clinician to be late. We will try to inform you of this when you check in for your appointment. If you wait more than 15 minutes past your appointment time without such notice, please speak with the front desk staff.    In the event of bad weather, the clinic staff will attempt to contact you, should your appointment need to be rescheduled. Additionally, you can call the Patient Weather Line [phone] for system-wide clinic status    For more information and reminders regarding clinic policies (these were provided when you were admitted to the clinic), please ask the front desk.

## 2023-12-16 DIAGNOSIS — R232 Flushing: Principal | ICD-10-CM

## 2023-12-16 MED ORDER — GABAPENTIN 100 MG CAPSULE
ORAL_CAPSULE | ORAL | 0 refills | 36.00000 days | Status: CP
Start: 2023-12-16 — End: 2024-01-21

## 2023-12-19 NOTE — Unmapped (Signed)
 I saw and evaluated the patient with the resident, in person, participating in the key portions of the service.  I reviewed the resident???s note.  I agree with the resident???s findings and plan.     Zelphia Cairo, MD

## 2023-12-22 NOTE — Unmapped (Signed)
 Nutrition Goals:  Prioritize a lean protein source at each meal with a total daily goal of >100 g.  Eggs  Chicken  Greek yogurt  Beans  Peanut butter  Low-fat milk  Turkey  Fish  Shrimp  Tuna and chicken salad made with lower-fat mayonnaise  Nuts-cashews, peanuts, pecans  Protein powder  2. Reduce consumption of processed carbohydrates from breads, crackers, graham crackers, buttered potatoes, pastas and replace with whole-wheat breads, pastas, beans, brown rice, rolled oats.  3.Increase water consumption to at least 4 bottles per day.  4. Can include 1/2 to 1 cup of shelled edamame and soy milk per day to increase protein, fiber intake.  5. Continue exercising with band and walking daily.  6. Portion control tips:  -consume protein and fruit/vegetable first during meals before carbohydrates  -always consume meals sitting at a table without screens or tablets or phones

## 2023-12-22 NOTE — Unmapped (Signed)
 [facility]    Adult Medical Nutrition Therapy - Initial Assessment/Care Plan      Date: 12/22/2023    Patient Name: Kelly Cervantes    Date of Birth / Age / Birth Gender: [birth year] / 53 y.o. / Female    Referring MD or Clinic: Pcp, Information Unavai*    Interpreter: No    Reason for Referral: General, healthful diet order yes: weight management and weight reduction    Nutritional Concerns/Comments: 53 y.o. female reports to initial nutrition visit with BMI of 30.57 kg/m2 and  has a past medical history of Anemia (2010), Anxiety (2014), Arthritis (2019), Bipolar 1 disorder (04/13/2012), Clotting disorder (HHS-HCC) (2005), Depression (2009), HIV disease (2009), Hyperlipidemia (03/09/2023), Hypertension (2014), Neuromuscular disorder (2022), and Obesity (2022). seeking nutritional counseling to mitigate elevated weight.     Since follow nutrition visit, pt reports a plateau in her weight loss. Pt has reduced her added sugar intake and is consuming more fruits and vegetables but now noticing an increase in processed carbohydrate intake as well as cravings for more sugary foods. This seems to be correlated with pt low protein and fiber intake in conjunction with low fluid intake.    Today, RD and pt discussed:  -lean protein sources  -hydration benefits  -portion control tips  -sources of whole-grains    DIET: If pt eats breakfast, she will have oatmeal, fruit and a piece of whole-wheat toast. For lunch, pt will have graham crackers, peanut butter and grapes. For dinner, pt will have baked chicken, buttered potatoes and salad.    PHYSICAL ACTIVITY: Pt walking more often and utilizing resistance band for arm and leg exercises (pt self-reports the use of band 8x in the past month).    FOOD INSECURITY: Pt screens positive. Pt receives $322 from [facility] program. Pt does not access a food pantry. RD has previously educated pt on Healthy Helpings Program through [employer] and [employer].    Anthropometric Data:    Height: 5'7  Wt: 195 lbs  BMI: 30.57 kg/m2    Weight History:  Wt Readings from Last 6 Encounters:   12/15/23 88.5 kg (195 lb 3.2 oz)   12/14/23 88.2 kg (194 lb 6.4 oz)   11/16/23 88 kg (194 lb)   10/26/23 88.5 kg (195 lb 3.2 oz)   10/13/23 88.5 kg (195 lb)   09/14/23 88.7 kg (195 lb 9.6 oz)       Physical Findings Data:     BP Today: N/A (telehealth visit)  BP History:  BP Readings from Last 6 Encounters:   12/15/23 138/97   11/16/23 125/83   09/14/23 158/90   08/25/23 127/91   08/01/23 135/85   07/20/23 135/85       Biochemical Data:  A1C:    Hemoglobin A1C   Date Value Ref Range Status   06/01/2023 5.2 4.8 - 5.6 % Final     Estimated Blood Glucose:   Lab Results   Component Value Date    Estimated Average Glucose 103 06/01/2023     Total Cholesterol:   Cholesterol, Total   Date Value Ref Range Status   03/30/2023 148 <=200 mg/dL Final     Trig:   Triglycerides   Date Value Ref Range Status   03/30/2023 81 0 - 150 mg/dL Final     HDL:   Cholesterol, HDL   Date Value Ref Range Status   03/30/2023 63 (H) 40 - 60 mg/dL Final  LDL:   Cholesterol, LDL, Calculated   Date Value Ref Range Status   03/30/2023 69 40 - 99 mg/dL Final     Comment:     NHLBI Recommended Ranges, LDL Cholesterol, for Adults (20+yrs) (ATPIII), mg/dL  Optimal              <899  Near Optimal        100-129  Borderline High     130-159  High                160-189  Very High            >=190  NHLBI Recommended Ranges, LDL Cholesterol, for Children (2-19 yrs), mg/dL  Desirable            <889  Borderline High     110-129  High                 >=130         Current Medications, Herbs, Supplements:    Current Outpatient Medications:     albuterol  HFA 90 mcg/actuation inhaler, Inhale 2 puffs every six (6) hours as needed., Disp: , Rfl:     amlodipine  (NORVASC ) 5 MG tablet, Take 1 tablet (5 mg total) by mouth daily., Disp: 30 tablet, Rfl: 11    atorvastatin  (LIPITOR) 20 MG tablet, TAKE 1 TABLET BY MOUTH EVERY DAY, Disp: 100 tablet, Rfl: 3    budesonide-formoterol (SYMBICORT ) 160-4.5 mcg/actuation inhaler, Inhale 2 puffs  in the morning and 2 puffs in the evening., Disp: 10.2 g, Rfl: 11    buPROPion  (WELLBUTRIN  XL) 300 MG 24 hr tablet, Take 1 tablet (300 mg total) by mouth daily., Disp: 30 tablet, Rfl: 1    cabotegravir -rilpivirine  (CABENUVA ) 600 mg-900 mg/3 mL extended-release injection, Inject 6 mL into the muscle every 8 weeks., Disp: 6 mL, Rfl: 6    celecoxib  (CELEBREX ) 200 MG capsule, Take 1 capsule (200 mg total) by mouth two (2) times a day as needed for pain., Disp: 60 capsule, Rfl: 2    FLUoxetine  (PROZAC ) 40 MG capsule, Take 2 capsules (80 mg total) by mouth daily., Disp: 60 capsule, Rfl: 1    gabapentin  (NEURONTIN ) 100 MG capsule, Take 1 capsule (100 mg total) by mouth nightly for 3 days, THEN 2 capsules (200 mg total) nightly for 3 days, THEN 3 capsules (300 mg total) nightly., Disp: 99 capsule, Rfl: 0    galcanezumab -gnlm (EMGALITY ) 120 mg/mL injection, Inject 2 pens (240 mg) under the skin every twenty-eight (28) days for 28 days, THEN 1 pen (120 mg) every twenty-eight (28) days., Disp: 4 mL, Rfl: 3    hydrOXYzine  (ATARAX ) 50 MG tablet, Take 1 tablet (50 mg total) by mouth two (2) times a day as needed for anxiety., Disp: 60 tablet, Rfl: 1    omeprazole  (PRILOSEC) 40 MG capsule, Take 1 capsule (40 mg total) by mouth two (2) times a day., Disp: 180 capsule, Rfl: 2    prazosin  (MINIPRESS ) 1 MG capsule, Take 1 capsule (1 mg total) by mouth nightly., Disp: 30 capsule, Rfl: 1    rimegepant (NURTEC ODT ) 75 mg TbDL, Take 1 tablet (75 mg total) by mouth once as needed at the onset of migraine. 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 For each statement, please tell me whether the statement was often true, sometimes true, or never true for your household in the 12 months.     1. ???We worried whether our food would run out before we got money to buy more.???   Sometimes True     2. ???The food that we bought just didn't last, and we didn't have money to get more.  Sometimes True       Usual Daily Food Choices:      24-Hour Recall/Usual Intake:  Time Intake   Breakfast Bowl of oatmeal with grapes and wheat toast   Snack (AM)    Lunch 1pm: graham crackers with peanut butter with grapes   Snack (PM)    Dinner Sauteed chicken with buttered potatoes, salad   Snack (HS)          Behavioral Factors:  Overeating: No issues noted.  Emotional Eating: No issues noted.  Grazing: Frequently grazes chips, grapes, crackers.   Fast Eating: No issues noted.  Nighttime Eating: No issues noted.    Factors Affecting Food Intake:  Knowledge and Beliefs: Has improved adherence to nutrition-related recommendations.   She has willingness to try new foods.  Stress/Anxiety: Patient with medical history of depression., Patient with medical history of anxiety.   Sleep Patterns: Patient with obstructive sleep apnea.  Food Safety and Access: She noted limited finances and resources.  She has SNAP/EBT benefits.   Other: n/a      Food Intolerances/Dietary Restrictions:  No known food allergies or food intolerances.     Other GI Issues:  No issues noted upon assessment    Hunger and Satiety:  Denied issues.       Allergies:  is allergic to ibuprofen, naproxen sodium, and naproxen.      Usual Daily Physical Activity Factors: >/=1.0 to <1.4 (Sedentary)  Method for estimating physical activity factor: 2020-2025 DRI Guidelines    Estimated Adherence:  Client self-reported adherence score 9  Method for estimating adherence: Adult Meducation Readiness Ruler      NUTRITION DIAGNOSIS:   Inadequate  intake of grains  Inadequate  intake of fruits  Inadequate  intake of vegetables  Inadequate  intake of milk/milk products  Inadequate  intake of meat, poultry, fish, eggs, beans, nut products    Method for estimating nutritional adequacy: 2020-2025 DRI Guidelines     Clinical Diagnosis:   Overweight/obesity related to undesirable food choices, sedentary lifestyle as evidenced by most recent BMI of 30.57 kg/m2 and self-reported past weight gain of ~100 lbs and no past exercise routine.            NUTRITION INTERVENTIONS/PLAN OF CARE    Nutrition Counseling   Nutrition Education     Coordination of care via Mail and Medical Record    Client agreed to follow interventions:  Yes       Nutrition-Related Medication Management: Nutrition-related complimentary/alternative medicine      Nutrition  Goals:  Prioritize a lean protein source at each meal with a total daily goal of >100 g.  Eggs  Chicken  Greek yogurt  Beans  Peanut butter  Low-fat milk  Turkey  Fish  Shrimp  Tuna and chicken salad made with lower-fat mayonnaise  Nuts-cashews, peanuts, pecans  Protein powder  2. Reduce consumption of processed carbohydrates from breads, crackers, graham crackers, buttered potatoes, pastas and replace with whole-wheat breads, pastas, beans, brown rice, rolled oats.  3.Increase water consumption to at least 4 bottles per day.  4. Can include 1/2 to 1 cup of shelled edamame and soy milk per day to increase protein, fiber intake.  5. Continue exercising with band and walking daily.  6. Portion control tips:  -consume protein and fruit/vegetable first during meals before carbohydrates  -always consume meals sitting at a table without screens or tablets or phones      Patient Education:  Individualized nutrition counseling and education provided on:   Lean Protein Sources  Fiber-Rich Foods  Water Intake    Materials Provided:  Patient instructions: Prominent nutrition related recommendations.   Handouts supporting dietary recommendations: n/a  Additional Materials: Written nutrition tips         NUTRITION MONITORING AND EVALUATION:  Follow-up appointment: telephone/video September 25th at 10:00am      Goals for next appointment:   1.Prioritize a lean protein source at each meal with a total daily goal of >100 g.  Eggs  Chicken  Greek yogurt  Beans  Peanut butter  Low-fat milk  Turkey  Fish  Shrimp  Tuna and chicken salad made with lower-fat mayonnaise  Nuts-cashews, peanuts, pecans  Protein powder  2. Reduce consumption of processed carbohydrates from breads, crackers, graham crackers, buttered potatoes, pastas and replace with whole-wheat breads, pastas, beans, brown rice, rolled oats.  3.Increase water consumption to at least 4 bottles per day.  4. Can include 1/2 to 1 cup of shelled edamame and soy milk per day to increase protein, fiber intake.  5. Continue exercising with band and walking daily.  6. Portion control tips:  -consume protein and fruit/vegetable first during meals before carbohydrates  -always consume meals sitting at a table without screens or tablets or phones    Length of visit was: 19.04 minutes    The patient reports they are physically located in [place]  and is currently: at home. I conducted a phone visit.  I spent 19.04 minutes on the phone call with the patient on the date of service .        Signed:  Karilyn DELENA Areola  12/22/2023 10:05 AM

## 2023-12-30 DIAGNOSIS — F411 Generalized anxiety disorder: Principal | ICD-10-CM

## 2023-12-30 DIAGNOSIS — F4310 Post-traumatic stress disorder, unspecified: Principal | ICD-10-CM

## 2023-12-30 DIAGNOSIS — F329 Major depressive disorder, single episode, unspecified: Principal | ICD-10-CM

## 2024-01-04 MED ORDER — BUPROPION HCL XL 300 MG 24 HR TABLET, EXTENDED RELEASE
ORAL_TABLET | Freq: Every day | ORAL | 11 refills | 30.00000 days
Start: 2024-01-04 — End: ?

## 2024-01-08 DIAGNOSIS — Z21 Asymptomatic human immunodeficiency virus [HIV] infection status: Principal | ICD-10-CM

## 2024-01-11 DIAGNOSIS — Z21 Asymptomatic human immunodeficiency virus [HIV] infection status: Principal | ICD-10-CM

## 2024-01-11 MED ADMIN — cabotegravir-rilpivirine (CABENUVA) 600 mg-900 mg/3 mL extended-release injection 6 mL: 6 mL | INTRAMUSCULAR | @ 17:00:00 | Stop: 2024-01-11
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
## 2024-01-11 NOTE — Unmapped (Signed)
 Duration of Intervention: 60 minutes   Type of Contact: in-person   Reason for Contact: Patient in clinic for scheduled Cabenuva  injection for ongoing HIV-1 maintenance therapy.   Intervention: Patient presented for routine Cabenuva  injection. Discussed any changes since the last visit:   Mood:  No changes  Sleep: No Changes   Cabenuva  administered per protocol:   Medication: Cabotegravir  and Rilpivirine  Bi-Monthly   Needle size:23GA 1 1/2  Patient tolerated the injection well   Observed post-injection; no immediate adverse effects noted   Plan:   Continue Cabenuva  per established dosing interval   Next injection scheduled for: 03/07/2024.   Patient will not meet with the provider at the next visit   Monitor for side effects and assess mood/sleep changes at follow-ups   Reinforce adherence to ??7-day dosing window      Electronically signed by:   Isaiah KANDICE Sharps, RN

## 2024-01-12 DIAGNOSIS — F4310 Post-traumatic stress disorder, unspecified: Principal | ICD-10-CM

## 2024-01-12 DIAGNOSIS — F411 Generalized anxiety disorder: Principal | ICD-10-CM

## 2024-01-12 DIAGNOSIS — F329 Major depressive disorder, single episode, unspecified: Principal | ICD-10-CM

## 2024-01-12 MED ORDER — HYDROXYZINE HCL 50 MG TABLET
ORAL_TABLET | Freq: Two times a day (BID) | ORAL | 1 refills | 30.00000 days | Status: CP | PRN
Start: 2024-01-12 — End: 2024-03-12

## 2024-01-12 MED ORDER — PRAZOSIN 1 MG CAPSULE
ORAL_CAPSULE | Freq: Every evening | ORAL | 1 refills | 30.00000 days | Status: CP
Start: 2024-01-12 — End: 2024-03-12

## 2024-01-12 MED ORDER — TRAZODONE 50 MG TABLET
ORAL_TABLET | Freq: Every evening | ORAL | 1 refills | 30.00000 days | Status: CP
Start: 2024-01-12 — End: 2024-03-12

## 2024-01-12 MED ORDER — FLUOXETINE 40 MG CAPSULE
ORAL_CAPSULE | Freq: Every day | ORAL | 1 refills | 30.00000 days | Status: CP
Start: 2024-01-12 — End: ?

## 2024-01-12 MED ORDER — BUPROPION HCL XL 300 MG 24 HR TABLET, EXTENDED RELEASE
ORAL_TABLET | Freq: Every day | ORAL | 1 refills | 30.00000 days | Status: CP
Start: 2024-01-12 — End: 2024-03-12

## 2024-01-12 NOTE — Unmapped (Signed)
 Follow-up instructions:  -- INCREASE to prazosin  2 mg nightly.  - Please continue taking your medications as prescribed for your mental health.   - Do not make changes to your medications, including taking more or less than prescribed, unless under the supervision of your physician. Be aware that some medications may make you feel worse if abruptly stopped  - Please refrain from using illicit substances, as these can affect your mood and could cause anxiety or other concerning symptoms.   - Seek further medical care for any increase in symptoms or new symptoms such as thoughts of wanting to hurt yourself or hurt others.     Contact info:  Life-threatening emergencies: Call 911, the 988 suicide and crisis lifeline, or go to the nearest ER for medical or psychiatric attention.           Issues that need urgent attention but are not life threatening: Call the clinic outpatient front desk for assistance:    - [phone] for [facility] adult [facility] clinics located at [address] [facility]  - [phone] for [facility] child and adolescent psychiatry clinics located at [address]  - [phone] for [facility] and adult psychiatry clinics located at [address] [facility]    Non-urgent routine concerns and questions: Send a message through [facility] or call our clinic front desk.    Refill requests: Check with your pharmacy to initiate refill requests.    Regarding appointments:  - If you need to cancel your appointment, we ask that you call your clinic at least 24 hours before your scheduled appointment at the numbers listed above.  - If for any reason you arrive 15 minutes later than your scheduled appointment time, you may not be seen and your visit may be rescheduled.  - Please remember that we will not automatically reschedule missed appointments.  - If you miss two (2) appointments without letting us  know in advance, you will likely be referred to a provider in your community.  - We will do our best to be on time. Sometimes an emergency will arise that might cause your clinician to be late. We will try to inform you of this when you check in for your appointment. If you wait more than 15 minutes past your appointment time without such notice, please speak with the front desk staff.    In the event of bad weather, the clinic staff will attempt to contact you, should your appointment need to be rescheduled. Additionally, you can call the Patient Weather Line [phone] for system-wide clinic status    For more information and reminders regarding clinic policies (these were provided when you were admitted to the clinic), please ask the front desk.

## 2024-01-14 ENCOUNTER — Encounter: Payer: Self-pay | Admitting: *Deleted

## 2024-01-14 ENCOUNTER — Other Ambulatory Visit: Payer: Self-pay

## 2024-01-14 DIAGNOSIS — N390 Urinary tract infection, site not specified: Secondary | ICD-10-CM | POA: Insufficient documentation

## 2024-01-14 DIAGNOSIS — R1084 Generalized abdominal pain: Secondary | ICD-10-CM | POA: Diagnosis present

## 2024-01-14 LAB — URINALYSIS, ROUTINE W REFLEX MICROSCOPIC
Bilirubin Urine: NEGATIVE
Glucose, UA: NEGATIVE mg/dL
Ketones, ur: NEGATIVE mg/dL
Nitrite: NEGATIVE
Protein, ur: 30 mg/dL — AB
Specific Gravity, Urine: 1.016 (ref 1.005–1.030)
WBC, UA: >50 WBC/hpf (ref 0–5)
pH: 6 (ref 5.0–8.0)

## 2024-01-14 LAB — COMPREHENSIVE METABOLIC PANEL WITH GFR
ALT: 18 U/L (ref 0–44)
AST: 18 U/L (ref 15–41)
Albumin: 4 g/dL (ref 3.5–5.0)
Alkaline Phosphatase: 85 U/L (ref 38–126)
Anion gap: 12 (ref 5–15)
BUN: 13 mg/dL (ref 6–20)
CO2: 24 mmol/L (ref 22–32)
Calcium: 9.1 mg/dL (ref 8.9–10.3)
Chloride: 102 mmol/L (ref 98–111)
Creatinine, Ser: 0.97 mg/dL (ref 0.44–1.00)
GFR, Estimated: >60 mL/min (ref >60)
Glucose, Bld: 102 mg/dL — ABNORMAL HIGH (ref 70–99)
Potassium: 3.4 mmol/L — ABNORMAL LOW (ref 3.5–5.1)
Sodium: 138 mmol/L (ref 135–145)
Total Bilirubin: 0.5 mg/dL (ref 0.0–1.2)
Total Protein: 7.7 g/dL (ref 6.5–8.1)

## 2024-01-14 LAB — CBC
HCT: 39.1 % (ref 36.0–46.0)
Hemoglobin: 12.9 g/dL (ref 12.0–15.0)
MCH: 26.7 pg (ref 26.0–34.0)
MCHC: 33 g/dL (ref 30.0–36.0)
MCV: 81 fL (ref 80.0–100.0)
Platelets: 210 K/uL (ref 150–400)
RBC: 4.83 MIL/uL (ref 3.87–5.11)
RDW: 13.3 % (ref 11.5–15.5)
WBC: 9.5 K/uL (ref 4.0–10.5)
nRBC: 0 % (ref 0.0–0.2)

## 2024-01-14 LAB — LIPASE, BLOOD: Lipase: 42 U/L (ref 11–51)

## 2024-01-14 NOTE — ED Triage Notes (Signed)
 Pt reports yesterday she had stomach ache, cold chills and nausea. Endorses midline abdominal ache that has become more intense. Vomited x 2 today, no diarrhea. LBM this morning, reports as normal.

## 2024-01-15 ENCOUNTER — Emergency Department
Admission: EM | Admit: 2024-01-15 | Discharge: 2024-01-15 | Disposition: A | Discharge status: Home / Self Care | Attending: Emergency Medicine | Admitting: Emergency Medicine

## 2024-01-15 ENCOUNTER — Emergency Department

## 2024-01-15 DIAGNOSIS — R1084 Generalized abdominal pain: Secondary | ICD-10-CM

## 2024-01-15 DIAGNOSIS — N390 Urinary tract infection, site not specified: Secondary | ICD-10-CM

## 2024-01-15 MED ORDER — CEFADROXIL 500 MG PO CAPS: 500.0000 mg | ORAL_CAPSULE | Freq: Two times a day (BID) | ORAL | 0 refills | Status: AC

## 2024-01-15 MED ORDER — ONDANSETRON HCL 4 MG/2ML IJ SOLN
4.0000 mg | INTRAMUSCULAR | Status: AC
  Administered 2024-01-15: 4 mg via INTRAVENOUS
  Filled 2024-01-15: qty 2

## 2024-01-15 MED ORDER — IOHEXOL 300 MG/ML  SOLN
100.0000 mL | Freq: Once | INTRAMUSCULAR | Status: AC | PRN
  Administered 2024-01-15: 100 mL via INTRAVENOUS

## 2024-01-15 MED ORDER — SODIUM CHLORIDE 0.9 % IV SOLN
1.0000 g | INTRAVENOUS | Status: AC
  Administered 2024-01-15: 1 g via INTRAVENOUS
  Filled 2024-01-15: qty 10

## 2024-01-15 MED ORDER — MORPHINE SULFATE (PF) 4 MG/ML IV SOLN
4.0000 mg | Freq: Once | INTRAVENOUS | Status: AC
  Administered 2024-01-15: 4 mg via INTRAVENOUS
  Filled 2024-01-15: qty 1

## 2024-01-15 MED ORDER — HYDROCODONE-ACETAMINOPHEN 5-325 MG PO TABS: 2.0000 | ORAL_TABLET | Freq: Four times a day (QID) | ORAL | 0 refills | Status: AC | PRN

## 2024-01-15 MED ORDER — DOCUSATE SODIUM 100 MG PO CAPS: ORAL_CAPSULE | ORAL | 0 refills | Status: AC

## 2024-01-15 MED ORDER — ONDANSETRON 4 MG PO TBDP: ORAL_TABLET | ORAL | 0 refills | Status: AC

## 2024-01-15 NOTE — Discharge Instructions (Addendum)
 As we discussed, your evaluation was reassuring and we believe that your pain and other symptoms are likely due to your urinary tract infection.  Please take the full course of antibiotics as prescribed.  Use the prescribed nausea medicine as needed according label instructions.  We recommend you take over-the-counter ibuprofen  and Tylenol  according to the label instructions as needed for pain. Take Norco as prescribed for severe pain. Do not drink alcohol, drive or participate in any other potentially dangerous activities while taking this medication as it may make you sleepy. Do not take this medication with any other sedating medications, either prescription or over-the-counter. If you were prescribed Percocet or Vicodin, do not take these with acetaminophen  (Tylenol ) as it is already contained within these medications.   This medication is an opiate (or narcotic) pain medication and can be habit forming.  Use it as little as possible to achieve adequate pain control.  Do not use or use it with extreme caution if you have a history of opiate abuse or dependence.  If you are on a pain contract with your primary care doctor or a pain specialist, be sure to let them know you were prescribed this medication today from the [facility].  This medication is intended for your use only - do not give any to anyone else and keep it in a secure place where nobody else, especially children, have access to it.  It will also cause or worsen constipation, so you may want to consider taking an over-the-counter stool softener while you are taking this medication.

## 2024-01-15 NOTE — ED Provider Notes (Signed)
 [facility] Provider Note    Event Date/Time   First MD Initiated Contact with Patient 01/15/24 0014     (approximate)   History   Abdominal Pain   HPI Teresa [name] is a 53 y.o. female who presents with about 2 days of generalized abdominal pain that has been persistent.  She said that it started relatively mild at about 4 PM and has been constant for more than 24 hours.  It is accompanied with nausea and then she had a couple episodes of vomiting today as well.  She has not felt anything like this before.  She has had multiple surgeries in the past for abdominal hernia, the last 1 being about 3 years ago.  She is passing gas and having a normal bowel movement today.  She has not had a fever, chest pain, nor shortness of breath.  No dysuria.     Physical Exam   Triage Vital Signs: ED Triage Vitals  Encounter Vitals Group     BP 01/14/24 2235 (!) 163/97     Girls Systolic BP Percentile --      Girls Diastolic BP Percentile --      Boys Systolic BP Percentile --      Boys Diastolic BP Percentile --      Pulse Rate 01/14/24 2235 79     Resp 01/14/24 2235 18     Temp 01/14/24 2235 98.6 F (37 C)     Temp src --      SpO2 01/14/24 2235 97 %     Weight 01/14/24 2233 88.5 kg (195 lb)     Height 01/14/24 2233 1.702 m (5' 7)     Head Circumference --      Peak Flow --      Pain Score 01/14/24 2233 10     Pain Loc --      Pain Education --      Exclude from Growth Chart --     Most recent vital signs: Vitals:   01/14/24 2235 01/15/24 0200  BP: (!) 163/97 (!) 142/84  Pulse: 79 74  Resp: 18 18  Temp: 98.6 F (37 C)   SpO2: 97% 100%    General: Awake, appears uncomfortable but nontoxic. CV:  Good peripheral perfusion.   Resp:  Normal effort. Speaking easily and comfortably, no accessory muscle usage nor intercostal retractions.   Abd:  Obese.  Abdomen is soft with tenderness to palpation in the epigastrium and right upper quadrant with some  guarding.  Patient also has some periumbilical tenderness but it seems to be more that pressing around the umbilicus causes referred pain in the upper abdomen.  No specific suprapubic tenderness.  No palpable masses consistent with hernia.   ED Results / Procedures / Treatments   Labs (all labs ordered are listed, but only abnormal results are displayed) Labs Reviewed  COMPREHENSIVE METABOLIC PANEL WITH GFR - Abnormal; Notable for the following components:      Result Value   Potassium 3.4 (*)    Glucose, Bld 102 (*)    All other components within normal limits  URINALYSIS, ROUTINE W REFLEX MICROSCOPIC - Abnormal; Notable for the following components:   Color, Urine YELLOW (*)    APPearance HAZY (*)    Hgb urine dipstick MODERATE (*)    Protein, ur 30 (*)    Leukocytes,Ua MODERATE (*)    Bacteria, UA MANY (*)    All other components within normal limits  URINE  CULTURE  LIPASE, BLOOD  CBC      RADIOLOGY See ED course for details   PROCEDURES:  Critical Care performed: No  Procedures    IMPRESSION / MDM / ASSESSMENT AND PLAN / ED COURSE  I reviewed the triage vital signs and the nursing notes.                              Differential diagnosis includes, but is not limited to, biliary colic or other gallbladder disease, gastritis/GERD, SBO/ileus, intra-abdominal infection such as diverticulitis or appendicitis.  Patient's presentation is most consistent with acute presentation with potential threat to life or bodily function.  Labs/studies ordered: CBC, CMP, urinalysis, lipase, urine culture, right upper quadrant ultrasound.  See ED course for additional details  Interventions/Medications given:  Medications  morphine  (PF) 4 MG/ML injection 4 mg (4 mg Intravenous Given 01/15/24 0107)  ondansetron  (ZOFRAN ) injection 4 mg (4 mg Intravenous Given 01/15/24 0107)  cefTRIAXone  (ROCEPHIN ) 1 g in sodium chloride  0.9 % 100 mL IVPB (0 g Intravenous Stopped 01/15/24 0226)   morphine  (PF) 4 MG/ML injection 4 mg (4 mg Intravenous Given 01/15/24 0157)  iohexol  (OMNIPAQUE ) 300 MG/ML solution 100 mL (100 mLs Intravenous Contrast Given 01/15/24 0217)    (Note:  hospital course my include additional interventions and/or labs/studies not listed above.)   Vital signs normal other than some hypertension.  Patient has abdominal discomfort which I think most likely represents biliary colic.  Labs are all essentially normal other than the patient is positive for UTI.  I sent a urine culture and anticipate treating empirically although the patient has no urinary symptoms.  I am ordering a gram of ceftriaxone  IV while she is getting rest for her evaluation.  Morphine  4 mg IV and Zofran  4 mg IV have been ordered for her symptoms.  If ultrasound is unremarkable, anticipate proceeding with CT based on the degree of abdominal discomfort and her past surgical history.     Clinical Course as of 01/15/24 9667  [place] Jan 15, 2024  0206 US  ABDOMEN LIMITED RUQ (LIVER/GB) I independently viewed and interpreted the patient's ultrasound and I see no evidence of gallbladder disease including no evidence of cholelithiasis.  Given the ongoing pain and the patient is experiencing I have ordered a CT of the abdomen and pelvis with IV contrast.  She required another dose of morphine  4 mg IV [CF]  0245 CT ABDOMEN PELVIS W CONTRAST I independently viewed and interpreted the patient's CT of the abdomen and pelvis I see no evidence of SBO or ileus.  Radiology commented on no acute findings in the abdomen. [CF]  0327 I reassessed the patient and she is feeling better.  I updated her about all the findings and how likely she is feeling ill because of the UTI.  She is comfortable with the plan for outpatient treatment and follow-up with her regular doctor at [facility]  I gave my usual and customary return precautions.  The patient's medical screening exam is reassuring with no indication of an emergent medical  condition requiring hospitalization or additional evaluation at this point.  The patient is safe and appropriate for discharge and outpatient follow up. [CF]    Clinical Course User Index [CF] Gordan Huxley, MD     FINAL CLINICAL IMPRESSION(S) / ED DIAGNOSES   Final diagnoses:  Urinary tract infection without hematuria, site unspecified  Generalized abdominal pain     Rx / DC Orders  ED Discharge Orders          Ordered    cefadroxil  (DURICEF) 500 MG capsule  2 times daily        01/15/24 0330    ondansetron  (ZOFRAN -ODT) 4 MG disintegrating tablet        01/15/24 0330    HYDROcodone -acetaminophen  (NORCO/VICODIN) 5-325 MG tablet  Every 6 hours PRN        01/15/24 0330    docusate sodium  (COLACE) 100 MG capsule        01/15/24 0330             Note:  This document was prepared using Dragon voice recognition software and may include unintentional dictation errors.   Gordan Huxley, MD 01/15/24 [phone]

## 2024-01-17 LAB — URINE CULTURE: Culture: >=100000 — AB

## 2024-01-19 DIAGNOSIS — E66811 Obesity, Class I, BMI 30-34.9: Principal | ICD-10-CM

## 2024-01-19 NOTE — Unmapped (Signed)
 Goals for next appointment:   1.Prioritize a lean protein source at each meal with a total daily goal of >100 g.  Eggs  Chicken  Greek yogurt  Beans  Peanut butter  Low-fat milk  Turkey  Fish  Shrimp  Tuna and chicken salad made with lower-fat mayonnaise  Nuts-cashews, peanuts, pecans  Protein powder  2.Increase water consumption to at least 5 to 6 bottles per day.  3. Start bowel regimen:  -smooth move tea at night  -psyllium husk with water in AM (1-3 tsp)  4. Continue exercising with resistance band and increase walking mileage to 1 mile 3-4x per week.  5. Portion control tips:  -consume protein and fruit/vegetable first during meals before carbohydrates  -always consume meals sitting at a table without screens or tablets or phones

## 2024-01-19 NOTE — Unmapped (Signed)
 [facility]    Adult Medical Nutrition Therapy - Initial Assessment/Care Plan      Date: 01/19/2024    Patient Name: Kelly Cervantes    Date of Birth / Age / Birth Gender: [birth year] / 53 y.o. / Female    Referring MD or Clinic: Self, Referred    Interpreter: No    Reason for Referral: General, healthful diet order yes: weight management and weight reduction    Nutritional Concerns/Comments: 53 y.o. female reports to follow-up nutrition visit with BMI of 30.60 kg/m2 and  has a past medical history of Anemia (2010), Anxiety (2014), Arthritis (2019), Bipolar 1 disorder    (CMS-HCC) (04/13/2012), Clotting disorder (HHS-HCC) (2005), Depression (2009), HIV disease    (CMS-HCC) (2009), Hyperlipidemia (03/09/2023), Hypertension (2014), Neuromuscular disorder    (CMS-HCC) (2022), and Obesity (2022). seeking nutritional counseling to mitigate elevated weight.     Since follow nutrition visit, pt reports adherence to nutrition-related recommendations but notes recent constipation that she associates with taking hydrocodone and cefadroxil due to recent UTI.    Today, RD and pt discussed:  -bowel regimen while on medications  -portion control tips  -protein needs, recommendations  -fiber intake  -water intake    DIET: If pt eats breakfast, she will have greek yogurt with granola, grapes, boiled eggs. For lunch, pt will have tuna sandwich with lettuce, egg, mayonnaise on multi-grain bread. For dinner, pt will have pork chops with green beans.    PHYSICAL ACTIVITY: Pt walking more often and utilizing resistance band for arm and leg exercises (pt self-reports the use of band 5x in the past month)    FOOD INSECURITY: Pt screens positive. Pt receives $322 from [facility] program. Pt does not access a food pantry. RD looked up nearest farmer's market for pt to access and use double bucks to purchase fresh fruits, vegetables.    Anthropometric Data:    Height: 5'7  Wt: 195 lbs  BMI: 30.60 kg/m2    Weight History:  Wt Readings from Last 6 Encounters:   01/11/24 88.6 kg (195 lb 6.4 oz)   12/15/23 88.5 kg (195 lb 3.2 oz)   12/14/23 88.2 kg (194 lb 6.4 oz)   11/16/23 88 kg (194 lb)   10/26/23 88.5 kg (195 lb 3.2 oz)   10/13/23 88.5 kg (195 lb)       Physical Findings Data:     BP Today: N/A (telehealth visit)  BP History:  BP Readings from Last 6 Encounters:   01/11/24 134/85   12/15/23 138/97   11/16/23 125/83   09/14/23 158/90   08/25/23 127/91   08/01/23 135/85       Biochemical Data:  A1C:    Hemoglobin A1C   Date Value Ref Range Status   06/01/2023 5.2 4.8 - 5.6 % Final     Estimated Blood Glucose:   Lab Results   Component Value Date    Estimated Average Glucose 103 06/01/2023     Total Cholesterol:   Cholesterol, Total   Date Value Ref Range Status   03/30/2023 148 <=200 mg/dL Final     Trig:   Triglycerides   Date Value Ref Range Status   03/30/2023 81 0 - 150 mg/dL Final     HDL:   Cholesterol, HDL   Date Value Ref Range Status   03/30/2023 63 (H) 40 - 60 mg/dL Final      LDL:   Cholesterol, LDL, Calculated   Date Value Ref Range Status  03/30/2023 69 40 - 99 mg/dL Final     Comment:     NHLBI Recommended Ranges, LDL Cholesterol, for Adults (20+yrs) (ATPIII), mg/dL  Optimal              <899  Near Optimal        100-129  Borderline High     130-159  High                160-189  Very High            >=190  NHLBI Recommended Ranges, LDL Cholesterol, for Children (2-19 yrs), mg/dL  Desirable            <889  Borderline High     110-129  High                 >=130         Current Medications, Herbs, Supplements:    Current Outpatient Medications:     albuterol  HFA 90 mcg/actuation inhaler, Inhale 2 puffs every six (6) hours as needed., Disp: , Rfl:     amlodipine  (NORVASC ) 5 MG tablet, Take 1 tablet (5 mg total) by mouth daily., Disp: 30 tablet, Rfl: 11    atorvastatin  (LIPITOR) 20 MG tablet, TAKE 1 TABLET BY MOUTH EVERY DAY, Disp: 100 tablet, Rfl: 3    budesonide-formoterol (SYMBICORT ) 160-4.5 mcg/actuation inhaler, Inhale 2 puffs  in the morning and 2 puffs in the evening., Disp: 10.2 g, Rfl: 11    buPROPion  (WELLBUTRIN  XL) 300 MG 24 hr tablet, Take 1 tablet (300 mg total) by mouth daily., Disp: 30 tablet, Rfl: 1    cabotegravir -rilpivirine  (CABENUVA ) 600 mg-900 mg/3 mL extended-release injection, Inject 6 mL into the muscle every 8 weeks., Disp: 6 mL, Rfl: 6    celecoxib  (CELEBREX ) 200 MG capsule, Take 1 capsule (200 mg total) by mouth two (2) times a day as needed for pain., Disp: 60 capsule, Rfl: 2    FLUoxetine  (PROZAC ) 40 MG capsule, Take 2 capsules (80 mg total) by mouth daily., Disp: 60 capsule, Rfl: 1    gabapentin  (NEURONTIN ) 100 MG capsule, Take 1 capsule (100 mg total) by mouth nightly for 3 days, THEN 2 capsules (200 mg total) nightly for 3 days, THEN 3 capsules (300 mg total) nightly., Disp: 99 capsule, Rfl: 0    galcanezumab -gnlm (EMGALITY ) 120 mg/mL injection, Inject 2 pens (240 mg) under the skin every twenty-eight (28) days for 28 days, THEN 1 pen (120 mg) every twenty-eight (28) days., Disp: 4 mL, Rfl: 3    hydrOXYzine  (ATARAX ) 50 MG tablet, Take 1 tablet (50 mg total) by mouth two (2) times a day as needed for anxiety., Disp: 60 tablet, Rfl: 1    omeprazole  (PRILOSEC) 40 MG capsule, Take 1 capsule (40 mg total) by mouth two (2) times a day., Disp: 180 capsule, Rfl: 2    prazosin  (MINIPRESS ) 1 MG capsule, Take 2 capsules (2 mg total) by mouth nightly., Disp: 60 capsule, Rfl: 1    rimegepant (NURTEC ODT ) 75 mg TbDL, Take 1 tablet (75 mg total) by mouth once as needed at the onset of migraine. 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 For each statement, please tell me whether the statement was often true, sometimes true, or never true for your household in the 12 months.     1. ???We worried whether our food would run out before we got money to buy more.???   Sometimes True     2. ???The food that we bought just didn't last, and we didn't have money to get more.  Sometimes True       Usual Daily Food Choices:      24-Hour Recall/Usual Intake:  Time Intake   Breakfast Boiled eggs, grapes, greek yogurt with granola   Snack (AM)    Lunch Multi-grain bread, tuna, lettuce, egg, mayonnaise    Snack (PM)    Dinner Grilled pork chops, green beans   Snack (HS)          Behavioral Factors:  Overeating: No issues noted.  Emotional Eating: No issues noted.  Grazing: Frequently grazes chips, grapes, crackers.   Fast Eating: No issues noted.  Nighttime Eating: No issues noted.    Factors Affecting Food Intake:  Knowledge and Beliefs: Has improved adherence to nutrition-related recommendations.   She has willingness to try new foods.  Stress/Anxiety: Patient with medical history of depression., Patient with medical history of anxiety.   Sleep Patterns: Patient with obstructive sleep apnea.  Food Safety and Access: She noted limited finances and resources.  She has SNAP/EBT benefits.   Other: n/a      Food Intolerances/Dietary Restrictions:  No known food allergies or food intolerances.     Other GI Issues:  Constipation    Hunger and Satiety:  Denied issues.       Allergies:  is allergic to ibuprofen, naproxen sodium, and naproxen.      Usual Daily Physical Activity Factors: >/=1.0 to <1.4 (Sedentary)  Method for estimating physical activity factor: 2020-2025 DRI Guidelines    Estimated Adherence:  Client self-reported adherence score: 7  Method for estimating adherence: Adult Meducation Readiness Ruler      NUTRITION DIAGNOSIS:   Inadequate  intake of grains  Inadequate  intake of fruits  Inadequate  intake of vegetables  Inadequate  intake of milk/milk products    Method for estimating nutritional adequacy: 2020-2025 DRI Guidelines     Clinical Diagnosis:   Overweight/obesity related to previous undesirable food choices, sedentary lifestyle as evidenced by most recent BMI of 30.60 kg/m2 and self-reported past weight gain of ~100 lbs and no past exercise routine.            NUTRITION INTERVENTIONS/PLAN OF CARE    Nutrition Counseling   Nutrition Education     Coordination of care via Mail and Medical Record    Client agreed to follow interventions:  Yes       Nutrition-Related Medication Management: Nutrition-related complimentary/alternative medicine      Nutrition Goals:  1.Prioritize a lean protein source at each meal with a total daily goal of >100 g.  Eggs  Chicken  Greek yogurt  Beans  Peanut  butter  Low-fat milk  Tur[place]  Fish  Shrimp  Tuna and chicken salad made with lower-fat mayonnaise  Nuts-cashews, peanuts, pecans  Protein powder  2.Increase water consumption to at least 5 to 6 bottles per day.  3. Start bowel regimen:  -smooth move tea at night  -psyllium husk with water in AM (1-3 tsp)  4. Continue exercising with resistance band and increase walking mileage to 1 mile 3-4x per week.  5. Portion control tips:  -consume protein and fruit/vegetable first during meals before carbohydrates  -always consume meals sitting at a table without screens or tablets or phones    Patient Education:  Individualized nutrition counseling and education provided on:   Lean Protein Sources  Fiber-Rich Foods  Water Intake  Portion control tips    Materials Provided:  Patient instructions: Prominent nutrition related recommendations.   Handouts supporting dietary recommendations: n/a  Additional Materials: Written nutrition tips NUTRITION MONITORING AND EVALUATION:  Follow-up appointment: in clinic November 12th at 1200pm      Goals for next appointment:   1.Prioritize a lean protein source at each meal with a total daily goal of >100 g.  Eggs  Chicken  Greek yogurt  Beans  Peanut butter  Low-fat milk  Tur[place]  Fish  Shrimp  Tuna and chicken salad made with lower-fat mayonnaise  Nuts-cashews, peanuts, pecans  Protein powder  2.Increase water consumption to at least 5 to 6 bottles per day.  3. Start bowel regimen:  -smooth move tea at night  -psyllium husk with water in AM (1-3 tsp)  4. Continue exercising with resistance band and increase walking mileage to 1 mile 3-4x per week.  5. Portion control tips:  -consume protein and fruit/vegetable first during meals before carbohydrates  -always consume meals sitting at a table without screens or tablets or phones    Length of visit was: 23.36 minutes    The patient reports they are physically located in [place]  and is currently: at home. I conducted a phone visit.  I spent 23.36 minutes on the phone call with the patient on the date of service .        Signed:  Karilyn DELENA Areola  01/19/2024 10:02 AM

## 2024-01-23 NOTE — Unmapped (Signed)
This was a telepsychiatry encounter. I was immediately available on site, and I reviewed and discussed the case with the resident, but did not see the patient.  I agree with the assessment and plan as documented in the resident's note.     Xachary Hambly S Wynn Alldredge, MD

## 2024-01-25 DIAGNOSIS — M25531 Pain in right wrist: Principal | ICD-10-CM

## 2024-01-25 DIAGNOSIS — M25562 Pain in left knee: Principal | ICD-10-CM

## 2024-01-25 DIAGNOSIS — M25532 Pain in left wrist: Principal | ICD-10-CM

## 2024-01-25 DIAGNOSIS — M1711 Unilateral primary osteoarthritis, right knee: Principal | ICD-10-CM

## 2024-01-25 MED ORDER — CELECOXIB 200 MG CAPSULE
ORAL_CAPSULE | Freq: Two times a day (BID) | ORAL | 2 refills | 30.00000 days | Status: CP | PRN
Start: 2024-01-25 — End: ?

## 2024-02-04 MED ORDER — TRAZODONE 50 MG TABLET
ORAL_TABLET | Freq: Every evening | ORAL | 11 refills | 0.00000 days
Start: 2024-02-04 — End: ?

## 2024-02-05 DIAGNOSIS — Z21 Asymptomatic human immunodeficiency virus [HIV] infection status: Principal | ICD-10-CM

## 2024-02-06 MED ORDER — TRAZODONE 50 MG TABLET
ORAL_TABLET | Freq: Every evening | ORAL | 11 refills | 30.00000 days | Status: CP
Start: 2024-02-06 — End: ?

## 2024-02-22 MED ORDER — PRAZOSIN 1 MG CAPSULE
ORAL_CAPSULE | 5 refills | 0.00000 days
Start: 2024-02-22 — End: ?

## 2024-02-22 MED ORDER — BUPROPION HCL XL 300 MG 24 HR TABLET, EXTENDED RELEASE
ORAL_TABLET | Freq: Every day | ORAL | 11 refills | 0.00000 days
Start: 2024-02-22 — End: ?

## 2024-02-23 MED ORDER — PRAZOSIN 1 MG CAPSULE
ORAL_CAPSULE | ORAL | 0 refills | 0.00000 days | Status: CP
Start: 2024-02-23 — End: ?

## 2024-02-23 MED ORDER — BUPROPION HCL XL 300 MG 24 HR TABLET, EXTENDED RELEASE
ORAL_TABLET | Freq: Every day | ORAL | 0 refills | 30.00000 days | Status: CP
Start: 2024-02-23 — End: ?

## 2024-02-26 DIAGNOSIS — F411 Generalized anxiety disorder: Principal | ICD-10-CM

## 2024-02-26 MED ORDER — FLUOXETINE 40 MG CAPSULE
ORAL_CAPSULE | Freq: Every day | ORAL | 11 refills | 0.00000 days
Start: 2024-02-26 — End: ?

## 2024-02-27 MED ORDER — FLUOXETINE 40 MG CAPSULE
ORAL_CAPSULE | Freq: Every day | ORAL | 0 refills | 30.00000 days | Status: CP
Start: 2024-02-27 — End: ?

## 2024-02-28 NOTE — Telephone Encounter (Signed)
 Called [facility] to order cabenuva  to clinic. Order was placed for 02/29/2024.

## 2024-03-05 MED FILL — EMGALITY PEN 120 MG/ML SUBCUTANEOUS PEN INJECTOR: SUBCUTANEOUS | 84 days supply | Qty: 3 | Fill #2

## 2024-03-07 DIAGNOSIS — E66811 Obesity, Class I, BMI 30-34.9: Principal | ICD-10-CM

## 2024-03-07 DIAGNOSIS — Z21 Asymptomatic human immunodeficiency virus [HIV] infection status: Principal | ICD-10-CM

## 2024-03-07 MED ADMIN — cabotegravir-rilpivirine (CABENUVA) 600 mg-900 mg/3 mL extended-release injection 6 mL: 6 mL | INTRAMUSCULAR | @ 16:00:00 | Stop: 2024-03-07

## 2024-03-07 NOTE — Progress Notes (Signed)
 Duration of Intervention: 60 minutes   Type of Contact: in-person   Reason for Contact: Patient in clinic for scheduled Cabenuva  injection for ongoing HIV-1 maintenance therapy.   Intervention: Patient presented for routine Cabenuva  injection. Discussed any changes since the last visit:   Mood:  No changes  Sleep: No Changes   Cabenuva  administered per protocol:   Medication: Cabotegravir  and Rilpivirine  Bi-Monthly   Needle size: 23G 1 1/2  Patient tolerated the injection well   Observed post-injection; no immediate adverse effects noted   Care Plan:   Continue Cabenuva  per established dosing interval   Next injection scheduled for: 1/7   Patient will not meet with the provider at the next visit   Monitor for side effects and assess mood/sleep changes at follow-ups   Reinforce treatment adherence to ??7-day dosing window      Electronically signed by:   Tobias JONELLE Pereyra, RN

## 2024-03-07 NOTE — Progress Notes (Signed)
 [facility]    Adult Medical Nutrition Therapy - Initial Assessment/Care Plan      Date: 03/07/2024    Patient Name: Kelly Cervantes    Date of Birth / Age / Birth Gender: [birth year] / 53 y.o. / Female    Referring MD or Clinic: Self, Referred    Interpreter: No    Reason for Referral: General, healthful diet order yes: weight management and weight reduction    Nutritional Concerns/Comments: 53 y.o. female reports to follow-up nutrition visit with BMI of 30.64 kg/m2 and  has a past medical history of Anemia (2010), Anxiety (2014), Arthritis (2019), Bipolar 1 disorder    (CMS-HCC) (04/13/2012), Clotting disorder (HHS-HCC) (2005), Depression (2009), HIV disease    (CMS-HCC) (2009), Hyperlipidemia (03/09/2023), Hypertension (2014), Neuromuscular disorder (CMS-HCC) (2022), and Obesity (2022). seeking nutritional counseling to mitigate elevated weight.     Since follow nutrition visit, pt reports continues adherence to nutrition-related goals but notes increased night-time eating and episodic sleep-related eating between 2-4am which may be inhibiting her from losing weight. Pt also notes she has OSA but hasn't used her CPAP for >1 month--possibly another reason why she isn't seeing weight-loss results.   Regarding her previous constipation, pt reports amelioration of symptoms with following current bowel regimen (senna tea + psyllium husk).     Today, RD and pt discussed:  -high protein HS snacks  -stable meal schedule  -reducing fluids at night  -continuation of physical activity  -my plate guidelines    DIET: If pt eats breakfast, she will have almond milk with granola or grapes, 1 bagel and boiled eggs with no sugar/diet juice. She drinks coffee with 1/2 and 1/2 creamer. For lunch, pt will have a sandwich or will skip. For dinner, pt will have spaghetti with lean beef, corn on the cob and salad. 930pm: pt will have 2 apples. Middle of the night, pt will have more apples or grapes.    PHYSICAL ACTIVITY: Pt walking more often and utilizing resistance band for arm and leg exercises (pt self-reports the use of band 7x in the past month) and doing chair exercises.     FOOD INSECURITY: Pt screens positive. Pt [facility]. Pt does not access a food pantry. RD re-educated pt on double bucks program to purchase fresh fruits, vegetables.    Anthropometric Data:    Height: 5'7  Wt: 195 lbs  BMI: 30.64 kg/m2    Weight History:  Wt Readings from Last 6 Encounters:   03/07/24 88.7 kg (195 lb 9.6 oz)   01/11/24 88.6 kg (195 lb 6.4 oz)   12/15/23 88.5 kg (195 lb 3.2 oz)   12/14/23 88.2 kg (194 lb 6.4 oz)   11/16/23 88 kg (194 lb)   10/26/23 88.5 kg (195 lb 3.2 oz)       Physical Findings Data:     BP Today: N/A (telehealth visit)  BP History:  BP Readings from Last 6 Encounters:   03/07/24 134/91   01/11/24 134/85   12/15/23 138/97   11/16/23 125/83   09/14/23 158/90   08/25/23 127/91       Biochemical Data:  A1C:    Hemoglobin A1C   Date Value Ref Range Status   06/01/2023 5.2 4.8 - 5.6 % Final     Estimated Blood Glucose:   Lab Results   Component Value Date    Estimated Average Glucose 103 06/01/2023     Total Cholesterol:   Cholesterol, Total  Date Value Ref Range Status   03/30/2023 148 <=200 mg/dL Final     Trig:   Triglycerides   Date Value Ref Range Status   03/30/2023 81 0 - 150 mg/dL Final     HDL:   Cholesterol, HDL   Date Value Ref Range Status   03/30/2023 63 (H) 40 - 60 mg/dL Final      LDL:   Cholesterol, LDL, Calculated   Date Value Ref Range Status   03/30/2023 69 40 - 99 mg/dL Final     Comment:     NHLBI Recommended Ranges, LDL Cholesterol, for Adults (20+yrs) (ATPIII), mg/dL  Optimal              <899  Near Optimal        100-129  Borderline High     130-159  High                160-189  Very High            >=190  NHLBI Recommended Ranges, LDL Cholesterol, for Children (2-19 yrs), mg/dL  Desirable            <889  Borderline High     110-129  High                 >=130         Current Medications, Herbs, Supplements:    Current Outpatient Medications:     albuterol  HFA 90 mcg/actuation inhaler, Inhale 2 puffs every six (6) hours as needed., Disp: , Rfl:     amlodipine  (NORVASC ) 5 MG tablet, Take 1 tablet (5 mg total) by mouth daily., Disp: 30 tablet, Rfl: 11    atorvastatin  (LIPITOR) 20 MG tablet, TAKE 1 TABLET BY MOUTH EVERY DAY, Disp: 100 tablet, Rfl: 3    budesonide-formoterol (SYMBICORT ) 160-4.5 mcg/actuation inhaler, Inhale 2 puffs  in the morning and 2 puffs in the evening., Disp: 10.2 g, Rfl: 11    buPROPion  (WELLBUTRIN  XL) 300 MG 24 hr tablet, TAKE 1 TABLET BY MOUTH DAILY, Disp: 30 tablet, Rfl: 0    cabotegravir -rilpivirine  (CABENUVA ) 600 mg-900 mg/3 mL extended-release injection, Inject 6 mL into the muscle every 8 weeks., Disp: 6 mL, Rfl: 6    celecoxib  (CELEBREX ) 200 MG capsule, TAKE 1 CAPSULE (200 MG TOTAL) BY MOUTH TWO (2) TIMES A DAY AS NEEDED FOR PAIN., Disp: 60 capsule, Rfl: 2    FLUoxetine  (PROZAC ) 40 MG capsule, TAKE 2 CAPSULES BY MOUTH DAILY, Disp: 60 capsule, Rfl: 0    gabapentin  (NEURONTIN ) 100 MG capsule, Take 1 capsule (100 mg total) by mouth nightly for 3 days, THEN 2 capsules (200 mg total) nightly for 3 days, THEN 3 capsules (300 mg total) nightly., Disp: 99 capsule, Rfl: 0    galcanezumab -gnlm (EMGALITY ) 120 mg/mL injection, Inject 2 pens (240 mg) under the skin every twenty-eight (28) days for 28 days, THEN 1 pen (120 mg) every twenty-eight (28) days., Disp: 4 mL, Rfl: 3    hydrOXYzine  (ATARAX ) 50 MG tablet, Take 1 tablet (50 mg total) by mouth two (2) times a day as needed for anxiety., Disp: 60 tablet, Rfl: 1    omeprazole  (PRILOSEC) 40 MG capsule, Take 1 capsule (40 mg total) by mouth two (2) times a day., Disp: 180 capsule, Rfl: 2    prazosin  (MINIPRESS ) 1 MG capsule, TAKE 2 CAPSULES BY MOUTH EVERY  NIGHT, Disp: 60 capsule, Rfl: 0    rimegepant (NURTEC ODT ) 75 mg TbDL, Take 1 tablet (  75 mg total) by mouth once as needed at the onset of migraine. NO MORE THAN 1 dose in 24 hours., Disp: 8 tablet, Rfl: 11    traZODone  (DESYREL ) 50 MG tablet, TAKE 1 TABLET BY MOUTH NIGHTLY, Disp: 30 tablet, Rfl: 11  No current facility-administered medications for this visit.    Facility-Administered Medications Ordered in Other Visits:     cabotegravir -rilpivirine  (CABENUVA ) 600 mg-900 mg/3 mL extended-release injection 6 mL, 6 mL, Intramuscular, Once, Lafi, Yousef N, CPP    dexAMETHasone (DECADRON) 4 mg/mL injection 20 mg, 20 mg, Intravenous, Once PRN, Lafi, Yousef N, CPP    diphenhydrAMINE (BENADRYL) injection 25 mg, 25 mg, Intravenous, Once PRN, Lafi, Yousef N, CPP    EPINEPHrine (EPIPEN) injection 0.3 mg, 0.3 mg, Intramuscular, Once PRN, Lafi, Yousef N, CPP    famotidine (PF) (PEPCID) injection 20 mg, 20 mg, Intravenous, Once PRN, Lafi, Yousef N, CPP    methylPREDNISolone  sodium succinate (SOLU-Medrol ) injection 125 mg, 125 mg, Intravenous, Once PRN, Lafi, Yousef N, CPP    sodium chloride (NS) 0.9 % infusion, 20 mL/hr, Intravenous, Continuous PRN, Lafi, Yousef N, CPP    sodium chloride 0.9% (NS) bolus 1,000 mL, 1,000 mL, Intravenous, Once PRN, Lafi, Yousef N, CPP      HIV:   Lab Results   Component Value Date    ACD4 1,274 06/01/2023    CD4 49 06/01/2023    HIVRS Not Detected 12/14/2023    HIVCP 5,030 06/01/2018        Ecosocial History:  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 For each statement, please tell me whether the statement was often true, sometimes true, or never true for your household in the 12 months.     1. ???We worried whether our food would run out before we got money to buy more.???   Sometimes True     2. ???The food that we bought just didn't last, and we didn't have money to get more.  Sometimes True       Usual Daily Food Choices:      24-Hour Recall/Usual Intake:  Time Intake   Breakfast 2 boiled eggs, 1 apple, 1 bagel  Coffee with half and half  Zero-sugar juice   Snack (AM)    Lunch    Snack (PM)    Dinner 5pm: Spaghetti with lean beef, corn on the cob, salad  Water (bottle and half)  2 apples (930pm)   Snack (HS)   Grapes, apples       Behavioral Factors:  Overeating: No issues noted.  Emotional Eating: No issues noted.  Grazing: Frequently grazes chips, grapes, crackers.   Fast Eating: No issues noted.  Nighttime Eating: Nighttime snacking intermittent 2-4am. Pt sometimes is aware of this behavior, sometimes not.    Factors Affecting Food Intake:  Knowledge and Beliefs: Has improved adherence to nutrition-related recommendations.   She has willingness to try new foods.  Stress/Anxiety: Patient with medical history of depression., Patient with medical history of anxiety.   Sleep Patterns: Patient with obstructive sleep apnea. Using CPAP irregularly and only up to 3 hours. Pt notes multiple night-time wakenings, sometimes to eat.    Food  Safety and Access: She noted limited finances and resources.  She has SNAP/EBT benefits.   Other: n/a      Food Intolerances/Dietary Restrictions:  No known food allergies or food intolerances.     Other GI Issues:  No issues noted.    Hunger and Satiety:  Denied issues.       Allergies:  is allergic to ibuprofen, naproxen sodium, and naproxen.      Usual Daily Physical Activity Factors: >/=1.0 to <1.4 (Sedentary)  Method for estimating physical activity factor: 2020-2025 DRI Guidelines    Estimated Adherence:  Client self-reported adherence score: 8.5  Method for estimating adherence: Adult Meducation Readiness Ruler      NUTRITION DIAGNOSIS:   Inadequate  intake of grains  Excessive  intake of fruits  Inadequate  intake of vegetables  Inadequate  intake of milk/milk products    Method for estimating nutritional adequacy: 2020-2025 DRI Guidelines Clinical Diagnosis:   Overweight/obesity related to excessive calorie intake, increased night-time eating evidenced by a BMI of 30.64 kg/m2, inability to lose weight, and self-reported waking multiple times during the night to eat.     NUTRITION INTERVENTIONS/PLAN OF CARE    Nutrition Counseling   Nutrition Education     Coordination of care via Mail and Medical Record    Client agreed to follow interventions:  Yes       Nutrition-Related Medication Management: Nutrition-related complimentary/alternative medicine      Nutrition Goals:  Start wearing CPAP nightly.   Replace 2 apples night snack with either of the following:  [place] balanced break (cheese, nut, cranberry mix)  Low-sodium turkey slices with low-fat cheese slice  No sugar added greek yogurt  1-2 tbsp peanut butter  Palm size mixed nuts  3. Cut off water intake around 5pm. Can take little sips but avoid drinking large amounts before bedtime.  4. Continue to follow myplate guidelines at dinner time.  5. Continue to prioritize a lean protein source at each meal with a total daily goal of >100 g.  Eggs  Chicken  Greek yogurt  Beans  Peanut butter  Low-fat milk  Turkey  Fish  Shrimp  Tuna and chicken salad made with lower-fat mayonnaise  Nuts-cashews, peanuts, pecans  Protein powder  6. Continue following bowel regimen:  -smooth move tea at night  -psyllium husk with water in AM (1-3 tsp)  7. Continue exercising with resistance band and increase walking mileage to 1 mile 3-4x per week.  8. Portion control tips:  -consume protein and fruit/vegetable first during meals before carbohydrates  -always consume meals sitting at a table without screens or tablets or phones      Patient Education:  Individualized nutrition counseling and education provided on:   Lean Protein Sources  Fiber-Rich Foods  Water Intake  Portion control tips    Materials Provided:  Patient instructions: Prominent nutrition related recommendations.   Handouts supporting dietary recommendations: smart snacking, reading a nutrition facts label, my plate guidelines  Additional Materials: Written nutrition tips         NUTRITION MONITORING AND EVALUATION:  Follow-up appointment: in clinic January 7th at 130pm      Goals for next appointment:   1.Start wearing CPAP nightly.   2.Replace 2 apples night snack with either of the following:  [place] balanced break (cheese, nut, cranberry mix)  Low-sodium turkey slices with low-fat cheese slice  No sugar added greek yogurt  1-2 tbsp peanut butter  Palm size mixed nuts  3. Cut off  water intake around 5pm. Can take little sips but avoid drinking large amounts before bedtime.  4. Continue to follow myplate guidelines at dinner time.  5. Continue to prioritize a lean protein source at each meal with a total daily goal of >100 g.  Eggs  Chicken  Greek yogurt  Beans  Peanut butter  Low-fat milk  Turkey  Fish  Shrimp  Tuna and chicken salad made with lower-fat mayonnaise  Nuts-cashews, peanuts, pecans  Protein powder  6. Continue following bowel regimen:  -smooth move tea at night  -psyllium husk with water in AM (1-3 tsp)  7. Continue exercising with resistance band and increase walking mileage to 1 mile 3-4x per week.  8. Portion control tips:  -consume protein and fruit/vegetable first during meals before carbohydrates  -always consume meals sitting at a table without screens or tablets or phones    Length of visit was: 32 minutes    Signed:  Karilyn DELENA Areola  03/07/2024 10:35 AM

## 2024-03-07 NOTE — Progress Notes (Signed)
 Patient completed [name] only application.. Eligible for RW B&C grant services and Caps on Charges. IPL= 85%, FPL= 63%. Expires 04/25/2025- Rw0-submitted     RW Eligibility Form informing patient about RW services and Caps on charges was sent to patient via MyChart Message            Kelly Cervantes  ID Clinic Benefits Counselor  Time of Intervention-51mins

## 2024-03-07 NOTE — Progress Notes (Signed)
 Name: Kelly Cervantes  Date: 03/07/2024  Address: [address] [name]  [place] [address]   [place] of Residence:  [facility]  Phone: [phone]     Started assessment with patient options: in clinic    Is this the same address for mailing? Yes  If no, Mailing Address is:     What is your preferred method of contact? Phone Call    Is there anyone that you would want to add as your personal contact? No; if yes, please use SmartPhrase RWPersonalContactInfo to gather their contacts information.    N/a    Housing Status  Stable/Permanent; if so, what is their housing type: Renting and living - room, house, or apartment    Insurance  Medicaid and Medicare Part C    Marital Status  Separated    Tax Press Photographer Status  I did not file taxes     Employment Status  Medically Unable to Work    Income  Tree Surgeon (Retirement/Survivor's/Disability)    If no or low income, how are you meeting your basic needs?  Family Support and Food Stamps/EBT    List Tax Household Members including relationship to you:   [name] (Daughter)     Someone in my household receives: No Household Income/Deductions of any kind  Specify who: n/a    Do you or anyone in your home have income adjustments? No    If yes, which adjustments do you have? N/A      Medication Access/Barriers: n/a    Do you have a current diagnosis for Hepatitis C?  Lab Results   Component Value Date    HEPCAB Reactive (A) 01/05/2008    HCVRNA Not Detected 02/04/2021    HCVRNAIU 44 06/20/2020       Federal Marketplace Eligibility Assessment  Patient has affordable insurance through [employer], [place], and or Employment and is not eligible.    Patient given ACA education if they qualified based on answers to questions above.     MyChart  Do you have an active MyChart account? Yes     If MyChart is not set up, informed patient on how to set up MyChart Yes    Patient was informed of the following programs;   N/A    The following applications/handouts were given to patient:   N/A    The following forms were also started with the patient:   N/A    Medicaid:  Approved      [employer]/HMAP application status: Complete    Patient is applying for [employer] on Charges Only     Additional Comments: No issues accessing meds.    ____________________________________________________________________    The patient provides verbal consent and agrees to the following:    RW Consent  I agree to notify the interviewer within 30 days about any changes to my address, financial resources, expenses, family situation, or health insurance coverage that may impact my eligibility for Department payment programs. I certify that the information I have provided is accurate and complete to the best of my knowledge. I understand that information provided may be verified by a state reviewer, and I agree to submit any necessary financial records required for this review. I also understand that my employer may be asked to verify information concerning my income.    Caps on Charges Consent  The [employer] Program requires that both insured and uninsured individuals be charged no more than a specified maximum amount in a calendar year, based on  their individual income.                [name]  [facility] Benefits Counselor  Time of Intervention-44mins

## 2024-03-07 NOTE — Patient Instructions (Addendum)
 Goals for next appointment:   Start wearing CPAP nightly.   Replace 2 apples night snack with either of the following:  [place] balanced break (cheese, nut, cranberry mix)  Low-sodium turkey slices with low-fat cheese slice  No sugar added greek yogurt  1-2 tbsp peanut butter  Palm size mixed nuts  3. Cut off water intake around 5pm. Can take little sips but avoid drinking large amounts before bedtime.  4. Continue to follow myplate guidelines at dinner time.  5. Continue to prioritize a lean protein source at each meal with a total daily goal of >100 g.  Eggs  Chicken  Greek yogurt  Beans  Peanut butter  Low-fat milk  Turkey  Fish  Shrimp  Tuna and chicken salad made with lower-fat mayonnaise  Nuts-cashews, peanuts, pecans  Protein powder  6. Continue following bowel regimen:  -smooth move tea at night  -psyllium husk with water in AM (1-3 tsp)  7. Continue exercising with resistance band and increase walking mileage to 1 mile 3-4x per week.  8. Portion control tips:  -consume protein and fruit/vegetable first during meals before carbohydrates  -always consume meals sitting at a table without screens or tablets or phones
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
## 2024-03-21 MED ORDER — BUPROPION HCL XL 300 MG 24 HR TABLET, EXTENDED RELEASE
ORAL_TABLET | Freq: Every day | ORAL | 11 refills | 0.00000 days
Start: 2024-03-21 — End: ?

## 2024-03-21 MED ORDER — PRAZOSIN 1 MG CAPSULE
ORAL_CAPSULE | 11 refills | 0.00000 days
Start: 2024-03-21 — End: ?

## 2024-03-25 DIAGNOSIS — F411 Generalized anxiety disorder: Principal | ICD-10-CM

## 2024-03-25 MED ORDER — BUSPIRONE 15 MG TABLET
ORAL_TABLET | Freq: Two times a day (BID) | ORAL | 0 refills | 0.00000 days
Start: 2024-03-25 — End: ?

## 2024-03-26 MED ORDER — BUPROPION HCL XL 300 MG 24 HR TABLET, EXTENDED RELEASE
ORAL_TABLET | Freq: Every day | ORAL | 0 refills | 30.00000 days | Status: CP
Start: 2024-03-26 — End: ?

## 2024-03-26 MED ORDER — PRAZOSIN 1 MG CAPSULE
ORAL_CAPSULE | ORAL | 0 refills | 0.00000 days | Status: CP
Start: 2024-03-26 — End: ?

## 2024-03-26 MED ORDER — BUSPIRONE 15 MG TABLET
ORAL_TABLET | Freq: Two times a day (BID) | ORAL | 0 refills | 30.00000 days
Start: 2024-03-26 — End: ?

## 2024-03-29 DIAGNOSIS — M25532 Pain in left wrist: Principal | ICD-10-CM

## 2024-03-29 DIAGNOSIS — M25562 Pain in left knee: Principal | ICD-10-CM

## 2024-03-29 DIAGNOSIS — M1711 Unilateral primary osteoarthritis, right knee: Principal | ICD-10-CM

## 2024-03-29 DIAGNOSIS — M25531 Pain in right wrist: Principal | ICD-10-CM

## 2024-03-29 DIAGNOSIS — E785 Hyperlipidemia, unspecified: Principal | ICD-10-CM

## 2024-03-29 MED ORDER — CELECOXIB 200 MG CAPSULE
ORAL_CAPSULE | Freq: Two times a day (BID) | ORAL | 0 refills | 30.00000 days | Status: CP | PRN
Start: 2024-03-29 — End: ?

## 2024-03-29 MED ORDER — ATORVASTATIN 20 MG TABLET
ORAL_TABLET | Freq: Every day | ORAL | 3 refills | 100.00000 days | Status: CP
Start: 2024-03-29 — End: ?

## 2024-03-29 NOTE — Telephone Encounter (Signed)
 Copied from CRM #[id number]. Topic: Access To Clinicians - Medication Refill  >> Mar 29, 2024  9:52 AM Mikel D wrote:      [facility] HERO. With patients [employer] states patient called them to please request this Rx of her Doc office is requesting the following:     Medication Name(s), Dose/Strengths, and Instructions:       atorvastatin  (LIPITOR) 20 MG tablet [[id number]     celecoxib  (CELEBREX ) 200 MG capsule [[id number]     Quantity for: 100 tablet (100 day supply)     Pharmacy name and address:     [facility] - [place], [place] - [address]      Coverage: yes, coverage is accurate on file.    Medication request turnaround time: 72 business hours. (Caller Notified)          Does the caller want to be contacted regarding this request? She states patient did not specify

## 2024-03-29 NOTE — Addendum Note (Signed)
 Addended by: Felesia Stahlecker S on: 03/29/2024 11:44 AM     Modules accepted: Orders

## 2024-03-30 MED ORDER — CELECOXIB 200 MG CAPSULE
ORAL_CAPSULE | Freq: Two times a day (BID) | ORAL | 2 refills | 30.00000 days | Status: CP | PRN
Start: 2024-03-30 — End: ?
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
## 2024-04-06 DIAGNOSIS — F411 Generalized anxiety disorder: Principal | ICD-10-CM

## 2024-04-06 MED ORDER — PRAZOSIN 1 MG CAPSULE
ORAL_CAPSULE | Freq: Every evening | ORAL | 2 refills | 30.00000 days | Status: CP
Start: 2024-04-06 — End: ?

## 2024-04-06 MED ORDER — FLUOXETINE 40 MG CAPSULE
ORAL_CAPSULE | Freq: Every day | ORAL | 0 refills | 90.00000 days | Status: CP
Start: 2024-04-06 — End: 2024-07-05

## 2024-04-06 MED ORDER — HYDROXYZINE HCL 50 MG TABLET
ORAL_TABLET | Freq: Two times a day (BID) | ORAL | 0 refills | 90.00000 days | Status: CP | PRN
Start: 2024-04-06 — End: 2024-07-05

## 2024-04-06 MED ORDER — BUPROPION HCL XL 300 MG 24 HR TABLET, EXTENDED RELEASE
ORAL_TABLET | Freq: Every day | ORAL | 0 refills | 90.00000 days | Status: CP
Start: 2024-04-06 — End: ?

## 2024-04-06 MED ORDER — TRAZODONE 50 MG TABLET
ORAL_TABLET | Freq: Every evening | ORAL | 0 refills | 90.00000 days | Status: CP
Start: 2024-04-06 — End: 2024-07-05

## 2024-04-06 NOTE — Progress Notes (Signed)
 This was a telehealth service and I was in person with the resident.  As the attending physician, I spent 5 minutes in medical discussion with the patient via real-time audio and video, participating in the key portions of the service.  I spent an additional 5 minutes on pre- and post-visit activities which were specific to the patient and included reviewing the patient???s medical records, lab results, imaging results, and other pertinent records. I reviewed the resident's note and I agree with the resident's findings and plan.

## 2024-04-06 NOTE — Patient Instructions (Signed)
 Follow-up instructions:  -- INCREASE to prazosin  3 mg nightly.  - Please continue taking your medications as prescribed for your mental health.   - Do not make changes to your medications, including taking more or less than prescribed, unless under the supervision of your physician. Be aware that some medications may make you feel worse if abruptly stopped  - Please refrain from using illicit substances, as these can affect your mood and could cause anxiety or other concerning symptoms.   - Seek further medical care for any increase in symptoms or new symptoms such as thoughts of wanting to hurt yourself or hurt others.     Contact info:  Life-threatening emergencies: Call 911, the 988 suicide and crisis lifeline, or go to the nearest ER for medical or psychiatric attention.           Issues that need urgent attention but are not life threatening: Call the clinic outpatient front desk for assistance:    - [phone] for [facility] adult psychiatry clinics located at [address] [facility]  - [phone] for [facility] child and adolescent psychiatry clinics located at [address]  - [phone] for [facility] and adult psychiatry clinics located at [address] [facility]    Non-urgent routine concerns and questions: Send a message through [facility] or call our clinic front desk.    Refill requests: Check with your pharmacy to initiate refill requests.    Regarding appointments:  - If you need to cancel your appointment, we ask that you call your clinic at least 24 hours before your scheduled appointment at the numbers listed above.  - If for any reason you arrive 15 minutes later than your scheduled appointment time, you may not be seen and your visit may be rescheduled.  - Please remember that we will not automatically reschedule missed appointments.  - If you miss two (2) appointments without letting us  know in advance, you will likely be referred to a provider in your community.  - We will do our best to be on time. Sometimes an emergency will arise that might cause your clinician to be late. We will try to inform you of this when you check in for your appointment. If you wait more than 15 minutes past your appointment time without such notice, please speak with the front desk staff.    In the event of bad weather, the clinic staff will attempt to contact you, should your appointment need to be rescheduled. Additionally, you can call the Patient Weather Line [phone] for system-wide clinic status    For more information and reminders regarding clinic policies (these were provided when you were admitted to the clinic), please ask the front desk.

## 2024-04-23 DIAGNOSIS — E785 Hyperlipidemia, unspecified: Principal | ICD-10-CM

## 2024-04-23 DIAGNOSIS — I10 Essential (primary) hypertension: Principal | ICD-10-CM

## 2024-04-23 MED ORDER — ATORVASTATIN 20 MG TABLET
ORAL_TABLET | Freq: Every day | ORAL | 3 refills | 100.00000 days
Start: 2024-04-23 — End: ?

## 2024-04-23 MED ORDER — AMLODIPINE 5 MG TABLET
ORAL_TABLET | Freq: Every day | ORAL | 11 refills | 30.00000 days
Start: 2024-04-23 — End: 2025-04-23

## 2024-04-23 NOTE — Telephone Encounter (Signed)
 Returned call fro [employer].  Stated on Voicemail that delivery date for Cabenuva  to the clinic would need to be 12/31.  Clarified with [facility] that the new delivery date would not be an issue.   To arrive 12/31.

## 2024-04-23 NOTE — Telephone Encounter (Signed)
 Called [facility] to order cabenuva  to clinic. Order was placed for 04/24/2024.

## 2024-04-25 MED ORDER — ATORVASTATIN 20 MG TABLET
ORAL_TABLET | Freq: Every day | ORAL | 3 refills | 100.00000 days | Status: CP
Start: 2024-04-25 — End: ?

## 2024-04-25 MED ORDER — AMLODIPINE 5 MG TABLET
ORAL_TABLET | Freq: Every day | ORAL | 11 refills | 30.00000 days | Status: CP
Start: 2024-04-25 — End: 2025-04-25

## 2024-04-29 DIAGNOSIS — Z21 Asymptomatic human immunodeficiency virus [HIV] infection status: Principal | ICD-10-CM

## 2024-05-01 DIAGNOSIS — G43909 Migraine, unspecified, not intractable, without status migrainosus: Principal | ICD-10-CM

## 2024-05-01 DIAGNOSIS — G43E19 Intractable chronic migraine with aura and without status migrainosus: Principal | ICD-10-CM

## 2024-05-01 MED ORDER — GALCANEZUMAB-GNLM 120 MG/ML SUBCUTANEOUS PEN INJECTOR
SUBCUTANEOUS | 3 refills | 84.00000 days | Status: CP
Start: 2024-05-01 — End: 2025-05-01

## 2024-05-01 NOTE — Patient Instructions (Addendum)
 It has been my pleasure to participate in your care.     Here is a brief summary of our discussion today:    Continue EMGALITY  every 4 weeks.   Continue to track all your symptoms (see below for a sample headache diary).           Warmest Regards,    Dr. Shasta JAYSON KANDICE Hezzie  Board Certified: Adult Neurology & Vascular Neurology  [facility] Department of Internal Medicine  [facility] Department of Neurology  [place], [place]    Please note: All results will be released immediately in MyChart.   Please allow 2 days for me to review them and send an interpretation.    Do not use MyChart for urgent issues.     Frequently Requested Information:  Internal Medicine Clinic:    [phone]   Fax: [phone]  Geriatric Specialty Clinic:  [phone]   Fax: [phone]   After Hours: [phone].        Scheduling Tests or Infusions? (Please disregard any not useful to you)   MRI scan, CT scan, DAT/PET Scan:  [phone]  EMG [phone]  (DONE AT [facility] MAIN CAMPUS ONLY)  EEG: [phone]  (DONE AT [facility] MAIN CAMPUS ONLY)  Evoked Potentials [phone]  (DONE AT [facility] MAIN CAMPUS ONLY)  Autonomic Testing [phone]  (DONE AT [facility] MAIN CAMPUS ONLY)  Infusion Center at [facility]: [phone]  Sleep Study: [phone]   Memory/Cognitive testing: [phone]   Myelogram CT: There are 2 steps for this test that must be scheduled in this sequence: FIRST: Myelogram: [phone], THEN schedule CT (ideally same day) [phone] option 2    Need help paying for medications, the cost of care or a procedure estimate?   [employer] Financial Assistance Estimates  [phone]- option 3 Estimates Help - [facility] (unchealthcare.org)   Mdsave.com: for help with estimate for procedures like MRI or CT near you.   Mail Order Pharmacies: [facility]  [phone]. Refills: [phone] Option 1  Automated Touchtone Prescription Refill System. Option 2  Speak with a Representative (M-F 8-4:30 PM)  Costplusdrugs.com  Pharmacy.amazon.com  COUPONS/CoPay assistance:GoodRx.com  or MANUFACTURER Co-Pay card    LEARN MORE ABOUT YOUR BRAIN and NEUROLOGICAL HEALTH:   www.brainandlife.org Regular issue from [employer]  Stroke Recovery and Support (ringtonefundraiser.se)  Learn more about memory disorders.   Maintaining your brain health: tonerproviders.gl  Reducing your risk for dementia: http://www.smith-williams.com/  RESEARCH OPPORTUNITIES: Popularflicks.co.nz.        Thank you for choosing [facility]: Changing Lives for the Better.
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
## 2024-05-01 NOTE — Progress Notes (Signed)
 TELENEUROLOGY REPORT    Prior visit with this Neurologist:  10/13/2023  [facility]: Dr Oneita.             ICD-10-CM   1. Migraine without status migrainosus, not intractable, unspecified migraine type  G43.909   2. Intractable chronic migraine with aura and without status migrainosus  G43.E19       Kelly Cervantes is a 54 y.o. female who presents for evaluation of headaches since 2005, initially with moderate to severe throbbing headaches lasting more than 4 hours associated with nausea +/- vomiting and/or photophobia and photophobia, that met International Classification of Headache Disorders criteria for Chronic Migraine. With EMGALITY  prophylaxis, converted to episodic migraine without side effects with headaches less than 1-2 per week with effective lasting relief with Nurtec. Previously headaches were lasting 2 hours to 14 days.     HIT 6 is 57 today.     PLAN:     Medication Management:   For migraine prophylaxis, will continue EMGALITY  every 4 weeks and   For migraine abortive Rx, rizatriptan  and PRN Nurtec, no more than 1 dose in 24 hours.   No more spells. Will hold off on EEG/Sleep Study reordering at this time.    HEALTH EDUCATION/HEALTH LITERACY: Counseling included, in part, the following: I reviewed my impression and findings and the plan of care in detail, allowing for time to address questions and concerns to the patient's stated satisfaction. Reviewed the differential diagnosis, plan of care in detail, as well as other elements as detailed above. The benefits and risks of each of the above options, including procedures if appropriate, benefits and alternative options were reviewed with the patient. Headache calendar- discussed- to bring at next visit. This will help us  track for improvement over time and any necessary modifications to the treatment plan.     Continuity of care: I am planning to provided needed longitudinal care that are part of the ongoing, longitudinal care relationship related to a patient???s single, serious condition or complex condition(s) Diagnoses of Migraine without status migrainosus, not intractable, unspecified migraine type and Intractable chronic migraine with aura and without status migrainosus were pertinent to this visit.. The patient's care will require a physician-patient relationship that long-term helps the patient with improved care, reduced burden of symptoms, medication management and overall improvement in QOL.     Return in about 11 months (around 03/31/2025) for Video OK, Return for re-evaluation of current plan of care.Kelly  Cervantes calendar, EMGALITY  efficacy/access/side effects  Rizatriptan /Nurtec efficacy/access/side effects         Subjective      The history is obtained from the patient. Additional history is obtained from review of available medical records. Open-ended and close-ended questions, as well as review of EPIC chart, including CareEverywhere if available.    The patient is unaccompanied.    [name] is a 54 y.o. right handed female seen in followup for evaluation and management with medication management of migraine (since 2005) and spells. Normal brain MRI in May 2025. At prior visit, she also reported spells- PSG and Ambulatory EEG were ordered (not yet completed).     Please see prior notes for details of that visit.     Typically 1 migraine a month- helped with rizatriptan /nurtec. 3-4 other milder headaches that impact her overall life.     Since last visit:     Headaches:   Resumed EMGALITY . No side effects.       Spells:  No more spells.   (  Has yet to schedule Home Sleep Study and EEG.)-   Brain MRI unremarkable.     Medications:     Prophylaxis:  Nurtec every other day within the past month.   Botox in April 2022 for 3 treatments. Helped. She moved and this was discontinued ([place]).   Zonisamide - Started in 01/2021. just recently stopped after 1 year- did not help.   Injections in head and neck- last time was 3 months.   Lyrica  75 mg BID  Sertraline 150 mg daily  Wellbutrin  150 mg  Trazodone   Verapamil CR 240 mg  Hydrochlorothiazide   Amlodipine   Lisinopril  Nortriptyline  Venlafaxine  Topiramate  Gabapentin   Emgality - for a while. HELPED.  INSURANCE required that stop. RESUMED 08/25/2023    Allegra    Abortive:   Nurtec. Helped (2023) Prescribed by Dr. At [facility].   Rizatriptan . Helps alleviate the pain.   Ibuprofen 800 MG GI side effects NOT ALLERGIC  Tizanidine . Helped for a while. Discontinued due to poor efficacy.   Sumatriptan  20 MG NS  Sumatriptan  50 MG pills  Baclofen   Zofran  Cold packs  Fioricet    RESCUE:   Promethazine  (PHENERGAN ) 25 MG suppository; Insert 1 suppository (25 mg total) into the rectum every six (6) hours as needed for nausea.  Dispense: 12 suppository; Refill: 1    FAMILY HISTORY: Mother: Migraines.  PSH: Liver tumor removed 2022    Objective   GENERAL PHYSICAL EXAM:    Vital Signs: Televisit  BP Readings from Last 6 Encounters:   03/07/24 134/91   01/11/24 134/85   12/15/23 138/97   11/16/23 125/83   09/14/23 158/90   08/25/23 127/91     General Appearance: The patient was well-groomed and neat, engaged and cooperative.     Head:  Atraumatic, normocephalic.    Neurological Exam    Mental Status  Awake and alert. Oriented to person, place and time. Speech is normal. Language is fluent with no aphasia. Attention and concentration are normal.     Medical Decision Making and Data review:   I have independently reviewed the Medical record, including Nursing notes, social determinants of health, prior available records (CareEverywhere where available/appropriate). I have obtained and reviewed the history, performed an appropriate examination.  I have reviewed the patients problem list, medical history, surgical history, laboratory history and recent hospitalizations, current medications, allergies, and social history and updated them as needed.  CT 01/2020- reported in [facility] from [facility] 10/22- normal.  Results for orders placed or performed during the hospital encounter of 09/06/23   MRI Brain W Wo Contrast   EXAM: Magnetic resonance imaging, brain, without contrast material.  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 contrast.    Field strength: 1.5T    FINDINGS:      Total microhemorrhages (based on SWI): None.    Superficial siderosis: None.    T2/FLAIR sulcal hyperintensities: None    No evidence of significant parenchymal volume loss. There scattered foci of periventricular and deep white matter signal abnormality, which are nonspecific but commonly associated with chronic microangiopathic white matter disease. Ventricles are normal in size. There is no  midline shift. No extra-axial fluid collection. No evidence of intracranial hemorrhage. No diffusion weighted signal abnormality to suggest acute infarct. No mass.    Fazekas scale:  Deep white matter (DWM): 1 = punctate foci  Periventricular white matter (PVWM): 2 = smooth halo     Impression   -- No acute intracranial abnormalities.   -- Mild chronic microangiopathic white matter disease.  -- Total microhemorrhages: None  -- Superficial siderosis is Not detected      I have reviewed the diagnosis, clinical findings and plan of care in layman's terms, ensuring that the patient has had an opportunity to address any/all questions and/or concerns to their reported satisfaction. Counseling provided included: discussing my clinical reasoning and clinical impression, reviewing a differential diagnosis and a review of my recommendations for a plan of treatment as documented above. In addition, time was provided to address alternative options for care, health literacy regarding the differential diagnosis, testing and medication options, the benefits of current plan. A brief written summary was provided to the patient at the time of the visit, as well as instructions on how to access My Chart. My contact information was provided along with instructions on how to reach us . The patient was also instructed to contact the referring provider, PCP or our team, for any worsening or changing symptoms.      The patient reports they are physically located in [place]  and is currently: at home. I conducted a audio/video visit. I spent  32m 04s on the video call with the patient. I spent an additional 9 minutes on pre- and post-visit activities on the date of service .     Dr. Shasta JAYSON KANDICE Cervantes, FAAN  Board Certified: Adult Neurology & Vascular Neurology  Department of Internal Medicine & Department of Neurology    CC: Myrna Velia Canales, MD  Dictated using voice-activated software with or without AI Scribe (with patient's verbal consent). Despite editing, typographical errors may exist.

## 2024-05-02 DIAGNOSIS — E66811 Obesity, Class I, BMI 30-34.9: Principal | ICD-10-CM

## 2024-05-02 DIAGNOSIS — Z21 Asymptomatic human immunodeficiency virus [HIV] infection status: Principal | ICD-10-CM

## 2024-05-02 MED ADMIN — cabotegravir-rilpivirine (CABENUVA) 600 mg-900 mg/3 mL extended-release injection 6 mL: 6 mL | INTRAMUSCULAR | @ 19:00:00 | Stop: 2024-05-02

## 2024-05-02 NOTE — Patient Instructions (Signed)
 Goals for next appointment:   1.Start wearing CPAP nightly.   2.Replace 2 apples night snack with either of the following:  Sargento balanced break (cheese, nut, cranberry mix)  Low-sodium turkey slices with low-fat cheese slice  No sugar added greek yogurt  1-2 tbsp peanut butter  Palm size mixed nuts  3. Cut off water intake around 5pm. Can take little sips but avoid drinking large amounts before bedtime.  4. Continue to follow myplate guidelines at dinner time.  5. Continue to prioritize a lean protein source at each meal with a total daily goal of >100 g.  Eggs  Chicken  Greek yogurt  Beans  Peanut butter  Low-fat milk  Turkey  Fish  Shrimp  Tuna and chicken salad made with lower-fat mayonnaise  Nuts-cashews, peanuts, pecans  Protein powder  7. Continue exercising with resistance band and increase walking mileage to 1 mile 3-4x per week.  8. Portion control tips:  -consume protein and fruit/vegetable first during meals before carbohydrates  -always consume meals sitting at a table without screens or tablets or phones

## 2024-05-02 NOTE — Progress Notes (Signed)
 [facility]    Adult Medical Nutrition Therapy - Follow-Up Assessment/Care Plan      Date: 05/02/2024    Patient Name: Kelly Cervantes    Date of Birth / Age / Birth Gender: 1971/04/13 / 54 y.o. / Female    Referring MD or Clinic: Self, Referred    Interpreter: No    Reason for Referral: General, healthful diet order yes: weight management and weight reduction    Nutritional Concerns/Comments: 54 y.o. female reports to follow-up nutrition visit with BMI of 30.64 kg/m2 and  has a past medical history of Anemia (2010), Anxiety (2014), Arthritis (2019), Bipolar 1 disorder    (CMS-HCC) (04/13/2012), Clotting disorder (HHS-HCC) (2005), Depression (2009), HIV disease    (CMS-HCC) (2009), Hyperlipidemia (03/09/2023), Hypertension (2014), Neuromuscular disorder (CMS-HCC) (2022), and Obesity (2022). seeking nutritional counseling to mitigate elevated weight.     Since follow nutrition visit, pt reports adherence to nutrition-related recommendations but seems to have multiple barriers that could be impacting her inability to lose weight:  -increased stress from children health issues  -immense grief from passing of grandchild  -poor sleep hygiene  -all or nothing diet mentality  -mood disorder   -hormonal changes due to menopause    Pt would like to be considered for a weight-loss medication as she feels she has given multiple attempts to lose weight with no results.    Today, RD and pt discussed:  -strategies to reduce night-time awakening to eat  -high protein HS snack ideas  -stable meal schedule  -reducing fluids at night  -continuation of physical activity  -increasing total protein intake.     DIET: For breakfast, pt will have coffee and 2 pears. For dinner, pt will have chili beans with turkey.     PHYSICAL ACTIVITY: Pt walking more often, doing wall pilates (pt self-reports the use of resistance band given 0x in the past month).    FOOD INSECURITY: Pt screens positive. Pt receives $322 from [facility] program. Pt does not access a food pantry.     Anthropometric Data:    Height: 5'7  Wt: 196 lbs  BMI: 31.63 kg/m2    Weight History:  Wt Readings from Last 6 Encounters:   03/07/24 88.7 kg (195 lb 9.6 oz)   01/11/24 88.6 kg (195 lb 6.4 oz)   12/15/23 88.5 kg (195 lb 3.2 oz)   12/14/23 88.2 kg (194 lb 6.4 oz)   11/16/23 88 kg (194 lb)   10/26/23 88.5 kg (195 lb 3.2 oz)       Physical Findings Data:     BP Today: N/A (telehealth visit)  BP History:  BP Readings from Last 6 Encounters:   03/07/24 134/91   01/11/24 134/85   12/15/23 138/97   11/16/23 125/83   09/14/23 158/90   08/25/23 127/91       Biochemical Data:  A1C:    Hemoglobin A1C   Date Value Ref Range Status   06/01/2023 5.2 4.8 - 5.6 % Final     Estimated Blood Glucose:   Lab Results   Component Value Date    Estimated Average Glucose 103 06/01/2023     Total Cholesterol:   Cholesterol, Total   Date Value Ref Range Status   03/30/2023 148 <=200 mg/dL Final     Trig:   Triglycerides   Date Value Ref Range Status   03/30/2023 81 0 - 150 mg/dL Final     HDL:   Cholesterol, HDL   Date Value Ref  Range Status   03/30/2023 63 (H) 40 - 60 mg/dL Final      LDL:   Cholesterol, LDL, Calculated   Date Value Ref Range Status   03/30/2023 69 40 - 99 mg/dL Final     Comment:     NHLBI Recommended Ranges, LDL Cholesterol, for Adults (20+yrs) (ATPIII), mg/dL  Optimal              <899  Near Optimal        100-129  Borderline High     130-159  High                160-189  Very High            >=190  NHLBI Recommended Ranges, LDL Cholesterol, for Children (2-19 yrs), mg/dL  Desirable            <889  Borderline High     110-129  High                 >=130         Current Medications, Herbs, Supplements:    Current Outpatient Medications:     albuterol  HFA 90 mcg/actuation inhaler, Inhale 2 puffs every six (6) hours as needed., Disp: , Rfl:     amlodipine  (NORVASC ) 5 MG tablet, Take 1 tablet (5 mg total) by mouth daily., Disp: 30 tablet, Rfl: 11    atorvastatin  (LIPITOR) 20 MG tablet, Take 1 tablet (20 mg total) by mouth daily., Disp: 100 tablet, Rfl: 3    budesonide-formoterol (SYMBICORT ) 160-4.5 mcg/actuation inhaler, Inhale 2 puffs  in the morning and 2 puffs in the evening., Disp: 10.2 g, Rfl: 11    buPROPion  (WELLBUTRIN  XL) 300 MG 24 hr tablet, Take 1 tablet (300 mg total) by mouth daily., Disp: 90 tablet, Rfl: 0    cabotegravir -rilpivirine  (CABENUVA ) 600 mg-900 mg/3 mL extended-release injection, Inject 6 mL into the muscle every 8 weeks., Disp: 6 mL, Rfl: 6    celecoxib  (CELEBREX ) 200 MG capsule, Take 1 capsule (200 mg total) by mouth two (2) times a day as needed for pain., Disp: 60 capsule, Rfl: 2    celecoxib  (CELEBREX ) 200 MG capsule, Take 1 capsule (200 mg total) by mouth two (2) times a day as needed for pain., Disp: 60 capsule, Rfl: 0    FLUoxetine  (PROZAC ) 40 MG capsule, Take 2 capsules (80 mg total) by mouth daily., Disp: 180 capsule, Rfl: 0    galcanezumab -gnlm (EMGALITY ) 120 mg/mL injection, Inject 120 mg under the skin every twenty-eight (28) days., Disp: 3 mL, Rfl: 3    hydrOXYzine  (ATARAX ) 50 MG tablet, Take 1 tablet (50 mg total) by mouth two (2) times a day as needed for anxiety., Disp: 180 tablet, Rfl: 0    omeprazole  (PRILOSEC) 40 MG capsule, Take 1 capsule (40 mg total) by mouth two (2) times a day., Disp: 180 capsule, Rfl: 2    prazosin  (MINIPRESS ) 1 MG capsule, Take 3 capsules (3 mg total) by mouth nightly., Disp: 90 capsule, Rfl: 2    rimegepant (NURTEC ODT ) 75 mg TbDL, Take 1 tablet (75 mg total) by mouth once as needed at the onset of migraine. 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 For each statement, please tell me whether the statement was often true, sometimes true, or never true for your household in the 12 months.     1. ???We worried whether our food would run out before we got money to buy more.???   Sometimes True     2. ???The food that we bought just didn't last, and we didn't have money to get more.  Sometimes True       Usual Daily Food Choices:      24-Hour Recall/Usual Intake:  Time Intake   Breakfast 2 pears  coffee   Snack (AM)    Lunch    Snack (PM)    Dinner Chili beans (three types), lean ground beef   Snack (HS)          Behavioral Factors:  Overeating: No issues noted.  Emotional Eating: No issues noted.  Grazing: Frequently grazes chips, grapes, crackers.   Fast Eating: No issues noted.  Nighttime Eating: Nighttime snacking intermittent 2-4am. Pt sometimes is aware of this behavior, sometimes not.    Factors Affecting Food Intake:  Knowledge and Beliefs: Has improved adherence to nutrition-related recommendations.   She has willingness to try new foods.  Stress/Anxiety: Patient with medical history of depression., Patient with medical history of anxiety.   Sleep Patterns: Patient with obstructive sleep apnea. Using CPAP irregularly and only up to 3 hours. Pt notes multiple night-time wakenings, sometimes to eat.    Food Safety and Access: She noted limited finances and resources.  She has SNAP/EBT benefits.   Other: n/a      Food Intolerances/Dietary Restrictions:  No known food allergies or food intolerances.     Other GI Issues:  No issues noted.    Hunger and Satiety:  Denied issues.       Allergies:  is allergic to ibuprofen, naproxen sodium, and naproxen.      Usual Daily Physical Activity Factors: >/=1.0 to <1.4 (Sedentary)  Method for estimating physical activity factor: 2020-2025 DRI Guidelines    Estimated Adherence:  Client self-reported adherence score:10  Method for estimating adherence: Adult Meducation Readiness Ruler      NUTRITION DIAGNOSIS:   Inadequate intake of calories  Inadequate  intake of grains  Inadequate  intake of vegetables  Inadequate  intake of milk/milk products    Method for estimating nutritional adequacy: 2020-2025 DRI Guidelines     Clinical Diagnosis:   Overweight/obesity related to excessive calorie intake, increased night-time eating, irregular meal schedule evidenced by a BMI of 31.63 kg/m2 and inability to lose weight.     NUTRITION INTERVENTIONS/PLAN OF CARE    Nutrition Counseling   Nutrition Education     Coordination of care via Mail and Medical Record    Client agreed to follow interventions:  Yes       Nutrition-Related Medication Management: Nutrition-related complimentary/alternative medicine      Nutrition Goals:  Start wearing CPAP nightly at least for the  first 4 hours of sleep.   Replace 2 apples night snack with either of the following:  Sargento balanced break (cheese, nut, cranberry mix)  Low-sodium turkey slices with low-fat cheese slice  No sugar added greek yogurt  1-2 tbsp peanut butter  Palm size mixed nuts  3. Cut off water intake around 5pm. Can take little sips but avoid drinking large amounts before bedtime.  4. Continue to follow myplate guidelines at dinner time.  5. Continue to prioritize a lean protein source at each meal with a total daily goal of >100 g.  Eggs  Chicken  Greek yogurt  Beans  Peanut butter  Low-fat milk  Turkey  Fish  Shrimp  Tuna and chicken salad made with lower-fat mayonnaise  Nuts-cashews, peanuts, pecans  Protein powder  7. Continue exercising with resistance band and increase walking mileage to 1 mile 3-4x per week.  8. Portion control tips:  -consume protein and fruit/vegetable first during meals before carbohydrates  -always consume meals sitting at a table without screens or tablets or phones      Patient Education:  Individualized nutrition counseling and education provided on:   Protein needs, recommendation of sources  Benefits of a stable meal schedule    Materials Provided:  Patient instructions: Prominent nutrition related recommendations.   Handouts supporting dietary recommendations: n/a  Additional Materials: Written nutrition tips         NUTRITION MONITORING AND EVALUATION:  Follow-up appointment: in clinic March 4th at 130pm      Goals for next appointment:   1.Start wearing CPAP nightly at least for the first 4 hours of sleep.   2.Replace 2 apples night snack with either of the following:  S[place]ento balanced break (cheese, nut, cranberry mix)  Low-sodium turkey slices with low-fat cheese slice  No sugar added greek yogurt  1-2 tbsp peanut butter  Palm size mixed nuts  3. Cut off water intake around 5pm. Can take little sips but avoid drinking large amounts before bedtime.  4. Continue to follow myplate guidelines at dinner time.  5. Continue to prioritize a lean protein source at each meal with a total daily goal of >100 g.  Eggs  Chicken  Greek yogurt  Beans  Peanut butter  Low-fat milk  Turkey  Fish  Shrimp  Tuna and chicken salad made with lower-fat mayonnaise  Nuts-cashews, peanuts, pecans  Protein powder  7. Continue exercising with resistance band and increase walking mileage to 1 mile 3-4x per week.  8. Portion control tips:  -consume protein and fruit/vegetable first during meals before carbohydrates  -always consume meals sitting at a table without screens or tablets or phones    Length of visit was: 26 minutes    Signed:  Karilyn DELENA Areola  05/02/2024 1:42 PM

## 2024-05-02 NOTE — Progress Notes (Signed)
 Duration of Intervention: 60 minutes   Type of Contact: in-person   Reason for Contact: Patient in clinic for scheduled Cabenuva  injection for ongoing HIV-1 maintenance therapy.   Intervention: Patient presented for routine Cabenuva  injection. Discussed any changes since the last visit:   Mood:  No changes  Sleep: No Changes   Cabenuva  administered per protocol:   Medication: Cabotegravir  and Rilpivirine  Bi-Monthly   Needle size: 23G 1 1/2  Patient tolerated the injection well   Observed post-injection; no immediate adverse effects noted   Care Plan:   Continue Cabenuva  per established dosing interval   Next injection scheduled for: 06/27/2024.   Patient will not meet with the provider at the next visit   Monitor for side effects and assess mood/sleep changes at follow-ups   Reinforce treatment adherence to ??7-day dosing window      Electronically signed by:   Isaiah KANDICE Sharps, RN

## 2024-05-27 DIAGNOSIS — Z21 Asymptomatic human immunodeficiency virus [HIV] infection status: Principal | ICD-10-CM
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
## 2024-05-31 DIAGNOSIS — F4310 Post-traumatic stress disorder, unspecified: Principal | ICD-10-CM

## 2024-05-31 DIAGNOSIS — F329 Major depressive disorder, single episode, unspecified: Secondary | ICD-10-CM

## 2024-05-31 DIAGNOSIS — F411 Generalized anxiety disorder: Secondary | ICD-10-CM

## 2024-05-31 NOTE — Patient Instructions (Signed)
 Follow-up instructions:  - Please continue taking your medications as prescribed for your mental health.   - Do not make changes to your medications, including taking more or less than prescribed, unless under the supervision of your physician. Be aware that some medications may make you feel worse if abruptly stopped  - Please refrain from using illicit substances, as these can affect your mood and could cause anxiety or other concerning symptoms.   - Seek further medical care for any increase in symptoms or new symptoms such as thoughts of wanting to hurt yourself or hurt others.     Contact info:  Life-threatening emergencies: Call 911, the 988 suicide and crisis lifeline, or go to the nearest ER for medical or psychiatric attention.           Issues that need urgent attention but are not life threatening: Call the clinic outpatient front desk for assistance:    - [phone] for [facility] adult [facility] clinics located at [address] [facility]  - [phone] for [facility] child and adolescent psychiatry clinics located at [address]  - [phone] for [facility] and adult psychiatry clinics located at [address]    Non-urgent routine concerns and questions: Send a message through [facility] or call our clinic front desk.    Refill requests: Check with your pharmacy to initiate refill requests.    Regarding appointments:  - If you need to cancel your appointment, we ask that you call your clinic at least 24 hours before your scheduled appointment at the numbers listed above.  - If for any reason you arrive 15 minutes later than your scheduled appointment time, you may not be seen and your visit may be rescheduled.  - Please remember that we will not automatically reschedule missed appointments.  - If you miss two (2) appointments without letting us know in advance, you will likely be referred to a provider in your community.  - We will do our best to be on time. Sometimes an emergency will arise that might cause your clinician to be late. We will try to inform you of this when you check in for your appointment. If you wait more than 15 minutes past your appointment time without such notice, please speak with the front desk staff.    In the event of bad weather, the clinic staff will attempt to contact you, should your appointment need to be rescheduled. Additionally, you can call the Patient Weather Line [phone] for system-wide clinic status    For more information and reminders regarding clinic policies (these were provided when you were admitted to the clinic), please ask the front desk.
